# Patient Record
Sex: Female | Born: 1940 | Race: White | Hispanic: No | Marital: Married | Smoking: Never smoker
Health system: Southern US, Community
[De-identification: ages and names within clinical notes are randomized; demographics above are authoritative.]

## PROBLEM LIST (undated history)

## (undated) DIAGNOSIS — E7800 Pure hypercholesterolemia, unspecified: Secondary | ICD-10-CM

## (undated) DIAGNOSIS — E785 Hyperlipidemia, unspecified: Secondary | ICD-10-CM

## (undated) DIAGNOSIS — N809 Endometriosis, unspecified: Secondary | ICD-10-CM

## (undated) DIAGNOSIS — E039 Hypothyroidism, unspecified: Secondary | ICD-10-CM

## (undated) DIAGNOSIS — L97909 Non-pressure chronic ulcer of unspecified part of unspecified lower leg with unspecified severity: Secondary | ICD-10-CM

## (undated) DIAGNOSIS — I83009 Varicose veins of unspecified lower extremity with ulcer of unspecified site: Secondary | ICD-10-CM

## (undated) DIAGNOSIS — M549 Dorsalgia, unspecified: Secondary | ICD-10-CM

## (undated) HISTORY — DX: Pure hypercholesterolemia, unspecified: E78.00

## (undated) HISTORY — PX: HERNIA REPAIR: SHX51

## (undated) HISTORY — DX: Dorsalgia, unspecified: M54.9

## (undated) HISTORY — PX: ROTATOR CUFF REPAIR: SHX139

## (undated) HISTORY — DX: Endometriosis, unspecified: N80.9

## (undated) HISTORY — PX: APPENDECTOMY: SHX54

## (undated) HISTORY — DX: Hypothyroidism, unspecified: E03.9

## (undated) HISTORY — PX: THYROIDECTOMY: SHX17

## (undated) HISTORY — DX: Varicose veins of unspecified lower extremity with ulcer of unspecified site: I83.009

## (undated) HISTORY — PX: HEMORRHOID SURGERY: SHX153

## (undated) HISTORY — DX: Hyperlipidemia, unspecified: E78.5

## (undated) HISTORY — PX: CYSTOSCOPY: SUR368

## (undated) HISTORY — PX: ABDOMINAL HYSTERECTOMY: SHX81

## (undated) HISTORY — DX: Non-pressure chronic ulcer of unspecified part of unspecified lower leg with unspecified severity: L97.909

---

## 1997-11-18 DIAGNOSIS — I83009 Varicose veins of unspecified lower extremity with ulcer of unspecified site: Secondary | ICD-10-CM

## 1997-11-18 HISTORY — DX: Varicose veins of unspecified lower extremity with ulcer of unspecified site: I83.009

## 1997-12-27 ENCOUNTER — Encounter: Payer: Self-pay | Admitting: Family Medicine

## 1997-12-27 LAB — CONVERTED CEMR LAB: WBC, blood: 5.9 10*3/uL

## 1999-07-19 LAB — HM MAMMOGRAPHY: HM Mammogram: NORMAL

## 2001-07-20 ENCOUNTER — Encounter: Payer: Self-pay | Admitting: Family Medicine

## 2001-08-06 ENCOUNTER — Encounter: Payer: Self-pay | Admitting: Family Medicine

## 2003-07-21 ENCOUNTER — Encounter: Payer: Self-pay | Admitting: Family Medicine

## 2003-07-21 LAB — CONVERTED CEMR LAB: Blood Glucose, Fasting: 66 mg/dL

## 2004-03-11 ENCOUNTER — Ambulatory Visit: Payer: Self-pay | Admitting: Family Medicine

## 2004-03-12 ENCOUNTER — Encounter: Payer: Self-pay | Admitting: Family Medicine

## 2004-03-12 LAB — CONVERTED CEMR LAB
RBC count: 3.89 10*6/uL
TSH: 0.75 microintl units/mL
WBC, blood: 5.1 10*3/uL

## 2004-11-13 ENCOUNTER — Ambulatory Visit: Payer: Self-pay | Admitting: Family Medicine

## 2005-10-19 ENCOUNTER — Ambulatory Visit: Payer: Self-pay | Admitting: Family Medicine

## 2006-02-11 ENCOUNTER — Ambulatory Visit: Payer: Self-pay | Admitting: Family Medicine

## 2006-02-25 ENCOUNTER — Ambulatory Visit: Payer: Self-pay | Admitting: Family Medicine

## 2007-09-04 ENCOUNTER — Telehealth: Payer: Self-pay | Admitting: Family Medicine

## 2007-09-08 ENCOUNTER — Encounter: Payer: Self-pay | Admitting: Family Medicine

## 2007-09-08 DIAGNOSIS — E890 Postprocedural hypothyroidism: Secondary | ICD-10-CM

## 2007-09-08 DIAGNOSIS — I872 Venous insufficiency (chronic) (peripheral): Secondary | ICD-10-CM | POA: Insufficient documentation

## 2007-09-11 DIAGNOSIS — G43909 Migraine, unspecified, not intractable, without status migrainosus: Secondary | ICD-10-CM

## 2007-09-11 DIAGNOSIS — E7800 Pure hypercholesterolemia, unspecified: Secondary | ICD-10-CM

## 2007-09-12 ENCOUNTER — Ambulatory Visit: Payer: Self-pay | Admitting: Family Medicine

## 2007-10-23 ENCOUNTER — Ambulatory Visit: Payer: Self-pay | Admitting: Family Medicine

## 2008-01-24 ENCOUNTER — Telehealth: Payer: Self-pay | Admitting: Family Medicine

## 2008-01-26 ENCOUNTER — Telehealth: Payer: Self-pay | Admitting: Family Medicine

## 2009-01-06 ENCOUNTER — Telehealth: Payer: Self-pay | Admitting: Family Medicine

## 2009-08-20 ENCOUNTER — Encounter (INDEPENDENT_AMBULATORY_CARE_PROVIDER_SITE_OTHER): Payer: Self-pay | Admitting: *Deleted

## 2009-11-06 ENCOUNTER — Ambulatory Visit: Payer: Self-pay | Admitting: Internal Medicine

## 2009-11-14 ENCOUNTER — Ambulatory Visit: Payer: Self-pay | Admitting: Internal Medicine

## 2009-12-31 ENCOUNTER — Ambulatory Visit: Payer: Self-pay | Admitting: Family Medicine

## 2010-02-18 NOTE — Letter (Signed)
Summary: [name] letter  [facility]  [address] [place], [place] [address]   Phone: [phone]  Fax: [phone]       08/20/2009 MRN: [id number]  [name] [address] [place], [place]  [address]  Dear Ms. [facility] [place], and [place] announce the retirement of Arta Silence, M.D., from full-time practice at the [facility] office effective July 17, 2009 and his plans of returning part-time.  It is important to Dr. Hetty Ely and to our practice that you understand that [facility] has seven physicians in our office for your health care needs.  We will continue to offer the same exceptional care that you have today.    Dr. Hetty Ely has spoken to many of you about his plans for retirement and returning part-time in the fall.   We will continue to work with you through the transition to schedule appointments for you in the office and meet the high standards that [place] is committed to.   Again, it is with great pleasure that we share the news that Dr. Hetty Ely will return to [facility] in October of 2011 with a reduced schedule.    If you have any questions, or would like to request an appointment with one of our physicians, please call us at [phone] and press the option for Scheduling an appointment.  We take pleasure in providing you with excellent patient care and look forward to seeing you at your next office visit.  Our [facility] Physicians are:  Tillman Abide, M.D. Laurita Quint, M.D. Roxy Manns, M.D. Kerby Nora, M.D. Hannah Beat, M.D. Ruthe Mannan, M.D. We proudly welcomed Raechel Ache, M.D. and Eustaquio Boyden, M.D. to the practice in July/August 2011.  Sincerely,   [facility]

## 2010-02-19 NOTE — Assessment & Plan Note (Signed)
Summary: LEFT ARM PAIN / LFW   Vital Signs:  Patient profile:   70 year old female Weight:      192.25 pounds BMI:     29.12 Temp:     97.1 degrees F oral Pulse rate:   72 / minute Pulse rhythm:   regular BP sitting:   126 / 70  (left arm) Cuff size:   regular  Vitals Entered By: Sydell Axon LPN (December 31, 2009 11:21 AM) CC: Left arm pain X  2-3 weeks   History of Present Illness: Pt here for shoulder probs.  She sees Dr Barnabas Lister in Jul for her physcal...he checks her cholesterol and thyroid. Her lab paperwork is at home. Dr Barnabas Lister prescribes all of her medication. Her left arm has hurt in the bicep area for about two weeks. She denies trauma or injury but has a resolving bruise on her left tricep area from hitting the bureau at night in the dark.  Problems Prior to Update: 1)  Back Strain, Left Lat Dorsi  (ICD-847.9) 2)  Common Migraine  (ICD-346.10) 3)  Unspecified Venous Insufficiency  (ICD-459.81) 4)  Hypercholesterolemia  (ICD-272.0) 5)  Hypothyroidism  (ICD-244.9)  Medications Prior to Update: 1)  Cyclobenzaprine Hcl 10 Mg  Tabs (Cyclobenzaprine Hcl) .[name].. 1 Tablet Three Times A Day As Need By Mouth 2)  Diclofenac Sodium 50 Mg  Tbec (Diclofenac Sodium) .[name].. 1 Tablet Twice A Day By Mouth 3)  Synthroid 125 Mcg Tabs (Levothyroxine Sodium) .[name].. 1 Daily By Mouth 4)  Multivitamins  Tabs (Multiple Vitamin) .[name].. 1 Daily By Mouth 5)  Atenolol 50 Mg Tabs (Atenolol) .[name].. 1 Daily By Mouth 6)  Simvastatin 80 Mg Tabs (Simvastatin) .[name].. 1 Daily At Bedtime By Mouth  Current Medications (verified): 1)  Cyclobenzaprine Hcl 10 Mg  Tabs (Cyclobenzaprine Hcl) .[name].. 1 Tablet Three Times A Day As Need By Mouth 2)  Diclofenac Sodium 50 Mg  Tbec (Diclofenac Sodium) .[name].. 1 Tablet Twice A Day By Mouth 3)  Synthroid 125 Mcg Tabs (Levothyroxine Sodium) .[name].. 1 Daily By Mouth 4)  Multivitamins  Tabs (Multiple Vitamin) .[name].. 1 Daily By Mouth 5)  Atenolol 50 Mg Tabs (Atenolol) .[name].. 1 Daily By  Mouth 6)  Simvastatin 80 Mg Tabs (Simvastatin) .[name].. 1 Daily At Bedtime By Mouth 7)  Aspirin 81 Mg Tbec (Aspirin) .... Take One By Mouth Daily  Allergies: 1)  ! * Codeine  Physical Exam  General:  Well-developed,well-nourished,in no acute distress; alert,appropriate and cooperative throughout examination, nontoxic. Head:  Normocephalic and atraumatic without obvious abnormalities. No apparent alopecia or balding. Eyes:  Conjunctiva clear bilaterally.  Ears:  External ear exam shows no significant lesions or deformities.  Otoscopic examination reveals clear canals, tympanic membranes are intact bilaterally without bulging, retraction, inflammation or discharge. Hearing is grossly normal bilaterally. Nose:  External nasal examination shows no deformity or inflammation. Nasal mucosa are pink and moist without lesions or exudates. Mouth:  Oral mucosa and oropharynx without lesions or exudates.  Teeth in good repair. Neck:  No deformities, masses, or tenderness noted. Lungs:  Normal respiratory effort, chest expands symmetrically. Lungs are clear to auscultation, no crackles or wheezes. Heart:  Normal rate and regular rhythm. S1 and S2 normal without gallop, murmur, click, rub or other extra sounds. Extremities:  No clubbing, cyanosis, edema, or deformity noted with normal full range of motion of all joints except left arm hurts with posterior adduction and "hip pocket"maneuver..   Neurologic:  Sensation and light touch nml of left arm.  Impression & Recommendations:  Problem # 1:  PAIN IN SOFT TISSUES OF L UPPER ARM (ICD-729.5) Assessment New Appeaqrs to be acute muscle starin of distal fibers of left bicep. See instructions.  Complete Medication List: 1)  Cyclobenzaprine Hcl 10 Mg Tabs (Cyclobenzaprine hcl) .[name].. 1 tablet three times a day as need by mouth 2)  Diclofenac Sodium 50 Mg Tbec (Diclofenac sodium) .[name].. 1 tablet twice a day by mouth 3)  Synthroid 125 Mcg Tabs (Levothyroxine  sodium) .[name].. 1 daily by mouth 4)  Multivitamins Tabs (Multiple vitamin) .[name].. 1 daily by mouth 5)  Atenolol 50 Mg Tabs (Atenolol) .[name].. 1 daily by mouth 6)  Simvastatin 80 Mg Tabs (Simvastatin) .[name].. 1 daily at bedtime by mouth 7)  Aspirin 81 Mg Tbec (Aspirin) .... Take one by mouth daily  Patient Instructions: 1)  Take Aleve 2 tabs after brfst and supper for about two weeks. 2)  may also take Tyl as needed.  3)  Heaqt in AM and throughout the day as able. Ice before bedtime. 4)  RTC if sxs worsen.   Orders Added: 1)  Est. Patient Level III [91478]    Current Allergies (reviewed today): ! * CODEINE

## 2010-03-15 ENCOUNTER — Ambulatory Visit: Payer: Self-pay | Admitting: Specialist

## 2010-03-20 ENCOUNTER — Encounter: Payer: Self-pay | Admitting: Family Medicine

## 2010-03-31 NOTE — Letter (Signed)
Summary: [facility]   Imported By: Maryln Gottron 03/26/2010 15:24:30  _____________________________________________________________________  External Attachment:    Type:   Image     Comment:   External Document

## 2010-04-28 ENCOUNTER — Ambulatory Visit: Payer: Self-pay | Admitting: Specialist

## 2010-05-07 ENCOUNTER — Ambulatory Visit: Payer: Self-pay | Admitting: Specialist

## 2010-08-14 ENCOUNTER — Other Ambulatory Visit: Payer: Self-pay | Admitting: *Deleted

## 2010-08-14 MED ORDER — BUTALBITAL-APAP-CAFFEINE 50-325-40 MG PO TABS: ORAL_TABLET | ORAL | Status: DC

## 2010-08-14 NOTE — Telephone Encounter (Signed)
Rx called to pharmacy

## 2010-08-14 NOTE — Telephone Encounter (Signed)
Patient has a new patient appt on 08/31/2010 with Dr. Dayton Martes.  Pharmacy requesting a refill until her appt.  Please advise.

## 2010-08-14 NOTE — Telephone Encounter (Signed)
Ok to refill one month supply. 

## 2010-08-17 ENCOUNTER — Other Ambulatory Visit: Payer: Self-pay | Admitting: Family Medicine

## 2010-08-17 DIAGNOSIS — Z0000 Encounter for general adult medical examination without abnormal findings: Secondary | ICD-10-CM

## 2010-08-17 DIAGNOSIS — E7800 Pure hypercholesterolemia, unspecified: Secondary | ICD-10-CM

## 2010-08-17 DIAGNOSIS — E039 Hypothyroidism, unspecified: Secondary | ICD-10-CM

## 2010-08-17 DIAGNOSIS — Z79899 Other long term (current) drug therapy: Secondary | ICD-10-CM

## 2010-08-25 ENCOUNTER — Encounter: Payer: Self-pay | Admitting: Family Medicine

## 2010-08-25 ENCOUNTER — Other Ambulatory Visit (INDEPENDENT_AMBULATORY_CARE_PROVIDER_SITE_OTHER): Payer: Medicare Other | Admitting: Family Medicine

## 2010-08-25 DIAGNOSIS — E7800 Pure hypercholesterolemia, unspecified: Secondary | ICD-10-CM

## 2010-08-25 DIAGNOSIS — Z0000 Encounter for general adult medical examination without abnormal findings: Secondary | ICD-10-CM

## 2010-08-25 DIAGNOSIS — Z79899 Other long term (current) drug therapy: Secondary | ICD-10-CM

## 2010-08-25 DIAGNOSIS — F1910 Other psychoactive substance abuse, uncomplicated: Secondary | ICD-10-CM

## 2010-08-25 DIAGNOSIS — E039 Hypothyroidism, unspecified: Secondary | ICD-10-CM

## 2010-08-25 LAB — BASIC METABOLIC PANEL
Calcium: 8.7 mg/dL (ref 8.4–10.5)
Creatinine, Ser: 0.8 mg/dL (ref 0.4–1.2)
GFR: 81.22 mL/min (ref >60.00)
Sodium: 140 mEq/L (ref 135–145)

## 2010-08-25 LAB — HEPATIC FUNCTION PANEL
ALT: 35 U/L (ref 0–35)
AST: 37 U/L (ref 0–37)
Albumin: 4 g/dL (ref 3.5–5.2)
Alkaline Phosphatase: 89 U/L (ref 39–117)
Total Protein: 7.3 g/dL (ref 6.0–8.3)

## 2010-08-25 LAB — LIPID PANEL
Cholesterol: 142 mg/dL (ref 0–200)
HDL: 56.6 mg/dL (ref >39.00)
Triglycerides: 151 mg/dL — ABNORMAL HIGH (ref 0.0–149.0)

## 2010-08-31 ENCOUNTER — Encounter: Payer: Self-pay | Admitting: Family Medicine

## 2010-08-31 ENCOUNTER — Ambulatory Visit: Payer: Self-pay | Admitting: Family Medicine

## 2010-08-31 ENCOUNTER — Ambulatory Visit (INDEPENDENT_AMBULATORY_CARE_PROVIDER_SITE_OTHER): Payer: Medicare Other | Admitting: Family Medicine

## 2010-08-31 VITALS — BP 130/90 | HR 72 | Temp 97.7°F | Ht 68.0 in | Wt 185.5 lb

## 2010-08-31 DIAGNOSIS — R300 Dysuria: Secondary | ICD-10-CM

## 2010-08-31 DIAGNOSIS — Z0100 Encounter for examination of eyes and vision without abnormal findings: Secondary | ICD-10-CM

## 2010-08-31 DIAGNOSIS — Z1231 Encounter for screening mammogram for malignant neoplasm of breast: Secondary | ICD-10-CM

## 2010-08-31 DIAGNOSIS — Z0110 Encounter for examination of ears and hearing without abnormal findings: Secondary | ICD-10-CM

## 2010-08-31 DIAGNOSIS — Z0000 Encounter for general adult medical examination without abnormal findings: Secondary | ICD-10-CM

## 2010-08-31 DIAGNOSIS — E785 Hyperlipidemia, unspecified: Secondary | ICD-10-CM

## 2010-08-31 DIAGNOSIS — E039 Hypothyroidism, unspecified: Secondary | ICD-10-CM

## 2010-08-31 DIAGNOSIS — E7800 Pure hypercholesterolemia, unspecified: Secondary | ICD-10-CM

## 2010-08-31 LAB — POCT URINALYSIS DIPSTICK
Bilirubin, UA: NEGATIVE
Blood, UA: NEGATIVE
Glucose, UA: NEGATIVE
Leukocytes, UA: NEGATIVE
Nitrite, UA: NEGATIVE

## 2010-08-31 MED ORDER — SIMVASTATIN 40 MG PO TABS: 40.0000 mg | ORAL_TABLET | Freq: Every day | ORAL | Status: DC

## 2010-08-31 MED ORDER — CYCLOBENZAPRINE HCL 5 MG PO TABS: 5.0000 mg | ORAL_TABLET | Freq: Three times a day (TID) | ORAL | Status: DC | PRN

## 2010-08-31 NOTE — Progress Notes (Signed)
Addended by: Dianne Dun on: 08/31/2010 11:27 AM   Modules accepted: Orders

## 2010-08-31 NOTE — Progress Notes (Signed)
Subjective:    Patient ID: [name], female    DOB: May 29, 1940, 70 y.o.   MRN: [id number]  HPI  Pt of Dr. Lorenza Chick here to establish care and for medicare AWV.  Only complaint is some myaglias fatigue with increased dose of Zocor 80 mg daily. LDL very low at 55.  I have personally reviewed the Medicare Annual Wellness questionnaire and have noted 1. The patient's medical and social history 2. Their use of alcohol, tobacco or illicit drugs 3. Their current medications and supplements 4. The patient's functional ability including ADL's, fall risks, home safety risks and hearing or visual             impairment. 5. Diet and physical activities 6. Evidence for depression or mood disorders  Patient Active Problem List  Diagnoses  . HYPOTHYROIDISM  . HYPERCHOLESTEROLEMIA  . COMMON MIGRAINE  . UNSPECIFIED VENOUS INSUFFICIENCY  . Routine general medical examination at a health care facility   Past Medical History  Diagnosis Date  . Venous stasis ulcer 11/1997    Wound care center, [place]   Past Surgical History  Procedure Date  . Abdominal hysterectomy     1 1/2 ovaries gone, second to endometriosis  . Appendectomy   . Thyroidectomy    History  Substance Use Topics  . Smoking status: Not on file  . Smokeless tobacco: Not on file  . Alcohol Use:    Family History  Problem Relation Age of Onset  . Stroke Mother   . Cancer Sister     stomach   Allergies  Allergen Reactions  . Codeine     REACTION: NAUSEA AND VOMITING   Current Outpatient Prescriptions on File Prior to Visit  Medication Sig Dispense Refill  . aspirin 81 MG tablet Take 81 mg by mouth daily.        [name] atenolol (TENORMIN) 50 MG tablet Take 50 mg by mouth daily.        . butalbital-acetaminophen-caffeine (FIORICET) 50-325-40 MG per tablet Take one tablet by mouth every 4 hours as needed.  Do not exceed 6 tablets per day  20 tablet  0  . levothyroxine (SYNTHROID, LEVOTHROID) 125 MCG tablet  Take 125 mcg by mouth daily.        . Multiple Vitamin (MULTIVITAMIN) tablet Take 1 tablet by mouth daily.        . simvastatin (ZOCOR) 80 MG tablet Take 80 mg by mouth at bedtime.         The PMH, PSH, Social History, Family History, Medications, and allergies have been reviewed in [facility], and have been updated if relevant.    Review of Systems See HPI Patient reports no  vision/ hearing changes,anorexia, weight change, fever ,adenopathy, persistant / recurrent hoarseness, swallowing issues, chest pain, edema,persistant / recurrent cough, hemoptysis, dyspnea(rest, exertional, paroxysmal nocturnal), gastrointestinal  bleeding (melena, rectal bleeding), abdominal pain, excessive heart burn, GU symptoms(dysuria, hematuria, pyuria, voiding/incontinence  Issues) syncope, focal weakness, severe memory loss, concerning skin lesions, depression, anxiety, abnormal bruising/bleeding, major joint swelling, breast masses or abnormal vaginal bleeding.       Objective:   Physical Exam BP 130/90  Pulse 72  Temp(Src) 97.7 F (36.5 C) (Oral)  Ht 5\' 8"  (1.727 m)  Wt 185 lb 8 oz (84.142 kg)  BMI 28.21 kg/m2   General:  Well-developed,well-nourished,in no acute distress; alert,appropriate and cooperative throughout examination Head:  normocephalic and atraumatic.   Eyes:  vision grossly intact, pupils equal, pupils round, and pupils reactive to  light.   Ears:  R ear normal and L ear normal.   Nose:  no external deformity.   Mouth:  good dentition.   Neck:  No deformities, masses, or tenderness noted. Lungs:  Normal respiratory effort, chest expands symmetrically. Lungs are clear to auscultation, no crackles or wheezes. Heart:  Normal rate and regular rhythm. S1 and S2 normal without gallop, murmur, click, rub or other extra sounds. Abdomen:  Bowel sounds positive,abdomen soft and non-tender without masses, organomegaly or hernias noted. Msk:  No deformity or scoliosis noted of thoracic or lumbar spine.     Extremities:  No clubbing, cyanosis, edema, or deformity noted with normal full range of motion of all joints.   Neurologic:  alert & oriented X3 and gait normal.   Skin:  Intact without suspicious lesions or rashes Cervical Nodes:  No lymphadenopathy noted Axillary Nodes:  No palpable lymphadenopathy Psych:  Cognition and judgment appear intact. Alert and cooperative with normal attention span and concentration. No apparent delusions, illusions, hallucinations       Assessment & Plan:   1. Routine general medical examination at a health care facility  The patients weight, height, BMI and visual acuity have been recorded in the chart I have made referrals, counseling and provided education to the patient based review of the above and I have provided the pt with a written personalized care plan for preventive services.  IFOB ordered today.  Orders Placed This Encounter  Procedures  . MM Digital Screening    Standing Status: Future     Number of Occurrences:      Standing Expiration Date: 10/31/2011    Order Specific Question:  Reason for exam:    Answer:  screening mammogram    Order Specific Question:  Preferred imaging location?    Answer:  External     Comments:  [facility]  . POCT urinalysis dipstick      2. HYPERCHOLESTEROLEMIA   LDL quite low and pt complaining of myalgias. Will d/c Zocor 80 mg (cost is also an issue) and place on Simvastatin 40 mg daily. Pt to repeat  Labs in 3-6 months. Discussed elevated TG and glucose.  See pt instructions for details.  3. HYPOTHYROIDISM  Stable on current dose of Synthroid.

## 2010-08-31 NOTE — Patient Instructions (Addendum)
Great to see you. Please stop taking the Zocor, start taking Simvastatin 40 mg daily.  Please stop by to see Shirlee Limerick on your way out.  Triglycerides are a little high.  Decrease added sugars, eliminate trans fats, increase fiber and limit alcohol.  All these changes together can drop triglycerides by almost 50%.

## 2010-09-03 ENCOUNTER — Other Ambulatory Visit: Payer: Medicare Other

## 2010-09-07 ENCOUNTER — Other Ambulatory Visit: Payer: Self-pay | Admitting: Family Medicine

## 2010-09-07 ENCOUNTER — Encounter: Payer: Self-pay | Admitting: Gastroenterology

## 2010-09-07 DIAGNOSIS — Z1211 Encounter for screening for malignant neoplasm of colon: Secondary | ICD-10-CM

## 2010-09-07 DIAGNOSIS — R195 Other fecal abnormalities: Secondary | ICD-10-CM | POA: Insufficient documentation

## 2010-09-28 ENCOUNTER — Encounter: Payer: Self-pay | Admitting: Family Medicine

## 2010-09-29 ENCOUNTER — Encounter: Payer: Self-pay | Admitting: *Deleted

## 2010-09-30 ENCOUNTER — Encounter: Payer: Self-pay | Admitting: Gastroenterology

## 2010-09-30 ENCOUNTER — Ambulatory Visit (INDEPENDENT_AMBULATORY_CARE_PROVIDER_SITE_OTHER): Payer: Medicare Other | Admitting: Gastroenterology

## 2010-09-30 VITALS — BP 130/70 | HR 72 | Ht 68.0 in | Wt 186.8 lb

## 2010-09-30 DIAGNOSIS — K5900 Constipation, unspecified: Secondary | ICD-10-CM

## 2010-09-30 DIAGNOSIS — R195 Other fecal abnormalities: Secondary | ICD-10-CM

## 2010-09-30 MED ORDER — NA SULFATE-K SULFATE-MG SULF 17.5-3.13-1.6 GM/177ML PO SOLN: ORAL | Status: DC

## 2010-09-30 NOTE — Progress Notes (Signed)
History of Present Illness: This is a 70 year old female here today with her husband. She has problems with chronic constipation that is generally well controlled on MiraLax. She was recently found to have Hemoccult-positive stools and she is referred for further evaluation. She previously underwent colonoscopy by Dr. Maryruth Bun at [facility] [place] in August 2003 and that procedure was normal. Denies weight loss, abdominal pain, diarrhea, change in stool caliber, melena, hematochezia, nausea, vomiting, dysphagia, reflux symptoms, chest pain.  Past Medical History  Diagnosis Date  . Venous stasis ulcer 11/1997    Wound care center, [place]  . Hypothyroidism   . Hypercholesteremia   . Back pain   . Migraine    Past Surgical History  Procedure Date  . Abdominal hysterectomy     1 1/2 ovaries gone, second to endometriosis  . Appendectomy   . Thyroidectomy   . Hernia repair   . Rotator cuff repair     reports that she has never smoked. She does not have any smokeless tobacco history on file. She reports that she does not drink alcohol or use illicit drugs. family history includes Heart attack in her brother and father; Stomach cancer in her sister; and Stroke in her mother.  There is no history of Colon cancer. Allergies  Allergen Reactions  . Codeine     REACTION: NAUSEA AND VOMITING   Outpatient Encounter Prescriptions as of 09/30/2010  Medication Sig Dispense Refill  . aspirin 81 MG tablet Take 81 mg by mouth daily.        [name] atenolol (TENORMIN) 50 MG tablet Take 50 mg by mouth daily.        . butalbital-acetaminophen-caffeine (FIORICET) 50-325-40 MG per tablet Take one tablet by mouth every 4 hours as needed.  Do not exceed 6 tablets per day  20 tablet  0  . cyclobenzaprine (FLEXERIL) 5 MG tablet Take 1 tablet (5 mg total) by mouth every 8 (eight) hours as needed for muscle spasms.  30 tablet  1  . docusate sodium (COLACE) 100 MG capsule Take 100 mg by mouth daily.        [name] levothyroxine (SYNTHROID,  LEVOTHROID) 125 MCG tablet Take 125 mcg by mouth daily.        . Multiple Vitamin (MULTIVITAMIN) tablet Take 1 tablet by mouth daily.        . polyethylene glycol powder (MIRALAX) powder Take 17 g by mouth every other day.        . simvastatin (ZOCOR) 40 MG tablet Take 1 tablet (40 mg total) by mouth at bedtime.  90 tablet  3  . SUMAtriptan (IMITREX) 20 MG/ACT nasal spray Place 1 spray into the nose as needed.        . Na Sulfate-K Sulfate-Mg Sulf SOLN Use as directed  1 Bottle  0   Review of Systems: Pertinent positive and negative review of systems were noted in the above HPI section. All other review of systems were otherwise negative.  Physical Exam: General: Well developed , well nourished, no acute distress Head: Normocephalic and atraumatic Eyes:  sclerae anicteric, EOMI Ears: Normal auditory acuity Mouth: No deformity or lesions Neck: Supple, no masses or thyromegaly Lungs: Clear throughout to auscultation Heart: Regular rate and rhythm; no murmurs, rubs or bruits Abdomen: Soft, non tender and non distended. No masses, hepatosplenomegaly or hernias noted. Normal Bowel sounds Rectal: Deferred to colonoscopy Musculoskeletal: Symmetrical with no gross deformities  Skin: No lesions on visible extremities Pulses:  Normal pulses noted Extremities: No clubbing,  cyanosis, edema or deformities noted Neurological: Alert oriented x 4, grossly nonfocal Cervical Nodes:  No significant cervical adenopathy Inguinal Nodes: No significant inguinal adenopathy Psychological:  Alert and cooperative. Normal mood and affect  Assessment and Recommendations:  1. Hemoccult-positive stool. Rule out colorectal neoplasms. The risks, benefits, and alternatives to colonoscopy with possible biopsy and possible polypectomy were discussed with the patient and they consent to proceed. Given her sedation requirements at her prior colonoscopy and per her request will proceed with propofol sedation.  2. Chronic  constipation. Long-term high fiber diet with increased daily water intake. May use MiraLax 1-3 times each day titrated for adequate bowel movements. May continue to use Colace daily if it is needed in addition to MiraLax.

## 2010-09-30 NOTE — Patient Instructions (Signed)
You have been scheduled for a colonoscopy. Please follow written instructions given to you at your visit today.  Please pick up your Suprep kit at the pharmacy within the next 2-3 days. CC: Dr Ruthe Mannan

## 2010-10-20 ENCOUNTER — Ambulatory Visit (AMBULATORY_SURGERY_CENTER): Payer: Medicare Other | Admitting: Gastroenterology

## 2010-10-20 ENCOUNTER — Encounter: Payer: Self-pay | Admitting: Gastroenterology

## 2010-10-20 DIAGNOSIS — R195 Other fecal abnormalities: Secondary | ICD-10-CM

## 2010-10-20 DIAGNOSIS — K5900 Constipation, unspecified: Secondary | ICD-10-CM

## 2010-10-20 MED ORDER — SODIUM CHLORIDE 0.9 % IV SOLN: 500.0000 mL | INTRAVENOUS | Status: DC

## 2010-10-20 NOTE — Patient Instructions (Signed)
Green and blue discharge instructions reviewed with patient and care partner.  Impressions/recommendations:  Normal colon  Repeat colonoscopy exam in 10 years.  Resume medications as you were taking them prior to your procedure.

## 2010-10-21 ENCOUNTER — Telehealth: Payer: Self-pay | Admitting: *Deleted

## 2010-10-21 NOTE — Telephone Encounter (Signed)
Follow up Call- Patient questions:  Do you have a fever, pain , or abdominal swelling? yes Pain Score  2 *  Have you tolerated food without any problems? yes  Have you been able to return to your normal activities? yes  Do you have any questions about your discharge instructions: Diet   no Medications  no Follow up visit  no  Do you have questions or concerns about your Care? no  Actions: * If pain score is 4 or above: No action needed, pain <4. Patient complained of "gas" last night and again this morning with passage of air and relief. Pain below a 4. Instructed to call with any  Concerns, worsening pain or questions.

## 2010-10-26 ENCOUNTER — Other Ambulatory Visit: Payer: Self-pay | Admitting: *Deleted

## 2010-10-26 NOTE — Telephone Encounter (Signed)
Pt is requesting a refill on fioricet, previously prescribed by Dr. Barnabas Lister.  Uses [facility].

## 2010-10-27 MED ORDER — BUTALBITAL-APAP-CAFFEINE 50-325-40 MG PO TABS: ORAL_TABLET | ORAL | Status: DC

## 2010-10-27 NOTE — Telephone Encounter (Signed)
Rx called to [facility], patient notified via telephone.

## 2010-11-04 ENCOUNTER — Other Ambulatory Visit: Payer: Medicare Other | Admitting: Gastroenterology

## 2010-11-26 ENCOUNTER — Encounter: Payer: Self-pay | Admitting: Family Medicine

## 2010-11-26 ENCOUNTER — Ambulatory Visit (INDEPENDENT_AMBULATORY_CARE_PROVIDER_SITE_OTHER): Payer: Medicare Other | Admitting: Family Medicine

## 2010-11-26 VITALS — BP 130/80 | HR 60 | Temp 97.7°F | Ht 68.0 in | Wt 191.0 lb

## 2010-11-26 DIAGNOSIS — J069 Acute upper respiratory infection, unspecified: Secondary | ICD-10-CM

## 2010-11-26 MED ORDER — CYCLOBENZAPRINE HCL 5 MG PO TABS: 5.0000 mg | ORAL_TABLET | Freq: Three times a day (TID) | ORAL | Status: AC | PRN

## 2010-11-26 NOTE — Progress Notes (Signed)
SUBJECTIVE:  [name] is a 70 y.o. female who complains of coryza, congestion, sneezing, sore throat and dry cough for 2 days. She denies a history of anorexia, chest pain, fatigue, fevers, myalgias, nausea and shortness of breath and denies a history of asthma. Patient denies smoke cigarettes.   Patient Active Problem List  Diagnoses  . HYPOTHYROIDISM  . HYPERCHOLESTEROLEMIA  . COMMON MIGRAINE  . UNSPECIFIED VENOUS INSUFFICIENCY  . Occult blood in stools   Past Medical History  Diagnosis Date  . Venous stasis ulcer 11/1997    Wound care center, [place]  . Hypothyroidism   . Hypercholesteremia   . Back pain   . Migraine    Past Surgical History  Procedure Date  . Abdominal hysterectomy     1 1/2 ovaries gone, second to endometriosis  . Appendectomy   . Thyroidectomy   . Hernia repair   . Rotator cuff repair   . Cystoscopy    History  Substance Use Topics  . Smoking status: Never Smoker   . Smokeless tobacco: Not on file  . Alcohol Use: No   Family History  Problem Relation Age of Onset  . Stroke Mother   . Stomach cancer Sister   . Heart attack Father   . Heart attack Brother   . Colon cancer Neg Hx   . Esophageal cancer Neg Hx    Allergies  Allergen Reactions  . Codeine     REACTION: NAUSEA AND VOMITING   Current Outpatient Prescriptions on File Prior to Visit  Medication Sig Dispense Refill  . aspirin 81 MG tablet Take 81 mg by mouth daily.        [name] atenolol (TENORMIN) 50 MG tablet Take 50 mg by mouth daily.        . butalbital-acetaminophen-caffeine (FIORICET) 50-325-40 MG per tablet Take one tablet by mouth every 4 hours as needed.  Do not exceed 6 tablets per day  60 tablet  0  . CRESTOR 40 MG tablet 1 tablet Daily.      [name] estradiol (ESTRACE) 1 MG tablet 1 tablet Daily.      [name] levothyroxine (SYNTHROID, LEVOTHROID) 125 MCG tablet Take 125 mcg by mouth daily.        . Multiple Vitamin (MULTIVITAMIN) tablet Take 1 tablet by mouth daily.        .  polyethylene glycol powder (MIRALAX) powder Take 17 g by mouth every other day.        . SUMAtriptan (IMITREX) 20 MG/ACT nasal spray Place 1 spray into the nose as needed.         The PMH, PSH, Social History, Family History, Medications, and allergies have been reviewed in [facility], and have been updated if relevant.  OBJECTIVE: BP 130/80  Pulse 60  Temp(Src) 97.7 F (36.5 C) (Oral)  Ht 5\' 8"  (1.727 m)  Wt 191 lb (86.637 kg)  BMI 29.04 kg/m2  She appears well, vital signs are as noted. Ears normal.  Throat and pharynx normal.  Neck supple. No adenopathy in the neck. Nose is congested. Sinuses non tender. The chest is clear, without wheezes or rales.  ASSESSMENT:  viral upper respiratory illness  PLAN: Symptomatic therapy suggested: push fluids, rest and return office visit prn if symptoms persist or worsen. Lack of antibiotic effectiveness discussed with her. Call or return to clinic prn if these symptoms worsen or fail to improve as anticipated.

## 2010-11-26 NOTE — Patient Instructions (Signed)
Good to see you, [name]. I hope you feel better soon.  Drink lots of fluids.  Treat sympotmatically with Mucinex, nasal saline irrigation, and Tylenol/Ibuprofen.ou can use warm compresses.  Cough suppressant at night. Call if not improving as expected in 5-7 days.

## 2010-11-27 ENCOUNTER — Other Ambulatory Visit: Payer: Self-pay | Admitting: *Deleted

## 2010-11-27 MED ORDER — DICLOFENAC SODIUM 75 MG PO TBEC: 75.0000 mg | DELAYED_RELEASE_TABLET | Freq: Two times a day (BID) | ORAL | Status: AC

## 2010-11-27 NOTE — Telephone Encounter (Signed)
Sorry, I thought it was on her medlist. I just sent it in.  PLease tell her not to take aspirin while she is taking it.

## 2010-11-27 NOTE — Telephone Encounter (Signed)
What directions, how many, any refills?

## 2010-11-27 NOTE — Telephone Encounter (Signed)
Ok to refill 

## 2010-11-27 NOTE — Telephone Encounter (Signed)
Pt is asking for a refill on diclofenac 75 mg's to be sent to [facility].  She says she told you the wrong medicine yesterday.

## 2010-11-27 NOTE — Telephone Encounter (Signed)
Left message on machine at home advising patient as instructed.  Called cell number and recording said the wireless customer you are calling is unavailable, please try your call again later.

## 2011-03-19 ENCOUNTER — Ambulatory Visit: Payer: Medicare Other | Admitting: Family Medicine

## 2011-03-19 DIAGNOSIS — Z0289 Encounter for other administrative examinations: Secondary | ICD-10-CM

## 2011-03-22 ENCOUNTER — Ambulatory Visit: Payer: Medicare Other | Admitting: Family Medicine

## 2011-03-25 ENCOUNTER — Other Ambulatory Visit: Payer: Self-pay | Admitting: *Deleted

## 2011-03-25 MED ORDER — ATENOLOL 50 MG PO TABS: 50.0000 mg | ORAL_TABLET | Freq: Every day | ORAL | Status: DC

## 2011-03-29 ENCOUNTER — Other Ambulatory Visit: Payer: Self-pay | Admitting: Family Medicine

## 2011-04-12 ENCOUNTER — Ambulatory Visit (INDEPENDENT_AMBULATORY_CARE_PROVIDER_SITE_OTHER): Payer: Medicare Other | Admitting: Family Medicine

## 2011-04-12 ENCOUNTER — Encounter: Payer: Self-pay | Admitting: Family Medicine

## 2011-04-12 VITALS — BP 120/80 | HR 64 | Temp 97.9°F | Wt 193.0 lb

## 2011-04-12 DIAGNOSIS — K529 Noninfective gastroenteritis and colitis, unspecified: Secondary | ICD-10-CM

## 2011-04-12 DIAGNOSIS — K5289 Other specified noninfective gastroenteritis and colitis: Secondary | ICD-10-CM

## 2011-04-12 MED ORDER — ONDANSETRON HCL 4 MG PO TABS: 4.0000 mg | ORAL_TABLET | Freq: Three times a day (TID) | ORAL | Status: AC | PRN

## 2011-04-12 NOTE — Patient Instructions (Signed)

## 2011-04-12 NOTE — Progress Notes (Signed)
(  S) [name] is a 71 y.o. female with complaint of gastrointestinal symptoms of fevers, watery diarrhea, nausea, vomiting for 7 days. No blood in stool.  Patient Active Problem List  Diagnoses  . HYPOTHYROIDISM  . HYPERCHOLESTEROLEMIA  . COMMON MIGRAINE  . UNSPECIFIED VENOUS INSUFFICIENCY  . Occult blood in stools   Past Medical History  Diagnosis Date  . Venous stasis ulcer 11/1997    Wound care center, [place]  . Hypothyroidism   . Hypercholesteremia   . Back pain   . Migraine    Past Surgical History  Procedure Date  . Abdominal hysterectomy     1 1/2 ovaries gone, second to endometriosis  . Appendectomy   . Thyroidectomy   . Hernia repair   . Rotator cuff repair   . Cystoscopy    History  Substance Use Topics  . Smoking status: Never Smoker   . Smokeless tobacco: Not on file  . Alcohol Use: No   Family History  Problem Relation Age of Onset  . Stroke Mother   . Stomach cancer Sister   . Heart attack Father   . Heart attack Brother   . Colon cancer Neg Hx   . Esophageal cancer Neg Hx    Allergies  Allergen Reactions  . Codeine     REACTION: NAUSEA AND VOMITING   Current Outpatient Prescriptions on File Prior to Visit  Medication Sig Dispense Refill  . atenolol (TENORMIN) 50 MG tablet Take 1 tablet (50 mg total) by mouth daily.  90 tablet  3  . butalbital-acetaminophen-caffeine (FIORICET) 50-325-40 MG per tablet Take one tablet by mouth every 4 hours as needed.  Do not exceed 6 tablets per day  60 tablet  0  . cyclobenzaprine (FLEXERIL) 5 MG tablet Take 1 tablet (5 mg total) by mouth every 8 (eight) hours as needed for muscle spasms.  30 tablet  1  . diclofenac (VOLTAREN) 75 MG EC tablet Take 1 tablet (75 mg total) by mouth 2 (two) times daily with a meal.  60 tablet  2  . estradiol (ESTRACE) 1 MG tablet 1 tablet Daily.      [name] levothyroxine (SYNTHROID, LEVOTHROID) 125 MCG tablet TAKE 1 TABLET ( ) BY ORAL ROUTE ONCE DAILY  90 tablet  1  .  Multiple Vitamin (MULTIVITAMIN) tablet Take 1 tablet by mouth daily.        . polyethylene glycol powder (MIRALAX) powder Take 17 g by mouth every other day.        . SUMAtriptan (IMITREX) 20 MG/ACT nasal spray Place 1 spray into the nose as needed.        . CRESTOR 40 MG tablet 1 tablet Daily.       The PMH, PSH, Social History, Family History, Medications, and allergies have been reviewed in [facility], and have been updated if relevant.  (O)  BP 120/80  Pulse 64  Temp(Src) 97.9 F (36.6 C) (Oral)  Wt 193 lb (87.544 kg)  Physical exam reveals the patient appears well. Hydration status: well hydrated. Abdomen: abdomen is soft without significant tenderness, masses, organomegaly or guarding..  (A) Viral Gastroenteritis  (P) I have recommended small amounts clear fluids frequently, soups, juices, water and advance diet as tolerated. Return office visit if symptoms persist or worsen; I have alerted the patient to call if high fever, dehydration, marked weakness, fainting, increased abdominal pain, blood in stool or vomit.

## 2011-05-17 ENCOUNTER — Ambulatory Visit (INDEPENDENT_AMBULATORY_CARE_PROVIDER_SITE_OTHER)
Admission: RE | Admit: 2011-05-17 | Discharge: 2011-05-17 | Disposition: A | Discharge status: Home / Self Care | Payer: Medicare Other | Source: Ambulatory Visit | Attending: Family Medicine | Admitting: Family Medicine

## 2011-05-17 ENCOUNTER — Ambulatory Visit (INDEPENDENT_AMBULATORY_CARE_PROVIDER_SITE_OTHER): Payer: Medicare Other | Admitting: Family Medicine

## 2011-05-17 ENCOUNTER — Encounter: Payer: Self-pay | Admitting: Family Medicine

## 2011-05-17 VITALS — BP 150/84 | HR 64 | Temp 97.7°F | Wt 195.0 lb

## 2011-05-17 DIAGNOSIS — M549 Dorsalgia, unspecified: Secondary | ICD-10-CM

## 2011-05-17 NOTE — Patient Instructions (Signed)
Take tylenol 500mg  1-2 tabs three times a day for pain. Aleve 1-2 tabs twice a day with food Heat or ice 15 minutes at a time 3-4 times a day as needed to help with pain. I will call you with your xray results.

## 2011-05-17 NOTE — Progress Notes (Signed)
SUBJECTIVE:  [name] is a 71 y.o. female who complains of low back pain for 3 month(s), positional with bending or lifting, with radiation down the legs. Precipitating factors: none recalled by the patient. Prior history of back problems: recurrent self limited episodes of low back pain in the past. There is no numbness in the legs.  Patient Active Problem List  Diagnoses  . HYPOTHYROIDISM  . HYPERCHOLESTEROLEMIA  . COMMON MIGRAINE  . UNSPECIFIED VENOUS INSUFFICIENCY  . Occult blood in stools  . Back pain   Past Medical History  Diagnosis Date  . Venous stasis ulcer 11/1997    Wound care center, [place]  . Hypothyroidism   . Hypercholesteremia   . Back pain   . Migraine    Past Surgical History  Procedure Date  . Abdominal hysterectomy     1 1/2 ovaries gone, second to endometriosis  . Appendectomy   . Thyroidectomy   . Hernia repair   . Rotator cuff repair   . Cystoscopy    History  Substance Use Topics  . Smoking status: Never Smoker   . Smokeless tobacco: Not on file  . Alcohol Use: No   Family History  Problem Relation Age of Onset  . Stroke Mother   . Stomach cancer Sister   . Heart attack Father   . Heart attack Brother   . Colon cancer Neg Hx   . Esophageal cancer Neg Hx    Allergies  Allergen Reactions  . Codeine     REACTION: NAUSEA AND VOMITING   Current Outpatient Prescriptions on File Prior to Visit  Medication Sig Dispense Refill  . atenolol (TENORMIN) 50 MG tablet Take 1 tablet (50 mg total) by mouth daily.  90 tablet  3  . butalbital-acetaminophen-caffeine (FIORICET) 50-325-40 MG per tablet Take one tablet by mouth every 4 hours as needed.  Do not exceed 6 tablets per day  60 tablet  0  . CRESTOR 40 MG tablet 1 tablet Daily.      [name] estradiol (ESTRACE) 1 MG tablet 1 tablet Daily.      [name] levothyroxine (SYNTHROID, LEVOTHROID) 125 MCG tablet TAKE 1 TABLET ( ) BY ORAL ROUTE ONCE DAILY  90 tablet  1  . Multiple Vitamin (MULTIVITAMIN)  tablet Take 1 tablet by mouth daily.        . polyethylene glycol powder (MIRALAX) powder Take 17 g by mouth every other day.        . SUMAtriptan (IMITREX) 20 MG/ACT nasal spray Place 1 spray into the nose as needed.        . cyclobenzaprine (FLEXERIL) 5 MG tablet Take 1 tablet (5 mg total) by mouth every 8 (eight) hours as needed for muscle spasms.  30 tablet  1  . diclofenac (VOLTAREN) 75 MG EC tablet Take 1 tablet (75 mg total) by mouth 2 (two) times daily with a meal.  60 tablet  2   The PMH, PSH, Social History, Family History, Medications, and allergies have been reviewed in [facility], and have been updated if relevant.  OBJECTIVE: BP 150/84  Pulse 64  Temp(Src) 97.7 F (36.5 C) (Oral)  Wt 195 lb (88.451 kg)  Patient appears to be in mild to moderate pain, antalgic gait noted. Lumbosacral spine area reveals no local tenderness or mass.  Painful and reduced LS ROM noted. Straight leg raise is negative bilaterally. DTR's, motor strength and sensation normal, including heel and toe gait.  Peripheral pulses are palpable. X-Ray: ordered, but results not  yet available.  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 Discussed longer term treatment plan of prn NSAID's and discussed a home back care exercise program with flexion exercise routine. Proper lifting with avoidance of heavy lifting discussed. Consider Physical Therapy and XRay studies if not improving. Call or return to clinic prn if these symptoms worsen or fail to improve as anticipated.

## 2011-05-18 ENCOUNTER — Other Ambulatory Visit: Payer: Self-pay | Admitting: Family Medicine

## 2011-05-18 ENCOUNTER — Ambulatory Visit: Payer: Medicare Other | Admitting: Family Medicine

## 2011-05-18 MED ORDER — TRAMADOL HCL 50 MG PO TABS: 50.0000 mg | ORAL_TABLET | Freq: Three times a day (TID) | ORAL | Status: AC | PRN

## 2011-06-02 ENCOUNTER — Other Ambulatory Visit: Payer: Self-pay | Admitting: Family Medicine

## 2011-08-22 ENCOUNTER — Emergency Department: Payer: Self-pay | Admitting: Emergency Medicine

## 2011-08-22 LAB — URINALYSIS, COMPLETE
Bilirubin,UR: NEGATIVE
Blood: NEGATIVE
Glucose,UR: NEGATIVE mg/dL (ref 0–75)
Ketone: NEGATIVE
Leukocyte Esterase: NEGATIVE
Ph: 7 (ref 4.5–8.0)
RBC,UR: 1 /HPF (ref 0–5)
Specific Gravity: 1.013 (ref 1.003–1.030)
Squamous Epithelial: 1

## 2011-08-22 LAB — CBC WITH DIFFERENTIAL/PLATELET
Basophil #: 0.2 10*3/uL — ABNORMAL HIGH (ref 0.0–0.1)
Basophil %: 1.3 %
Eosinophil #: 0.3 10*3/uL (ref 0.0–0.7)
Eosinophil %: 2.3 %
HCT: 38.7 % (ref 35.0–47.0)
HGB: 13 g/dL (ref 12.0–16.0)
Lymphocyte #: 1.9 10*3/uL (ref 1.0–3.6)
Lymphocyte %: 16.5 %
MCH: 32.7 pg (ref 26.0–34.0)
MCHC: 33.6 g/dL (ref 32.0–36.0)
MCV: 97 fL (ref 80–100)
Monocyte #: 0.8 x10 3/mm (ref 0.2–0.9)
Monocyte %: 6.6 %
Neutrophil #: 8.5 10*3/uL — ABNORMAL HIGH (ref 1.4–6.5)
Neutrophil %: 73.3 %
Platelet: 242 10*3/uL (ref 150–440)
RBC: 3.98 10*6/uL (ref 3.80–5.20)
RDW: 12.6 % (ref 11.5–14.5)
WBC: 11.7 10*3/uL — ABNORMAL HIGH (ref 3.6–11.0)

## 2011-08-22 LAB — COMPREHENSIVE METABOLIC PANEL
Albumin: 3.7 g/dL (ref 3.4–5.0)
Alkaline Phosphatase: 87 U/L (ref 50–136)
Anion Gap: 9 (ref 7–16)
BUN: 15 mg/dL (ref 7–18)
Bilirubin,Total: 0.5 mg/dL (ref 0.2–1.0)
Calcium, Total: 8.5 mg/dL (ref 8.5–10.1)
Chloride: 104 mmol/L (ref 98–107)
Co2: 25 mmol/L (ref 21–32)
Creatinine: 0.75 mg/dL (ref 0.60–1.30)
EGFR (African American): >60
EGFR (Non-African Amer.): >60
Glucose: 119 mg/dL — ABNORMAL HIGH (ref 65–99)
Osmolality: 278 (ref 275–301)
Potassium: 3.9 mmol/L (ref 3.5–5.1)
SGOT(AST): 47 U/L — ABNORMAL HIGH (ref 15–37)
SGPT (ALT): 52 U/L (ref 12–78)
Sodium: 138 mmol/L (ref 136–145)
Total Protein: 7.6 g/dL (ref 6.4–8.2)

## 2011-08-22 LAB — LIPASE, BLOOD: Lipase: 209 U/L (ref 73–393)

## 2011-08-22 LAB — TROPONIN I: Troponin-I: <0.02 ng/mL

## 2011-08-25 ENCOUNTER — Encounter: Payer: Self-pay | Admitting: Family Medicine

## 2011-08-25 ENCOUNTER — Ambulatory Visit (INDEPENDENT_AMBULATORY_CARE_PROVIDER_SITE_OTHER): Payer: Medicare Other | Admitting: Family Medicine

## 2011-08-25 VITALS — BP 140/84 | HR 56 | Temp 97.9°F | Wt 184.0 lb

## 2011-08-25 DIAGNOSIS — S0990XA Unspecified injury of head, initial encounter: Secondary | ICD-10-CM | POA: Insufficient documentation

## 2011-08-25 NOTE — Progress Notes (Signed)
Subjective:    Patient ID: [name], female    DOB: [birth year], 71 y.o.   MRN: [id number]  HPI  71 yo with h/p hypothyroidism, HLD, venous insufficiency here for ER follow up.  ER note from [facility] reviewed but I do not have H&P, discharge summary or ER note from [facility] yet.  [name] has an rx for Tramadol that I prescribed in April.  She took it once but it made her nauseated so she did not take it again. Ortho MD told her to take it for shoulder pain so she took it again on Sunday.  Approximately one hour after taking it, began to vomit.  Family heard her call out for help in the bathroom and then her a thud on the floor.  When they went into bathroom, she was laying on the floor seizing. She was immediately taken to [facility] via EMS.  Pt was transported from [facility] to [facility] secondary to SDH seen on head CT after she fell, hit her head and had LOC for approx 5 minutes.  C spine showed no acute fracture.  Follow up head CT neg.  Syncope work up deferred as pt felt fall was due to Tramadol use.  She was admitted for neuor checks and d/c'd home.  Since discharge, she feels sore- her head hurts and she gets dizzy when she bends down.  Otherwise she feels fine.  Patient Active Problem List  Diagnosis  . HYPOTHYROIDISM  . HYPERCHOLESTEROLEMIA  . COMMON MIGRAINE  . UNSPECIFIED VENOUS INSUFFICIENCY  . Occult blood in stools  . Back pain  . Head injury   Past Medical History  Diagnosis Date  . Venous stasis ulcer 11/1997    Wound care center, [place]  . Hypothyroidism   . Hypercholesteremia   . Back pain   . Migraine    Past Surgical History  Procedure Date  . Abdominal hysterectomy     1 1/2 ovaries gone, second to endometriosis  . Appendectomy   . Thyroidectomy   . Hernia repair   . Rotator cuff repair   . Cystoscopy    History  Substance Use Topics  . Smoking status: Never Smoker   . Smokeless tobacco: Not on file  . Alcohol Use: No   Family History  Problem  Relation Age of Onset  . Stroke Mother   . Stomach cancer Sister   . Heart attack Father   . Heart attack Brother   . Colon cancer Neg Hx   . Esophageal cancer Neg Hx    Allergies  Allergen Reactions  . Codeine     REACTION: NAUSEA AND VOMITING  . Tramadol     Vomiting, LOC   Current Outpatient Prescriptions on File Prior to Visit  Medication Sig Dispense Refill  . atenolol (TENORMIN) 50 MG tablet Take 1 tablet (50 mg total) by mouth daily.  90 tablet  3  . butalbital-acetaminophen-caffeine (FIORICET) 50-325-40 MG per tablet Take one tablet by mouth every 4 hours as needed.  Do not exceed 6 tablets per day  60 tablet  0  . CRESTOR 40 MG tablet 1 tablet Daily.      . cyclobenzaprine (FLEXERIL) 5 MG tablet Take 1 tablet (5 mg total) by mouth every 8 (eight) hours as needed for muscle spasms.  30 tablet  1  . diclofenac (VOLTAREN) 75 MG EC tablet Take 1 tablet (75 mg total) by mouth 2 (two) times daily with a meal.  60 tablet  2  .  estradiol (ESTRACE) 1 MG tablet TAKE 1 TABLET BY MOUTH EVERY DAY  90 tablet  1  . levothyroxine (SYNTHROID, LEVOTHROID) 125 MCG tablet TAKE 1 TABLET ( ) BY ORAL ROUTE ONCE DAILY  90 tablet  1  . Multiple Vitamin (MULTIVITAMIN) tablet Take 1 tablet by mouth daily.        . polyethylene glycol powder (MIRALAX) powder Take 17 g by mouth every other day.        . SUMAtriptan (IMITREX) 20 MG/ACT nasal spray Place 1 spray into the nose as needed.         The PMH, PSH, Social History, Family History, Medications, and allergies have been reviewed in [facility], and have been updated if relevant.     Review of Systems See HPI  No nausea or vomiting No photophobia    Objective:   Physical Exam BP 140/84  Pulse 56  Temp 97.9 F (36.6 C)  Wt 184 lb (83.462 kg)  General: Well-developed,well-nourished,in no acute distress; alert,appropriate and cooperative throughout examination  Head: normocephalic and atraumatic.  TTP over occipital area, no visible  abnormalities Eyes: vision grossly intact, pupils equal, pupils round, and pupils reactive to light.  Ears: R ear normal and L ear normal.  Nose: no external deformity.  Mouth: good dentition.  Neck: No deformities, masses, or tenderness noted.  Lungs: Normal respiratory effort, chest expands symmetrically. Lungs are clear to auscultation, no crackles or wheezes.  Heart: Normal rate and regular rhythm. S1 and S2 normal without gallop, murmur, click, rub or other extra sounds.  Abdomen: Bowel sounds positive,abdomen soft and non-tender without masses, organomegaly or hernias noted.  Msk: No deformity or scoliosis noted of thoracic or lumbar spine.  Extremities: No clubbing, cyanosis, edema, or deformity noted with normal full range of motion of all joints.  Neurologic: alert & oriented X3 and gait normal.  Skin: Intact without suspicious lesions or rashes  Psych: Cognition and judgment appear intact. Alert and cooperative with normal attention span and concentration. No apparent delusions, illusions, hallucinations       Assessment & Plan:   1. Head injury    New- now with post concussive symptoms. Advised "brain rest,"- resting at home, dark room- no TV or bright lights.  No heavy activity. Tramadol added to allergy list.  Pt aware she should not take it. Follow up as needed.  Discussed red flag symptoms. The patient indicates understanding of these issues and agrees with the plan.

## 2011-09-26 ENCOUNTER — Other Ambulatory Visit: Payer: Self-pay | Admitting: Family Medicine

## 2011-11-24 ENCOUNTER — Other Ambulatory Visit: Payer: Self-pay | Admitting: Family Medicine

## 2011-11-24 NOTE — Telephone Encounter (Signed)
Pt has only been seen for acute visits in past year, has physical scheduled for January.

## 2011-12-26 ENCOUNTER — Other Ambulatory Visit: Payer: Self-pay | Admitting: Family Medicine

## 2012-01-06 ENCOUNTER — Other Ambulatory Visit: Payer: Self-pay | Admitting: Family Medicine

## 2012-01-06 DIAGNOSIS — E039 Hypothyroidism, unspecified: Secondary | ICD-10-CM

## 2012-01-06 DIAGNOSIS — E7800 Pure hypercholesterolemia, unspecified: Secondary | ICD-10-CM

## 2012-01-17 ENCOUNTER — Other Ambulatory Visit (INDEPENDENT_AMBULATORY_CARE_PROVIDER_SITE_OTHER): Payer: Medicare Other

## 2012-01-17 DIAGNOSIS — E039 Hypothyroidism, unspecified: Secondary | ICD-10-CM

## 2012-01-17 DIAGNOSIS — E7800 Pure hypercholesterolemia, unspecified: Secondary | ICD-10-CM

## 2012-01-17 LAB — COMPREHENSIVE METABOLIC PANEL
AST: 37 U/L (ref 0–37)
Albumin: 3.7 g/dL (ref 3.5–5.2)
BUN: 11 mg/dL (ref 6–23)
Calcium: 8.7 mg/dL (ref 8.4–10.5)
Chloride: 106 mEq/L (ref 96–112)
Creatinine, Ser: 0.8 mg/dL (ref 0.4–1.2)
GFR: 70.98 mL/min (ref >60.00)
Glucose, Bld: 94 mg/dL (ref 70–99)
Potassium: 4.2 mEq/L (ref 3.5–5.1)

## 2012-01-17 LAB — LIPID PANEL
Cholesterol: 169 mg/dL (ref 0–200)
HDL: 50.9 mg/dL (ref >39.00)
Triglycerides: 202 mg/dL — ABNORMAL HIGH (ref 0.0–149.0)

## 2012-01-24 ENCOUNTER — Ambulatory Visit (INDEPENDENT_AMBULATORY_CARE_PROVIDER_SITE_OTHER): Payer: Medicare Other | Admitting: Family Medicine

## 2012-01-24 ENCOUNTER — Encounter: Payer: Self-pay | Admitting: Family Medicine

## 2012-01-24 VITALS — BP 142/80 | HR 64 | Temp 97.9°F | Ht 68.25 in | Wt 194.0 lb

## 2012-01-24 DIAGNOSIS — Z1231 Encounter for screening mammogram for malignant neoplasm of breast: Secondary | ICD-10-CM

## 2012-01-24 DIAGNOSIS — E7800 Pure hypercholesterolemia, unspecified: Secondary | ICD-10-CM

## 2012-01-24 DIAGNOSIS — E039 Hypothyroidism, unspecified: Secondary | ICD-10-CM

## 2012-01-24 DIAGNOSIS — Z0000 Encounter for general adult medical examination without abnormal findings: Secondary | ICD-10-CM

## 2012-01-24 DIAGNOSIS — H6120 Impacted cerumen, unspecified ear: Secondary | ICD-10-CM | POA: Insufficient documentation

## 2012-01-24 NOTE — Progress Notes (Signed)
Subjective:    Patient ID: [name], female    DOB: 1940-06-26, 72 y.o.   MRN: [id number]  HPI  72 yo very pleasant female with h/o HLD, hypothyroidism, venous insufficiency here for annual medicare wellness visit..  I have personally reviewed the Medicare Annual Wellness questionnaire and have noted 1. The patient's medical and social history 2. Their use of alcohol, tobacco or illicit drugs 3. Their current medications and supplements 4. The patient's functional ability including ADL's, fall risks, home safety risks and hearing or visual             impairment. 5. Diet and physical activities 6. Evidence for depression or mood disorders  HLD- had previously been on zocor 80 mg for years but was complaining of myalgias.  We switched her to simvastatin 40 mg daily last year.  Myalgias have resolved and LDL cholesterol is 85!  Lab Results  Component Value Date   CHOL 169 01/17/2012   HDL 50.90 01/17/2012   LDLCALC 55 08/25/2010   LDLDIRECT 85.2 01/17/2012   TRIG 202.0* 01/17/2012   CHOLHDL 3 01/17/2012    Hypothyroidism- on synthroid 125 mcg daily.  Denies any symptoms of hypo or hyperthyroidism.  Lab Results  Component Value Date   TSH 1.91 01/17/2012     UTD on prevention- normal colonoscopy last October.     Patient Active Problem List  Diagnosis  . HYPOTHYROIDISM  . HYPERCHOLESTEROLEMIA  . COMMON MIGRAINE  . UNSPECIFIED VENOUS INSUFFICIENCY  . Occult blood in stools  . Back pain  . Head injury  . Routine general medical examination at a health care facility   Past Medical History  Diagnosis Date  . Venous stasis ulcer 11/1997    Wound care center, [place]  . Hypothyroidism   . Hypercholesteremia   . Back pain   . Migraine    Past Surgical History  Procedure Date  . Abdominal hysterectomy     1 1/2 ovaries gone, second to endometriosis  . Appendectomy   . Thyroidectomy   . Hernia repair   . Rotator cuff repair   . Cystoscopy    History    Substance Use Topics  . Smoking status: Never Smoker   . Smokeless tobacco: Not on file  . Alcohol Use: No   Family History  Problem Relation Age of Onset  . Stroke Mother   . Stomach cancer Sister   . Heart attack Father   . Heart attack Brother   . Colon cancer Neg Hx   . Esophageal cancer Neg Hx    Allergies  Allergen Reactions  . Codeine     REACTION: NAUSEA AND VOMITING  . Tramadol     Vomiting, LOC   Current Outpatient Prescriptions on File Prior to Visit  Medication Sig Dispense Refill  . atenolol (TENORMIN) 50 MG tablet Take 1 tablet (50 mg total) by mouth daily.  90 tablet  3  . butalbital-acetaminophen-caffeine (FIORICET) 50-325-40 MG per tablet Take one tablet by mouth every 4 hours as needed.  Do not exceed 6 tablets per day  60 tablet  0  . CRESTOR 40 MG tablet 1 tablet Daily.      [name] estradiol (ESTRACE) 1 MG tablet TAKE 1 TABLET BY MOUTH EVERY DAY  90 tablet  1  . ibuprofen (ADVIL,MOTRIN) 800 MG tablet TAKE 1 TABLET BY MOUTH EVERY 4 TO 6 HOURS AS NEEDED  180 tablet  0  . levothyroxine (SYNTHROID, LEVOTHROID) 125 MCG tablet TAKE 1 TABLET (  ) ONCE DAILY  90 tablet  0  . Multiple Vitamin (MULTIVITAMIN) tablet Take 1 tablet by mouth daily.        . polyethylene glycol powder (MIRALAX) powder Take 17 g by mouth every other day.        . simvastatin (ZOCOR) 40 MG tablet TAKE 1 TABLET (40 MG TOTAL) BY MOUTH AT BEDTIME.  90 tablet  0  . simvastatin (ZOCOR) 40 MG tablet TAKE 1 TABLET (40 MG TOTAL) BY MOUTH AT BEDTIME.  90 tablet  0  . SUMAtriptan (IMITREX) 20 MG/ACT nasal spray Place 1 spray into the nose as needed.         The PMH, PSH, Social History, Family History, Medications, and allergies have been reviewed in [facility], and have been updated if relevant.    Review of Systems See HPI Patient reports no  vision/ hearing changes,anorexia, weight change, fever ,adenopathy, persistant / recurrent hoarseness, swallowing issues, chest pain, edema,persistant / recurrent  cough, hemoptysis, dyspnea(rest, exertional, paroxysmal nocturnal), gastrointestinal  bleeding (melena, rectal bleeding), abdominal pain, excessive heart burn, GU symptoms(dysuria, hematuria, pyuria, voiding/incontinence  Issues) syncope, focal weakness, severe memory loss, concerning skin lesions, depression, anxiety, abnormal bruising/bleeding, major joint swelling, breast masses or abnormal vaginal bleeding.       Objective:   Physical Exam BP 142/80  Pulse 64  Temp 97.9 F (36.6 C)  Ht 5' 8.25" (1.734 m)  Wt 194 lb (87.998 kg)  BMI 29.28 kg/m2   General:  Well-developed,well-nourished,in no acute distress; alert,appropriate and cooperative throughout examination Head:  normocephalic and atraumatic.   Eyes:  vision grossly intact, pupils equal, pupils round, and pupils reactive to light.   Ears:  R ear normal and L ear normal.   Cerumen impaction bilaterally Nose:  no external deformity.   Mouth:  good dentition.   Neck:  No deformities, masses, or tenderness noted. Lungs:  Normal respiratory effort, chest expands symmetrically. Lungs are clear to auscultation, no crackles or wheezes. Heart:  Normal rate and regular rhythm. S1 and S2 normal without gallop, murmur, click, rub or other extra sounds. Abdomen:  Bowel sounds positive,abdomen soft and non-tender without masses, organomegaly or hernias noted. Msk:  No deformity or scoliosis noted of thoracic or lumbar spine.   Extremities:  No clubbing, cyanosis, edema, or deformity noted with normal full range of motion of all joints.   Neurologic:  alert & oriented X3 and gait normal.   Skin:  Intact without suspicious lesions or rashes Cervical Nodes:  No lymphadenopathy noted Axillary Nodes:  No palpable lymphadenopathy Psych:  Cognition and judgment appear intact. Alert and cooperative with normal attention span and concentration. No apparent delusions, illusions, hallucinations       Assessment & Plan:   1.  HYPERCHOLESTEROLEMIA  Well controlled on current dose of Zocor.   2. HYPOTHYROIDISM  Stable on current dose of synthroid.   3. Routine general medical examination at a health care facility  The patients weight, height, BMI and visual acuity have been recorded in the chart I have made referrals, counseling and provided education to the patient based review of the above and I have provided the pt with a written personalized care plan for preventive services.    4. Other screening mammogram  MM Digital Screening  5. Cerumen impaction    Wax is removed by syringing and manual debridement. Instructions for home care to prevent wax buildup are given.

## 2012-01-24 NOTE — Patient Instructions (Addendum)
Great to see you. Happy New Year!  Please stop by to see [name] on your way out to set up your mammogram.

## 2012-01-25 ENCOUNTER — Telehealth: Payer: Self-pay | Admitting: *Deleted

## 2012-01-25 DIAGNOSIS — N951 Menopausal and female climacteric states: Secondary | ICD-10-CM

## 2012-01-25 NOTE — Telephone Encounter (Signed)
Prior Berkley Harvey is needed for estrogen, form from [facility] is on your desk.

## 2012-01-27 ENCOUNTER — Encounter: Payer: Self-pay | Admitting: Family Medicine

## 2012-01-27 DIAGNOSIS — N951 Menopausal and female climacteric states: Secondary | ICD-10-CM | POA: Insufficient documentation

## 2012-01-27 NOTE — Telephone Encounter (Signed)
Form faxed

## 2012-01-27 NOTE — Telephone Encounter (Signed)
In my box

## 2012-01-28 ENCOUNTER — Encounter: Payer: Self-pay | Admitting: Family Medicine

## 2012-01-28 LAB — HM PAP SMEAR: HM Pap smear: NORMAL

## 2012-01-31 ENCOUNTER — Encounter: Payer: Self-pay | Admitting: *Deleted

## 2012-02-13 ENCOUNTER — Other Ambulatory Visit: Payer: Self-pay | Admitting: Family Medicine

## 2012-03-19 ENCOUNTER — Other Ambulatory Visit: Payer: Self-pay | Admitting: Family Medicine

## 2012-05-08 ENCOUNTER — Ambulatory Visit (INDEPENDENT_AMBULATORY_CARE_PROVIDER_SITE_OTHER): Payer: Medicare Other | Admitting: Family Medicine

## 2012-05-08 ENCOUNTER — Encounter: Payer: Self-pay | Admitting: Family Medicine

## 2012-05-08 VITALS — BP 132/84 | HR 64 | Temp 97.8°F | Wt 197.0 lb

## 2012-05-08 DIAGNOSIS — J069 Acute upper respiratory infection, unspecified: Secondary | ICD-10-CM

## 2012-05-08 DIAGNOSIS — I10 Essential (primary) hypertension: Secondary | ICD-10-CM | POA: Insufficient documentation

## 2012-05-08 DIAGNOSIS — J029 Acute pharyngitis, unspecified: Secondary | ICD-10-CM

## 2012-05-08 MED ORDER — AZITHROMYCIN 250 MG PO TABS: ORAL_TABLET | ORAL | Status: DC

## 2012-05-08 MED ORDER — BENZONATATE 200 MG PO CAPS: 200.0000 mg | ORAL_CAPSULE | Freq: Two times a day (BID) | ORAL | Status: DC | PRN

## 2012-05-08 NOTE — Patient Instructions (Signed)
Good to see you. Please take zpack as directed. Drink plenty of fluids. Tessalon as needed for cough.

## 2012-05-08 NOTE — Progress Notes (Signed)
SUBJECTIVE:  [name] is a 72 y.o. female who complains of coryza, congestion, sneezing, sore throat and dry cough for 14 days. She denies a history of anorexia, chest pain, chills, fatigue and fevers and denies a history of asthma. Patient denies smoke cigarettes.   Patient Active Problem List  Diagnosis  . HYPOTHYROIDISM  . UNSPECIFIED VENOUS INSUFFICIENCY  . Head injury  . Post menopausal syndrome  . Acute pharyngitis  . HTN (hypertension)   Past Medical History  Diagnosis Date  . Venous stasis ulcer 11/1997    Wound care center, [place]  . Hypothyroidism   . Hypercholesteremia   . Back pain   . Migraine    Past Surgical History  Procedure Laterality Date  . Abdominal hysterectomy      1 1/2 ovaries gone, second to endometriosis  . Appendectomy    . Thyroidectomy    . Hernia repair    . Rotator cuff repair    . Cystoscopy     History  Substance Use Topics  . Smoking status: Never Smoker   . Smokeless tobacco: Not on file  . Alcohol Use: No   Family History  Problem Relation Age of Onset  . Stroke Mother   . Stomach cancer Sister   . Heart attack Father   . Heart attack Brother   . Colon cancer Neg Hx   . Esophageal cancer Neg Hx    Allergies  Allergen Reactions  . Codeine     REACTION: NAUSEA AND VOMITING  . Tramadol     Vomiting, LOC   Current Outpatient Prescriptions on File Prior to Visit  Medication Sig Dispense Refill  . atenolol (TENORMIN) 50 MG tablet TAKE 1 TABLET BY MOUTH EVERY DAY  90 tablet  3  . butalbital-acetaminophen-caffeine (FIORICET) 50-325-40 MG per tablet Take one tablet by mouth every 4 hours as needed.  Do not exceed 6 tablets per day  60 tablet  0  . estradiol (ESTRACE) 1 MG tablet TAKE 1 TABLET BY MOUTH EVERY DAY  90 tablet  1  . ibuprofen (ADVIL,MOTRIN) 800 MG tablet TAKE 1 TABLET BY MOUTH EVERY 4 TO 6 HOURS AS NEEDED  180 tablet  0  . levothyroxine (SYNTHROID, LEVOTHROID) 125 MCG tablet TAKE 1 TABLET ( ) ONCE  DAILY  90 tablet  1  . Multiple Vitamin (MULTIVITAMIN) tablet Take 1 tablet by mouth daily.        . simvastatin (ZOCOR) 40 MG tablet TAKE 1 TABLET (40 MG TOTAL) BY MOUTH AT BEDTIME.  90 tablet  1  . SUMAtriptan (IMITREX) 20 MG/ACT nasal spray Place 1 spray into the nose as needed.         No current facility-administered medications on file prior to visit.  ]The PMH, PSH, Social History, Family History, Medications, and allergies have been reviewed in [facility], and have been updated if relevant.  OBJECTIVE: BP 132/84  Pulse 64  Temp(Src) 97.8 F (36.6 C)  Wt 197 lb (89.359 kg)  BMI 29.72 kg/m2  She appears well, vital signs are as noted. Ears normal.  Throat and pharynx normal.  Neck supple. No adenopathy in the neck. Nose is congested. Sinuses non tender. The chest is clear, without wheezes or rales.  ASSESSMENT:  sinusitis  PLAN: Given duration and progression of symptoms, will treat for bacterial sinusitis. Symptomatic therapy suggested: push fluids, rest and return office visit prn if symptoms persist or worsen.  Call or return to clinic prn if these symptoms worsen or fail  to improve as anticipated.

## 2012-05-16 ENCOUNTER — Other Ambulatory Visit: Payer: Self-pay | Admitting: Family Medicine

## 2012-09-06 ENCOUNTER — Ambulatory Visit: Payer: Medicare Other | Admitting: Gastroenterology

## 2012-09-10 ENCOUNTER — Other Ambulatory Visit: Payer: Self-pay | Admitting: Family Medicine

## 2012-09-11 ENCOUNTER — Encounter: Payer: Self-pay | Admitting: Family Medicine

## 2012-09-11 ENCOUNTER — Ambulatory Visit (INDEPENDENT_AMBULATORY_CARE_PROVIDER_SITE_OTHER): Payer: Medicare Other | Admitting: Family Medicine

## 2012-09-11 VITALS — BP 152/62 | HR 75 | Temp 97.8°F | Wt 193.0 lb

## 2012-09-11 DIAGNOSIS — L723 Sebaceous cyst: Secondary | ICD-10-CM

## 2012-09-11 NOTE — Patient Instructions (Addendum)
Keep the area covered for now.  Pull the packing on Wednesday.  You can wash with soap and water at that point.  Keep covered if needed.  If you have spreading redness or a fever then notify the clinic.  Take care.

## 2012-09-11 NOTE — Assessment & Plan Note (Signed)
I&D done.  Tolerated well  Routine postprocedure instructions d/w pt- remove packing in 48h, keep area clean and bandaged, follow up if concerns/spreading erythema/pain.  No complications.

## 2012-09-11 NOTE — Progress Notes (Signed)
Enlarging knot on back of R shoulder for a few days.  More red and irritated recently.  Feels well o/w.  No FCNAVD.    ROS: See HPI.  Otherwise, noncontributory.  I&D  Meds, vitals, and allergies reviewed.   Indication: sebaceous cyst, infected  Pt complaints of: erythema, pain, swelling  Location: R posterior shoulder  Size: 2cm  Informed consent obtained.  Pt aware of risks not limited to but including infection, bleeding, damage to near by organs.  Prep: etoh/betadine  Anesthesia: 2%lidocaine with epi, good effect  Incision made with #11 blade  Would explored and loculations removed  Wound packed with iodoform gauze  Tolerated well  Routine postprocedure instructions d/w pt- remove packing in 24-48h, keep area clean and bandaged, follow up if concerns/spreading erythema/pain.

## 2012-09-21 ENCOUNTER — Ambulatory Visit (INDEPENDENT_AMBULATORY_CARE_PROVIDER_SITE_OTHER): Payer: Medicare Other

## 2012-09-21 DIAGNOSIS — Z23 Encounter for immunization: Secondary | ICD-10-CM

## 2013-01-18 ENCOUNTER — Emergency Department (HOSPITAL_COMMUNITY): Payer: Medicare Other

## 2013-01-18 ENCOUNTER — Encounter (HOSPITAL_COMMUNITY): Payer: Self-pay | Admitting: Emergency Medicine

## 2013-01-18 ENCOUNTER — Emergency Department (HOSPITAL_COMMUNITY)
Admission: EM | Admit: 2013-01-18 | Discharge: 2013-01-18 | Disposition: A | Discharge status: Home / Self Care | Payer: Medicare Other | Attending: Emergency Medicine | Admitting: Emergency Medicine

## 2013-01-18 DIAGNOSIS — R413 Other amnesia: Secondary | ICD-10-CM | POA: Insufficient documentation

## 2013-01-18 DIAGNOSIS — R42 Dizziness and giddiness: Secondary | ICD-10-CM | POA: Insufficient documentation

## 2013-01-18 DIAGNOSIS — E039 Hypothyroidism, unspecified: Secondary | ICD-10-CM | POA: Insufficient documentation

## 2013-01-18 DIAGNOSIS — J309 Allergic rhinitis, unspecified: Secondary | ICD-10-CM | POA: Insufficient documentation

## 2013-01-18 DIAGNOSIS — S060X1A Concussion with loss of consciousness of 30 minutes or less, initial encounter: Secondary | ICD-10-CM | POA: Insufficient documentation

## 2013-01-18 DIAGNOSIS — Z885 Allergy status to narcotic agent status: Secondary | ICD-10-CM | POA: Insufficient documentation

## 2013-01-18 DIAGNOSIS — IMO0001 Reserved for inherently not codable concepts without codable children: Secondary | ICD-10-CM | POA: Insufficient documentation

## 2013-01-18 DIAGNOSIS — R296 Repeated falls: Secondary | ICD-10-CM | POA: Insufficient documentation

## 2013-01-18 DIAGNOSIS — Z7982 Long term (current) use of aspirin: Secondary | ICD-10-CM | POA: Insufficient documentation

## 2013-01-18 DIAGNOSIS — S0181XA Laceration without foreign body of other part of head, initial encounter: Secondary | ICD-10-CM

## 2013-01-18 DIAGNOSIS — R509 Fever, unspecified: Secondary | ICD-10-CM | POA: Insufficient documentation

## 2013-01-18 DIAGNOSIS — S0180XA Unspecified open wound of other part of head, initial encounter: Secondary | ICD-10-CM | POA: Insufficient documentation

## 2013-01-18 DIAGNOSIS — E7800 Pure hypercholesterolemia, unspecified: Secondary | ICD-10-CM | POA: Insufficient documentation

## 2013-01-18 DIAGNOSIS — R05 Cough: Secondary | ICD-10-CM | POA: Insufficient documentation

## 2013-01-18 DIAGNOSIS — Z79899 Other long term (current) drug therapy: Secondary | ICD-10-CM | POA: Insufficient documentation

## 2013-01-18 DIAGNOSIS — Z8669 Personal history of other diseases of the nervous system and sense organs: Secondary | ICD-10-CM | POA: Insufficient documentation

## 2013-01-18 DIAGNOSIS — Y92009 Unspecified place in unspecified non-institutional (private) residence as the place of occurrence of the external cause: Secondary | ICD-10-CM | POA: Insufficient documentation

## 2013-01-18 DIAGNOSIS — Z23 Encounter for immunization: Secondary | ICD-10-CM | POA: Insufficient documentation

## 2013-01-18 DIAGNOSIS — R059 Cough, unspecified: Secondary | ICD-10-CM | POA: Insufficient documentation

## 2013-01-18 DIAGNOSIS — Z888 Allergy status to other drugs, medicaments and biological substances status: Secondary | ICD-10-CM | POA: Insufficient documentation

## 2013-01-18 DIAGNOSIS — R55 Syncope and collapse: Secondary | ICD-10-CM

## 2013-01-18 DIAGNOSIS — J111 Influenza due to unidentified influenza virus with other respiratory manifestations: Secondary | ICD-10-CM

## 2013-01-18 DIAGNOSIS — Y93E8 Activity, other personal hygiene: Secondary | ICD-10-CM | POA: Insufficient documentation

## 2013-01-18 DIAGNOSIS — M6281 Muscle weakness (generalized): Secondary | ICD-10-CM | POA: Insufficient documentation

## 2013-01-18 LAB — CBC WITH DIFFERENTIAL/PLATELET
Basophils Absolute: 0 10*3/uL (ref 0.0–0.1)
Basophils Relative: 0 % (ref 0–1)
EOS ABS: 0.1 10*3/uL (ref 0.0–0.7)
EOS PCT: 1 % (ref 0–5)
HCT: 40.6 % (ref 36.0–46.0)
Hemoglobin: 14 g/dL (ref 12.0–15.0)
LYMPHS PCT: 14 % (ref 12–46)
Lymphs Abs: 0.8 10*3/uL (ref 0.7–4.0)
MCH: 32.7 pg (ref 26.0–34.0)
MCHC: 34.5 g/dL (ref 30.0–36.0)
MCV: 94.9 fL (ref 78.0–100.0)
MONOS PCT: 8 % (ref 3–12)
Monocytes Absolute: 0.5 10*3/uL (ref 0.1–1.0)
Neutro Abs: 4.4 10*3/uL (ref 1.7–7.7)
Neutrophils Relative %: 76 % (ref 43–77)
PLATELETS: 222 10*3/uL (ref 150–400)
RBC: 4.28 MIL/uL (ref 3.87–5.11)
RDW: 13.2 % (ref 11.5–15.5)
WBC: 5.8 10*3/uL (ref 4.0–10.5)

## 2013-01-18 LAB — COMPREHENSIVE METABOLIC PANEL
ALT: 38 U/L — AB (ref 0–35)
AST: 39 U/L — ABNORMAL HIGH (ref 0–37)
Albumin: 3.5 g/dL (ref 3.5–5.2)
Alkaline Phosphatase: 91 U/L (ref 39–117)
BUN: 10 mg/dL (ref 6–23)
CO2: 25 meq/L (ref 19–32)
CREATININE: 0.69 mg/dL (ref 0.50–1.10)
Calcium: 8.2 mg/dL — ABNORMAL LOW (ref 8.4–10.5)
Chloride: 101 mEq/L (ref 96–112)
GFR, EST NON AFRICAN AMERICAN: 85 mL/min — AB (ref >90)
GLUCOSE: 128 mg/dL — AB (ref 70–99)
Potassium: 4.1 mEq/L (ref 3.7–5.3)
SODIUM: 138 meq/L (ref 137–147)
TOTAL PROTEIN: 7 g/dL (ref 6.0–8.3)
Total Bilirubin: 0.6 mg/dL (ref 0.3–1.2)

## 2013-01-18 LAB — POCT I-STAT, CHEM 8
BUN: 9 mg/dL (ref 6–23)
CALCIUM ION: 1.17 mmol/L (ref 1.13–1.30)
Chloride: 103 mEq/L (ref 96–112)
Creatinine, Ser: 0.7 mg/dL (ref 0.50–1.10)
Glucose, Bld: 130 mg/dL — ABNORMAL HIGH (ref 70–99)
HEMATOCRIT: 41 % (ref 36.0–46.0)
Hemoglobin: 13.9 g/dL (ref 12.0–15.0)
Potassium: 3.7 mEq/L (ref 3.7–5.3)
Sodium: 140 mEq/L (ref 137–147)
TCO2: 25 mmol/L (ref 0–100)

## 2013-01-18 MED ORDER — OXYCODONE-ACETAMINOPHEN 5-325 MG PO TABS: 2.0000 | ORAL_TABLET | Freq: Four times a day (QID) | ORAL | Status: DC | PRN

## 2013-01-18 MED ORDER — ONDANSETRON 8 MG PO TBDP
ORAL_TABLET | ORAL | Status: DC
Start: 2013-01-18 — End: 2013-02-21

## 2013-01-18 MED ORDER — ONDANSETRON HCL 4 MG/2ML IJ SOLN
4.0000 mg | Freq: Once | INTRAMUSCULAR | Status: AC
  Administered 2013-01-18: 4 mg via INTRAVENOUS
  Filled 2013-01-18: qty 2

## 2013-01-18 MED ORDER — FENTANYL CITRATE 0.05 MG/ML IJ SOLN
50.0000 ug | Freq: Once | INTRAMUSCULAR | Status: AC
  Administered 2013-01-18: 50 ug via INTRAVENOUS
  Filled 2013-01-18: qty 2

## 2013-01-18 MED ORDER — SODIUM CHLORIDE 0.9 % IV BOLUS (SEPSIS)
1000.0000 mL | Freq: Once | INTRAVENOUS | Status: AC
  Administered 2013-01-18: 1000 mL via INTRAVENOUS

## 2013-01-18 MED ORDER — TETANUS-DIPHTH-ACELL PERTUSSIS 5-2.5-18.5 LF-MCG/0.5 IM SUSP
0.5000 mL | Freq: Once | INTRAMUSCULAR | Status: AC
  Administered 2013-01-18: 0.5 mL via INTRAMUSCULAR
  Filled 2013-01-18: qty 0.5

## 2013-01-18 MED ORDER — METOCLOPRAMIDE HCL 10 MG PO TABS: 10.0000 mg | ORAL_TABLET | Freq: Four times a day (QID) | ORAL | Status: DC | PRN

## 2013-01-18 NOTE — ED Notes (Signed)
Holding pressure for wound. Hemostasis achieved. Bednar MD aware. Suture cart at bedside

## 2013-01-18 NOTE — ED Notes (Addendum)
Wound to R forehead bleeding profusely sporadically.  Pressure dressing placed and bleeding controlled.

## 2013-01-18 NOTE — ED Notes (Signed)
Resident at bedside suturing pt.  

## 2013-01-18 NOTE — ED Notes (Signed)
Pt. Was on the toilet and felt lightheaded and called out for pt. And had a syncopal episode. Pt. Has a laceration above her rt. Forehead.  Approximately 1 inch .  NO neuro deficits. Posterior neck pain c--collar maintained

## 2013-01-18 NOTE — ED Provider Notes (Signed)
CSN: [id number]     Arrival date & time 01/18/13  7681 History   First MD Initiated Contact with Patient 01/18/13 [id number]     Chief Complaint  Patient presents with  . Loss of Consciousness   (Consider location/radiation/quality/duration/timing/severity/associated sxs/prior Treatment) HPI 2 days cough fever chills body aches generally weak lightheaded prodrome knew she was going to faint while sat down on the toilet and then partial amnesia for event found brief syncopal on floor next to it the bathtub husband heard her fall Pt with forehead laceration and chin laceration teeth intact  baseline chronic neck pain no headache no altered mental status once awake no change in speech vision swallowing or understanding no focal or lateralizing weakness or numbness or incoordination, no CP/SOB/palpitations.  Nonfocal neuro did not test gait initially; 2 cm right forehead laceration 2 cm chin laceration minimal cervical spine tenderness status baseline lungs clear extremities nontender Past Medical History  Diagnosis Date  . Venous stasis ulcer 11/1997    [facility], [place]  . Hypothyroidism   . Hypercholesteremia   . Back pain   . Migraine    Past Surgical History  Procedure Laterality Date  . Abdominal hysterectomy      1 1/2 ovaries gone, second to endometriosis  . Appendectomy    . Thyroidectomy    . Hernia repair    . Rotator cuff repair    . Cystoscopy     Family History  Problem Relation Age of Onset  . Stroke Mother   . Stomach cancer Sister   . Heart attack Father   . Heart attack Brother   . Colon cancer Neg Hx   . Esophageal cancer Neg Hx    History  Substance Use Topics  . Smoking status: Never Smoker   . Smokeless tobacco: Not on file  . Alcohol Use: No   OB History   Grav Para Term Preterm Abortions TAB SAB Ect Mult Living                 Review of Systems 10 Systems reviewed and are negative for acute change except as noted in the HPI. Allergies   Codeine and Tramadol  Home Medications   Current Outpatient Rx  Name  Route  Sig  Dispense  Refill  . aspirin EC 81 MG tablet   Oral   Take 81 mg by mouth daily.         [name] atenolol (TENORMIN) 50 MG tablet   Oral   Take 50 mg by mouth daily.         . butalbital-acetaminophen-caffeine (FIORICET) 50-325-40 MG per tablet      Take one tablet by mouth every 4 hours as needed.  Do not exceed 6 tablets per day   60 tablet   0   . Cholecalciferol (VITAMIN D-3) 1000 UNITS CAPS   Oral   Take 1 capsule by mouth daily.         [name] docusate sodium (COLACE) 100 MG capsule   Oral   Take 100 mg by mouth 2 (two) times daily.         [name] estradiol (ESTRACE) 1 MG tablet   Oral   Take 1 mg by mouth daily.         [name] levothyroxine (SYNTHROID, LEVOTHROID) 125 MCG tablet   Oral   Take 125 mcg by mouth daily before breakfast.         . Multiple Vitamin (MULTIVITAMIN) tablet   Oral  Take 1 tablet by mouth daily.           [name] omega-3 acid ethyl esters (LOVAZA) 1 G capsule   Oral   Take 1 g by mouth daily.         . simvastatin (ZOCOR) 40 MG tablet   Oral   Take 40 mg by mouth at bedtime.         . SUMAtriptan (IMITREX) 20 MG/ACT nasal spray   Nasal   Place 1 spray into the nose as needed.           . metoCLOPramide (REGLAN) 10 MG tablet   Oral   Take 1 tablet (10 mg total) by mouth every 6 (six) hours as needed for nausea (nausea/headache).   6 tablet   0   . ondansetron (ZOFRAN ODT) 8 MG disintegrating tablet      8mg  ODT q4 hours prn nausea   4 tablet   0   . oxyCODONE-acetaminophen (PERCOCET) 5-325 MG per tablet   Oral   Take 2 tablets by mouth every 6 (six) hours as needed for severe pain.   20 tablet   0    BP 131/59  Pulse 86  Temp(Src) 98.1 F (36.7 C) (Oral)  Resp 19  SpO2 97% Physical Exam  Nursing note and vitals reviewed. Constitutional:  Awake, alert, nontoxic appearance with baseline speech for patient.  HENT:  Mouth/Throat: No  oropharyngeal exudate.  2cm right forehead lac; 2cm chin lac; no FB noted no deep structure involvement except right forehead arterial bleed controlled with pressure; no bony facial tenderness  Eyes: EOM are normal. Pupils are equal, round, and reactive to light. Right eye exhibits no discharge. Left eye exhibits no discharge.  Neck: Neck supple.  Cardiovascular: Normal rate and regular rhythm.   No murmur heard. Pulmonary/Chest: Effort normal and breath sounds normal. No stridor. No respiratory distress. She has no wheezes. She has no rales. She exhibits no tenderness.  Abdominal: Soft. Bowel sounds are normal. She exhibits no mass. There is no tenderness. There is no rebound.  Musculoskeletal: She exhibits no tenderness.  Baseline ROM, moves extremities with no obvious new focal weakness.  Lymphadenopathy:    She has no cervical adenopathy.  Neurological: She is alert.  Awake, alert, cooperative and aware of situation; motor strength 5/5 bilaterally; sensation normal to light touch bilaterally; peripheral visual fields full to confrontation; no facial asymmetry; tongue midline; major cranial nerves appear intact; no pronator drift, normal finger to nose bilaterally  Skin: No rash noted.  Psychiatric: She has a normal mood and affect.    ED Course  Procedures (including critical care time) I saw and evaluated the patient, reviewed the resident's note and I agree with the findings and plan. See resident note for lacs x 2 repaired; I was present and assisted.  Pt stable in ED with no significant deterioration in condition.Patient / Family / Caregiver informed of clinical course, understand medical decision-making process, and agree with plan.  EKG Interpretation    Date/Time:  Thursday January 18 2013 09:57:44 EST Ventricular Rate:  76 PR Interval:  141 QRS Duration: 100 QT Interval:  392 QTC Calculation: 441 R Axis:   11 Text Interpretation:  Sinus rhythm Atrial premature complex  Abnormal R-wave progression, early transition No previous ECGs available Confirmed by [facility]  MD, Marjo Grosvenor ([id number]) on 01/18/2013 10:06:27 AM            Labs Review Labs Reviewed  COMPREHENSIVE METABOLIC PANEL - Abnormal; Notable  for the following:    Glucose, Bld 128 (*)    Calcium 8.2 (*)    AST 39 (*)    ALT 38 (*)    GFR calc non Af Amer 85 (*)    All other components within normal limits  POCT I-STAT, CHEM 8 - Abnormal; Notable for the following:    Glucose, Bld 130 (*)    All other components within normal limits  CBC WITH DIFFERENTIAL   Imaging Review Dg Chest 1 View  01/18/2013   CLINICAL DATA:  Cough, fever, runny nose for 2 days  EXAM: CHEST - 1 VIEW  COMPARISON:  None.  FINDINGS: The heart size and mediastinal contours are within normal limits. Both lungs are clear. The visualized skeletal structures are unremarkable.  IMPRESSION: No active disease.   Electronically Signed   By: Kathreen Devoid   On: 01/18/2013 11:26   Ct Head Wo Contrast  01/18/2013   CLINICAL DATA:  Fall trauma to right forehead  EXAM: CT HEAD WITHOUT CONTRAST  CT CERVICAL SPINE WITHOUT CONTRAST  TECHNIQUE: Multidetector CT imaging of the head and cervical spine was performed following the standard protocol without intravenous contrast. Multiplanar CT image reconstructions of the cervical spine were also generated.  COMPARISON:  None.  FINDINGS: CT HEAD FINDINGS  Scalp hematoma superior lateral to the right orbit measuring 18 mm depth. There is no evidence of associated skull fracture or orbital fracture. Right orbit and left orbit are normal as well as intraconal contents.  No intracranial hemorrhage. No parenchymal contusion. No midline shift or mass effect. Basilar cisterns are patent. No skull base fracture. No fluid in the paranasal sinuses or mastoid air cells.  CT CERVICAL SPINE FINDINGS  No prevertebral soft tissue swelling. Normal alignment of cervical vertebral bodies. No loss of vertebral body height. Normal  facet articulation. Normal craniocervical junction.  No evidence epidural or paraspinal hematoma.  There is endplate spurring and joint space narrowing from C5 through C7. Multiple levels of facet hypertrophy are noted.  There is pleural parenchymal thickening at the lung apices without focal nodularity.  IMPRESSION: 1. Scalp hematoma superior to the right orbit. 2. No evidence of fracture or intracranial trauma. 3. No cervical spine fracture. Degenerative changes of the cervical spine most severe from C5-C7. 4. Pleural-parenchymal thickening in the lung apices without focal nodularity.   Electronically Signed   By: Suzy Bouchard M.D.   On: 01/18/2013 12:30   Ct Cervical Spine Wo Contrast  01/18/2013   CLINICAL DATA:  Fall trauma to right forehead  EXAM: CT HEAD WITHOUT CONTRAST  CT CERVICAL SPINE WITHOUT CONTRAST  TECHNIQUE: Multidetector CT imaging of the head and cervical spine was performed following the standard protocol without intravenous contrast. Multiplanar CT image reconstructions of the cervical spine were also generated.  COMPARISON:  None.  FINDINGS: CT HEAD FINDINGS  Scalp hematoma superior lateral to the right orbit measuring 18 mm depth. There is no evidence of associated skull fracture or orbital fracture. Right orbit and left orbit are normal as well as intraconal contents.  No intracranial hemorrhage. No parenchymal contusion. No midline shift or mass effect. Basilar cisterns are patent. No skull base fracture. No fluid in the paranasal sinuses or mastoid air cells.  CT CERVICAL SPINE FINDINGS  No prevertebral soft tissue swelling. Normal alignment of cervical vertebral bodies. No loss of vertebral body height. Normal facet articulation. Normal craniocervical junction.  No evidence epidural or paraspinal hematoma.  There is endplate spurring and joint space  narrowing from C5 through C7. Multiple levels of facet hypertrophy are noted.  There is pleural parenchymal thickening at the lung  apices without focal nodularity.  IMPRESSION: 1. Scalp hematoma superior to the right orbit. 2. No evidence of fracture or intracranial trauma. 3. No cervical spine fracture. Degenerative changes of the cervical spine most severe from C5-C7. 4. Pleural-parenchymal thickening in the lung apices without focal nodularity.   Electronically Signed   By: Suzy Bouchard M.D.   On: 01/18/2013 12:30    EKG Interpretation    Date/Time:  Thursday January 18 2013 09:57:44 EST Ventricular Rate:  76 PR Interval:  141 QRS Duration: 100 QT Interval:  392 QTC Calculation: 441 R Axis:   11 Text Interpretation:  Sinus rhythm Atrial premature complex Abnormal R-wave progression, early transition No previous ECGs available Confirmed by [facility]  MD, Vanecia Limpert ([id number]) on 01/18/2013 10:06:27 AM            MDM   1. Influenza   2. Forehead laceration, initial encounter   3. Chin laceration, initial encounter   4. Concussion, with loss of consciousness of 30 minutes or less, initial encounter   5. Syncope    I doubt any other EMC precluding discharge at this time including, but not necessarily limited to the following:sepsis, Vtach, cardiac syncope.    Babette Relic, MD 01/18/13 [phone]

## 2013-01-18 NOTE — ED Notes (Signed)
Pt c/o nausea and increased pain.  Dr Stevie Kern notified.

## 2013-01-18 NOTE — Discharge Instructions (Signed)
Antibiotic Nonuse  Your caregiver felt that the infection or problem was not one that would be helped with an antibiotic. Infections may be caused by viruses or bacteria. Only a caregiver can tell which one of these is the likely cause of an illness. A cold is the most common cause of infection in both adults and children. A cold is a virus. Antibiotic treatment will have no effect on a viral infection. Viruses can lead to many lost days of work caring for sick children and many missed days of school. Children may catch as many as 10 "colds" or "flus" per year during which they can be tearful, cranky, and uncomfortable. The goal of treating a virus is aimed at keeping the ill person comfortable. Antibiotics are medications used to help the body fight bacterial infections. There are relatively few types of bacteria that cause infections but there are hundreds of viruses. While both viruses and bacteria cause infection they are very different types of germs. A viral infection will typically go away by itself within 7 to 10 days. Bacterial infections may spread or get worse without antibiotic treatment. Examples of bacterial infections are:  Sore throats (like strep throat or tonsillitis).  Infection in the lung (pneumonia).  Ear and skin infections. Examples of viral infections are:  Colds or flus.  Most coughs and bronchitis.  Sore throats not caused by Strep.  Runny noses. It is often best not to take an antibiotic when a viral infection is the cause of the problem. Antibiotics can kill off the helpful bacteria that we have inside our body and allow harmful bacteria to start growing. Antibiotics can cause side effects such as allergies, nausea, and diarrhea without helping to improve the symptoms of the viral infection. Additionally, repeated uses of antibiotics can cause bacteria inside of our body to become resistant. That resistance can be passed onto harmful bacterial. The next time you have  an infection it may be harder to treat if antibiotics are used when they are not needed. Not treating with antibiotics allows our own immune system to develop and take care of infections more efficiently. Also, antibiotics will work better for us when they are prescribed for bacterial infections. Treatments for a child that is ill may include:  Give extra fluids throughout the day to stay hydrated.  Get plenty of rest.  Only give your child over-the-counter or prescription medicines for pain, discomfort, or fever as directed by your caregiver.  The use of a cool mist humidifier may help stuffy noses.  Cold medications if suggested by your caregiver. Your caregiver may decide to start you on an antibiotic if:  The problem you were seen for today continues for a longer length of time than expected.  You develop a secondary bacterial infection. SEEK MEDICAL CARE IF:  Fever lasts longer than 5 days.  Symptoms continue to get worse after 5 to 7 days or become severe.  Difficulty in breathing develops.  Signs of dehydration develop (poor drinking, rare urinating, dark colored urine).  Changes in behavior or worsening tiredness (listlessness or lethargy). Document Released: 03/15/2001 Document Revised: 03/29/2011 Document Reviewed: 09/11/2008 [facility] Patient Information 2014 [place]ExitCare, [place].  FOREHEAD SUTURES [facility].  A laceration is a cut or lesion that goes through all layers of the skin and into the tissue just beneath the skin. This may have been repaired by your caregiver.  SEEK MEDICAL ATTENTION IF: There is redness, swelling, increasing pain in the  wound  There is a red line that goes up your arm or leg.  Pus is coming from wound.  You develop an unexplained temperature above 100.4.  You notice a foul smell coming from the wound or dressing.  There is a breaking open of the wound (edges not staying together) after sutures have been removed. If  you did not receive a tetanus shot today because you thought you were up to date, but did not recall when your last one was given, make sure to check with your primary caregiver to determine if you need one.  SEEK IMMEDIATE MEDICAL ATTENTION IF: There is redness, swelling, increasing pain in the wound, or a red line that goes up your arm or leg.  Pus is coming from wound.  An unexplained temperature above 100.4 develops.  You notice a foul smell coming from the wound from beneath the Dermabond.  There is a breaking open of the wound (edged not staying together) and the Dermabond breaks open.  You have had a head injury which does not appear to require admission at this time. A concussion is a state of changed mental ability from trauma. SEEK IMMEDIATE MEDICAL ATTENTION IF: There is confusion or drowsiness (although children frequently become drowsy after injury).  You cannot awaken the injured person.  There is nausea (feeling sick to your stomach) or continued, forceful vomiting.  You notice dizziness or unsteadiness which is getting worse, or inability to walk.  You have convulsions or unconsciousness.  You experience severe, persistent headaches not relieved by Tylenol?. (Do not take aspirin as this impairs clotting abilities). Take other pain medications only as directed.  You cannot use arms or legs normally.  There are changes in pupil sizes. (This is the black center in the colored part of the eye)  There is clear or bloody discharge from the nose or ears.  Change in speech, vision, swallowing, or understanding.  Localized weakness, numbness, tingling, or change in bowel or bladder control.  Your caregiver has seen you today because you are having problems with feelings of weakness, dizziness, and/or fatigue. Weakness has many different causes, some of which are common and others are very rare. Your caregiver has considered some of the most common causes of weakness and feels it is safe  for you to go home and be observed. Not every illness or injury can be identified during an emergency department visit, thus follow-up with your primary healthcare provider is important. Medical conditions can also worsen, so it is also important to return immediately as directed below, or if you have other serious concerns develop. RETURN IMMEDIATELY IF you develop new shortness of breath, chest pain, fever, have difficulty moving parts of your body (new weakness, numbness, or incoordination), sudden change in speech, vision, swallowing, or understanding, faint or develop new dizziness, severe headache, become poorly responsive or have an altered mental status compared to baseline for you, new rash, abdominal pain, or bloody stools,  Return sooner also if you develop new problems for which you have not talked to your caregiver but you feel may be emergency medical conditions, or are unable to be cared for safely at home.  You appear to have an upper respiratory infection (URI). An upper respiratory tract infection, or cold, is a viral infection of the air passages leading to the lungs. It is contagious and can be spread to others, especially during the first 3 or 4 days. It cannot be cured by antibiotics or other medicines. RETURN IMMEDIATELY  IF you develop shortness of breath, confusion or altered mental status, a new rash, become dizzy, faint, or poorly responsive, or are unable to be cared for at home.

## 2013-01-18 NOTE — ED Notes (Signed)
Pt sat up in bed and stated she felt like she was going to pass out.  Pt laid back down and rested for several seconds before feeling passed.  No change on cardiac monitor and no increase or decrease in pulse.  Per family, this is the norm for pt.  Dr Stevie Kern notified.

## 2013-01-18 NOTE — ED Notes (Signed)
Called CT.  They stated 3 people in front of pt.

## 2013-01-22 ENCOUNTER — Other Ambulatory Visit: Payer: Self-pay

## 2013-01-22 MED ORDER — METOCLOPRAMIDE HCL 10 MG PO TABS: 10.0000 mg | ORAL_TABLET | Freq: Four times a day (QID) | ORAL | Status: DC | PRN

## 2013-01-22 MED ORDER — OXYCODONE-ACETAMINOPHEN 5-325 MG PO TABS: 1.0000 | ORAL_TABLET | Freq: Four times a day (QID) | ORAL | Status: DC | PRN

## 2013-01-22 NOTE — Telephone Encounter (Signed)
Spoke to pt and informed her Rx is available for pick up at the front desk; pt informed gov't issued photo id needed for pickup

## 2013-01-22 NOTE — Telephone Encounter (Signed)
Mr Pester request rx oxycodone, neck pain and  Rt shoulder pain from fall in bathroom. Pt also request Reglan for h/a to [facility]. Pt has f/u ED appt on 01/24/13 with Webb Silversmith NP. Mr Tangredi request cb.

## 2013-01-22 NOTE — Telephone Encounter (Signed)
Has appt with Rollene Fare in 2 days.  Ok to refill only 10 tablets of oxycodone for now.

## 2013-01-24 ENCOUNTER — Ambulatory Visit (INDEPENDENT_AMBULATORY_CARE_PROVIDER_SITE_OTHER): Payer: Medicare Other | Admitting: Internal Medicine

## 2013-01-24 ENCOUNTER — Encounter: Payer: Self-pay | Admitting: Internal Medicine

## 2013-01-24 VITALS — BP 118/76 | HR 56 | Temp 97.3°F | Wt 184.8 lb

## 2013-01-24 DIAGNOSIS — Z5189 Encounter for other specified aftercare: Secondary | ICD-10-CM

## 2013-01-24 DIAGNOSIS — S0191XD Laceration without foreign body of unspecified part of head, subsequent encounter: Secondary | ICD-10-CM

## 2013-01-24 MED ORDER — OXYCODONE-ACETAMINOPHEN 5-325 MG PO TABS: 1.0000 | ORAL_TABLET | Freq: Four times a day (QID) | ORAL | Status: DC | PRN

## 2013-01-24 NOTE — Progress Notes (Signed)
Pre-visit discussion using our clinic review tool. No additional management support is needed unless otherwise documented below in the visit note.  

## 2013-01-24 NOTE — Patient Instructions (Signed)
Sutured Wound Care °Sutures are stitches that can be used to close wounds. Wound care helps prevent pain and infection.  °HOME CARE INSTRUCTIONS  °· Rest and elevate the injured area until all the pain and swelling are gone. °· Only take over-the-counter or prescription medicines for pain, discomfort, or fever as directed by your caregiver. °· After 48 hours, gently wash the area with mild soap and water once a day, or as directed. Rinse off the soap. Pat the area dry with a clean towel. Do not rub the wound. This may cause bleeding. °· Follow your caregiver's instructions for how often to change the bandage (dressing). Stop using a dressing after 2 days or after the wound stops draining. °· If the dressing sticks, moisten it with soapy water and gently remove it. °· Apply ointment on the wound as directed. °· Avoid stretching a sutured wound. °· Drink enough fluids to keep your urine clear or pale yellow. °· Follow up with your caregiver for suture removal as directed. °· Use sunscreen on your wound for the next 3 to 6 months so the scar will not darken. °SEEK IMMEDIATE MEDICAL CARE IF:  °· Your wound becomes red, swollen, hot, or tender. °· You have increasing pain in the wound. °· You have a red streak that extends from the wound. °· There is pus coming from the wound. °· You have a fever. °· You have shaking chills. °· There is a bad smell coming from the wound. °· You have persistent bleeding from the wound. °MAKE SURE YOU:  °· Understand these instructions. °· Will watch your condition. °· Will get help right away if you are not doing well or get worse. °Document Released: 02/12/2004 Document Revised: 03/29/2011 Document Reviewed: 05/10/2010 °Exit[employer]® Patient Information ©2014 [employer]. ° °

## 2013-01-24 NOTE — Progress Notes (Signed)
Subjective:    Patient ID: [name], female    DOB: 1940-08-15, 73 y.o.   MRN: [id number]  HPI  Pt presents to the clinic today for suture removal. She fell in her bathroom 1 week ago hitting the right side of her face and chin. She has 2 sutures above the eyebrow and 2 dissolvable sutures in the chin covered with dermabond. She is here to have them removed today.  Review of Systems      Past Medical History  Diagnosis Date  . Venous stasis ulcer 11/1997    [facility], [place]  . Hypothyroidism   . Hypercholesteremia   . Back pain   . Migraine     Current Outpatient Prescriptions  Medication Sig Dispense Refill  . aspirin EC 81 MG tablet Take 81 mg by mouth daily.      [name] atenolol (TENORMIN) 50 MG tablet Take 50 mg by mouth daily.      . butalbital-acetaminophen-caffeine (FIORICET) 50-325-40 MG per tablet Take one tablet by mouth every 4 hours as needed.  Do not exceed 6 tablets per day  60 tablet  0  . Cholecalciferol (VITAMIN D-3) 1000 UNITS CAPS Take 1 capsule by mouth daily.      [name] docusate sodium (COLACE) 100 MG capsule Take 100 mg by mouth 2 (two) times daily.      [name] estradiol (ESTRACE) 1 MG tablet Take 1 mg by mouth daily.      [name] levothyroxine (SYNTHROID, LEVOTHROID) 125 MCG tablet Take 125 mcg by mouth daily before breakfast.      . metoCLOPramide (REGLAN) 10 MG tablet Take 1 tablet (10 mg total) by mouth every 6 (six) hours as needed for nausea (nausea/headache).  6 tablet  0  . Multiple Vitamin (MULTIVITAMIN) tablet Take 1 tablet by mouth daily.        [name] omega-3 acid ethyl esters (LOVAZA) 1 G capsule Take 1 g by mouth daily.      . ondansetron (ZOFRAN ODT) 8 MG disintegrating tablet 8mg  ODT q4 hours prn nausea  4 tablet  0  . oxyCODONE-acetaminophen (PERCOCET) 5-325 MG per tablet Take 1 tablet by mouth every 6 (six) hours as needed for severe pain.  20 tablet  0  . simvastatin (ZOCOR) 40 MG tablet Take 40 mg by mouth at bedtime.      . SUMAtriptan  (IMITREX) 20 MG/ACT nasal spray Place 1 spray into the nose as needed.         No current facility-administered medications for this visit.    Allergies  Allergen Reactions  . Codeine     REACTION: NAUSEA AND VOMITING  . Tramadol     Vomiting, LOC    Family History  Problem Relation Age of Onset  . Stroke Mother   . Stomach cancer Sister   . Heart attack Father   . Heart attack Brother   . Colon cancer Neg Hx   . Esophageal cancer Neg Hx     History   Social History  . Marital Status: Married    Spouse Name: N/A    Number of Children: 2  . Years of Education: N/A   Occupational History  . Retired    Social History Main Topics  . Smoking status: Never Smoker   . Smokeless tobacco: Not on file  . Alcohol Use: No  . Drug Use: No  . Sexual Activity: Not on file   Other Topics Concern  . Not on file  Social History Narrative   Lives with husband     Constitutional: Pt reports headaches. Denies fever, malaise, fatigue, or abrupt weight changes.  HEENT: Denies eye pain, eye redness, ear pain, ringing in the ears, wax buildup, runny nose, nasal congestion, bloody nose, or sore throat. Skin: Denies redness, rashes, lesions or ulcercations.  Neurological: Denies dizziness, difficulty with memory, difficulty with speech or problems with balance and coordination.   No other specific complaints in a complete review of systems (except as listed in HPI above).  Objective:   Physical Exam  BP 118/76  Pulse 56  Temp(Src) 97.3 F (36.3 C) (Oral)  Wt 184 lb 12.8 oz (83.825 kg)  SpO2 98% Wt Readings from Last 3 Encounters:  01/24/13 184 lb 12.8 oz (83.825 kg)  09/11/12 193 lb (87.544 kg)  05/08/12 197 lb (89.359 kg)    General: Appears her stated age, well developed, well nourished in NAD. Skin: Warm, dry and intact. No rashes, lesions or ulcerations noted. Massive amount of bruising noted on the right side of the face. Upon cleaning the eyebrow lac, 2 sutures  intact with 0.5 cm hole noted, oozing old blood. Cardiovascular: Normal rate and rhythm. S1,S2 noted.  No murmur, rubs or gallops noted. No JVD or BLE edema. No carotid bruits noted. Pulmonary/Chest: Normal effort and positive vesicular breath sounds. No respiratory distress. No wheezes, rales or ronchi noted.  Neurological: Alert and oriented. Cranial nerves II-XII intact. Coordination normal. +DTRs bilaterally.   BMET    Component Value Date/Time   NA 140 01/18/2013 1226   K 3.7 01/18/2013 1226   CL 103 01/18/2013 1226   CO2 25 01/18/2013 1000   GLUCOSE 130* 01/18/2013 1226   BUN 9 01/18/2013 1226   CREATININE 0.70 01/18/2013 1226   CALCIUM 8.2* 01/18/2013 1000   GFRNONAA 85* 01/18/2013 1000   GFRAA >90 01/18/2013 1000    Lipid Panel     Component Value Date/Time   CHOL 169 01/17/2012 0803   TRIG 202.0* 01/17/2012 0803   HDL 50.90 01/17/2012 0803   CHOLHDL 3 01/17/2012 0803   VLDL 40.4* 01/17/2012 0803   LDLCALC 55 08/25/2010 0949    CBC    Component Value Date/Time   WBC 5.8 01/18/2013 1000   RBC 4.28 01/18/2013 1000   HGB 13.9 01/18/2013 1226   HCT 41.0 01/18/2013 1226   PLT 222 01/18/2013 1000   MCV 94.9 01/18/2013 1000   MCH 32.7 01/18/2013 1000   MCHC 34.5 01/18/2013 1000   RDW 13.2 01/18/2013 1000   LYMPHSABS 0.8 01/18/2013 1000   MONOABS 0.5 01/18/2013 1000   EOSABS 0.1 01/18/2013 1000   BASOSABS 0.0 01/18/2013 1000    Hgb A1C No results found for this basename: HGBA1C         Assessment & Plan:   Encounter for suture removal:  After cleansing the wound thoroughly with warm water and soap Laceration appears not to be fully intact I discussed this with Dr. Lorelei Pont, who came in and evaluated the sutures with me. He agress to wait to take the sutures out. Continue to cleanse and home with soap and water, cover with neosporin and RTC on Monday for reevaluation to see if the stitches can be taken out.  RTC in 5 days or sooner if needed

## 2013-01-29 ENCOUNTER — Encounter: Payer: Self-pay | Admitting: Family Medicine

## 2013-01-29 ENCOUNTER — Ambulatory Visit (INDEPENDENT_AMBULATORY_CARE_PROVIDER_SITE_OTHER): Payer: Medicare Other | Admitting: Family Medicine

## 2013-01-29 VITALS — BP 136/78 | HR 68 | Temp 98.0°F | Wt 184.5 lb

## 2013-01-29 DIAGNOSIS — Z4802 Encounter for removal of sutures: Secondary | ICD-10-CM

## 2013-01-29 MED ORDER — METOCLOPRAMIDE HCL 10 MG PO TABS: 10.0000 mg | ORAL_TABLET | Freq: Four times a day (QID) | ORAL | Status: DC | PRN

## 2013-01-29 MED ORDER — OXYCODONE-ACETAMINOPHEN 5-325 MG PO TABS: 1.0000 | ORAL_TABLET | Freq: Four times a day (QID) | ORAL | Status: DC | PRN

## 2013-01-29 NOTE — Patient Instructions (Signed)
Good to see you. Keep me updated with your symptoms.

## 2013-01-29 NOTE — Progress Notes (Signed)
Pre-visit discussion using our clinic review tool. No additional management support is needed unless otherwise documented below in the visit note.  

## 2013-01-29 NOTE — Progress Notes (Signed)
Subjective:    Patient ID: [name], female    DOB: [birth year], 73 y.o.   MRN: [id number]  HPI  Pt presents to the clinic today for suture removal. She fell in her bathroom 11 days ago hitting the right side of her face and chin. She has 2 sutures above the eyebrow and 2 dissolvable sutures in the chin.. She is here to have them removed today.  Denies pain Bleeding has stopped.  Review of Systems    See HPI No fevers  Past Medical History  Diagnosis Date  . Venous stasis ulcer 11/1997    Wound care center, [place]  . Hypothyroidism   . Hypercholesteremia   . Back pain   . Migraine     Current Outpatient Prescriptions  Medication Sig Dispense Refill  . aspirin EC 81 MG tablet Take 81 mg by mouth daily.      [name] atenolol (TENORMIN) 50 MG tablet Take 50 mg by mouth daily.      . butalbital-acetaminophen-caffeine (FIORICET) 50-325-40 MG per tablet Take one tablet by mouth every 4 hours as needed.  Do not exceed 6 tablets per day  60 tablet  0  . Cholecalciferol (VITAMIN D-3) 1000 UNITS CAPS Take 1 capsule by mouth daily.      [name] docusate sodium (COLACE) 100 MG capsule Take 100 mg by mouth 2 (two) times daily.      [name] estradiol (ESTRACE) 1 MG tablet Take 1 mg by mouth daily.      [name] levothyroxine (SYNTHROID, LEVOTHROID) 125 MCG tablet Take 125 mcg by mouth daily before breakfast.      . metoCLOPramide (REGLAN) 10 MG tablet Take 1 tablet (10 mg total) by mouth every 6 (six) hours as needed for nausea (nausea/headache).  30 tablet  3  . Multiple Vitamin (MULTIVITAMIN) tablet Take 1 tablet by mouth daily.        [name] omega-3 acid ethyl esters (LOVAZA) 1 G capsule Take 1 g by mouth daily.      . ondansetron (ZOFRAN ODT) 8 MG disintegrating tablet 8mg  ODT q4 hours prn nausea  4 tablet  0  . oxyCODONE-acetaminophen (PERCOCET) 5-325 MG per tablet Take 1 tablet by mouth every 6 (six) hours as needed for severe pain.  20 tablet  0  . simvastatin (ZOCOR) 40 MG tablet Take 40 mg by mouth  at bedtime.      . SUMAtriptan (IMITREX) 20 MG/ACT nasal spray Place 1 spray into the nose as needed.         No current facility-administered medications for this visit.    Allergies  Allergen Reactions  . Codeine     REACTION: NAUSEA AND VOMITING  . Tramadol     Vomiting, LOC    Family History  Problem Relation Age of Onset  . Stroke Mother   . Stomach cancer Sister   . Heart attack Father   . Heart attack Brother   . Colon cancer Neg Hx   . Esophageal cancer Neg Hx     History   Social History  . Marital Status: Married    Spouse Name: N/A    Number of Children: 2  . Years of Education: N/A   Occupational History  . Retired    Social History Main Topics  . Smoking status: Never Smoker   . Smokeless tobacco: Not on file  . Alcohol Use: No  . Drug Use: No  . Sexual Activity: Not on file   Other Topics  Concern  . Not on file   Social History Narrative   Lives with husband      Objective:   Physical Exam  BP 136/78  Pulse 68  Temp(Src) 98 F (36.7 C) (Oral)  Wt 184 lb 8 oz (83.689 kg)  SpO2 97% Wt Readings from Last 3 Encounters:  01/29/13 184 lb 8 oz (83.689 kg)  01/24/13 184 lb 12.8 oz (83.825 kg)  09/11/12 193 lb (87.544 kg)    General: Appears her stated age, well developed, well nourished in NAD. Skin: Warm, dry and intact. No rashes, lesions or ulcerations noted. Massive amount of bruising noted on the right side of the face. Upon cleaning the eyebrow lac, 2 sutures intact with 0.5 cm hole noted, no oozing Cardiovascular: Normal rate and rhythm. S1,S2 noted.  No murmur, rubs or gallops noted. No JVD or BLE edema. No carotid bruits noted. Pulmonary/Chest: Normal effort and positive vesicular breath sounds. No respiratory distress. No wheezes, rales or ronchi noted.  Neurological: Alert and oriented. Cranial nerves II-XII intact. Coordination normal. +DTRs bilaterally.   BMET    Component Value Date/Time   NA 140 01/18/2013 1226   K 3.7  01/18/2013 1226   CL 103 01/18/2013 1226   CO2 25 01/18/2013 1000   GLUCOSE 130* 01/18/2013 1226   BUN 9 01/18/2013 1226   CREATININE 0.70 01/18/2013 1226   CALCIUM 8.2* 01/18/2013 1000   GFRNONAA 85* 01/18/2013 1000   GFRAA >90 01/18/2013 1000    Lipid Panel     Component Value Date/Time   CHOL 169 01/17/2012 0803   TRIG 202.0* 01/17/2012 0803   HDL 50.90 01/17/2012 0803   CHOLHDL 3 01/17/2012 0803   VLDL 40.4* 01/17/2012 0803   LDLCALC 55 08/25/2010 0949    CBC    Component Value Date/Time   WBC 5.8 01/18/2013 1000   RBC 4.28 01/18/2013 1000   HGB 13.9 01/18/2013 1226   HCT 41.0 01/18/2013 1226   PLT 222 01/18/2013 1000   MCV 94.9 01/18/2013 1000   MCH 32.7 01/18/2013 1000   MCHC 34.5 01/18/2013 1000   RDW 13.2 01/18/2013 1000   LYMPHSABS 0.8 01/18/2013 1000   MONOABS 0.5 01/18/2013 1000   EOSABS 0.1 01/18/2013 1000   BASOSABS 0.0 01/18/2013 1000    Hgb A1C No results found for this basename: HGBA1C         Assessment & Plan:   Encounter for suture removal:  Sutures removed with scissors Area cleaned and steri strips placed over middle area. Call or return to clinic prn if these symptoms worsen or fail to improve as anticipated. The patient indicates understanding of these issues and agrees with the plan.

## 2013-02-21 ENCOUNTER — Ambulatory Visit (INDEPENDENT_AMBULATORY_CARE_PROVIDER_SITE_OTHER): Payer: Medicare Other | Admitting: Internal Medicine

## 2013-02-21 ENCOUNTER — Encounter: Payer: Self-pay | Admitting: Internal Medicine

## 2013-02-21 ENCOUNTER — Telehealth: Payer: Self-pay

## 2013-02-21 VITALS — BP 126/74 | HR 65 | Temp 97.5°F | Wt 187.0 lb

## 2013-02-21 DIAGNOSIS — S0190XA Unspecified open wound of unspecified part of head, initial encounter: Secondary | ICD-10-CM

## 2013-02-21 DIAGNOSIS — S0191XA Laceration without foreign body of unspecified part of head, initial encounter: Secondary | ICD-10-CM

## 2013-02-21 NOTE — Progress Notes (Signed)
HPI: Pt presents to the office today with concerns regarding a healing laceration to her right eye. Pt fell approx a month ago and hit her head, went to the ER and received sutures, however one area was not sutured right over her right eyebrow.  Pt endorses yellow drainage, denies pain, redness, or swelling of area. Pt has not tried anything OTC.   Past Medical History  Diagnosis Date  . Venous stasis ulcer 11/1997    [facility] care center, [place]  . Hypothyroidism   . Hypercholesteremia   . Back pain   . Migraine     Current Outpatient Prescriptions  Medication Sig Dispense Refill  . aspirin EC 81 MG tablet Take 81 mg by mouth daily.      [name] atenolol (TENORMIN) 50 MG tablet Take 50 mg by mouth daily.      . butalbital-acetaminophen-caffeine (FIORICET) 50-325-40 MG per tablet Take one tablet by mouth every 4 hours as needed.  Do not exceed 6 tablets per day  60 tablet  0  . Cholecalciferol (VITAMIN D-3) 1000 UNITS CAPS Take 1 capsule by mouth daily.      [name] docusate sodium (COLACE) 100 MG capsule Take 100 mg by mouth 2 (two) times daily.      [name] estradiol (ESTRACE) 1 MG tablet Take 1 mg by mouth daily.      [name] levothyroxine (SYNTHROID, LEVOTHROID) 125 MCG tablet Take 125 mcg by mouth daily before breakfast.      . Multiple Vitamin (MULTIVITAMIN) tablet Take 1 tablet by mouth daily.        [name] omega-3 acid ethyl esters (LOVAZA) 1 G capsule Take 1 g by mouth daily.      . simvastatin (ZOCOR) 40 MG tablet Take 40 mg by mouth at bedtime.      . SUMAtriptan (IMITREX) 20 MG/ACT nasal spray Place 1 spray into the nose as needed.         No current facility-administered medications for this visit.    Allergies  Allergen Reactions  . Codeine     REACTION: NAUSEA AND VOMITING  . Tramadol     Vomiting, LOC    Family History  Problem Relation Age of Onset  . Stroke Mother   . Stomach cancer Sister   . Heart attack Father   . Heart attack Brother   . Colon cancer Neg Hx   . Esophageal cancer  Neg Hx     History   Social History  . Marital Status: Married    Spouse Name: N/A    Number of Children: 2  . Years of Education: N/A   Occupational History  . Retired    Social History Main Topics  . Smoking status: Never Smoker   . Smokeless tobacco: Not on file  . Alcohol Use: No  . Drug Use: No  . Sexual Activity: Not on file   Other Topics Concern  . Not on file   Social History Narrative   Lives with husband    ROS:  Constitutional: Denies fever, malaise, fatigue, headache or abrupt weight changes.  HEENT: Denies eye pain, eye redness, or swelling.  Skin:Endorses yellow discharge from open area above right eye.  Denies redness, rashes, lesions or ulcercations.  Neurological: Denies dizziness, difficulty with memory, difficulty with speech or problems with balance and coordination.   No other specific complaints in a complete review of systems (except as listed in HPI above).  PE:  BP 126/74  Pulse 65  Temp(Src) 97.5  F (36.4 C) (Oral)  Wt 187 lb (84.823 kg)  SpO2 99% Wt Readings from Last 3 Encounters:  02/21/13 187 lb (84.823 kg)  01/29/13 184 lb 8 oz (83.689 kg)  01/24/13 184 lb 12.8 oz (83.825 kg)    General: Appears their stated age, well developed, well nourished in NAD. HEENT: Head: normal shape and size; Eyes: sclera white, no icterus, conjunctiva pink, PERRLA and EOMs intact; Skin: Pea sized healing wound above right eye; area is pink, with no drainage, area was scabbed, removed with light tough. No redness or swelling to surrounding area.  Neurological: Alert and oriented. Cranial nerves II-XII intact. Coordination normal. +DTRs bilaterally. Psychiatric: Mood and affect normal. Behavior is normal. Judgment and thought content normal.   Assessment and Plan: Wound healing Area looks better than previous visit Recommended OTC antibiotic ointment to area three times a day Keep area clean with soap and water Follow up in 1-2 weeks if no  improvement  Wessley Emert S, Student-NP

## 2013-02-21 NOTE — Progress Notes (Signed)
Subjective:    Patient ID: [name], female    DOB: 28-Sep-1940, 73 y.o.   MRN: [id number]  HPI  Pt presents to the clinic today with c/o drainage from and laceration above her right eye. She reports she noticed the yellow drainage 1 week ago after having the sutures removed. She denies fever, redness or warmth around the laceration site.  Review of Systems      Past Medical History  Diagnosis Date  . Venous stasis ulcer 11/1997    [facility]ound care center, [place]  . Hypothyroidism   . Hypercholesteremia   . Back pain   . Migraine     Current Outpatient Prescriptions  Medication Sig Dispense Refill  . aspirin EC 81 MG tablet Take 81 mg by mouth daily.      [name] atenolol (TENORMIN) 50 MG tablet Take 50 mg by mouth daily.      . butalbital-acetaminophen-caffeine (FIORICET) 50-325-40 MG per tablet Take one tablet by mouth every 4 hours as needed.  Do not exceed 6 tablets per day  60 tablet  0  . Cholecalciferol (VITAMIN D-3) 1000 UNITS CAPS Take 1 capsule by mouth daily.      [name] docusate sodium (COLACE) 100 MG capsule Take 100 mg by mouth 2 (two) times daily.      [name] estradiol (ESTRACE) 1 MG tablet Take 1 mg by mouth daily.      [name] levothyroxine (SYNTHROID, LEVOTHROID) 125 MCG tablet Take 125 mcg by mouth daily before breakfast.      . Multiple Vitamin (MULTIVITAMIN) tablet Take 1 tablet by mouth daily.        [name] omega-3 acid ethyl esters (LOVAZA) 1 G capsule Take 1 g by mouth daily.      . simvastatin (ZOCOR) 40 MG tablet Take 40 mg by mouth at bedtime.      . SUMAtriptan (IMITREX) 20 MG/ACT nasal spray Place 1 spray into the nose as needed.         No current facility-administered medications for this visit.    Allergies  Allergen Reactions  . Codeine     REACTION: NAUSEA AND VOMITING  . Tramadol     Vomiting, LOC    Family History  Problem Relation Age of Onset  . Stroke Mother   . Stomach cancer Sister   . Heart attack Father   . Heart attack Brother   . Colon  cancer Neg Hx   . Esophageal cancer Neg Hx     History   Social History  . Marital Status: Married    Spouse Name: N/A    Number of Children: 2  . Years of Education: N/A   Occupational History  . Retired    Social History Main Topics  . Smoking status: Never Smoker   . Smokeless tobacco: Not on file  . Alcohol Use: No  . Drug Use: No  . Sexual Activity: Not on file   Other Topics Concern  . Not on file   Social History Narrative   Lives with husband     Constitutional: Denies fever, malaise, fatigue, headache or abrupt weight changes.  Respiratory: Denies difficulty breathing, shortness of breath, cough or sputum production.   Cardiovascular: Denies chest pain, chest tightness, palpitations or swelling in the hands or feet.  Skin: Pt reports laceration above right eyebrow. Denies redness, rashes, lesions or ulcercations.    No other specific complaints in a complete review of systems (except as listed in HPI above).  Objective:   Physical Exam   BP 126/74  Pulse 65  Temp(Src) 97.5 F (36.4 C) (Oral)  Wt 187 lb (84.823 kg)  SpO2 99% Wt Readings from Last 3 Encounters:  02/21/13 187 lb (84.823 kg)  01/29/13 184 lb 8 oz (83.689 kg)  01/24/13 184 lb 12.8 oz (83.825 kg)    General: Appears her stated age, well developed, well nourished in NAD. Skin: Warm, dry and intact. Small annular scabbed laceration noted above right eyebrow. No evidence of infection at this time. Pulmonary/Chest: Normal effort and positive vesicular breath sounds. No respiratory distress. No wheezes, rales or ronchi noted.    BMET    Component Value Date/Time   NA 140 01/18/2013 1226   K 3.7 01/18/2013 1226   CL 103 01/18/2013 1226   CO2 25 01/18/2013 1000   GLUCOSE 130* 01/18/2013 1226   BUN 9 01/18/2013 1226   CREATININE 0.70 01/18/2013 1226   CALCIUM 8.2* 01/18/2013 1000   GFRNONAA 85* 01/18/2013 1000   GFRAA >90 01/18/2013 1000    Lipid Panel     Component Value Date/Time   CHOL 169  01/17/2012 0803   TRIG 202.0* 01/17/2012 0803   HDL 50.90 01/17/2012 0803   CHOLHDL 3 01/17/2012 0803   VLDL 40.4* 01/17/2012 0803   LDLCALC 55 08/25/2010 0949    CBC    Component Value Date/Time   WBC 5.8 01/18/2013 1000   RBC 4.28 01/18/2013 1000   HGB 13.9 01/18/2013 1226   HCT 41.0 01/18/2013 1226   PLT 222 01/18/2013 1000   MCV 94.9 01/18/2013 1000   MCH 32.7 01/18/2013 1000   MCHC 34.5 01/18/2013 1000   RDW 13.2 01/18/2013 1000   LYMPHSABS 0.8 01/18/2013 1000   MONOABS 0.5 01/18/2013 1000   EOSABS 0.1 01/18/2013 1000   BASOSABS 0.0 01/18/2013 1000    Hgb A1C No results found for this basename: HGBA1C        Assessment & Plan:   Laceration above right eyebrow:  Drainage is not infected It appears to be healing well Ok to use triple antibiotic ointment and cover with a bandaid if needed  RTC as needed or if symptoms persist or worsen

## 2013-02-21 NOTE — Progress Notes (Signed)
Pre-visit discussion using our clinic review tool. No additional management support is needed unless otherwise documented below in the visit note.  

## 2013-02-21 NOTE — Patient Instructions (Signed)

## 2013-02-21 NOTE — Telephone Encounter (Signed)
noted 

## 2013-02-21 NOTE — Telephone Encounter (Signed)
Pt said had sutures removed and steri strips were placed over suture line on 01/29/13, steri strips have come off but pt said there is a large scab over eye where sutures were and had yellow drainage 02/20/13. No fever, redness or area does not feel warm; pt scheduled to see Webb Silversmith NP today at 1:45 pm.

## 2013-03-04 ENCOUNTER — Other Ambulatory Visit: Payer: Self-pay | Admitting: Family Medicine

## 2013-03-05 NOTE — Telephone Encounter (Signed)
Pt requesting medication refill. Last ov 01/2013 with no future appts scheduled. Medication on Hx list only. pls advise

## 2013-03-20 ENCOUNTER — Ambulatory Visit (INDEPENDENT_AMBULATORY_CARE_PROVIDER_SITE_OTHER)
Admission: RE | Admit: 2013-03-20 | Discharge: 2013-03-20 | Disposition: A | Discharge status: Home / Self Care | Payer: Medicare Other | Source: Ambulatory Visit | Attending: Family Medicine | Admitting: Family Medicine

## 2013-03-20 ENCOUNTER — Encounter: Payer: Self-pay | Admitting: Family Medicine

## 2013-03-20 ENCOUNTER — Ambulatory Visit (INDEPENDENT_AMBULATORY_CARE_PROVIDER_SITE_OTHER): Payer: Medicare Other | Admitting: Family Medicine

## 2013-03-20 VITALS — BP 138/78 | HR 71 | Temp 97.9°F | Wt 189.5 lb

## 2013-03-20 DIAGNOSIS — M25511 Pain in right shoulder: Secondary | ICD-10-CM

## 2013-03-20 DIAGNOSIS — J069 Acute upper respiratory infection, unspecified: Secondary | ICD-10-CM | POA: Insufficient documentation

## 2013-03-20 DIAGNOSIS — M25519 Pain in unspecified shoulder: Secondary | ICD-10-CM

## 2013-03-20 NOTE — Assessment & Plan Note (Signed)
Likely viral. Advised supportive care. Call or return to clinic prn if these symptoms worsen or fail to improve as anticipated.

## 2013-03-20 NOTE — Progress Notes (Signed)
SUBJECTIVE:  [name] is a 73 y.o. female who complains of coryza, congestion, sneezing, sore throat and dry cough for 5 days. She denies a history of anorexia, chest pain, chills, fatigue and fevers and denies a history of asthma. Patient denies smoke cigarettes.  Right shoulder pain- has been hurting since her fall in 01/2013.  Sometimes wakes her up at night. Difficulty raising arm above head.   Patient Active Problem List   Diagnosis Date Noted  . Acute upper respiratory infections of unspecified site 03/20/2013  . Sebaceous cyst 09/11/2012  . HTN (hypertension) 05/08/2012  . Post menopausal syndrome 01/27/2012  . HYPOTHYROIDISM 09/08/2007  . UNSPECIFIED VENOUS INSUFFICIENCY 09/08/2007   Past Medical History  Diagnosis Date  . Venous stasis ulcer 11/1997    Wound care center, [place]  . Hypothyroidism   . Hypercholesteremia   . Back pain   . Migraine    Past Surgical History  Procedure Laterality Date  . Abdominal hysterectomy      1 1/2 ovaries gone, second to endometriosis  . Appendectomy    . Thyroidectomy    . Hernia repair    . Rotator cuff repair    . Cystoscopy     History  Substance Use Topics  . Smoking status: Never Smoker   . Smokeless tobacco: Not on file  . Alcohol Use: No   Family History  Problem Relation Age of Onset  . Stroke Mother   . Stomach cancer Sister   . Heart attack Father   . Heart attack Brother   . Colon cancer Neg Hx   . Esophageal cancer Neg Hx    Allergies  Allergen Reactions  . Codeine     REACTION: NAUSEA AND VOMITING  . Tramadol     Vomiting, LOC   Current Outpatient Prescriptions on File Prior to Visit  Medication Sig Dispense Refill  . aspirin EC 81 MG tablet Take 81 mg by mouth daily.      [name] atenolol (TENORMIN) 50 MG tablet TAKE 1 TABLET BY MOUTH EVERY DAY  90 tablet  3  . butalbital-acetaminophen-caffeine (FIORICET) 50-325-40 MG per tablet Take one tablet by mouth every 4 hours as needed.  Do not exceed 6  tablets per day  60 tablet  0  . Cholecalciferol (VITAMIN D-3) 1000 UNITS CAPS Take 1 capsule by mouth daily.      [name] docusate sodium (COLACE) 100 MG capsule Take 100 mg by mouth 2 (two) times daily.      [name] estradiol (ESTRACE) 1 MG tablet Take 1 mg by mouth daily.      [name] levothyroxine (SYNTHROID, LEVOTHROID) 125 MCG tablet Take 125 mcg by mouth daily before breakfast.      . Multiple Vitamin (MULTIVITAMIN) tablet Take 1 tablet by mouth daily.        [name] omega-3 acid ethyl esters (LOVAZA) 1 G capsule Take 1 g by mouth daily.      . simvastatin (ZOCOR) 40 MG tablet Take 40 mg by mouth at bedtime.      . SUMAtriptan (IMITREX) 20 MG/ACT nasal spray Place 1 spray into the nose as needed.         No current facility-administered medications on file prior to visit.  ]The PMH, PSH, Social History, Family History, Medications, and allergies have been reviewed in [facility], and have been updated if relevant.  OBJECTIVE: BP 138/78  Pulse 71  Temp(Src) 97.9 F (36.6 C) (Oral)  Wt 189 lb 8 oz (85.957 kg)  SpO2 98%  She appears well, vital signs are as noted. Ears normal.  Throat and pharynx normal.  Neck supple. No adenopathy in the neck. Nose is congested. Sinuses non tender. The chest is clear, without wheezes or rales. +right arch sign, +/- empty can

## 2013-03-20 NOTE — Patient Instructions (Signed)
I will call you with your xray results. This is likely a virus.  Drink plenty of fluids.  Use Delsym for cough.

## 2013-03-20 NOTE — Assessment & Plan Note (Signed)
?  partial rotator cuff tear vs impingement. Shoulder xray today. May need further imaging, ortho ref.

## 2013-03-20 NOTE — Progress Notes (Signed)
Pre visit review using our clinic review tool, if applicable. No additional management support is needed unless otherwise documented below in the visit note. 

## 2013-03-25 ENCOUNTER — Other Ambulatory Visit: Payer: Self-pay | Admitting: Family Medicine

## 2013-03-26 ENCOUNTER — Encounter: Payer: Self-pay | Admitting: Family Medicine

## 2013-03-26 NOTE — Telephone Encounter (Signed)
Ok to refill one time only.  Needs labs/office visit for further refills.

## 2013-03-26 NOTE — Telephone Encounter (Signed)
Pt requesting medication refill. Last ov 03/2013 with no future appts scheduled. Meds on Hx list only. pls advise

## 2013-03-27 ENCOUNTER — Encounter: Payer: Self-pay | Admitting: *Deleted

## 2013-04-02 ENCOUNTER — Telehealth: Payer: Self-pay

## 2013-04-02 NOTE — Telephone Encounter (Signed)
Pt left v/m requesting cb. Left v/m for pt to cb. 

## 2013-04-02 NOTE — Telephone Encounter (Signed)
Pt wanted to know why she was only given # 30 on recent refills;advised pt needs to come in for appt. Pt scheduled appt 04/10/13 at 8 AM.

## 2013-04-10 ENCOUNTER — Ambulatory Visit: Payer: Medicare Other | Admitting: Family Medicine

## 2013-04-16 ENCOUNTER — Ambulatory Visit (INDEPENDENT_AMBULATORY_CARE_PROVIDER_SITE_OTHER): Payer: Medicare Other | Admitting: Family Medicine

## 2013-04-16 ENCOUNTER — Telehealth: Payer: Self-pay | Admitting: Family Medicine

## 2013-04-16 ENCOUNTER — Encounter: Payer: Self-pay | Admitting: Family Medicine

## 2013-04-16 VITALS — BP 126/64 | HR 67 | Temp 97.3°F | Wt 189.5 lb

## 2013-04-16 DIAGNOSIS — E039 Hypothyroidism, unspecified: Secondary | ICD-10-CM

## 2013-04-16 DIAGNOSIS — N951 Menopausal and female climacteric states: Secondary | ICD-10-CM

## 2013-04-16 DIAGNOSIS — E785 Hyperlipidemia, unspecified: Secondary | ICD-10-CM

## 2013-04-16 DIAGNOSIS — I10 Essential (primary) hypertension: Secondary | ICD-10-CM

## 2013-04-16 DIAGNOSIS — Z780 Asymptomatic menopausal state: Secondary | ICD-10-CM

## 2013-04-16 LAB — COMPREHENSIVE METABOLIC PANEL
ALT: 30 U/L (ref 0–35)
AST: 31 U/L (ref 0–37)
Albumin: 4 g/dL (ref 3.5–5.2)
Alkaline Phosphatase: 72 U/L (ref 39–117)
BILIRUBIN TOTAL: 0.7 mg/dL (ref 0.3–1.2)
BUN: 13 mg/dL (ref 6–23)
CALCIUM: 8.9 mg/dL (ref 8.4–10.5)
CHLORIDE: 103 meq/L (ref 96–112)
CO2: 26 meq/L (ref 19–32)
Creatinine, Ser: 0.7 mg/dL (ref 0.4–1.2)
GFR: 90.26 mL/min (ref >60.00)
GLUCOSE: 99 mg/dL (ref 70–99)
Potassium: 4.5 mEq/L (ref 3.5–5.1)
SODIUM: 136 meq/L (ref 135–145)
TOTAL PROTEIN: 7 g/dL (ref 6.0–8.3)

## 2013-04-16 LAB — LIPID PANEL
CHOLESTEROL: 171 mg/dL (ref 0–200)
HDL: 58.3 mg/dL (ref >39.00)
LDL Cholesterol: 76 mg/dL (ref 0–99)
Total CHOL/HDL Ratio: 3
Triglycerides: 185 mg/dL — ABNORMAL HIGH (ref 0.0–149.0)
VLDL: 37 mg/dL (ref 0.0–40.0)

## 2013-04-16 LAB — TSH: TSH: 0.47 u[IU]/mL (ref 0.35–5.50)

## 2013-04-16 MED ORDER — LEVOTHYROXINE SODIUM 125 MCG PO TABS: ORAL_TABLET | ORAL | Status: DC

## 2013-04-16 MED ORDER — ESTRADIOL 1 MG PO TABS: 1.0000 mg | ORAL_TABLET | Freq: Every day | ORAL | Status: DC

## 2013-04-16 MED ORDER — SIMVASTATIN 40 MG PO TABS: ORAL_TABLET | ORAL | Status: DC

## 2013-04-16 NOTE — Assessment & Plan Note (Signed)
Due for labs. Check labs today. Continue current dose of synthroid- may need to adjust this dose once we have labs results. Orders Placed This Encounter  Procedures  . Comprehensive metabolic panel  . Lipid panel  . TSH

## 2013-04-16 NOTE — Assessment & Plan Note (Signed)
Stable on current rx. No changes. 

## 2013-04-16 NOTE — Patient Instructions (Signed)
Good to see you. We will call you with your lab results.   

## 2013-04-16 NOTE — Progress Notes (Signed)
Pre visit review using our clinic review tool, if applicable. No additional management support is needed unless otherwise documented below in the visit note. 

## 2013-04-16 NOTE — Assessment & Plan Note (Signed)
Check lipid panel today. Zocor eRx sent.

## 2013-04-16 NOTE — Assessment & Plan Note (Signed)
Weaning off of Estrace and states that hot flashes are "tolerable."

## 2013-04-16 NOTE — Telephone Encounter (Signed)
Relevant patient education assigned to patient using Emmi. ° °

## 2013-04-16 NOTE — Progress Notes (Signed)
Subjective:    Patient ID: [name], female    DOB: Nov 15, 1940, 73 y.o.   MRN: [id number]  HPI  73 yo very pleasant female with h/o HLD, HTN, hypothyroidism, venous insufficiency here for med refills.   HLD-  on zocor 40 mg daily.  Due for labs.  Lab Results  Component Value Date   CHOL 169 01/17/2012   HDL 50.90 01/17/2012   LDLCALC 55 08/25/2010   LDLDIRECT 85.2 01/17/2012   TRIG 202.0* 01/17/2012   CHOLHDL 3 01/17/2012    Hypothyroidism- on synthroid 125 mcg daily.  Denies any symptoms of hypo or hyperthyroidism.  Lab Results  Component Value Date   TSH 1.91 01/17/2012    HTN- stable on current dose of Atenolol.   She is weaning herself off of Estrace.  Taking one tablet every few days. Patient Active Problem List   Diagnosis Date Noted  . HLD (hyperlipidemia) 04/16/2013  . HTN (hypertension) 05/08/2012  . Post menopausal syndrome 01/27/2012  . HYPOTHYROIDISM 09/08/2007  . UNSPECIFIED VENOUS INSUFFICIENCY 09/08/2007   Past Medical History  Diagnosis Date  . Venous stasis ulcer 11/1997    [facility] care center, [place]  . Hypothyroidism   . Hypercholesteremia   . Back pain   . Migraine    Past Surgical History  Procedure Laterality Date  . Abdominal hysterectomy      1 1/2 ovaries gone, second to endometriosis  . Appendectomy    . Thyroidectomy    . Hernia repair    . Rotator cuff repair    . Cystoscopy     History  Substance Use Topics  . Smoking status: Never Smoker   . Smokeless tobacco: Not on file  . Alcohol Use: No   Family History  Problem Relation Age of Onset  . Stroke Mother   . Stomach cancer Sister   . Heart attack Father   . Heart attack Brother   . Colon cancer Neg Hx   . Esophageal cancer Neg Hx    Allergies  Allergen Reactions  . Codeine     REACTION: NAUSEA AND VOMITING  . Tramadol     Vomiting, LOC   Current Outpatient Prescriptions on File Prior to Visit  Medication Sig Dispense Refill  . aspirin EC 81 MG  tablet Take 81 mg by mouth daily.      [name] atenolol (TENORMIN) 50 MG tablet TAKE 1 TABLET BY MOUTH EVERY DAY  90 tablet  3  . Cholecalciferol (VITAMIN D-3) 1000 UNITS CAPS Take 1 capsule by mouth daily.      . Multiple Vitamin (MULTIVITAMIN) tablet Take 1 tablet by mouth daily.        [name] omega-3 acid ethyl esters (LOVAZA) 1 G capsule Take 1 g by mouth daily.      . SUMAtriptan (IMITREX) 20 MG/ACT nasal spray Place 1 spray into the nose as needed.         No current facility-administered medications on file prior to visit.   The PMH, PSH, Social History, Family History, Medications, and allergies have been reviewed in [facility], and have been updated if relevant.    Review of Systems See HPI  Denies myalgias No HA, blurred vision or SOB    Objective:   Physical Exam BP 126/64  Pulse 67  Temp(Src) 97.3 F (36.3 C) (Oral)  Wt 189 lb 8 oz (85.957 kg)  SpO2 98%   General:  Well-developed,well-nourished,in no acute distress; alert,appropriate and cooperative throughout examination Head:  normocephalic and  atraumatic.   Nose:  no external deformity.   Mouth:  good dentition.   Neck:  No deformities, masses, or tenderness noted. Lungs:  Normal respiratory effort, chest expands symmetrically. Lungs are clear to auscultation, no crackles or wheezes. Heart:  Normal rate and regular rhythm. S1 and S2 normal without gallop, murmur, click, rub or other extra sounds. Abdomen:  Bowel sounds positive,abdomen soft and non-tender without masses, organomegaly or hernias noted. Msk:  No deformity or scoliosis noted of thoracic or lumbar spine.   Extremities:  No clubbing, cyanosis, edema, or deformity noted with normal full range of motion of all joints.   Neurologic:  alert & oriented X3 and gait normal.   Skin:  Intact without suspicious lesions or rashes Psych:  Cognition and judgment appear intact. Alert and cooperative with normal attention span and concentration. No apparent delusions, illusions,  hallucinations       Assessment & Plan:

## 2013-04-27 ENCOUNTER — Ambulatory Visit (INDEPENDENT_AMBULATORY_CARE_PROVIDER_SITE_OTHER): Payer: Medicare Other | Admitting: Internal Medicine

## 2013-04-27 VITALS — BP 122/68 | HR 68 | Wt 188.0 lb

## 2013-04-27 DIAGNOSIS — L0291 Cutaneous abscess, unspecified: Secondary | ICD-10-CM

## 2013-04-27 DIAGNOSIS — T148 Other injury of unspecified body region: Secondary | ICD-10-CM

## 2013-04-27 DIAGNOSIS — W57XXXA Bitten or stung by nonvenomous insect and other nonvenomous arthropods, initial encounter: Secondary | ICD-10-CM

## 2013-04-27 DIAGNOSIS — L0390 Cellulitis, unspecified: Secondary | ICD-10-CM

## 2013-04-27 MED ORDER — DOXYCYCLINE HYCLATE 50 MG PO CAPS: 50.0000 mg | ORAL_CAPSULE | Freq: Two times a day (BID) | ORAL | Status: DC

## 2013-04-27 NOTE — Patient Instructions (Addendum)
Tick Bite Information Ticks are insects that attach themselves to the skin and draw blood for food. There are various types of ticks. Common types include wood ticks and deer ticks. Most ticks live in shrubs and grassy areas. Ticks can climb onto your body when you make contact with leaves or grass where the tick is waiting. The most common places on the body for ticks to attach themselves are the scalp, neck, armpits, waist, and groin. Most tick bites are harmless, but sometimes ticks carry germs that cause diseases. These germs can be spread to a person during the tick's feeding process. The chance of a disease spreading through a tick bite depends on:   The type of tick.  Time of year.   How long the tick is attached.   Geographic location.  HOW CAN YOU PREVENT TICK BITES? Take these steps to help prevent tick bites when you are outdoors:  Wear protective clothing. Long sleeves and long pants are best.   Wear white clothes so you can see ticks more easily.  Tuck your pant legs into your socks.   If walking on a trail, stay in the middle of the trail to avoid brushing against bushes.  Avoid walking through areas with long grass.  Put insect repellent on all exposed skin and along boot tops, pant legs, and sleeve cuffs.   Check clothing, hair, and skin repeatedly and before going inside.   Brush off any ticks that are not attached.  Take a shower or bath as soon as possible after being outdoors.  WHAT IS THE PROPER WAY TO REMOVE A TICK? Ticks should be removed as soon as possible to help prevent diseases caused by tick bites. 1. If latex gloves are available, put them on before trying to remove a tick.  2. Using fine-point tweezers, grasp the tick as close to the skin as possible. You may also use curved forceps or a tick removal tool. Grasp the tick as close to its head as possible. Avoid grasping the tick on its body. 3. Pull gently with steady upward pressure until  the tick lets go. Do not twist the tick or jerk it suddenly. This may break off the tick's head or mouth parts. 4. Do not squeeze or crush the tick's body. This could force disease-carrying fluids from the tick into your body.  5. After the tick is removed, wash the bite area and your hands with soap and water or other disinfectant such as alcohol. 6. Apply a small amount of antiseptic cream or ointment to the bite site.  7. Wash and disinfect any instruments that were used.  Do not try to remove a tick by applying a hot match, petroleum jelly, or fingernail polish to the tick. These methods do not work and may increase the chances of disease being spread from the tick bite.  WHEN SHOULD YOU SEEK MEDICAL CARE? Contact your health care provider if you are unable to remove a tick from your skin or if a part of the tick breaks off and is stuck in the skin.  After a tick bite, you need to be aware of signs and symptoms that could be related to diseases spread by ticks. Contact your health care provider if you develop any of the following in the days or weeks after the tick bite:  Unexplained fever.  Rash. A circular rash that appears days or weeks after the tick bite may indicate the possibility of Lyme disease. The rash may resemble   a target with a bull's-eye and may occur at a different part of your body than the tick bite.  Redness and swelling in the area of the tick bite.   Tender, swollen lymph glands.   Diarrhea.   Weight loss.   Cough.   Fatigue.   Muscle, joint, or bone pain.   Abdominal pain.   Headache.   Lethargy or a change in your level of consciousness.  Difficulty walking or moving your legs.   Numbness in the legs.   Paralysis.  Shortness of breath.   Confusion.   Repeated vomiting.  Document Released: 01/02/2000 Document Revised: 10/25/2012 Document Reviewed: 06/14/2012 Exit[employer] Patient Information 2014 [employer].  

## 2013-04-28 ENCOUNTER — Encounter: Payer: Self-pay | Admitting: Internal Medicine

## 2013-04-28 NOTE — Progress Notes (Signed)
Subjective:    Patient ID: [name], female    DOB: May 30, 1940, 73 y.o.   MRN: [id number]  HPI  Pt presents to the clinic today with c/o a tick bite. This occurred yesterday. She feels like she was able to get the entire tick out but has noticed some redness and tenderness around the bite. It has not drained. She has denies fever, chills or body aches.  Review of Systems      Past Medical History  Diagnosis Date  . Venous stasis ulcer 11/1997    Wound care center, [place]  . Hypothyroidism   . Hypercholesteremia   . Back pain   . Migraine     Current Outpatient Prescriptions  Medication Sig Dispense Refill  . aspirin EC 81 MG tablet Take 81 mg by mouth daily.      [name] atenolol (TENORMIN) 50 MG tablet TAKE 1 TABLET BY MOUTH EVERY DAY  90 tablet  3  . Cholecalciferol (VITAMIN D-3) 1000 UNITS CAPS Take 1 capsule by mouth daily.      [name] estradiol (ESTRACE) 1 MG tablet Take 1 tablet (1 mg total) by mouth daily.  90 tablet  0  . levothyroxine (SYNTHROID, LEVOTHROID) 125 MCG tablet TAKE 1 TABLET BY MOUTH EVERY DAY  90 tablet  1  . Multiple Vitamin (MULTIVITAMIN) tablet Take 1 tablet by mouth daily.        . simvastatin (ZOCOR) 40 MG tablet TAKE 1 TABLET BY MOUTH AT BEDTIME  90 tablet  1  . SUMAtriptan (IMITREX) 20 MG/ACT nasal spray Place 1 spray into the nose as needed.        . doxycycline (VIBRAMYCIN) 50 MG capsule Take 1 capsule (50 mg total) by mouth 2 (two) times daily.  20 capsule  0  . omega-3 acid ethyl esters (LOVAZA) 1 G capsule Take 1 g by mouth daily.       No current facility-administered medications for this visit.    Allergies  Allergen Reactions  . Codeine     REACTION: NAUSEA AND VOMITING  . Tramadol     Vomiting, LOC    Family History  Problem Relation Age of Onset  . Stroke Mother   . Stomach cancer Sister   . Heart attack Father   . Heart attack Brother   . Colon cancer Neg Hx   . Esophageal cancer Neg Hx     History   Social History    . Marital Status: Married    Spouse Name: N/A    Number of Children: 2  . Years of Education: N/A   Occupational History  . Retired    Social History Main Topics  . Smoking status: Never Smoker   . Smokeless tobacco: Not on file  . Alcohol Use: No  . Drug Use: No  . Sexual Activity: Not on file   Other Topics Concern  . Not on file   Social History Narrative   Lives with husband     Constitutional: Denies fever, malaise, fatigue, headache or abrupt weight changes.  Skin: Pt reports redness. Denies  rashes, lesions or ulcercations.    No other specific complaints in a complete review of systems (except as listed in HPI above).  Objective:   Physical Exam    BP 122/68  Pulse 68  Wt 188 lb (85.276 kg) Wt Readings from Last 3 Encounters:  04/27/13 188 lb (85.276 kg)  04/16/13 189 lb 8 oz (85.957 kg)  03/20/13 189 lb 8  oz (85.957 kg)    General: Appears her stated age, well developed, well nourished in NAD. Skin: Warm, dry and intact. Small lesion noted on left anterior lower leg, surrounded by nickel size area of cellulitis. Cardiovascular: Normal rate and rhythm. S1,S2 noted.  No murmur, rubs or gallops noted. No JVD or BLE edema. No carotid bruits noted. Pulmonary/Chest: Normal effort and positive vesicular breath sounds. No respiratory distress. No wheezes, rales or ronchi noted.   BMET    Component Value Date/Time   NA 136 04/16/2013 0839   K 4.5 04/16/2013 0839   CL 103 04/16/2013 0839   CO2 26 04/16/2013 0839   GLUCOSE 99 04/16/2013 0839   BUN 13 04/16/2013 0839   CREATININE 0.7 04/16/2013 0839   CALCIUM 8.9 04/16/2013 0839   GFRNONAA 85* 01/18/2013 1000   GFRAA >90 01/18/2013 1000    Lipid Panel     Component Value Date/Time   CHOL 171 04/16/2013 0839   TRIG 185.0* 04/16/2013 0839   HDL 58.30 04/16/2013 0839   CHOLHDL 3 04/16/2013 0839   VLDL 37.0 04/16/2013 0839   LDLCALC 76 04/16/2013 0839    CBC    Component Value Date/Time   WBC 5.8 01/18/2013 1000    RBC 4.28 01/18/2013 1000   HGB 13.9 01/18/2013 1226   HCT 41.0 01/18/2013 1226   PLT 222 01/18/2013 1000   MCV 94.9 01/18/2013 1000   MCH 32.7 01/18/2013 1000   MCHC 34.5 01/18/2013 1000   RDW 13.2 01/18/2013 1000   LYMPHSABS 0.8 01/18/2013 1000   MONOABS 0.5 01/18/2013 1000   EOSABS 0.1 01/18/2013 1000   BASOSABS 0.0 01/18/2013 1000    Hgb A1C No results found for this basename: HGBA1C       Assessment & Plan:   Tick bite of left lower leg with cellulitis:  Will treat with doxycycline 100 mg BID x 10 days Keep leg elevated to reduce swelling  RTC as needed or if symptoms persist or worsen

## 2013-05-01 ENCOUNTER — Telehealth: Payer: Self-pay | Admitting: Family Medicine

## 2013-05-01 NOTE — Telephone Encounter (Signed)
Patient called and asked for [facility] to call her back.  She said it's personal.

## 2013-05-04 NOTE — Telephone Encounter (Signed)
Saw pt on 05/03/13 at her husbands ov visit. She was provided a copy of her labs; originals released through [employer]

## 2013-05-08 ENCOUNTER — Ambulatory Visit: Payer: Medicare Other | Admitting: Family Medicine

## 2013-06-13 ENCOUNTER — Encounter: Payer: Self-pay | Admitting: Internal Medicine

## 2013-06-13 ENCOUNTER — Ambulatory Visit (INDEPENDENT_AMBULATORY_CARE_PROVIDER_SITE_OTHER): Payer: Medicare Other | Admitting: Internal Medicine

## 2013-06-13 VITALS — BP 126/64 | HR 65 | Temp 97.8°F | Wt 188.0 lb

## 2013-06-13 DIAGNOSIS — W57XXXA Bitten or stung by nonvenomous insect and other nonvenomous arthropods, initial encounter: Secondary | ICD-10-CM

## 2013-06-13 DIAGNOSIS — T148 Other injury of unspecified body region: Secondary | ICD-10-CM

## 2013-06-13 DIAGNOSIS — M545 Low back pain, unspecified: Secondary | ICD-10-CM

## 2013-06-13 MED ORDER — MUPIROCIN 2 % EX OINT: 1.0000 "application " | TOPICAL_OINTMENT | Freq: Two times a day (BID) | CUTANEOUS | Status: DC

## 2013-06-13 NOTE — Patient Instructions (Addendum)
Insect Bite  Mosquitoes, flies, fleas, bedbugs, and many other insects can bite. Insect bites are different from insect stings. A sting is when venom is injected into the skin. Some insect bites can transmit infectious diseases.  SYMPTOMS   Insect bites usually turn red, swell, and itch for 2 to 4 days. They often go away on their own.  TREATMENT   Your caregiver may prescribe antibiotic medicines if a bacterial infection develops in the bite.  HOME CARE INSTRUCTIONS   Do not scratch the bite area.   Keep the bite area clean and dry. Wash the bite area thoroughly with soap and water.   Put ice or cool compresses on the bite area.   Put ice in a plastic bag.   Place a towel between your skin and the bag.   Leave the ice on for 20 minutes, 4 times a day for the first 2 to 3 days, or as directed.   You may apply a baking soda paste, cortisone cream, or calamine lotion to the bite area as directed by your caregiver. This can help reduce itching and swelling.   Only take over-the-counter or prescription medicines as directed by your caregiver.   If you are given antibiotics, take them as directed. Finish them even if you start to feel better.  You may need a tetanus shot if:   You cannot remember when you had your last tetanus shot.   You have never had a tetanus shot.   The injury broke your skin.  If you get a tetanus shot, your arm may swell, get red, and feel warm to the touch. This is common and not a problem. If you need a tetanus shot and you choose not to have one, there is a rare chance of getting tetanus. Sickness from tetanus can be serious.  SEEK IMMEDIATE MEDICAL CARE IF:    You have increased pain, redness, or swelling in the bite area.   You see a red line on the skin coming from the bite.   You have a fever.   You have joint pain.   You have a headache or neck pain.   You have unusual weakness.   You have a rash.   You have chest pain or shortness of breath.    You have abdominal pain, nausea, or vomiting.   You feel unusually tired or sleepy.  MAKE SURE YOU:    Understand these instructions.   Will watch your condition.   Will get help right away if you are not doing well or get worse.  Document Released: 02/12/2004 Document Revised: 03/29/2011 Document Reviewed: 08/05/2010  [employer] Patient Information 2014 [employer].

## 2013-06-13 NOTE — Progress Notes (Signed)
Subjective:    Patient ID: [name], female    DOB: 12-04-1940, 73 y.o.   MRN: [id number]  HPI  Pt presents to the clinic today with c/o an insect bite on the back of her right ankle. She reports this occurred 1 week ago. The area is red and itchy. She denies pain or drainage from the area. She denies fever, chills or body aches.  She would also like to be checked for a UTI. She has had low back pain the last 2-3 days. She denies all urinary symptoms. She wants to check and make sure it is not an infections while she is here.  Review of Systems      Past Medical History  Diagnosis Date  . Venous stasis ulcer 11/1997    Wound care center, [place]  . Hypothyroidism   . Hypercholesteremia   . Back pain   . Migraine     Current Outpatient Prescriptions  Medication Sig Dispense Refill  . aspirin EC 81 MG tablet Take 81 mg by mouth daily.      [name] atenolol (TENORMIN) 50 MG tablet TAKE 1 TABLET BY MOUTH EVERY DAY  90 tablet  3  . Cholecalciferol (VITAMIN D-3) 1000 UNITS CAPS Take 1 capsule by mouth daily.      [name] estradiol (ESTRACE) 1 MG tablet Take 1 tablet (1 mg total) by mouth daily.  90 tablet  0  . levothyroxine (SYNTHROID, LEVOTHROID) 125 MCG tablet TAKE 1 TABLET BY MOUTH EVERY DAY  90 tablet  1  . Multiple Vitamin (MULTIVITAMIN) tablet Take 1 tablet by mouth daily.        [name] omega-3 acid ethyl esters (LOVAZA) 1 G capsule Take 1 g by mouth daily.      . simvastatin (ZOCOR) 40 MG tablet TAKE 1 TABLET BY MOUTH AT BEDTIME  90 tablet  1  . SUMAtriptan (IMITREX) 20 MG/ACT nasal spray Place 1 spray into the nose as needed.         No current facility-administered medications for this visit.    Allergies  Allergen Reactions  . Codeine     REACTION: NAUSEA AND VOMITING  . Tramadol     Vomiting, LOC    Family History  Problem Relation Age of Onset  . Stroke Mother   . Stomach cancer Sister   . Heart attack Father   . Heart attack Brother   . Colon cancer Neg Hx     . Esophageal cancer Neg Hx     History   Social History  . Marital Status: Married    Spouse Name: N/A    Number of Children: 2  . Years of Education: N/A   Occupational History  . Retired    Social History Main Topics  . Smoking status: Never Smoker   . Smokeless tobacco: Not on file  . Alcohol Use: No  . Drug Use: No  . Sexual Activity: Not on file   Other Topics Concern  . Not on file   Social History Narrative   Lives with husband     Constitutional: Denies fever, malaise, fatigue, headache or abrupt weight changes.  Skin: Pt reports insect bite. Denies rashes, lesions or ulcercations.  GU: She denies urgency, frequency, dysuria or blood in urine. MSK: Pt reports low back pain. Denies muscle pain, issues with gait.  No other specific complaints in a complete review of systems (except as listed in HPI above).  Objective:   Physical Exam  BP 126/64  Pulse 65  Temp(Src) 97.8 F (36.6 C) (Oral)  Wt 188 lb (85.276 kg)  SpO2 98% Wt Readings from Last 3 Encounters:  06/13/13 188 lb (85.276 kg)  04/27/13 188 lb (85.276 kg)  04/16/13 189 lb 8 oz (85.957 kg)    General: Appears her stated age, well developed, well nourished in NAD. Skin: Warm, dry and intact. Small scabbed over lesion noted on right ankle, no infection noted. Cardiovascular: Normal rate and rhythm. S1,S2 noted.  No murmur, rubs or gallops noted. No JVD or BLE edema. No carotid bruits noted. Pulmonary/Chest: Normal effort and positive vesicular breath sounds. No respiratory distress. No wheezes, rales or ronchi noted.  Abdomen: soft, nontender. Active BS. No CVA tenderness.  BMET    Component Value Date/Time   NA 136 04/16/2013 0839   K 4.5 04/16/2013 0839   CL 103 04/16/2013 0839   CO2 26 04/16/2013 0839   GLUCOSE 99 04/16/2013 0839   BUN 13 04/16/2013 0839   CREATININE 0.7 04/16/2013 0839   CALCIUM 8.9 04/16/2013 0839   GFRNONAA 85* 01/18/2013 1000   GFRAA >90 01/18/2013 1000    Lipid Panel      Component Value Date/Time   CHOL 171 04/16/2013 0839   TRIG 185.0* 04/16/2013 0839   HDL 58.30 04/16/2013 0839   CHOLHDL 3 04/16/2013 0839   VLDL 37.0 04/16/2013 0839   LDLCALC 76 04/16/2013 0839    CBC    Component Value Date/Time   WBC 5.8 01/18/2013 1000   RBC 4.28 01/18/2013 1000   HGB 13.9 01/18/2013 1226   HCT 41.0 01/18/2013 1226   PLT 222 01/18/2013 1000   MCV 94.9 01/18/2013 1000   MCH 32.7 01/18/2013 1000   MCHC 34.5 01/18/2013 1000   RDW 13.2 01/18/2013 1000   LYMPHSABS 0.8 01/18/2013 1000   MONOABS 0.5 01/18/2013 1000   EOSABS 0.1 01/18/2013 1000   BASOSABS 0.0 01/18/2013 1000    Hgb A1C No results found for this basename: HGBA1C        Assessment & Plan:   Bug bite:  eRx for mupiricon oitment to affected area bid Watch for increased redness, swelling, or drainage  Low back pain:  Urinalysis: normal No need for abs at this time  RTC as needed

## 2013-06-13 NOTE — Progress Notes (Signed)
Pre visit review using our clinic review tool, if applicable. No additional management support is needed unless otherwise documented below in the visit note. 

## 2013-06-15 ENCOUNTER — Other Ambulatory Visit: Payer: Self-pay

## 2013-06-15 DIAGNOSIS — W57XXXA Bitten or stung by nonvenomous insect and other nonvenomous arthropods, initial encounter: Secondary | ICD-10-CM

## 2013-06-15 MED ORDER — MUPIROCIN 2 % EX OINT: TOPICAL_OINTMENT | Freq: Two times a day (BID) | CUTANEOUS | Status: DC

## 2013-06-17 ENCOUNTER — Encounter: Payer: Self-pay | Admitting: Family Medicine

## 2013-07-07 ENCOUNTER — Other Ambulatory Visit: Payer: Self-pay | Admitting: Family Medicine

## 2013-07-09 NOTE — Telephone Encounter (Signed)
Rx was last written for this med on 11/2011 for #[id number]. She was seen for a med f/u in 03/2013, and has had a few acute visits since then. Her cpx is scheduled for 09/03/13.

## 2013-08-20 ENCOUNTER — Other Ambulatory Visit: Payer: Self-pay | Admitting: Family Medicine

## 2013-08-20 DIAGNOSIS — E785 Hyperlipidemia, unspecified: Secondary | ICD-10-CM

## 2013-08-20 DIAGNOSIS — I10 Essential (primary) hypertension: Secondary | ICD-10-CM

## 2013-08-20 DIAGNOSIS — E039 Hypothyroidism, unspecified: Secondary | ICD-10-CM

## 2013-08-27 ENCOUNTER — Other Ambulatory Visit (INDEPENDENT_AMBULATORY_CARE_PROVIDER_SITE_OTHER): Payer: Medicare Other

## 2013-08-27 DIAGNOSIS — I10 Essential (primary) hypertension: Secondary | ICD-10-CM

## 2013-08-27 DIAGNOSIS — E039 Hypothyroidism, unspecified: Secondary | ICD-10-CM

## 2013-08-27 DIAGNOSIS — E785 Hyperlipidemia, unspecified: Secondary | ICD-10-CM

## 2013-08-27 LAB — CBC WITH DIFFERENTIAL/PLATELET
Basophils Absolute: 0 10*3/uL (ref 0.0–0.1)
Basophils Relative: 0.7 % (ref 0.0–3.0)
EOS ABS: 0.3 10*3/uL (ref 0.0–0.7)
Eosinophils Relative: 5.6 % — ABNORMAL HIGH (ref 0.0–5.0)
HCT: 36.9 % (ref 36.0–46.0)
HEMOGLOBIN: 12.4 g/dL (ref 12.0–15.0)
Lymphocytes Relative: 28.6 % (ref 12.0–46.0)
Lymphs Abs: 1.6 10*3/uL (ref 0.7–4.0)
MCHC: 33.6 g/dL (ref 30.0–36.0)
MCV: 95.5 fl (ref 78.0–100.0)
Monocytes Absolute: 0.6 10*3/uL (ref 0.1–1.0)
Monocytes Relative: 10.4 % (ref 3.0–12.0)
NEUTROS ABS: 3.1 10*3/uL (ref 1.4–7.7)
Neutrophils Relative %: 54.7 % (ref 43.0–77.0)
Platelets: 229 10*3/uL (ref 150.0–400.0)
RBC: 3.86 Mil/uL — ABNORMAL LOW (ref 3.87–5.11)
RDW: 13.3 % (ref 11.5–15.5)
WBC: 5.6 10*3/uL (ref 4.0–10.5)

## 2013-08-27 LAB — LIPID PANEL
CHOLESTEROL: 162 mg/dL (ref 0–200)
HDL: 43.1 mg/dL (ref >39.00)
LDL Cholesterol: 86 mg/dL (ref 0–99)
NonHDL: 118.9
TRIGLYCERIDES: 167 mg/dL — AB (ref 0.0–149.0)
Total CHOL/HDL Ratio: 4
VLDL: 33.4 mg/dL (ref 0.0–40.0)

## 2013-08-27 LAB — COMPREHENSIVE METABOLIC PANEL
ALT: 38 U/L — ABNORMAL HIGH (ref 0–35)
AST: 35 U/L (ref 0–37)
Albumin: 3.7 g/dL (ref 3.5–5.2)
Alkaline Phosphatase: 81 U/L (ref 39–117)
BUN: 12 mg/dL (ref 6–23)
CALCIUM: 8.9 mg/dL (ref 8.4–10.5)
CHLORIDE: 104 meq/L (ref 96–112)
CO2: 24 mEq/L (ref 19–32)
CREATININE: 0.7 mg/dL (ref 0.4–1.2)
GFR: 87.21 mL/min (ref >60.00)
GLUCOSE: 97 mg/dL (ref 70–99)
Potassium: 4.4 mEq/L (ref 3.5–5.1)
Sodium: 137 mEq/L (ref 135–145)
Total Bilirubin: 0.7 mg/dL (ref 0.2–1.2)
Total Protein: 6.9 g/dL (ref 6.0–8.3)

## 2013-08-27 LAB — T4, FREE: Free T4: 1 ng/dL (ref 0.60–1.60)

## 2013-08-27 LAB — TSH: TSH: 0.26 u[IU]/mL — ABNORMAL LOW (ref 0.35–4.50)

## 2013-09-03 ENCOUNTER — Ambulatory Visit (INDEPENDENT_AMBULATORY_CARE_PROVIDER_SITE_OTHER): Payer: Medicare Other | Admitting: Family Medicine

## 2013-09-03 ENCOUNTER — Encounter: Payer: Self-pay | Admitting: Family Medicine

## 2013-09-03 VITALS — BP 122/72 | HR 88 | Temp 97.6°F | Ht 68.0 in | Wt 191.2 lb

## 2013-09-03 DIAGNOSIS — I10 Essential (primary) hypertension: Secondary | ICD-10-CM

## 2013-09-03 DIAGNOSIS — E785 Hyperlipidemia, unspecified: Secondary | ICD-10-CM

## 2013-09-03 DIAGNOSIS — Z780 Asymptomatic menopausal state: Secondary | ICD-10-CM

## 2013-09-03 DIAGNOSIS — I872 Venous insufficiency (chronic) (peripheral): Secondary | ICD-10-CM

## 2013-09-03 DIAGNOSIS — Z23 Encounter for immunization: Secondary | ICD-10-CM

## 2013-09-03 DIAGNOSIS — Z0000 Encounter for general adult medical examination without abnormal findings: Secondary | ICD-10-CM | POA: Insufficient documentation

## 2013-09-03 DIAGNOSIS — E039 Hypothyroidism, unspecified: Secondary | ICD-10-CM

## 2013-09-03 DIAGNOSIS — N951 Menopausal and female climacteric states: Secondary | ICD-10-CM

## 2013-09-03 MED ORDER — IBUPROFEN 800 MG PO TABS: ORAL_TABLET | ORAL | Status: DC

## 2013-09-03 NOTE — Addendum Note (Signed)
Addended by: Modena Nunnery on: 09/03/2013 09:12 AM   Modules accepted: Orders

## 2013-09-03 NOTE — Assessment & Plan Note (Signed)
Reviewed preventive care protocols, scheduled due services, and updated immunizations Discussed nutrition, exercise, diet, and healthy lifestyle.  

## 2013-09-03 NOTE — Assessment & Plan Note (Signed)
Stable on current rx. No changes. 

## 2013-09-03 NOTE — Progress Notes (Signed)
Pre visit review using our clinic review tool, if applicable. No additional management support is needed unless otherwise documented below in the visit note. 

## 2013-09-03 NOTE — Patient Instructions (Signed)
Good to see you. Please come back in 1 month to repeat your thyroid labs.

## 2013-09-03 NOTE — Progress Notes (Addendum)
Subjective:    Patient ID: [name], female    DOB: 24-Mar-1940, 73 y.o.   MRN: [id number]  HPI  73 yo very pleasant female with h/o HLD, HTN, hypothyroidism, venous insufficiency here medicare wellness visit.  I have personally reviewed the Medicare Annual Wellness questionnaire and have noted 1. The patient's medical and social history 2. Their use of alcohol, tobacco or illicit drugs 3. Their current medications and supplements 4. The patient's functional ability including ADL's, fall risks, home safety risks and hearing or visual             impairment. 5. Diet and physical activities 6. Evidence for depression or mood disorders  End of life wishes discussed and updated in Social History.  The roster of all physicians providing medical care to patient - is listed in the Snapshot section of the chart.  Pneumovax 08/15/06 Tdap 01/18/13 Colonoscopy [facility])- 10/20/10- 10 year recall Mammogram 3/1/0/15 Zostavax at [facility] last year per pt Eye exam 07/30/2013.  Saw Dr. Ronnald Ramp (derm) last week- BCC removed from her back.  Still working at [employer] job.  HLD-  on zocor 40 mg daily.   Lab Results  Component Value Date   CHOL 162 08/27/2013   HDL 43.10 08/27/2013   LDLCALC 86 08/27/2013   LDLDIRECT 85.2 01/17/2012   TRIG 167.0* 08/27/2013   CHOLHDL 4 08/27/2013    Hypothyroidism- on synthroid 125 mcg daily.  TSH low but FT4 normal.  Denies any symptoms of hyperthyroidism. No diarrhea, no palpitations, no CP, no tremors, no SOB.  Lab Results  Component Value Date   TSH 0.26* 08/27/2013    HTN- stable on current dose of Atenolol.   She is weaning herself off of Estrace.  Taking one tablet every few days. Patient Active Problem List   Diagnosis Date Noted  . Medicare annual wellness visit, subsequent 09/03/2013  . Routine general medical examination at a health care facility 09/03/2013  . HLD (hyperlipidemia) 04/16/2013  . HTN (hypertension) 05/08/2012  . 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 HYPOTHYROIDISM 09/08/2007  . UNSPECIFIED VENOUS INSUFFICIENCY 09/08/2007   Past Medical History  Diagnosis Date  . Venous stasis ulcer 11/1997    Wound care center, [place]  . Hypothyroidism   . Hypercholesteremia   . Back pain   . Migraine    Past Surgical History  Procedure Laterality Date  . Abdominal hysterectomy      1 1/2 ovaries gone, second to endometriosis  . Appendectomy    . Thyroidectomy    . Hernia repair    . Rotator cuff repair    . Cystoscopy     History  Substance Use Topics  . Smoking status: Never Smoker   . Smokeless tobacco: Not on file  . Alcohol Use: No   Family History  Problem Relation Age of Onset  . Stroke Mother   . Stomach cancer Sister   . Heart attack Father   . Heart attack Brother   . Colon cancer Neg Hx   . Esophageal cancer Neg Hx    Allergies  Allergen Reactions  . Codeine     REACTION: NAUSEA AND VOMITING  . Tramadol     Vomiting, LOC   Current Outpatient Prescriptions on File Prior to Visit  Medication Sig Dispense Refill  . aspirin EC 81 MG tablet Take 81 mg by mouth daily.      [name] atenolol (TENORMIN) 50 MG tablet TAKE 1 TABLET BY MOUTH EVERY DAY  90 tablet  3  . Cholecalciferol (VITAMIN D-3) 1000 UNITS CAPS Take 1 capsule by mouth daily.      [name] estradiol (ESTRACE) 1 MG tablet Take 1 tablet (1 mg total) by mouth daily.  90 tablet  0  . levothyroxine (SYNTHROID, LEVOTHROID) 125 MCG tablet TAKE 1 TABLET BY MOUTH EVERY DAY  90 tablet  1  . Multiple Vitamin (MULTIVITAMIN) tablet Take 1 tablet by mouth daily.        . mupirocin ointment (BACTROBAN) 2 % Apply topically 2 (two) times daily.  22 g  0  . omega-3 acid ethyl esters (LOVAZA) 1 G capsule Take 1 g by mouth daily.      . simvastatin (ZOCOR) 40 MG tablet TAKE 1 TABLET BY MOUTH AT BEDTIME  90 tablet  1  . SUMAtriptan (IMITREX) 20 MG/ACT nasal spray Place 1 spray into the nose as needed.         No current facility-administered  medications on file prior to visit.   The PMH, PSH, Social History, Family History, Medications, and allergies have been reviewed in [facility], and have been updated if relevant.    Review of Systems See HPI  Denies myalgias No HA, blurred vision or SOB +fatigue but feels it from "working too hard." No LE edema +intermittent back pain- unchanged Denies feeling depressed or anxious No blood in stool or changes in bowel habits No dysuria No abnormal skin lesions No breast pain or nipple discharge No pelvic pain No leg or arm weakness No tremors    Objective:   Physical Exam BP 122/72  Pulse 88  Temp(Src) 97.6 F (36.4 C) (Oral)  Ht 5\' 8"  (1.727 m)  Wt 191 lb 4 oz (86.75 kg)  BMI 29.09 kg/m2  SpO2 96%    General:  Well-developed,well-nourished,in no acute distress; alert,appropriate and cooperative throughout examination Head:  normocephalic and atraumatic.   Eyes:  vision grossly intact, pupils equal, pupils round, and pupils reactive to light.   Ears:  R ear normal and L ear normal.   Nose:  no external deformity.   Mouth:  good dentition.   Neck:  No deformities, masses, or tenderness noted. Breasts:  No mass, nodules, thickening, tenderness, bulging, retraction, inflamation, nipple discharge or skin changes noted.   Lungs:  Normal respiratory effort, chest expands symmetrically. Lungs are clear to auscultation, no crackles or wheezes. Heart:  Normal rate and regular rhythm. S1 and S2 normal without gallop, murmur, click, rub or other extra sounds. Abdomen:  Bowel sounds positive,abdomen soft and non-tender without masses, organomegaly or hernias noted. Msk:  No deformity or scoliosis noted of thoracic or lumbar spine.   Extremities:  No clubbing, cyanosis, edema, or deformity noted with normal full range of motion of all joints.   Neurologic:  alert & oriented X3 and gait normal.   Skin:  Intact without rashes Well healing circular scar on upper mid back without signs of  infection Cervical Nodes:  No lymphadenopathy noted Axillary Nodes:  No palpable lymphadenopathy Psych:  Cognition and judgment appear intact. Alert and cooperative with normal attention span and concentration. No apparent delusions, illusions, hallucinations     Assessment & Plan:

## 2013-09-03 NOTE — Assessment & Plan Note (Signed)
Well controlled on current dose of zocor and lovaza.

## 2013-09-03 NOTE — Assessment & Plan Note (Signed)
TSH low but asymptomatic with normal FT4. Will recheck thyroid function in 1 month.

## 2013-09-03 NOTE — Assessment & Plan Note (Signed)
The patients weight, height, BMI and visual acuity have been recorded in the chart I have made referrals, counseling and provided education to the patient based review of the above and I have provided the pt with a written personalized care plan for preventive services.  Prevnar 13 today.

## 2013-09-04 ENCOUNTER — Encounter: Payer: Self-pay | Admitting: Family Medicine

## 2013-09-23 ENCOUNTER — Encounter: Payer: Self-pay | Admitting: Family Medicine

## 2013-09-25 ENCOUNTER — Other Ambulatory Visit: Payer: Self-pay | Admitting: Family Medicine

## 2013-09-25 MED ORDER — BUTALBITAL-ASPIRIN-CAFFEINE 50-325-40 MG PO CAPS: 1.0000 | ORAL_CAPSULE | Freq: Two times a day (BID) | ORAL | Status: DC | PRN

## 2013-09-25 NOTE — Telephone Encounter (Signed)
RX phoned in per MyChart message instructions from Dr. Deborra Medina.

## 2013-10-04 ENCOUNTER — Other Ambulatory Visit: Payer: Medicare Other

## 2013-10-08 ENCOUNTER — Other Ambulatory Visit (INDEPENDENT_AMBULATORY_CARE_PROVIDER_SITE_OTHER): Payer: Medicare Other

## 2013-10-08 DIAGNOSIS — E039 Hypothyroidism, unspecified: Secondary | ICD-10-CM

## 2013-10-09 LAB — T3, FREE: T3 FREE: 3 pg/mL (ref 2.3–4.2)

## 2013-10-09 LAB — TSH: TSH: 0.27 u[IU]/mL — AB (ref 0.35–4.50)

## 2013-10-09 LAB — T4, FREE: Free T4: 0.92 ng/dL (ref 0.60–1.60)

## 2013-10-11 ENCOUNTER — Other Ambulatory Visit: Payer: Self-pay | Admitting: Family Medicine

## 2013-10-11 DIAGNOSIS — R7989 Other specified abnormal findings of blood chemistry: Secondary | ICD-10-CM

## 2013-10-23 ENCOUNTER — Ambulatory Visit (INDEPENDENT_AMBULATORY_CARE_PROVIDER_SITE_OTHER): Payer: Medicare Other | Admitting: Endocrinology

## 2013-10-23 ENCOUNTER — Encounter: Payer: Self-pay | Admitting: Endocrinology

## 2013-10-23 VITALS — BP 118/72 | HR 65 | Temp 97.5°F | Resp 16 | Ht 68.0 in | Wt 193.8 lb

## 2013-10-23 DIAGNOSIS — E890 Postprocedural hypothyroidism: Secondary | ICD-10-CM

## 2013-10-23 MED ORDER — LEVOTHYROXINE SODIUM 125 MCG PO TABS: ORAL_TABLET | ORAL | Status: DC

## 2013-10-23 NOTE — Progress Notes (Signed)
Reason for visit: Hypothyroidism  HPI  [name] is a 73 y.o.-year-old female, referred by her PCP, Arnette Norris, MD,  for evaluation for hypothyroidism.   Patient recalls being diagnosed with hypothyroidism  About 1995 following total thyroidectomy done for goiter and benign nodule. Most recently, she has been on Levothyroxine 125 mcg daily. Reports that her dose hasn't changed in the past year. Patient has been taking this medication after her breakfast (within 10 minutes of it), with milk, and together with her pills ( centrum silver).  Missed about 2-3 doses in the past couple of months.  Denies any recent use of iodine supplements or herbal medications. Denies major change in her medical history. Most recent hospitalization was in January 2015 after a fall, where she needed sutures on her head. She has been on long term Estrace therapy, and has slowly weaned herself off over the past few months. Has been off this since past 5 weeks.  Denies personal history of thyroid cancer or XRT. FH of goiter in mother. Denies FH of thyroid cancer. Denies worsening headaches, or changes to peripheral vision. Has history of migraines, which are better recently.   I reviewed pt's thyroid tests: TSH has been slowly downtrending this year. Free thyroid hormone levels normal range. Lab Results  Component Value Date   TSH 0.27* 10/08/2013   TSH 0.26* 08/27/2013   TSH 0.47 04/16/2013   TSH 1.91 01/17/2012   TSH 2.23 08/25/2010   TSH 0.75 03/12/2004   TSH 0.174 07/20/2001   FREET4 0.92 10/08/2013   FREET4 1.00 08/27/2013   FREET4 0.76 01/17/2012    Lab Results  Component Value Date   T3FREE 3.0 10/08/2013     Review of systems: [ x  ] complains of    [  ] denies [  ] weight gain  [ x ] constipation  [  ] fatigue   [  ] dry skin  [  ] cold intolerance [  ] hair loss [  ] noticing any enlargement in size of thyroid [  ] lumps in neck [  ] dysphagia [  ] change in voice [  ]  SOB with lying down.  I have  reviewed the patient's past medical history, family and social history, surgical history, medications and allergies.  Past Medical History  Diagnosis Date  . Venous stasis ulcer 11/1997    Wound care center, [place]  . Hypothyroidism   . Hypercholesteremia   . Back pain   . Migraine    Past Surgical History  Procedure Laterality Date  . Abdominal hysterectomy      1 1/2 ovaries gone, second to endometriosis  . Appendectomy    . Thyroidectomy    . Hernia repair    . Rotator cuff repair    . Cystoscopy     History   Social History  . Marital Status: Married    Spouse Name: N/A    Number of Children: 2  . Years of Education: N/A   Occupational History  . Retired    Social History Main Topics  . Smoking status: Never Smoker   . Smokeless tobacco: Not on file  . Alcohol Use: No  . Drug Use: No  . Sexual Activity: Not on file   Other Topics Concern  . Not on file   Social History Narrative   Lives with husband   Does not have living will.   Would desire CPR but would not want to be  on life support for prolonged period of time if futile.         Current Outpatient Prescriptions on File Prior to Visit  Medication Sig Dispense Refill  . aspirin EC 81 MG tablet Take 81 mg by mouth daily.      [name] atenolol (TENORMIN) 50 MG tablet TAKE 1 TABLET BY MOUTH EVERY DAY  90 tablet  3  . butalbital-aspirin-caffeine (FIORINAL) 50-325-40 MG per capsule Take 1 capsule by mouth 2 (two) times daily as needed for headache.  60 capsule  0  . Cholecalciferol (VITAMIN D-3) 1000 UNITS CAPS Take 1 capsule by mouth daily.      [name] estradiol (ESTRACE) 1 MG tablet Take 1 tablet (1 mg total) by mouth daily.  90 tablet  0  . ibuprofen (ADVIL,MOTRIN) 800 MG tablet TAKE 1 TABLET BY MOUTH EVERY 4 TO 6 HOURS AS NEEDED  180 tablet  1  . simvastatin (ZOCOR) 40 MG tablet TAKE 1 TABLET BY MOUTH AT BEDTIME  90 tablet  1  . SUMAtriptan (IMITREX) 20 MG/ACT nasal spray Place 1 spray into the nose as needed.         . Multiple Vitamin (MULTIVITAMIN) tablet Take 1 tablet by mouth daily.        . mupirocin ointment (BACTROBAN) 2 % Apply topically 2 (two) times daily.  22 g  0  . omega-3 acid ethyl esters (LOVAZA) 1 G capsule Take 1 g by mouth daily.       No current facility-administered medications on file prior to visit.   Allergies  Allergen Reactions  . Codeine     REACTION: NAUSEA AND VOMITING  . Tramadol     Vomiting, LOC   Family History  Problem Relation Age of Onset  . Stroke Mother   . Goiter Mother   . Stomach cancer Sister   . Heart attack Father   . Heart disease Father   . Heart attack Brother   . Kidney disease Brother   . Colon cancer Neg Hx   . Esophageal cancer Neg Hx       Review of Systems: [x]  complains of  [  ] denies General:   [  ] Recent weight change [  ] Fatigue  [  ] Loss of appetite Eyes: [  ]  Vision Difficulty [  ]  Eye pain ENT: [  ]  Hearing difficulty [  ]  Difficulty Swallowing CVS: [  ] Chest pain [  ]  Palpitations/Irregular Heart beat [  ]  Shortness of breath lying flat [  ] Swelling of legs Resp: [  ] Frequent Cough [  ] Shortness of Breath  [  ]  Wheezing GI: [  ] Heartburn  [  ] Nausea or Vomiting  [  ] Diarrhea [ x ] Constipation  [  ] Abdominal Pain GU: [  ]  Polyuria  [  ]  nocturia Bones/joints:  [  ]  Muscle aches  [  ] Joint Pain  [  ] Bone pain Skin/Hair/Nails: [  ]  Rash  [  ] New stretch marks [  ]  Itching [  ] Hair loss [  ]  Excessive hair growth Reproduction: [  ] Low sexual desire , [  ]  Women: Menstrual cycle problems [  ]  Women: Breast Discharge [  ] Men: Difficulty with erections [  ]  Men: Enlarged Breasts CNS: [  ] Frequent Headaches [  ] Blurry vision [  ]  Tremors [  ] Seizures [  ] Loss of consciousness [  ] Localized weakness Endocrine: [  ]  Excess thirst [  ]  Feeling excessively hot [  ]  Feeling excessively cold Heme: [  ]  Easy bruising [  ]  Enlarged glands or lumps in neck Allergy: [  ]  Food allergies [  ]  Environmental allergies  PE: BP 118/72  Pulse 65  Temp(Src) 97.5 F (36.4 C) (Oral)  Resp 16  Ht 5\' 8"  (1.727 m)  Wt 193 lb 12 oz (87.884 kg)  BMI 29.47 kg/m2  SpO2 97% Wt Readings from Last 3 Encounters:  10/23/13 193 lb 12 oz (87.884 kg)  09/03/13 191 lb 4 oz (86.75 kg)  06/13/13 188 lb (85.276 kg)    HEENT: [place]/AT, EOMI, no icterus, no proptosis, no chemosis, no mild lid lag, no retraction, eyes close completely, gross normal VF testing on confrontation Neck: thyroid gland - not palpable, no tracheal deviation; negative Pemberton's sign; no lymphadenopathy, well healed surgical scar Lungs: good air entry, clear bilaterally Heart: S1&S2 normal, regular rate & rhythm; no murmurs, rubs or gallops Abd: soft, NT, ND, no HSM, +BS Ext: no tremor in hands bilaterally, no edema, 2+ DP/PT pulses, good muscle mass Neuro: normal gait, 2+ reflexes bilaterally, normal 5/5 strength, no proximal myopathy  Derm: no pretibial myxoedema/skin dryness   ASSESSMENT: 1. Post surgical Hypothyroidism, uncontrolled   PLAN:  Problem List Items Addressed This Visit     Endocrine   Postsurgical hypothyroidism - Primary     Recent thyroid levels suggest over-replacement with levothyroxine. She will most likely need a lower dose of LT4 now that she is off the estrace and as her age advances, her goal TSH levels could be relaxed to the mid normal range.   Patient had recent labs for her thyroid levels. Will defer a check at this time.  Appears clinically euthyroid today.  Asked her to cut back to Levothyroxine 125 mcg at 6.5 tablets weekly ( 1 tablet daily except every Sunday when she takes half tablet only). rx refilled.  Discussed correct administration of thyroid hormone- fasting, with water, separated by at least 30 minutes from breakfast, and separated by more than 4 hours from calcium, iron, multivitamins, acid reflux medications (PPIs). We discussed about medication compliance.  She will start  taking her medication as directed.   RTC 2 months. Will reassess thyroid levels at that time.    Relevant Medications      levothyroxine (SYNTHROID, LEVOTHROID) tablet       -RTC in 2 months  [facility] Girdner [facility]  10/23/2013   9:27 AM

## 2013-10-23 NOTE — Assessment & Plan Note (Signed)
Recent thyroid levels suggest over-replacement with levothyroxine. She will most likely need a lower dose of LT4 now that she is off the estrace and as her age advances, her goal TSH levels could be relaxed to the mid normal range.   Patient had recent labs for her thyroid levels. Will defer a check at this time.  Appears clinically euthyroid today.  Asked her to cut back to Levothyroxine 125 mcg at 6.5 tablets weekly ( 1 tablet daily except every Sunday when she takes half tablet only). rx refilled.  Discussed correct administration of thyroid hormone- fasting, with water, separated by at least 30 minutes from breakfast, and separated by more than 4 hours from calcium, iron, multivitamins, acid reflux medications (PPIs). We discussed about medication compliance.  She will start taking her medication as directed.   RTC 2 months. Will reassess thyroid levels at that time.

## 2013-10-23 NOTE — Progress Notes (Signed)
Pre-visit discussion using our clinic review tool. No additional management support is needed unless otherwise documented below in the visit note.  

## 2013-10-23 NOTE — Patient Instructions (Signed)
  Your thyroid hormone dose is a little high, and likely changed as you have come off the estrace.   Take levothyroxine separate from breakfast, first thing in the morning, on an empty stomach, by 30-45 minutes, and wait for about 4 hours to take your centrum silver tablet.   Donot miss any doses.   Take levothyroxine 125 mcg daily except every Sunday take half tablet only.   Please come back for a follow-up appointment in 2 months.   Will check your dose of thyroid medication at time of follow up.

## 2013-10-29 ENCOUNTER — Ambulatory Visit (INDEPENDENT_AMBULATORY_CARE_PROVIDER_SITE_OTHER): Payer: Medicare Other

## 2013-10-29 DIAGNOSIS — Z23 Encounter for immunization: Secondary | ICD-10-CM

## 2013-11-05 ENCOUNTER — Other Ambulatory Visit: Payer: Self-pay | Admitting: *Deleted

## 2013-11-05 MED ORDER — SIMVASTATIN 40 MG PO TABS: ORAL_TABLET | ORAL | Status: DC

## 2013-12-25 ENCOUNTER — Encounter: Payer: Self-pay | Admitting: Endocrinology

## 2013-12-25 ENCOUNTER — Ambulatory Visit (INDEPENDENT_AMBULATORY_CARE_PROVIDER_SITE_OTHER): Payer: Medicare Other | Admitting: Endocrinology

## 2013-12-25 VITALS — BP 148/76 | HR 71 | Temp 97.6°F | Resp 16 | Ht 68.0 in | Wt 194.0 lb

## 2013-12-25 DIAGNOSIS — E890 Postprocedural hypothyroidism: Secondary | ICD-10-CM

## 2013-12-25 LAB — T4, FREE: FREE T4: 1.07 ng/dL (ref 0.60–1.60)

## 2013-12-25 LAB — TSH: TSH: 0.52 u[IU]/mL (ref 0.35–4.50)

## 2013-12-25 NOTE — Patient Instructions (Signed)
Labs today. Based on these results, will decide whether further dose adjustment is necessary.  Please come back for a follow-up appointment in 4 months

## 2013-12-25 NOTE — Progress Notes (Signed)
Pre-visit discussion using our clinic review tool. No additional management support is needed unless otherwise documented below in the visit note.  

## 2013-12-25 NOTE — Progress Notes (Signed)
Reason for visit: Hypothyroidism  HPI  [name] is a 73 y.o.-year-old female, for management for post-surgical hypothyroidism.   she has been on Levothyroxine 125 mcg * 6.5 tablets weekly. Now taking the medication correctly, separate from the food. Hasn't missed any doses recently. She has been off Estrace since about Summer 2015. She has been doing well since last visit. Doesn't notice any major change in her health after the last dose decrease in Levothyroxine.    I reviewed pt's thyroid tests: TSH has been slowly downtrending this year. Free thyroid hormone levels normal range. Lab Results  Component Value Date   TSH 0.27* 10/08/2013   TSH 0.26* 08/27/2013   TSH 0.47 04/16/2013   TSH 1.91 01/17/2012   TSH 2.23 08/25/2010   TSH 0.75 03/12/2004   TSH 0.174 07/20/2001   FREET4 0.92 10/08/2013   FREET4 1.00 08/27/2013   FREET4 0.76 01/17/2012    Lab Results  Component Value Date   T3FREE 3.0 10/08/2013     Review of systems: [ x  ] complains of    [  ] denies [  ] weight gain  [ x ] constipation-chronic  [  ] fatigue   [  ] dry skin  [  ] cold intolerance [  ] hair loss [  ] noticing any enlargement in size of thyroid [  ] lumps in neck [  ] dysphagia [  ] change in voice [  ]  SOB with lying down.  I have reviewed the patient's past medical history, medications and allergies.   Current Outpatient Prescriptions on File Prior to Visit  Medication Sig Dispense Refill  . aspirin EC 81 MG tablet Take 81 mg by mouth daily.    [name] atenolol (TENORMIN) 50 MG tablet TAKE 1 TABLET BY MOUTH EVERY DAY 90 tablet 3  . Cholecalciferol (VITAMIN D-3) 1000 UNITS CAPS Take 1 capsule by mouth daily.    [name] levothyroxine (SYNTHROID, LEVOTHROID) 125 MCG tablet TAKE 1 TABLET BY MOUTH EVERY DAY except every Sunday take half tablet 84 tablet 1  . simvastatin (ZOCOR) 40 MG tablet TAKE 1 TABLET BY MOUTH AT BEDTIME 90 tablet 1  . butalbital-aspirin-caffeine (FIORINAL) 50-325-40 MG per capsule  Take 1 capsule by mouth 2 (two) times daily as needed for headache. (Patient not taking: Reported on 12/25/2013) 60 capsule 0  . estradiol (ESTRACE) 1 MG tablet Take 1 tablet (1 mg total) by mouth daily. (Patient not taking: Reported on 12/25/2013) 90 tablet 0  . ibuprofen (ADVIL,MOTRIN) 800 MG tablet TAKE 1 TABLET BY MOUTH EVERY 4 TO 6 HOURS AS NEEDED (Patient not taking: Reported on 12/25/2013) 180 tablet 1  . Multiple Vitamin (MULTIVITAMIN) tablet Take 1 tablet by mouth daily.      . mupirocin ointment (BACTROBAN) 2 % Apply topically 2 (two) times daily. (Patient not taking: Reported on 12/25/2013) 22 g 0  . omega-3 acid ethyl esters (LOVAZA) 1 G capsule Take 1 g by mouth daily.    . SUMAtriptan (IMITREX) 20 MG/ACT nasal spray Place 1 spray into the nose as needed.       No current facility-administered medications on file prior to visit.   Allergies  Allergen Reactions  . Codeine     REACTION: NAUSEA AND VOMITING  . Tramadol     Vomiting, LOC     PE: BP 148/76 mmHg  Pulse 71  Temp(Src) 97.6 F (36.4 C) (Oral)  Resp 16  Ht 5\' 8"  (1.727 m)  Wt 194  lb (87.998 kg)  BMI 29.50 kg/m2  SpO2 97% Wt Readings from Last 3 Encounters:  12/25/13 194 lb (87.998 kg)  10/23/13 193 lb 12 oz (87.884 kg)  09/03/13 191 lb 4 oz (86.75 kg)    HEENT: [place]/AT, EOMI, no icterus, no proptosis, no chemosis, no mild lid lag, no retraction, eyes close completely Neck: thyroid gland - not palpable, no tracheal deviation; negative Pemberton's sign; no lymphadenopathy, well healed surgical scar Lungs: good air entry, clear bilaterally Heart: S1&S2 normal, regular rate & rhythm; no murmurs, rubs or gallops Ext: no tremor in hands bilaterally, no edema, 2+ DP/PT pulses, good muscle mass Neuro: normal gait, 2+ reflexes bilaterally, normal 5/5 strength, no proximal myopathy  Derm: no pretibial myxoedema/skin dryness   ASSESSMENT: 1. Post surgical Hypothyroidism, uncontrolled   PLAN:  Problem List Items  Addressed This Visit      Endocrine   Postsurgical hypothyroidism - Primary    Recent thyroid levels suggest over-replacement with levothyroxine. She will most likely need a lower dose of LT4 now that she is off the estrace and as her age advances, her goal TSH levels could be relaxed to the mid normal range.   Check thyroid labs today.  Appears clinically euthyroid today.  Asked her to continue Levothyroxine 125 mcg at 6.5 tablets weekly ( 1 tablet daily except every Sunday when she takes half tablet only). Will decide on whether she needs further adjustments based on lab results from today.  Now has been taking her medication as directed.   RTC 4 months.       Relevant Orders      TSH      T4, free       -RTC in 4 months  [name] [facility]  12/25/2013   8:29 AM

## 2013-12-25 NOTE — Assessment & Plan Note (Signed)
Recent thyroid levels suggest over-replacement with levothyroxine. She will most likely need a lower dose of LT4 now that she is off the estrace and as her age advances, her goal TSH levels could be relaxed to the mid normal range.   Check thyroid labs today.  Appears clinically euthyroid today.  Asked her to continue Levothyroxine 125 mcg at 6.5 tablets weekly ( 1 tablet daily except every Sunday when she takes half tablet only). Will decide on whether she needs further adjustments based on lab results from today.  Now has been taking her medication as directed.   RTC 4 months.

## 2014-02-27 ENCOUNTER — Encounter: Payer: Self-pay | Admitting: Family Medicine

## 2014-02-27 ENCOUNTER — Ambulatory Visit (INDEPENDENT_AMBULATORY_CARE_PROVIDER_SITE_OTHER): Payer: Medicare Other | Admitting: Family Medicine

## 2014-02-27 VITALS — BP 158/82 | HR 68 | Temp 97.4°F | Ht 68.0 in | Wt 193.1 lb

## 2014-02-27 DIAGNOSIS — M545 Low back pain, unspecified: Secondary | ICD-10-CM

## 2014-02-27 LAB — POCT URINALYSIS DIPSTICK
BILIRUBIN UA: NEGATIVE
Blood, UA: NEGATIVE
Glucose, UA: NEGATIVE
KETONES UA: NEGATIVE
LEUKOCYTES UA: NEGATIVE
Nitrite, UA: NEGATIVE
Protein, UA: NEGATIVE
Spec Grav, UA: 1.015
Urobilinogen, UA: 0.2
pH, UA: 6.5

## 2014-02-27 MED ORDER — MELOXICAM 15 MG PO TABS: 15.0000 mg | ORAL_TABLET | Freq: Every day | ORAL | Status: DC

## 2014-02-27 NOTE — Patient Instructions (Signed)
I think you have a back spasm  Use heat for 10 minutes at a time  Try meloxicam daily for 2 weeks with food - stop it if it bothers your stomach  Walking is good for your back  Do not stay still for too long -it will make you stiff   Update if not starting to improve in 2 weeks or if worsening

## 2014-02-27 NOTE — Progress Notes (Signed)
Subjective:    Patient ID: [name], female    DOB: 09/01/1940, 74 y.o.   MRN: [id number]  HPI Here with back pain  L lower back  "wants her kidneys checked"  No burning to urinate A little uncomfortable to wipe however  Frequency -comes and goes (in general does not drink enough water)  Some urgency - nl for her  Also has some urge incontinence   No fever or malaise   Some inc pain bending forward or twisting  No more or less comfortable position No radiation of pain to legs   Results for orders placed or performed in visit on 02/27/14  POCT urinalysis dipstick  Result Value Ref Range   Color, UA yellow    Clarity, UA clear    Glucose, UA neg    Bilirubin, UA neg    Ketones, UA neg    Spec Grav, UA 1.015    Blood, UA neg    pH, UA 6.5    Protein, UA neg    Urobilinogen, UA 0.2    Nitrite, UA neg    Leukocytes, UA Negative     No trauma Does have a new mattress - ? If she likes it   Patient Active Problem List   Diagnosis Date Noted  . Medicare annual wellness visit, subsequent 09/03/2013  . Routine general medical examination at a health care facility 09/03/2013  . HLD (hyperlipidemia) 04/16/2013  . HTN (hypertension) 05/08/2012  . Post menopausal syndrome 01/27/2012  . 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 UNSPECIFIED VENOUS INSUFFICIENCY 09/08/2007   Past Medical History  Diagnosis Date  . Venous stasis ulcer 11/1997    [facility] care center, [place]  . Hypothyroidism   . Hypercholesteremia   . Back pain   . Migraine    Past Surgical History  Procedure Laterality Date  . Abdominal hysterectomy      1 1/2 ovaries gone, second to endometriosis  . Appendectomy    . Thyroidectomy    . Hernia repair    . Rotator cuff repair    . Cystoscopy     History  Substance Use Topics  . Smoking status: Never Smoker   . Smokeless tobacco: Not on file  . Alcohol Use: No   Family History  Problem Relation Age of Onset  . Stroke Mother   .  Goiter Mother   . Stomach cancer Sister   . Heart attack Father   . Heart disease Father   . Heart attack Brother   . Kidney disease Brother   . Colon cancer Neg Hx   . Esophageal cancer Neg Hx    Allergies  Allergen Reactions  . Codeine     REACTION: NAUSEA AND VOMITING  . Tramadol     Vomiting, LOC   Current Outpatient Prescriptions on File Prior to Visit  Medication Sig Dispense Refill  . aspirin EC 81 MG tablet Take 81 mg by mouth daily.    [name] atenolol (TENORMIN) 50 MG tablet TAKE 1 TABLET BY MOUTH EVERY DAY 90 tablet 3  . butalbital-aspirin-caffeine (FIORINAL) 50-325-40 MG per capsule Take 1 capsule by mouth 2 (two) times daily as needed for headache. 60 capsule 0  . Cholecalciferol (VITAMIN D-3) 1000 UNITS CAPS Take 1 capsule by mouth daily.    [name] estradiol (ESTRACE) 1 MG tablet Take 1 tablet (1 mg total) by mouth daily. 90 tablet 0  . ibuprofen (ADVIL,MOTRIN) 800 MG tablet TAKE 1 TABLET BY MOUTH EVERY 4 TO 6  HOURS AS NEEDED 180 tablet 1  . levothyroxine (SYNTHROID, LEVOTHROID) 125 MCG tablet TAKE 1 TABLET BY MOUTH EVERY DAY except every Sunday take half tablet 84 tablet 1  . Multiple Vitamin (MULTIVITAMIN) tablet Take 1 tablet by mouth daily.      . mupirocin ointment (BACTROBAN) 2 % Apply topically 2 (two) times daily. 22 g 0  . omega-3 acid ethyl esters (LOVAZA) 1 G capsule Take 1 g by mouth daily.    . simvastatin (ZOCOR) 40 MG tablet TAKE 1 TABLET BY MOUTH AT BEDTIME 90 tablet 1  . SUMAtriptan (IMITREX) 20 MG/ACT nasal spray Place 1 spray into the nose as needed.       No current facility-administered medications on file prior to visit.    Review of Systems Review of Systems  Constitutional: Negative for fever, appetite change, fatigue and unexpected weight change.  Eyes: Negative for pain and visual disturbance.  Respiratory: Negative for cough and shortness of breath.   Cardiovascular: Negative for cp or palpitations    Gastrointestinal: Negative for nausea, diarrhea  and constipation.  Genitourinary: Negative for urgency and frequency.  Skin: Negative for pallor or rash  MSK pos for L lower back pain without radiation , neg for joint swelling   Neurological: Negative for weakness, light-headedness, numbness and headaches.  Hematological: Negative for adenopathy. Does not bruise/bleed easily.  Psychiatric/Behavioral: Negative for dysphoric mood. The patient is not nervous/anxious.         Objective:   Physical Exam  Constitutional: She appears well-developed and well-nourished. No distress.  overwt and well app  HENT:  Head: Normocephalic and atraumatic.  Eyes: Conjunctivae and EOM are normal. Pupils are equal, round, and reactive to light.  Cardiovascular: Normal rate and regular rhythm.   Abdominal: Soft. Bowel sounds are normal. She exhibits no distension and no mass. There is no tenderness. There is no rebound and no guarding.  No suprapubic tenderness or fullness  No cva tenderness   Musculoskeletal: She exhibits tenderness. She exhibits no edema.  Tender over L lumbar musculature Spasm noted  Pain to flex L and extend  Neg SLR No neuro changes  Neurological: She is alert. She has normal reflexes. She displays no atrophy. No cranial nerve deficit or sensory deficit. She exhibits normal muscle tone. Coordination and gait normal.  Skin: Skin is warm and dry. No rash noted. No erythema.  Psychiatric: She has a normal mood and affect.          Assessment & Plan:   Problem List Items Addressed This Visit      Other   Left low back pain    Suspect strain/spasm  Neg ua  Disc use of heat/massage and stretching  meloxicam with food for short term Update if not improved       Relevant Medications   meloxicam (MOBIC) tablet    Other Visit Diagnoses    Low back pain, unspecified back pain laterality, with sciatica presence unspecified    -  Primary    Relevant Medications    meloxicam (MOBIC) tablet    Other Relevant Orders     POCT urinalysis dipstick (Completed)

## 2014-02-27 NOTE — Progress Notes (Signed)
Pre visit review using our clinic review tool, if applicable. No additional management support is needed unless otherwise documented below in the visit note. 

## 2014-02-28 NOTE — Assessment & Plan Note (Signed)
Suspect strain/spasm  Neg ua  Disc use of heat/massage and stretching  meloxicam with food for short term Update if not improved

## 2014-03-03 ENCOUNTER — Other Ambulatory Visit: Payer: Self-pay | Admitting: Family Medicine

## 2014-03-26 ENCOUNTER — Other Ambulatory Visit: Payer: Self-pay | Admitting: Family Medicine

## 2014-03-28 DIAGNOSIS — C44319 Basal cell carcinoma of skin of other parts of face: Secondary | ICD-10-CM | POA: Diagnosis not present

## 2014-04-04 DIAGNOSIS — C44319 Basal cell carcinoma of skin of other parts of face: Secondary | ICD-10-CM | POA: Diagnosis not present

## 2014-04-28 ENCOUNTER — Other Ambulatory Visit: Payer: Self-pay | Admitting: Family Medicine

## 2014-04-29 ENCOUNTER — Other Ambulatory Visit: Payer: Self-pay | Admitting: *Deleted

## 2014-04-29 MED ORDER — LEVOTHYROXINE SODIUM 125 MCG PO TABS: ORAL_TABLET | ORAL | Status: DC

## 2014-04-30 ENCOUNTER — Encounter: Payer: Self-pay | Admitting: Endocrinology

## 2014-04-30 ENCOUNTER — Ambulatory Visit (INDEPENDENT_AMBULATORY_CARE_PROVIDER_SITE_OTHER): Payer: Medicare Other | Admitting: Endocrinology

## 2014-04-30 ENCOUNTER — Other Ambulatory Visit: Payer: Self-pay | Admitting: Endocrinology

## 2014-04-30 VITALS — BP 138/82 | HR 70 | Resp 14 | Ht 68.0 in | Wt 194.5 lb

## 2014-04-30 DIAGNOSIS — E890 Postprocedural hypothyroidism: Secondary | ICD-10-CM | POA: Diagnosis not present

## 2014-04-30 DIAGNOSIS — Z1231 Encounter for screening mammogram for malignant neoplasm of breast: Secondary | ICD-10-CM | POA: Diagnosis not present

## 2014-04-30 LAB — TSH: TSH: 0.42 u[IU]/mL (ref 0.35–4.50)

## 2014-04-30 LAB — T4, FREE: Free T4: 0.96 ng/dL (ref 0.60–1.60)

## 2014-04-30 MED ORDER — LEVOTHYROXINE SODIUM 125 MCG PO TABS: ORAL_TABLET | ORAL | Status: DC

## 2014-04-30 NOTE — Patient Instructions (Signed)
Continue current thyroid hormone dosing.  Check labs and adjust dose based on levels.   Please come back for a follow-up appointment in 6 months

## 2014-04-30 NOTE — Progress Notes (Signed)
Reason for visit: Hypothyroidism  HPI  [name] is a 74 y.o.-year-old female, for management for post-surgical hypothyroidism.   she has been on Levothyroxine 125 mcg * 6.5 tablets weekly. Now taking the medication correctly, separate from the food. Hasn't missed any doses recently. She has been off Estrace since about Summer 2015. She has been doing well since last visit. Doesn't notice any major change in her health after the last dose decrease in Levothyroxine.    I reviewed pt's thyroid tests: TSH has been slowly downtrending2015. Free thyroid hormone levels normal range. Lab Results  Component Value Date   TSH 0.52 12/25/2013   TSH 0.27* 10/08/2013   TSH 0.26* 08/27/2013   TSH 0.47 04/16/2013   TSH 1.91 01/17/2012   TSH 2.23 08/25/2010   TSH 0.75 03/12/2004   TSH 0.174 07/20/2001   FREET4 1.07 12/25/2013   FREET4 0.92 10/08/2013   FREET4 1.00 08/27/2013   FREET4 0.76 01/17/2012    Lab Results  Component Value Date   T3FREE 3.0 10/08/2013     Review of systems: [ x  ] complains of    [  ] denies [  ] weight gain  [ x ] constipation-chronic  [  ] fatigue   [  ] dry skin  [  ] cold intolerance [  ] hair loss [  ] noticing any enlargement in size of thyroid [  ] lumps in neck [  ] dysphagia [  ] change in voice [  ]  SOB with lying down. X hot flashes after coming off estrace  I have reviewed the patient's past medical history, medications and allergies.   Current Outpatient Prescriptions on File Prior to Visit  Medication Sig Dispense Refill  . aspirin EC 81 MG tablet Take 81 mg by mouth daily.    [name] atenolol (TENORMIN) 50 MG tablet TAKE 1 TABLET BY MOUTH EVERY DAY 90 tablet 1  . butalbital-aspirin-caffeine (FIORINAL) 50-325-40 MG per capsule Take 1 capsule by mouth 2 (two) times daily as needed for headache. 60 capsule 0  . Cholecalciferol (VITAMIN D-3) 1000 UNITS CAPS Take 1 capsule by mouth daily.    [name] estradiol (ESTRACE) 1 MG tablet Take 1 tablet (1 mg  total) by mouth daily. 90 tablet 0  . ibuprofen (ADVIL,MOTRIN) 800 MG tablet TAKE 1 TABLET BY MOUTH EVERY 4 TO 6 HOURS AS NEEDED 180 tablet 1  . levothyroxine (SYNTHROID, LEVOTHROID) 125 MCG tablet TAKE 1 TABLET BY MOUTH EVERY DAY except every Sunday take half tablet 84 tablet 1  . meloxicam (MOBIC) 15 MG tablet Take 1 tablet (15 mg total) by mouth daily. With a meal as needed for back pain 30 tablet 0  . Multiple Vitamin (MULTIVITAMIN) tablet Take 1 tablet by mouth daily.      . mupirocin ointment (BACTROBAN) 2 % Apply topically 2 (two) times daily. 22 g 0  . omega-3 acid ethyl esters (LOVAZA) 1 G capsule Take 1 g by mouth daily.    . simvastatin (ZOCOR) 40 MG tablet TAKE 1 TABLET BY MOUTH AT BEDTIME 90 tablet 1  . SUMAtriptan (IMITREX) 20 MG/ACT nasal spray Place 1 spray into the nose as needed.       No current facility-administered medications on file prior to visit.   Allergies  Allergen Reactions  . Codeine     REACTION: NAUSEA AND VOMITING  . Tramadol     Vomiting, LOC     PE: BP 138/82 mmHg  Pulse 70  Resp 14  Ht 5\' 8"  (1.727 m)  Wt 194 lb 8 oz (88.225 kg)  BMI 29.58 kg/m2  SpO2 98% Wt Readings from Last 3 Encounters:  04/30/14 194 lb 8 oz (88.225 kg)  02/27/14 193 lb 1.9 oz (87.599 kg)  12/25/13 194 lb (87.998 kg)    HEENT: [place]/AT, EOMI, no icterus, no proptosis, no chemosis, no mild lid lag, no retraction, eyes close completely Neck: thyroid gland - not palpable, no tracheal deviation; negative Pemberton's sign; no lymphadenopathy, well healed surgical scar Lungs: good air entry, clear bilaterally Heart: S1&S2 normal, regular rate & rhythm; no murmurs, rubs or gallops Ext: no tremor in hands bilaterally, no edema, 2+ DP/PT pulses, good muscle mass Neuro: normal gait, 2+ reflexes bilaterally, normal 5/5 strength, no proximal myopathy  Derm: no pretibial myxoedema/skin dryness   ASSESSMENT: 1. Post surgical Hypothyroidism, controlled   PLAN:  Problem List Items  Addressed This Visit      Endocrine   Postsurgical hypothyroidism - Primary    Last few thyroid levels suggest over-replacement with levothyroxine. She will most likely need a lower dose of LT4 now that she is off the estrace and as her age advances, her goal TSH levels could be relaxed to the mid normal range.   Check thyroid labs today.  Appears clinically euthyroid today.  Asked her to continue Levothyroxine 125 mcg at 6.5 tablets weekly ( 1 tablet daily except every Sunday when she takes half tablet only). Will decide on whether she needs further adjustments based on lab results from today.  Now has been taking her medication as directed.          Relevant Orders   TSH   T4, free       -RTC in 6 months  [name] [facility]  04/30/2014   8:34 AM

## 2014-04-30 NOTE — Assessment & Plan Note (Signed)
Last few thyroid levels suggest over-replacement with levothyroxine. She will most likely need a lower dose of LT4 now that she is off the estrace and as her age advances, her goal TSH levels could be relaxed to the mid normal range.   Check thyroid labs today.  Appears clinically euthyroid today.  Asked her to continue Levothyroxine 125 mcg at 6.5 tablets weekly ( 1 tablet daily except every Sunday when she takes half tablet only). Will decide on whether she needs further adjustments based on lab results from today.  Now has been taking her medication as directed.

## 2014-04-30 NOTE — Progress Notes (Signed)
Pre visit review using our clinic review tool, if applicable. No additional management support is needed unless otherwise documented below in the visit note. 

## 2014-05-15 ENCOUNTER — Ambulatory Visit (INDEPENDENT_AMBULATORY_CARE_PROVIDER_SITE_OTHER): Payer: Medicare Other | Admitting: Primary Care

## 2014-05-15 ENCOUNTER — Encounter: Payer: Self-pay | Admitting: Primary Care

## 2014-05-15 VITALS — BP 132/72 | HR 67 | Temp 97.5°F | Wt 192.2 lb

## 2014-05-15 DIAGNOSIS — H6123 Impacted cerumen, bilateral: Secondary | ICD-10-CM

## 2014-05-15 DIAGNOSIS — J029 Acute pharyngitis, unspecified: Secondary | ICD-10-CM

## 2014-05-15 LAB — POCT RAPID STREP A (OFFICE): Rapid Strep A Screen: NEGATIVE

## 2014-05-15 NOTE — Progress Notes (Signed)
Pre visit review using our clinic review tool, if applicable. No additional management support is needed unless otherwise documented below in the visit note. 

## 2014-05-15 NOTE — Progress Notes (Signed)
Subjective:    Patient ID: [name], female    DOB: 1940-06-03, 74 y.o.   MRN: [id number]  HPI  [name] is a 74 year old female who presents today with a chief complaint of sore throat that has been present since yesterday morning. She also reports nasal congestion and occasional non-productive cough. She denies ear pain, fevers, chills, difficulty swallowing. She has taken OTC tylenol for her pain.   Review of Systems  Constitutional: Negative for fever and chills.  HENT: Positive for congestion, rhinorrhea and sore throat. Negative for ear pain, postnasal drip and sinus pressure.   Eyes: Negative for itching.  Respiratory: Positive for cough. Negative for shortness of breath.   Cardiovascular: Negative for chest pain.  Gastrointestinal: Negative for nausea and vomiting.  Musculoskeletal: Negative for myalgias.  Neurological: Negative for dizziness.       Mild headache       Past Medical History  Diagnosis Date  . Venous stasis ulcer 11/1997    Wound care center, [place]  . Hypothyroidism   . Hypercholesteremia   . Back pain   . Migraine     History   Social History  . Marital Status: Married    Spouse Name: N/A  . Number of Children: 2  . Years of Education: N/A   Occupational History  . Retired    Social History Main Topics  . Smoking status: Never Smoker   . Smokeless tobacco: Not on file  . Alcohol Use: No  . Drug Use: No  . Sexual Activity: Not on file   Other Topics Concern  . Not on file   Social History Narrative   Lives with husband   Does not have living will.   Would desire CPR but would not want to be on life support for prolonged period of time if futile.          Past Surgical History  Procedure Laterality Date  . Abdominal hysterectomy      1 1/2 ovaries gone, second to endometriosis  . Appendectomy    . Thyroidectomy    . Hernia repair    . Rotator cuff repair    . Cystoscopy      Family History  Problem  Relation Age of Onset  . Stroke Mother   . Goiter Mother   . Stomach cancer Sister   . Heart attack Father   . Heart disease Father   . Heart attack Brother   . Kidney disease Brother   . Colon cancer Neg Hx   . Esophageal cancer Neg Hx     Allergies  Allergen Reactions  . Codeine     REACTION: NAUSEA AND VOMITING  . Tramadol     Vomiting, LOC    Current Outpatient Prescriptions on File Prior to Visit  Medication Sig Dispense Refill  . aspirin EC 81 MG tablet Take 81 mg by mouth daily.    [name] atenolol (TENORMIN) 50 MG tablet TAKE 1 TABLET BY MOUTH EVERY DAY 90 tablet 1  . butalbital-aspirin-caffeine (FIORINAL) 50-325-40 MG per capsule Take 1 capsule by mouth 2 (two) times daily as needed for headache. 60 capsule 0  . Cholecalciferol (VITAMIN D-3) 1000 UNITS CAPS Take 1 capsule by mouth daily.    [name] estradiol (ESTRACE) 1 MG tablet Take 1 tablet (1 mg total) by mouth daily. 90 tablet 0  . ibuprofen (ADVIL,MOTRIN) 800 MG tablet TAKE 1 TABLET BY MOUTH EVERY 4 TO 6 HOURS AS NEEDED 180  tablet 1  . levothyroxine (SYNTHROID, LEVOTHROID) 125 MCG tablet TAKE 1 TABLET BY MOUTH EVERY DAY except every Sunday -skip a pill 80 tablet 1  . meloxicam (MOBIC) 15 MG tablet Take 1 tablet (15 mg total) by mouth daily. With a meal as needed for back pain 30 tablet 0  . Multiple Vitamin (MULTIVITAMIN) tablet Take 1 tablet by mouth daily.      . mupirocin ointment (BACTROBAN) 2 % Apply topically 2 (two) times daily. 22 g 0  . omega-3 acid ethyl esters (LOVAZA) 1 G capsule Take 1 g by mouth daily.    . simvastatin (ZOCOR) 40 MG tablet TAKE 1 TABLET BY MOUTH AT BEDTIME 90 tablet 1  . SUMAtriptan (IMITREX) 20 MG/ACT nasal spray Place 1 spray into the nose as needed.       No current facility-administered medications on file prior to visit.    BP 132/72 mmHg  Pulse 67  Temp(Src) 97.5 F (36.4 C) (Oral)  Wt 192 lb 4 oz (87.204 kg)  SpO2 98%    Objective:   Physical Exam  Constitutional: She is  oriented to person, place, and time. She appears well-developed.  Does not appear acutely ill.  HENT:  Right Ear: Tympanic membrane, external ear and ear canal normal.  Left Ear: Tympanic membrane, external ear and ear canal normal.  Nose: Nose normal.  Mouth/Throat: Oropharynx is clear and moist.  Moderate cerumen impaction present to bilateral ears. Cerumen removed using a plastic curette. TM's unremarkable post cerumen removal.   Eyes: Conjunctivae are normal. Pupils are equal, round, and reactive to light.  Neck: Neck supple.  Cardiovascular: Normal rate and regular rhythm.   Pulmonary/Chest: Effort normal and breath sounds normal.  Lymphadenopathy:    She has no cervical adenopathy.  Neurological: She is alert and oriented to person, place, and time.  Skin: Skin is warm and dry.  Psychiatric: She has a normal mood and affect.          Assessment & Plan:  Pharyngitis:  Sore throat for one day. Rapid strep negative. Exam unremarkable. Ibuprofen 400 mg and chloraseptic spray for pain. Delysum OTC for cough, saline nasal spray for nasal congestion. Follow up if no improvement of symptoms become worse.

## 2014-05-15 NOTE — Patient Instructions (Signed)
Your strep test was negative.  You may take 2 motrin tablets every 6 hours as needed for pain in your throat. You may also try chloraseptic throat spray for your pain. Saline nasal spray may be purchased over the counter to assist with nasal congestion. You may try Delsym cough suppressant over the counter for cough every 12 hours. It was a pleasure meeting you today!  Pharyngitis Pharyngitis is redness, pain, and swelling (inflammation) of your pharynx.  CAUSES  Pharyngitis is usually caused by infection. Most of the time, these infections are from viruses (viral) and are part of a cold. However, sometimes pharyngitis is caused by bacteria (bacterial). Pharyngitis can also be caused by allergies. Viral pharyngitis may be spread from person to person by coughing, sneezing, and personal items or utensils (cups, forks, spoons, toothbrushes). Bacterial pharyngitis may be spread from person to person by more intimate contact, such as kissing.  SIGNS AND SYMPTOMS  Symptoms of pharyngitis include:   Sore throat.   Tiredness (fatigue).   Low-grade fever.   Headache.  Joint pain and muscle aches.  Skin rashes.  Swollen lymph nodes.  Plaque-like film on throat or tonsils (often seen with bacterial pharyngitis). DIAGNOSIS  Your health care provider will ask you questions about your illness and your symptoms. Your medical history, along with a physical exam, is often all that is needed to diagnose pharyngitis. Sometimes, a rapid strep test is done. Other lab tests may also be done, depending on the suspected cause.  TREATMENT  Viral pharyngitis will usually get better in 3-4 days without the use of medicine. Bacterial pharyngitis is treated with medicines that kill germs (antibiotics).  HOME CARE INSTRUCTIONS   Drink enough water and fluids to keep your urine clear or pale yellow.   Only take over-the-counter or prescription medicines as directed by your health care provider:   If  you are prescribed antibiotics, make sure you finish them even if you start to feel better.   Do not take aspirin.   Get lots of rest.   Gargle with 8 oz of salt water ( tsp of salt per 1 qt of water) as often as every 1-2 hours to soothe your throat.   Throat lozenges (if you are not at risk for choking) or sprays may be used to soothe your throat. SEEK MEDICAL CARE IF:   You have large, tender lumps in your neck.  You have a rash.  You cough up green, yellow-brown, or bloody spit. SEEK IMMEDIATE MEDICAL CARE IF:   Your neck becomes stiff.  You drool or are unable to swallow liquids.  You vomit or are unable to keep medicines or liquids down.  You have severe pain that does not go away with the use of recommended medicines.  You have trouble breathing (not caused by a stuffy nose). MAKE SURE YOU:   Understand these instructions.  Will watch your condition.  Will get help right away if you are not doing well or get worse. Document Released: 01/04/2005 Document Revised: 10/25/2012 Document Reviewed: 09/11/2012 [facility] Patient Information 2015 [place], [place]. This information is not intended to replace advice given to you by your health care provider. Make sure you discuss any questions you have with your health care provider.

## 2014-05-17 ENCOUNTER — Telehealth: Payer: Self-pay | Admitting: Primary Care

## 2014-05-17 DIAGNOSIS — J029 Acute pharyngitis, unspecified: Secondary | ICD-10-CM

## 2014-05-17 MED ORDER — BENZONATATE 200 MG PO CAPS: 200.0000 mg | ORAL_CAPSULE | Freq: Three times a day (TID) | ORAL | Status: DC | PRN

## 2014-05-17 MED ORDER — AZITHROMYCIN 250 MG PO TABS: ORAL_TABLET | ORAL | Status: DC

## 2014-05-17 NOTE — Telephone Encounter (Signed)
Pt was seen Wed 06/14/14. She now has a cough that is waking her up at night. She would abx (per pt  z-pac) and cough syrup for cough.  [facility]  Pt request call back-ok to leave vm

## 2014-05-17 NOTE — Telephone Encounter (Signed)
Spoke with [name] who reports her symptoms have worsened and that her throat is inflamed. During her appointment she was negative for strep, and due to only one day of symptoms antibiotics were not provided. I offered her medication for cough at the time for which she declined. She is now requesting medication for cough. She reports she's had these symptoms in the past and is persistent on receiving a z-pak. Z-pak and Tessalon pearls sent to her pharmacy. Follow up if no improvement by Tuesday next week.

## 2014-06-26 ENCOUNTER — Encounter: Payer: Self-pay | Admitting: Primary Care

## 2014-06-26 ENCOUNTER — Ambulatory Visit (INDEPENDENT_AMBULATORY_CARE_PROVIDER_SITE_OTHER): Payer: Medicare Other | Admitting: Primary Care

## 2014-06-26 VITALS — BP 130/72 | HR 72 | Temp 97.7°F | Ht 68.0 in | Wt 195.4 lb

## 2014-06-26 DIAGNOSIS — W57XXXA Bitten or stung by nonvenomous insect and other nonvenomous arthropods, initial encounter: Secondary | ICD-10-CM

## 2014-06-26 DIAGNOSIS — T148 Other injury of unspecified body region: Secondary | ICD-10-CM

## 2014-06-26 MED ORDER — MUPIROCIN 2 % EX OINT: TOPICAL_OINTMENT | Freq: Two times a day (BID) | CUTANEOUS | Status: DC

## 2014-06-26 NOTE — Progress Notes (Signed)
Subjective:    Patient ID: [name], female    DOB: 21-Dec-1940, 74 y.o.   MRN: [id number]  HPI  [name] is a 74 year old female who presents today with a chief complaint of sore to right leg. The sore has been present for 2 weeks without improvement. She believes she was bitten by an insect as she mows the lawn frequently in her shorts. She does not recall finding a tick to her body in the last several weeks. She denies itching, pain, weakness, headaches. She's tried applying neosporin twice without improvement in her skin.   Review of Systems  Constitutional: Negative for fever and chills.  Respiratory: Negative for shortness of breath.   Cardiovascular: Negative for chest pain.  Skin: Positive for wound.  Neurological: Negative for dizziness, weakness and headaches.       Past Medical History  Diagnosis Date  . Venous stasis ulcer 11/1997    [facility] care [facility], [place]  . Hypothyroidism   . Hypercholesteremia   . Back pain   . Migraine     History   Social History  . Marital Status: Married    Spouse Name: N/A  . Number of Children: 2  . Years of Education: N/A   Occupational History  . Retired    Social History Main Topics  . Smoking status: Never Smoker   . Smokeless tobacco: Not on file  . Alcohol Use: No  . Drug Use: No  . Sexual Activity: Not on file   Other Topics Concern  . Not on file   Social History Narrative   Lives with husband   Does not have living will.   Would desire CPR but would not want to be on life support for prolonged period of time if futile.          Past Surgical History  Procedure Laterality Date  . Abdominal hysterectomy      1 1/2 ovaries gone, second to endometriosis  . Appendectomy    . Thyroidectomy    . Hernia repair    . Rotator cuff repair    . Cystoscopy      Family History  Problem Relation Age of Onset  . Stroke Mother   . Goiter Mother   . Stomach cancer Sister   . Heart attack Father   .  Heart disease Father   . Heart attack Brother   . Kidney disease Brother   . Colon cancer Neg Hx   . Esophageal cancer Neg Hx     Allergies  Allergen Reactions  . Codeine     REACTION: NAUSEA AND VOMITING  . Tramadol     Vomiting, LOC    Current Outpatient Prescriptions on File Prior to Visit  Medication Sig Dispense Refill  . aspirin EC 81 MG tablet Take 81 mg by mouth daily.    [name] atenolol (TENORMIN) 50 MG tablet TAKE 1 TABLET BY MOUTH EVERY DAY 90 tablet 1  . butalbital-aspirin-caffeine (FIORINAL) 50-325-40 MG per capsule Take 1 capsule by mouth 2 (two) times daily as needed for headache. 60 capsule 0  . Cholecalciferol (VITAMIN D-3) 1000 UNITS CAPS Take 1 capsule by mouth daily.    [name] estradiol (ESTRACE) 1 MG tablet Take 1 tablet (1 mg total) by mouth daily. 90 tablet 0  . ibuprofen (ADVIL,MOTRIN) 800 MG tablet TAKE 1 TABLET BY MOUTH EVERY 4 TO 6 HOURS AS NEEDED 180 tablet 1  . levothyroxine (SYNTHROID, LEVOTHROID) 125 MCG tablet TAKE  1 TABLET BY MOUTH EVERY DAY except every Sunday -skip a pill 80 tablet 1  . Multiple Vitamin (MULTIVITAMIN) tablet Take 1 tablet by mouth daily.      [name] omega-3 acid ethyl esters (LOVAZA) 1 G capsule Take 1 g by mouth daily.    . simvastatin (ZOCOR) 40 MG tablet TAKE 1 TABLET BY MOUTH AT BEDTIME 90 tablet 1  . SUMAtriptan (IMITREX) 20 MG/ACT nasal spray Place 1 spray into the nose as needed.      . meloxicam (MOBIC) 15 MG tablet Take 1 tablet (15 mg total) by mouth daily. With a meal as needed for back pain (Patient not taking: Reported on 06/26/2014) 30 tablet 0   No current facility-administered medications on file prior to visit.    BP 130/72 mmHg  Pulse 72  Temp(Src) 97.7 F (36.5 C) (Oral)  Ht 5\' 8"  (1.727 m)  Wt 195 lb 6.4 oz (88.633 kg)  BMI 29.72 kg/m2  SpO2 98%    Objective:   Physical Exam  Constitutional: She appears well-nourished. She does not appear ill.  Cardiovascular: Normal rate and regular rhythm.   Pulmonary/Chest:  Effort normal and breath sounds normal.  Skin: Skin is dry.  1 cm scabbed over wound to right anterior lower leg. No drainage. Mild erythema surrounding scabbed area.          Assessment & Plan:  Wound:  1 cm scabbed sore present to right anterior lower leg. Mild erythema surrounding scabbed area. Denies fatigue, weakness, headaches, fevers. Suspect insect bite. No systemic s/s of infection. Treat with mupirocin ointment twice daily. Call if no improvement in one week.

## 2014-06-26 NOTE — Progress Notes (Signed)
Pre visit review using our clinic review tool, if applicable. No additional management support is needed unless otherwise documented below in the visit note. 

## 2014-06-26 NOTE — Patient Instructions (Signed)
Apply cream twice daily to wound. Keep wound covered with a band aid if wearing pants. Call if no improvement in wound (or if wound gets larger) in one week. It was nice seeing you!

## 2014-07-01 ENCOUNTER — Other Ambulatory Visit (INDEPENDENT_AMBULATORY_CARE_PROVIDER_SITE_OTHER): Payer: Medicare Other

## 2014-07-01 ENCOUNTER — Telehealth: Payer: Self-pay | Admitting: *Deleted

## 2014-07-01 ENCOUNTER — Other Ambulatory Visit: Payer: Self-pay | Admitting: Endocrinology

## 2014-07-01 DIAGNOSIS — E890 Postprocedural hypothyroidism: Secondary | ICD-10-CM

## 2014-07-01 LAB — T4, FREE: Free T4: 0.83 ng/dL (ref 0.60–1.60)

## 2014-07-01 LAB — TSH: TSH: 0.47 u[IU]/mL (ref 0.35–4.50)

## 2014-07-01 NOTE — Telephone Encounter (Signed)
Labs and dx?  

## 2014-07-01 NOTE — Telephone Encounter (Signed)
Orders in emr. thanks

## 2014-07-02 ENCOUNTER — Other Ambulatory Visit: Payer: Self-pay | Admitting: Endocrinology

## 2014-07-02 DIAGNOSIS — E890 Postprocedural hypothyroidism: Secondary | ICD-10-CM

## 2014-07-02 MED ORDER — LEVOTHYROXINE SODIUM 100 MCG PO TABS: 100.0000 ug | ORAL_TABLET | Freq: Every day | ORAL | Status: DC

## 2014-07-03 ENCOUNTER — Encounter: Payer: Self-pay | Admitting: *Deleted

## 2014-07-04 ENCOUNTER — Encounter: Payer: Self-pay | Admitting: Endocrinology

## 2014-07-04 ENCOUNTER — Ambulatory Visit (INDEPENDENT_AMBULATORY_CARE_PROVIDER_SITE_OTHER): Payer: Medicare Other | Admitting: Endocrinology

## 2014-07-04 VITALS — BP 118/66 | HR 65 | Resp 12 | Ht 68.0 in | Wt 195.0 lb

## 2014-07-04 DIAGNOSIS — E890 Postprocedural hypothyroidism: Secondary | ICD-10-CM | POA: Diagnosis not present

## 2014-07-04 NOTE — Progress Notes (Signed)
Pre visit review using our clinic review tool, if applicable. No additional management support is needed unless otherwise documented below in the visit note. 

## 2014-07-04 NOTE — Assessment & Plan Note (Signed)
Last few thyroid levels suggest over-replacement with levothyroxine. She will most likely need a lower dose of LT4 now that she is off the estrace and as her age advances, her goal TSH levels could be relaxed to the mid normal range.   Recent TSH still at lower end of normal.  Appears clinically euthyroid today.  Asked her to decrease Levothyroxine to 100 mcg daily ( now taking ~107 mcg daily) . Now has been taking her medication as directed. She will follow up for labs in 2 months with her PCP- has lab appt.

## 2014-07-04 NOTE — Patient Instructions (Signed)
Lab Results  Component Value Date   TSH 0.47 07/01/2014   Your tests done recently show that thyroid levels are still slightly on higher side.  Decrease dose to levothyroxine 100 mcg daily.  I have sent in your new script to your pharmacy this week, if you dont get it, then please let us know.   Please come back for a follow-up appointment in 2 months.  Dr Deborra Medina

## 2014-07-04 NOTE — Progress Notes (Signed)
Patient ID: [name], female   DOB: [birth year], 74 y.o.   MRN: [id number]  Reason for visit: Hypothyroidism  HPI  [name] is a 74 y.o.-year-old female, for management for post-surgical hypothyroidism. Last visit April 2016.   she has been on Levothyroxine 125 mcg * 6 tablets weekly since past 2 months. Had labs done earlier this week.  Now taking the medication correctly, separate from the food. Hasn't missed any doses recently. She has been off Estrace since about Summer 2015. She has been doing well since last visit. Doesn't notice any major change in her health after the last dose decrease in Levothyroxine.    I reviewed pt's thyroid tests: TSH has been slowly downtrending 2015, but now stable. Free thyroid hormone levels normal range. Lab Results  Component Value Date   TSH 0.47 07/01/2014   TSH 0.42 04/30/2014   TSH 0.52 12/25/2013   TSH 0.27* 10/08/2013   TSH 0.26* 08/27/2013   TSH 0.47 04/16/2013   TSH 1.91 01/17/2012   TSH 2.23 08/25/2010   TSH 0.75 03/12/2004   TSH 0.174 07/20/2001   FREET4 0.83 07/01/2014   FREET4 0.96 04/30/2014   FREET4 1.07 12/25/2013   FREET4 0.92 10/08/2013   FREET4 1.00 08/27/2013   FREET4 0.76 01/17/2012    Lab Results  Component Value Date   T3FREE 3.0 10/08/2013     Review of systems: [ x  ] complains of    [  ] denies [  ] weight gain  [ x ] constipation-chronic  [  ] fatigue   [  ] dry skin  [  ] cold intolerance [  ] hair loss [  ] noticing any enlargement in size of thyroid [  ] lumps in neck [  ] dysphagia [  ] change in voice [  ]  SOB with lying down. X hot flashes after coming off estrace  I have reviewed the patient's past medical history, medications and allergies.   Current Outpatient Prescriptions on File Prior to Visit  Medication Sig Dispense Refill  . aspirin EC 81 MG tablet Take 81 mg by mouth daily.    [name] atenolol (TENORMIN) 50 MG tablet TAKE 1 TABLET BY MOUTH EVERY DAY 90 tablet 1  .  butalbital-aspirin-caffeine (FIORINAL) 50-325-40 MG per capsule Take 1 capsule by mouth 2 (two) times daily as needed for headache. 60 capsule 0  . Cholecalciferol (VITAMIN D-3) 1000 UNITS CAPS Take 1 capsule by mouth daily.    [name] estradiol (ESTRACE) 1 MG tablet Take 1 tablet (1 mg total) by mouth daily. 90 tablet 0  . ibuprofen (ADVIL,MOTRIN) 800 MG tablet TAKE 1 TABLET BY MOUTH EVERY 4 TO 6 HOURS AS NEEDED 180 tablet 1  . levothyroxine (SYNTHROID, LEVOTHROID) 100 MCG tablet Take 1 tablet (100 mcg total) by mouth daily before breakfast. 90 tablet 1  . meloxicam (MOBIC) 15 MG tablet Take 1 tablet (15 mg total) by mouth daily. With a meal as needed for back pain 30 tablet 0  . Multiple Vitamin (MULTIVITAMIN) tablet Take 1 tablet by mouth daily.      . mupirocin ointment (BACTROBAN) 2 % Apply topically 2 (two) times daily. 22 g 0  . omega-3 acid ethyl esters (LOVAZA) 1 G capsule Take 1 g by mouth daily.    . simvastatin (ZOCOR) 40 MG tablet TAKE 1 TABLET BY MOUTH AT BEDTIME 90 tablet 1  . SUMAtriptan (IMITREX) 20 MG/ACT nasal spray Place 1 spray into the nose as  needed.       No current facility-administered medications on file prior to visit.   Allergies  Allergen Reactions  . Codeine     REACTION: NAUSEA AND VOMITING  . Tramadol     Vomiting, LOC     PE: BP 118/66 mmHg  Pulse 65  Resp 12  Ht 5\' 8"  (1.727 m)  Wt 195 lb (88.451 kg)  BMI 29.66 kg/m2  SpO2 97% Wt Readings from Last 3 Encounters:  07/04/14 195 lb (88.451 kg)  06/26/14 195 lb 6.4 oz (88.633 kg)  05/15/14 192 lb 4 oz (87.204 kg)    HEENT: [place]/AT, EOMI, no icterus, no proptosis, no chemosis, no mild lid lag, no retraction, eyes close completely Neck: thyroid gland - not palpable, no tracheal deviation; negative Pemberton's sign; no lymphadenopathy, well healed surgical scar Lungs: good air entry, clear bilaterally Heart: S1&S2 normal, regular rate & rhythm; no murmurs, rubs or gallops Ext: no tremor in hands  bilaterally, no edema, 2+ DP/PT pulses, good muscle mass Neuro: normal gait, 2+ reflexes bilaterally, normal 5/5 strength, no proximal myopathy  Derm: no pretibial myxoedema/skin dryness   ASSESSMENT: 1. Post surgical Hypothyroidism, controlled   PLAN:  Problem List Items Addressed This Visit      Endocrine   Postsurgical hypothyroidism - Primary    Last few thyroid levels suggest over-replacement with levothyroxine. She will most likely need a lower dose of LT4 now that she is off the estrace and as her age advances, her goal TSH levels could be relaxed to the mid normal range.   Recent TSH still at lower end of normal.  Appears clinically euthyroid today.  Asked her to decrease Levothyroxine to 100 mcg daily ( now taking ~107 mcg daily) . Now has been taking her medication as directed. She will follow up for labs in 2 months with her PCP- has lab appt.                -RTC in 2 months Explained that [place] of [place], and she has elected to follow back with Dr Deborra Medina. Has lab appt for 2 months and follow up visit with her the week after.   Bynum Bellows [facility]  07/04/2014   12:56 PM

## 2014-08-22 DIAGNOSIS — N949 Unspecified condition associated with female genital organs and menstrual cycle: Secondary | ICD-10-CM | POA: Diagnosis not present

## 2014-08-26 ENCOUNTER — Other Ambulatory Visit (HOSPITAL_COMMUNITY): Payer: Self-pay | Admitting: Nurse Practitioner

## 2014-08-26 DIAGNOSIS — R1032 Left lower quadrant pain: Secondary | ICD-10-CM

## 2014-08-28 ENCOUNTER — Ambulatory Visit (HOSPITAL_COMMUNITY): Payer: Medicare Other

## 2014-08-29 ENCOUNTER — Other Ambulatory Visit: Payer: Self-pay | Admitting: Internal Medicine

## 2014-08-29 DIAGNOSIS — E785 Hyperlipidemia, unspecified: Secondary | ICD-10-CM

## 2014-08-29 DIAGNOSIS — C44712 Basal cell carcinoma of skin of right lower limb, including hip: Secondary | ICD-10-CM | POA: Diagnosis not present

## 2014-08-29 DIAGNOSIS — C44719 Basal cell carcinoma of skin of left lower limb, including hip: Secondary | ICD-10-CM | POA: Diagnosis not present

## 2014-08-29 DIAGNOSIS — I10 Essential (primary) hypertension: Secondary | ICD-10-CM

## 2014-08-29 DIAGNOSIS — L821 Other seborrheic keratosis: Secondary | ICD-10-CM | POA: Diagnosis not present

## 2014-08-29 DIAGNOSIS — E039 Hypothyroidism, unspecified: Secondary | ICD-10-CM

## 2014-08-29 DIAGNOSIS — L720 Epidermal cyst: Secondary | ICD-10-CM | POA: Diagnosis not present

## 2014-08-29 DIAGNOSIS — Z85828 Personal history of other malignant neoplasm of skin: Secondary | ICD-10-CM | POA: Diagnosis not present

## 2014-09-02 ENCOUNTER — Encounter: Payer: Self-pay | Admitting: Gastroenterology

## 2014-09-02 DIAGNOSIS — R1032 Left lower quadrant pain: Secondary | ICD-10-CM | POA: Diagnosis not present

## 2014-09-05 ENCOUNTER — Other Ambulatory Visit (INDEPENDENT_AMBULATORY_CARE_PROVIDER_SITE_OTHER): Payer: Medicare Other

## 2014-09-05 ENCOUNTER — Encounter: Payer: Self-pay | Admitting: Gastroenterology

## 2014-09-05 ENCOUNTER — Ambulatory Visit (INDEPENDENT_AMBULATORY_CARE_PROVIDER_SITE_OTHER): Payer: Medicare Other | Admitting: Gastroenterology

## 2014-09-05 VITALS — BP 130/56 | HR 68 | Ht 68.0 in | Wt 193.0 lb

## 2014-09-05 DIAGNOSIS — E039 Hypothyroidism, unspecified: Secondary | ICD-10-CM | POA: Diagnosis not present

## 2014-09-05 DIAGNOSIS — E785 Hyperlipidemia, unspecified: Secondary | ICD-10-CM | POA: Diagnosis not present

## 2014-09-05 DIAGNOSIS — R1032 Left lower quadrant pain: Secondary | ICD-10-CM

## 2014-09-05 DIAGNOSIS — I10 Essential (primary) hypertension: Secondary | ICD-10-CM

## 2014-09-05 DIAGNOSIS — R109 Unspecified abdominal pain: Secondary | ICD-10-CM

## 2014-09-05 LAB — CBC
HEMATOCRIT: 37.9 % (ref 36.0–46.0)
Hemoglobin: 12.9 g/dL (ref 12.0–15.0)
MCHC: 34 g/dL (ref 30.0–36.0)
MCV: 94.7 fl (ref 78.0–100.0)
Platelets: 246 10*3/uL (ref 150.0–400.0)
RBC: 4 Mil/uL (ref 3.87–5.11)
RDW: 13.2 % (ref 11.5–15.5)
WBC: 6 10*3/uL (ref 4.0–10.5)

## 2014-09-05 LAB — COMPREHENSIVE METABOLIC PANEL
ALBUMIN: 3.9 g/dL (ref 3.5–5.2)
ALT: 36 U/L — ABNORMAL HIGH (ref 0–35)
AST: 35 U/L (ref 0–37)
Alkaline Phosphatase: 92 U/L (ref 39–117)
BUN: 13 mg/dL (ref 6–23)
CO2: 28 meq/L (ref 19–32)
Calcium: 9.2 mg/dL (ref 8.4–10.5)
Chloride: 106 mEq/L (ref 96–112)
Creatinine, Ser: 0.71 mg/dL (ref 0.40–1.20)
GFR: 85.55 mL/min (ref >60.00)
Glucose, Bld: 108 mg/dL — ABNORMAL HIGH (ref 70–99)
POTASSIUM: 4.2 meq/L (ref 3.5–5.1)
SODIUM: 140 meq/L (ref 135–145)
Total Bilirubin: 0.4 mg/dL (ref 0.2–1.2)
Total Protein: 6.8 g/dL (ref 6.0–8.3)

## 2014-09-05 LAB — LIPID PANEL
CHOLESTEROL: 167 mg/dL (ref 0–200)
HDL: 42.8 mg/dL (ref >39.00)
LDL Cholesterol: 97 mg/dL (ref 0–99)
NonHDL: 123.93
Total CHOL/HDL Ratio: 4
Triglycerides: 135 mg/dL (ref 0.0–149.0)
VLDL: 27 mg/dL (ref 0.0–40.0)

## 2014-09-05 LAB — HEMOGLOBIN A1C: HEMOGLOBIN A1C: 5.9 % (ref 4.6–6.5)

## 2014-09-05 LAB — TSH: TSH: 1.29 u[IU]/mL (ref 0.35–4.50)

## 2014-09-05 LAB — T4, FREE: FREE T4: 0.83 ng/dL (ref 0.60–1.60)

## 2014-09-05 NOTE — Patient Instructions (Signed)
You have been scheduled for a CT scan of the abdomen and pelvis at [facility] ([address]---this is in the same building as Press [facility]).   You are scheduled on 09/11/14 at 10 am. You should arrive 15 minutes prior to your appointment time for registration. Please follow the written instructions below on the day of your exam:  WARNING: IF YOU ARE ALLERGIC TO IODINE/X-RAY DYE, PLEASE NOTIFY RADIOLOGY IMMEDIATELY AT [phone]! YOU WILL BE GIVEN A 13 HOUR PREMEDICATION PREP.  1) Do not eat or drink anything after 6 am (4 hours prior to your test) 2) You have been given 2 bottles of oral contrast to drink. The solution may taste better if refrigerated, but do NOT add ice or any other liquid to this solution. Shake well before drinking.    Drink 1 bottle of contrast @ 8 am (2 hours prior to your exam)  Drink 1 bottle of contrast @ 9 am (1 hour prior to your exam)  You may take any medications as prescribed with a small amount of water except for the following: Metformin, Glucophage, Glucovance, Avandamet, Riomet, Fortamet, Actoplus Met, Janumet, Glumetza or Metaglip. The above medications must be held the day of the exam AND 48 hours after the exam.  The purpose of you drinking the oral contrast is to aid in the visualization of your intestinal tract. The contrast solution may cause some diarrhea. Before your exam is started, you will be given a small amount of fluid to drink. Depending on your individual set of symptoms, you may also receive an intravenous injection of x-ray contrast/dye. Plan on being at [facility] for 30 minutes or long, depending on the type of exam you are having performed.  This test typically takes 30-45 minutes to complete.  If you have any questions regarding your exam or if you need to reschedule, you may call the CT department at [phone] between the hours of 8:00 am and 5:00 pm,  Monday-Friday.  ________________________________________________________________________

## 2014-09-06 ENCOUNTER — Encounter: Payer: Self-pay | Admitting: Gastroenterology

## 2014-09-06 DIAGNOSIS — R1032 Left lower quadrant pain: Secondary | ICD-10-CM | POA: Insufficient documentation

## 2014-09-06 DIAGNOSIS — R109 Unspecified abdominal pain: Secondary | ICD-10-CM | POA: Insufficient documentation

## 2014-09-06 DIAGNOSIS — R10A2 Flank pain, left side: Secondary | ICD-10-CM | POA: Insufficient documentation

## 2014-09-06 NOTE — Progress Notes (Signed)
09/06/2014 [name] [id number] [birth year]   HISTORY OF PRESENT ILLNESS:  This is a pleasant 74 year old female who is here today with her daughter at the request of her GYN for evaluation of left sided abdominal pain/flank pain.  She tells me that this has been present intermittently for the past year at least.  Pain is actually mostly in her flank and back.  No affected by eating or BM.  Has one BM daily, which is usually very soft or loose.  Denies blood in her stools.  No nausea, vomiting, fevers, etc  Pelvic ultrasound was unremarkable.  CBC, CMP, TSH were unremarkable/normal.    Colonoscopy 10/2010 by Dr. Fuller Plan was normal. Colonoscopy 08/2001 at [facility] was normal.   Past Medical History  Diagnosis Date  . Venous stasis ulcer 11/1997    Wound care center, [place]  . Hypothyroidism   . Hypercholesteremia   . Back pain   . Migraine   . Endometriosis    Past Surgical History  Procedure Laterality Date  . Abdominal hysterectomy      1 1/2 ovaries gone, second to endometriosis  . Appendectomy    . Thyroidectomy    . Hernia repair    . Rotator cuff repair    . Cystoscopy    . Hemorrhoid surgery      reports that she has never smoked. She does not have any smokeless tobacco history on file. She reports that she does not drink alcohol or use illicit drugs. family history includes Goiter in her mother; Heart attack in her brother and father; Heart disease in her father; Kidney disease in her brother; Stomach cancer in her sister; Stroke in her mother. There is no history of Colon cancer or Esophageal cancer. Allergies  Allergen Reactions  . Codeine     REACTION: NAUSEA AND VOMITING  . Tramadol     Vomiting, LOC      Outpatient Encounter Prescriptions as of 09/05/2014  Medication Sig  . aspirin EC 81 MG tablet Take 81 mg by mouth daily.  [name] atenolol (TENORMIN) 50 MG tablet TAKE 1 TABLET BY MOUTH EVERY DAY  . Cholecalciferol (VITAMIN D-3) 1000 UNITS CAPS Take 1  capsule by mouth daily.  [name] ibuprofen (ADVIL,MOTRIN) 800 MG tablet TAKE 1 TABLET BY MOUTH EVERY 4 TO 6 HOURS AS NEEDED  . levothyroxine (SYNTHROID, LEVOTHROID) 100 MCG tablet Take 1 tablet (100 mcg total) by mouth daily before breakfast.  . Multiple Vitamin (MULTIVITAMIN) tablet Take 1 tablet by mouth daily.    . simvastatin (ZOCOR) 40 MG tablet TAKE 1 TABLET BY MOUTH AT BEDTIME  . SUMAtriptan (IMITREX) 20 MG/ACT nasal spray Place 1 spray into the nose as needed.    . [DISCONTINUED] butalbital-aspirin-caffeine (FIORINAL) 50-325-40 MG per capsule Take 1 capsule by mouth 2 (two) times daily as needed for headache.  . [DISCONTINUED] estradiol (ESTRACE) 1 MG tablet Take 1 tablet (1 mg total) by mouth daily.  . [DISCONTINUED] meloxicam (MOBIC) 15 MG tablet Take 1 tablet (15 mg total) by mouth daily. With a meal as needed for back pain  . [DISCONTINUED] mupirocin ointment (BACTROBAN) 2 % Apply topically 2 (two) times daily.  . [DISCONTINUED] omega-3 acid ethyl esters (LOVAZA) 1 G capsule Take 1 g by mouth daily.   No facility-administered encounter medications on file as of 09/05/2014.     REVIEW OF SYSTEMS  : All other systems reviewed and negative except where noted in the History of Present Illness.  PHYSICAL EXAM: BP 130/56 mmHg  Pulse 68  Ht 5\' 8"  (1.727 m)  Wt 193 lb (87.544 kg)  BMI 29.35 kg/m2 General: Well developed white female in no acute distress Head: Normocephalic and atraumatic Eyes:  Sclerae anicteric, conjunctiva pink. Ears: Normal auditory acuity Lungs: Clear throughout to auscultation Heart: Regular rate and rhythm Abdomen: Soft, non-distended.  Normal bowel sounds.  Non-tender. Musculoskeletal: Symmetrical with no gross deformities  Skin: No lesions on visible extremities Extremities: No edema  Neurological: Alert oriented x 4, grossly non-focal Psychological:  Alert and cooperative. Normal mood and affect  ASSESSMENT AND PLAN: -L-sided abdominal pain/left flank  pain:  This has been intermittent for at least the past year.  Colonoscopy is up-to-date.  Will check a CT scan of the abdomen and pelvis to rule out intra-abdominal process, such as diverticular disease, etc.  If negative then would suspect that this is a musculoskeletal or nerve issue.  CC:  Lucille Passy, MD  CC: Laretta Alstrom, [facility]

## 2014-09-06 NOTE — Progress Notes (Signed)
Reviewed and agree with management plan.  Blessyn [place] T. Shawon Denzer, MD FACG 

## 2014-09-09 ENCOUNTER — Ambulatory Visit (INDEPENDENT_AMBULATORY_CARE_PROVIDER_SITE_OTHER): Payer: Medicare Other | Admitting: Family Medicine

## 2014-09-09 ENCOUNTER — Encounter: Payer: Self-pay | Admitting: Family Medicine

## 2014-09-09 VITALS — BP 128/70 | HR 86 | Temp 97.7°F | Ht 67.75 in | Wt 194.5 lb

## 2014-09-09 DIAGNOSIS — L299 Pruritus, unspecified: Secondary | ICD-10-CM | POA: Diagnosis not present

## 2014-09-09 DIAGNOSIS — Z0000 Encounter for general adult medical examination without abnormal findings: Secondary | ICD-10-CM | POA: Diagnosis not present

## 2014-09-09 DIAGNOSIS — E890 Postprocedural hypothyroidism: Secondary | ICD-10-CM | POA: Diagnosis not present

## 2014-09-09 DIAGNOSIS — E785 Hyperlipidemia, unspecified: Secondary | ICD-10-CM | POA: Diagnosis not present

## 2014-09-09 DIAGNOSIS — I10 Essential (primary) hypertension: Secondary | ICD-10-CM

## 2014-09-09 DIAGNOSIS — Z23 Encounter for immunization: Secondary | ICD-10-CM

## 2014-09-09 DIAGNOSIS — M545 Low back pain, unspecified: Secondary | ICD-10-CM | POA: Insufficient documentation

## 2014-09-09 MED ORDER — FLUOCINONIDE-E 0.05 % EX CREA: 1.0000 "application " | TOPICAL_CREAM | Freq: Two times a day (BID) | CUTANEOUS | Status: DC

## 2014-09-09 MED ORDER — ATENOLOL 50 MG PO TABS: 50.0000 mg | ORAL_TABLET | Freq: Every day | ORAL | Status: DC

## 2014-09-09 MED ORDER — SIMVASTATIN 40 MG PO TABS: ORAL_TABLET | ORAL | Status: DC

## 2014-09-09 NOTE — Assessment & Plan Note (Signed)
Followed by endocrinology.  Euthyroid clinically at this point.

## 2014-09-09 NOTE — Assessment & Plan Note (Signed)
The patients weight, height, BMI and visual acuity have been recorded in the chart I have made referrals, counseling and provided education to the patient based review of the above and I have provided the pt with a written personalized care plan for preventive services.  Influenza vaccine given today. 

## 2014-09-09 NOTE — Assessment & Plan Note (Signed)
Well controlled on current dose of zocor. No changes made today.

## 2014-09-09 NOTE — Progress Notes (Addendum)
Subjective:    Patient ID: [name], female    DOB: [birth year], 74 y.o.   MRN: [id number]  HPI  74 yo very pleasant female with h/o HLD, HTN, hypothyroidism, venous insufficiency here medicare wellness visit, follow up of chronic medical conditions and to discuss itching on her back.  I have personally reviewed the Medicare Annual Wellness questionnaire and have noted 1. The patient's medical and social history 2. Their use of alcohol, tobacco or illicit drugs 3. Their current medications and supplements 4. The patient's functional ability including ADL's, fall risks, home safety risks and hearing or visual             impairment. 5. Diet and physical activities 6. Evidence for depression or mood disorders  End of life wishes discussed and updated in Social History.  The roster of all physicians providing medical care to patient - is listed in the Snapshot section of the chart.  Pneumovax 08/15/06 Prevnar 13 09/03/13 Tdap 01/18/13 Colonoscopy [facility])- 10/20/10- 10 year recall Mammogram 03/26/13 Zostavax at [facility] 2 years ago per pt Eye exam 08/2013- Dr. [name] Burly  Back has been itching a few days.  Has been having intermittent low back pain.  Scheduled to have a CT of abdomen and pelvis on Wednesday of this week. Saw Janett Billow Zehr last week- note reviewed.  HLD-  on zocor 40 mg daily.   Lab Results  Component Value Date   CHOL 167 09/05/2014   HDL 42.80 09/05/2014   LDLCALC 97 09/05/2014   LDLDIRECT 85.2 01/17/2012   TRIG 135.0 09/05/2014   CHOLHDL 4 09/05/2014    Hypothyroidism- on synthroid 100 mcg daily. Followed by Dr. Howell Rucks.  Last saw her on 07/04/14- note reviewed.    TSH low but FT4 normal.  Denies any symptoms of hyperthyroidism. No diarrhea, no palpitations, no CP, no tremors, no SOB.  Lab Results  Component Value Date   TSH 1.29 09/05/2014    HTN- stable on current dose of Atenolol.   Lab Results  Component Value Date   CREATININE 0.71  09/05/2014   Lab Results  Component Value Date   ALT 36* 09/05/2014   AST 35 09/05/2014   ALKPHOS 92 09/05/2014   BILITOT 0.4 09/05/2014   Lab Results  Component Value Date   NA 140 09/05/2014   K 4.2 09/05/2014   CL 106 09/05/2014   CO2 28 09/05/2014    Patient Active Problem List   Diagnosis Date Noted  . LLQ abdominal pain 09/06/2014  . Left flank pain 09/06/2014  . Left low back pain 02/27/2014  . Medicare annual wellness visit, subsequent 09/03/2013  . Routine general medical examination at a health care facility 09/03/2013  . HLD (hyperlipidemia) 04/16/2013  . HTN (hypertension) 05/08/2012  . Post menopausal syndrome 01/27/2012  . 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 UNSPECIFIED VENOUS INSUFFICIENCY 09/08/2007   Past Medical History  Diagnosis Date  . Venous stasis ulcer 11/1997    [facility]ound care center, [place]  . Hypothyroidism   . Hypercholesteremia   . Back pain   . Migraine   . Endometriosis    Past Surgical History  Procedure Laterality Date  . Abdominal hysterectomy      1 1/2 ovaries gone, second to endometriosis  . Appendectomy    . Thyroidectomy    . Hernia repair    . Rotator cuff repair    . Cystoscopy    . Hemorrhoid surgery     Social History  Substance Use Topics  .  Smoking status: Never Smoker   . Smokeless tobacco: None  . Alcohol Use: No   Family History  Problem Relation Age of Onset  . Stroke Mother   . Goiter Mother   . Stomach cancer Sister   . Heart attack Father   . Heart disease Father   . Heart attack Brother   . Kidney disease Brother   . Colon cancer Neg Hx   . Esophageal cancer Neg Hx    Allergies  Allergen Reactions  . Codeine     REACTION: NAUSEA AND VOMITING  . Tramadol     Vomiting, LOC   Current Outpatient Prescriptions on File Prior to Visit  Medication Sig Dispense Refill  . aspirin EC 81 MG tablet Take 81 mg by mouth daily.    . Cholecalciferol (VITAMIN D-3) 1000 UNITS CAPS Take 1 capsule by  mouth daily.    [name] ibuprofen (ADVIL,MOTRIN) 800 MG tablet TAKE 1 TABLET BY MOUTH EVERY 4 TO 6 HOURS AS NEEDED 180 tablet 1  . levothyroxine (SYNTHROID, LEVOTHROID) 100 MCG tablet Take 1 tablet (100 mcg total) by mouth daily before breakfast. 90 tablet 1  . Multiple Vitamin (MULTIVITAMIN) tablet Take 1 tablet by mouth daily.      . SUMAtriptan (IMITREX) 20 MG/ACT nasal spray Place 1 spray into the nose as needed.       No current facility-administered medications on file prior to visit.   The PMH, PSH, Social History, Family History, Medications, and allergies have been reviewed in [facility], and have been updated if relevant.      Review of Systems  Constitutional: Negative.   HENT: Negative.   Eyes: Negative.   Respiratory: Negative.   Cardiovascular: Negative.  Negative for chest pain.  Gastrointestinal: Negative.   Endocrine: Negative.   Genitourinary: Negative.   Musculoskeletal: Negative.   Skin: Positive for rash.  Allergic/Immunologic: Negative.   Neurological: Negative.   Hematological: Negative.   Psychiatric/Behavioral: Negative.   All other systems reviewed and are negative.      Objective:   Physical Exam BP 128/70 mmHg  Pulse 86  Temp(Src) 97.7 F (36.5 C) (Oral)  Ht 5' 7.75" (1.721 m)  Wt 194 lb 8 oz (88.225 kg)  BMI 29.79 kg/m2  SpO2 98%    General:  Well-developed,well-nourished,in no acute distress; alert,appropriate and cooperative throughout examination Head:  normocephalic and atraumatic.   Eyes:  vision grossly intact, pupils equal, pupils round, and pupils reactive to light.   Ears:  R ear normal and L ear normal.   Nose:  no external deformity.   Mouth:  good dentition.   Neck:  No deformities, masses, or tenderness noted. Breasts:  No mass, nodules, thickening, tenderness, bulging, retraction, inflamation, nipple discharge or skin changes noted.   Lungs:  Normal respiratory effort, chest expands symmetrically. Lungs are clear to auscultation, no  crackles or wheezes. Heart:  Normal rate and regular rhythm. S1 and S2 normal without gallop, murmur, click, rub or other extra sounds. Abdomen:  Bowel sounds positive,abdomen soft and non-tender without masses, organomegaly or hernias noted. Msk:  No deformity or scoliosis noted of thoracic or lumbar spine.   Extremities:  No clubbing, cyanosis, edema, or deformity noted with normal full range of motion of all joints.   Neurologic:  alert & oriented X3 and gait normal.   Skin:  Intact without rashes No rash on back- + excoriation marks and ? One bug bite Cervical Nodes:  No lymphadenopathy noted Axillary Nodes:  No palpable lymphadenopathy  Psych:  Cognition and judgment appear intact. Alert and cooperative with normal attention span and concentration. No apparent delusions, illusions, hallucinations     Assessment & Plan:

## 2014-09-09 NOTE — Assessment & Plan Note (Signed)
Awaiting results of CT later this week.

## 2014-09-09 NOTE — Progress Notes (Signed)
Pre visit review using our clinic review tool, if applicable. No additional management support is needed unless otherwise documented below in the visit note. 

## 2014-09-09 NOTE — Assessment & Plan Note (Signed)
Well controlled on current dose of Atenolol.  No changes made today.

## 2014-09-09 NOTE — Addendum Note (Signed)
Addended by: Lucille Passy on: 09/09/2014 09:06 AM   Modules accepted: Miquel Dunn

## 2014-09-09 NOTE — Addendum Note (Signed)
Addended by: Modena Nunnery on: 09/09/2014 09:23 AM   Modules accepted: Orders

## 2014-09-09 NOTE — Assessment & Plan Note (Signed)
New- ? Insect bites Given rx for lidex cream to use prn and advised OTC antihistamine prn. Call or return to clinic prn if these symptoms worsen or fail to improve as anticipated. The patient indicates understanding of these issues and agrees with the plan.

## 2014-09-09 NOTE — Patient Instructions (Signed)
Great to see you. Try taking a zyrtec daily for the itching.  Use the lidex as needed for itching.  Keep me updated after your scan.

## 2014-09-11 ENCOUNTER — Ambulatory Visit (INDEPENDENT_AMBULATORY_CARE_PROVIDER_SITE_OTHER)
Admission: RE | Admit: 2014-09-11 | Discharge: 2014-09-11 | Disposition: A | Discharge status: Home / Self Care | Payer: Medicare Other | Source: Ambulatory Visit | Attending: Gastroenterology | Admitting: Gastroenterology

## 2014-09-11 DIAGNOSIS — R1032 Left lower quadrant pain: Secondary | ICD-10-CM

## 2014-09-11 DIAGNOSIS — N281 Cyst of kidney, acquired: Secondary | ICD-10-CM | POA: Diagnosis not present

## 2014-09-11 DIAGNOSIS — R109 Unspecified abdominal pain: Secondary | ICD-10-CM | POA: Diagnosis not present

## 2014-09-15 ENCOUNTER — Encounter: Payer: Self-pay | Admitting: Family Medicine

## 2014-09-17 ENCOUNTER — Encounter: Payer: Self-pay | Admitting: Family Medicine

## 2014-09-17 ENCOUNTER — Ambulatory Visit (INDEPENDENT_AMBULATORY_CARE_PROVIDER_SITE_OTHER): Payer: Medicare Other | Admitting: Family Medicine

## 2014-09-17 VITALS — BP 132/72 | HR 64 | Temp 97.5°F | Wt 196.0 lb

## 2014-09-17 DIAGNOSIS — M4120 Other idiopathic scoliosis, site unspecified: Secondary | ICD-10-CM

## 2014-09-17 DIAGNOSIS — M479 Spondylosis, unspecified: Secondary | ICD-10-CM | POA: Insufficient documentation

## 2014-09-17 DIAGNOSIS — M47895 Other spondylosis, thoracolumbar region: Secondary | ICD-10-CM

## 2014-09-17 NOTE — Patient Instructions (Signed)
Great to see you. Try Tylenol extra strength or arthritis- twice daily- follow directions on the bottle.  You can also take Alleve- up to 2 tablets twice daily as needed for breakthrough with food.  Please keep me updated.

## 2014-09-17 NOTE — Assessment & Plan Note (Signed)
>  15 minutes spent in face to face time with patient, >50% spent in counselling or coordination of care. Advised to d/c daily Alleve- schedule Tylenol ES twice daily and use alleve as needed only for breakthrough pain and with food. See AVS. Call or return to clinic prn if these symptoms worsen or fail to improve as anticipated. The patient indicates understanding of these issues and agrees with the plan.

## 2014-09-17 NOTE — Progress Notes (Signed)
Subjective:   Patient ID: [name], female    DOB: [birth year], 74 y.o.   MRN: [id number]  [name] is a pleasant 74 y.o. year old female who presents to clinic today with Follow-up  on 09/17/2014  HPI:  Here to discuss CT results ordered by GI.  GI source of her back pain ruled out.  Did show multilevel spondylosis and scoliosis which could certainly explain some of her symptoms.  Alleve has helped with her pain- taking this twice daily.  Lab Results  Component Value Date   CREATININE 0.71 09/05/2014     Ct Abdomen Pelvis W Contrast  09/11/2014   CLINICAL DATA:  Left lower quadrant abdominal and back pain for 6 months. No known injury history appendectomy, hysterectomy and hernia repair. Initial encounter.  EXAM: CT ABDOMEN AND PELVIS WITH CONTRAST  TECHNIQUE: Multidetector CT imaging of the abdomen and pelvis was performed using the standard protocol following bolus administration of intravenous contrast.  CONTRAST:  100 ml Omnipaque 300.  COMPARISON:  None.  FINDINGS: Lower chest: Clear lung bases. No significant pleural or pericardial effusion.  Hepatobiliary: Suspected mild steatosis. No focal hepatic abnormalities demonstrated. No evidence of gallstones, gallbladder wall thickening or biliary dilatation.  Pancreas: Unremarkable. No pancreatic ductal dilatation or surrounding inflammatory changes.  Spleen: Normal in size without focal abnormality. Small accessory splenule.  Adrenals/Urinary Tract: Both adrenal glands appear normal. Small left renal cysts, measuring up to 1.6 cm in the upper pole. No evidence of enhancing renal mass or hydronephrosis. No evidence of urinary tract calculus or hydronephrosis. The bladder appears normal.  Stomach/Bowel: No evidence of bowel wall thickening, distention or surrounding inflammatory change. Moderate stool in the distal colon.  Vascular/Lymphatic: There are no enlarged abdominal or pelvic lymph nodes. Mild atherosclerosis of the  aorta, its branches and the iliac arteries.  Reproductive: Status post hysterectomy. Probable residual ovarian tissue on the left (image 60). No evidence of adnexal mass.  Other: Postsurgical changes in the left lower quadrant abdominal wall consistent with hernia repair. No recurrent hernia.  Musculoskeletal: No acute or significant osseous findings. There is multilevel spondylosis associated with a convex right thoracolumbar scoliosis.  IMPRESSION: 1. No acute findings or explanation for the patient's symptoms. No inflammatory changes identified within the abdomen. 2. Postsurgical changes from previous left inguinal hernia repair. No evidence of recurrent hernia. 3. Multilevel spondylosis and a thoracolumbar scoliosis may certainly contribute to back pain. 4. Mild atherosclerosis.   Electronically Signed   By: Richardean Sale M.D.   On: 09/11/2014 11:22   Current Outpatient Prescriptions on File Prior to Visit  Medication Sig Dispense Refill  . aspirin EC 81 MG tablet Take 81 mg by mouth daily.    [name] atenolol (TENORMIN) 50 MG tablet Take 1 tablet (50 mg total) by mouth daily. 90 tablet 3  . Cholecalciferol (VITAMIN D-3) 1000 UNITS CAPS Take 1 capsule by mouth daily.    . fluocinonide-emollient (LIDEX-E) 0.05 % cream Apply 1 application topically 2 (two) times daily. 30 g 0  . ibuprofen (ADVIL,MOTRIN) 800 MG tablet TAKE 1 TABLET BY MOUTH EVERY 4 TO 6 HOURS AS NEEDED 180 tablet 1  . levothyroxine (SYNTHROID, LEVOTHROID) 100 MCG tablet Take 1 tablet (100 mcg total) by mouth daily before breakfast. 90 tablet 1  . Multiple Vitamin (MULTIVITAMIN) tablet Take 1 tablet by mouth daily.      . simvastatin (ZOCOR) 40 MG tablet TAKE 1 TABLET BY MOUTH AT BEDTIME 90 tablet 1  .  SUMAtriptan (IMITREX) 20 MG/ACT nasal spray Place 1 spray into the nose as needed.       No current facility-administered medications on file prior to visit.    Allergies  Allergen Reactions  . Codeine     REACTION: NAUSEA AND VOMITING   . Tramadol     Vomiting, LOC    Past Medical History  Diagnosis Date  . Venous stasis ulcer 11/1997    Wound care center, [place]  . Hypothyroidism   . Hypercholesteremia   . Back pain   . Migraine   . Endometriosis     Past Surgical History  Procedure Laterality Date  . Abdominal hysterectomy      1 1/2 ovaries gone, second to endometriosis  . Appendectomy    . Thyroidectomy    . Hernia repair    . Rotator cuff repair    . Cystoscopy    . Hemorrhoid surgery      Family History  Problem Relation Age of Onset  . Stroke Mother   . Goiter Mother   . Stomach cancer Sister   . Heart attack Father   . Heart disease Father   . Heart attack Brother   . Kidney disease Brother   . Colon cancer Neg Hx   . Esophageal cancer Neg Hx     Social History   Social History  . Marital Status: Married    Spouse Name: N/A  . Number of Children: 2  . Years of Education: N/A   Occupational History  . Retired    Social History Main Topics  . Smoking status: Never Smoker   . Smokeless tobacco: Not on file  . Alcohol Use: No  . Drug Use: No  . Sexual Activity: Not on file   Other Topics Concern  . Not on file   Social History Narrative   Lives with husband   Does not have living will.   Would desire CPR but would not want to be on life support for prolonged period of time if futile.         The PMH, PSH, Social History, Family History, Medications, and allergies have been reviewed in [facility], and have been updated if relevant.   Review of Systems  Constitutional: Negative.   Gastrointestinal: Negative.   Genitourinary: Negative.   Musculoskeletal: Positive for back pain. Negative for myalgias, joint swelling, gait problem, neck pain and neck stiffness.  All other systems reviewed and are negative.      Objective:    BP 132/72 mmHg  Pulse 64  Temp(Src) 97.5 F (36.4 C) (Oral)  Wt 196 lb (88.905 kg)  SpO2 97%   Physical Exam  Constitutional: She is  oriented to person, place, and time. She appears well-developed and well-nourished. No distress.  HENT:  Head: Normocephalic.  Eyes: Conjunctivae are normal.  Cardiovascular: Normal rate.   Pulmonary/Chest: Effort normal.  Musculoskeletal: Normal range of motion. She exhibits no edema.  Neurological: She is alert and oriented to person, place, and time. No cranial nerve deficit.  Skin: Skin is warm and dry.  Psychiatric: She has a normal mood and affect. Her behavior is normal. Thought content normal.  Nursing note and vitals reviewed.         Assessment & Plan:   Other osteoarthritis of spine, thoracolumbar region  Idiopathic scoliosis No Follow-up on file.

## 2014-09-17 NOTE — Progress Notes (Signed)
Pre visit review using our clinic review tool, if applicable. No additional management support is needed unless otherwise documented below in the visit note. 

## 2014-09-27 ENCOUNTER — Other Ambulatory Visit: Payer: Self-pay | Admitting: Family Medicine

## 2014-09-27 NOTE — Telephone Encounter (Signed)
Last filled 09/2013--last OV 09/17/2014 for acute visit--please advise

## 2014-09-30 NOTE — Telephone Encounter (Signed)
Called into [facility] [address] 

## 2014-10-18 DIAGNOSIS — H3530 Unspecified macular degeneration: Secondary | ICD-10-CM | POA: Diagnosis not present

## 2014-10-18 DIAGNOSIS — H524 Presbyopia: Secondary | ICD-10-CM | POA: Diagnosis not present

## 2014-10-18 DIAGNOSIS — H5203 Hypermetropia, bilateral: Secondary | ICD-10-CM | POA: Diagnosis not present

## 2014-10-18 DIAGNOSIS — H52223 Regular astigmatism, bilateral: Secondary | ICD-10-CM | POA: Diagnosis not present

## 2014-10-30 ENCOUNTER — Ambulatory Visit: Payer: Medicare Other | Admitting: Endocrinology

## 2014-11-14 ENCOUNTER — Ambulatory Visit (INDEPENDENT_AMBULATORY_CARE_PROVIDER_SITE_OTHER): Payer: Medicare Other | Admitting: Internal Medicine

## 2014-11-14 ENCOUNTER — Encounter: Payer: Self-pay | Admitting: Internal Medicine

## 2014-11-14 VITALS — BP 136/74 | HR 76 | Temp 97.6°F | Wt 196.0 lb

## 2014-11-14 DIAGNOSIS — J069 Acute upper respiratory infection, unspecified: Secondary | ICD-10-CM

## 2014-11-14 MED ORDER — AZITHROMYCIN 250 MG PO TABS: ORAL_TABLET | ORAL | Status: DC

## 2014-11-14 NOTE — Progress Notes (Signed)
HPI  Pt presents to the clinic today with c/o cough and chest congestion. This started 3 days ago. The cough is productive of green mucous. She denies runny nose, nasal congestion, sore throat or shortness of breath. She denies fever, chills or body aches. She has tried Aleve, Tylenol, and Delsym without any relief. She has no history of allergies or breathing problems. She has not had sick contacts that she is aware of.  Review of Systems      Past Medical History  Diagnosis Date  . Venous stasis ulcer ([place]) 11/1997    Wound care center, [place]  . Hypothyroidism   . Hypercholesteremia   . Back pain   . Migraine   . Endometriosis     Family History  Problem Relation Age of Onset  . Stroke Mother   . Goiter Mother   . Stomach cancer Sister   . Heart attack Father   . Heart disease Father   . Heart attack Brother   . Kidney disease Brother   . Colon cancer Neg Hx   . Esophageal cancer Neg Hx     Social History   Social History  . Marital Status: Married    Spouse Name: N/A  . Number of Children: 2  . Years of Education: N/A   Occupational History  . Retired    Social History Main Topics  . Smoking status: Never Smoker   . Smokeless tobacco: Not on file  . Alcohol Use: No  . Drug Use: No  . Sexual Activity: Not on file   Other Topics Concern  . Not on file   Social History Narrative   Lives with husband   Does not have living will.   Would desire CPR but would not want to be on life support for prolonged period of time if futile.          Allergies  Allergen Reactions  . Codeine     REACTION: NAUSEA AND VOMITING  . Tramadol     Vomiting, LOC     Constitutional:  Denies headache, fatigue, fever or abrupt weight changes.  HEENT:  Denies eye redness, eye pain, pressure behind the eyes, facial pain, nasal congestion, ear pain, ringing in the ears, wax buildup, runny nose or sore throat. Respiratory: Positive cough. Denies difficulty breathing or  shortness of breath.  Cardiovascular: Denies chest pain, chest tightness, palpitations or swelling in the hands or feet.   No other specific complaints in a complete review of systems (except as listed in HPI above).  Objective:   BP 136/74 mmHg  Pulse 76  Temp(Src) 97.6 F (36.4 C) (Oral)  Wt 196 lb (88.905 kg)  SpO2 98% Wt Readings from Last 3 Encounters:  11/14/14 196 lb (88.905 kg)  09/17/14 196 lb (88.905 kg)  09/09/14 194 lb 8 oz (88.225 kg)     General: Appears her stated age, ill appearing in NAD. HEENT: Head: normal shape and size; Eyes: sclera white, no icterus, conjunctiva pink; Ears: Tm's gray and intact, normal light reflex;  Throat/Mouth: Teeth present, mucosa erythematous and moist, no exudate noted, no lesions or ulcerations noted.  Neck: No cervical lymphadenopathy.  Cardiovascular: Normal rate and rhythm. S1,S2 noted.  No murmur, rubs or gallops noted.  Pulmonary/Chest: Normal effort and positive vesicular breath sounds. No respiratory distress. No wheezes, rales or ronchi noted.      Assessment & Plan:   Upper Respiratory Infection:  Could be viral but she looks ill Get some rest  and drink plenty of water eRx for Azithromax x 5 days Continue Delsym for cough  RTC as needed or if symptoms persist.

## 2014-11-14 NOTE — Progress Notes (Signed)
Pre visit review using our clinic review tool, if applicable. No additional management support is needed unless otherwise documented below in the visit note. 

## 2014-11-14 NOTE — Patient Instructions (Signed)
Upper Respiratory Infection, Adult Most upper respiratory infections (URIs) are a viral infection of the air passages leading to the lungs. A URI affects the nose, throat, and upper air passages. The most common type of URI is nasopharyngitis and is typically referred to as "the common cold." URIs run their course and usually go away on their own. Most of the time, a URI does not require medical attention, but sometimes a bacterial infection in the upper airways can follow a viral infection. This is called a secondary infection. Sinus and middle ear infections are common types of secondary upper respiratory infections. Bacterial pneumonia can also complicate a URI. A URI can worsen asthma and chronic obstructive pulmonary disease (COPD). Sometimes, these complications can require emergency medical care and may be life threatening.  CAUSES Almost all URIs are caused by viruses. A virus is a type of germ and can spread from one person to another.  RISKS FACTORS You may be at risk for a URI if:   You smoke.   You have chronic heart or lung disease.  You have a weakened defense (immune) system.   You are very young or very old.   You have nasal allergies or asthma.  You work in crowded or poorly ventilated areas.  You work in health care facilities or schools. SIGNS AND SYMPTOMS  Symptoms typically develop 2-3 days after you come in contact with a cold virus. Most viral URIs last 7-10 days. However, viral URIs from the influenza virus (flu virus) can last 14-18 days and are typically more severe. Symptoms may include:   Runny or stuffy (congested) nose.   Sneezing.   Cough.   Sore throat.   Headache.   Fatigue.   Fever.   Loss of appetite.   Pain in your forehead, behind your eyes, and over your cheekbones (sinus pain).  Muscle aches.  DIAGNOSIS  Your health care provider may diagnose a URI by:  Physical exam.  Tests to check that your symptoms are not due to  another condition such as:  Strep throat.  Sinusitis.  Pneumonia.  Asthma. TREATMENT  A URI goes away on its own with time. It cannot be cured with medicines, but medicines may be prescribed or recommended to relieve symptoms. Medicines may help:  Reduce your fever.  Reduce your cough.  Relieve nasal congestion. HOME CARE INSTRUCTIONS   Take medicines only as directed by your health care provider.   Gargle warm saltwater or take cough drops to comfort your throat as directed by your health care provider.  Use a warm mist humidifier or inhale steam from a shower to increase air moisture. This may make it easier to breathe.  Drink enough fluid to keep your urine clear or pale yellow.   Eat soups and other clear broths and maintain good nutrition.   Rest as needed.   Return to work when your temperature has returned to normal or as your health care provider advises. You may need to stay home longer to avoid infecting others. You can also use a face mask and careful hand washing to prevent spread of the virus.  Increase the usage of your inhaler if you have asthma.   Do not use any tobacco products, including cigarettes, chewing tobacco, or electronic cigarettes. If you need help quitting, ask your health care provider. PREVENTION  The best way to protect yourself from getting a cold is to practice good hygiene.   Avoid oral or hand contact with people with cold   symptoms.   Wash your hands often if contact occurs.  There is no clear evidence that vitamin C, vitamin E, echinacea, or exercise reduces the chance of developing a cold. However, it is always recommended to get plenty of rest, exercise, and practice good nutrition.  SEEK MEDICAL CARE IF:   You are getting worse rather than better.   Your symptoms are not controlled by medicine.   You have chills.  You have worsening shortness of breath.  You have brown or red mucus.  You have yellow or brown nasal  discharge.  You have pain in your face, especially when you bend forward.  You have a fever.  You have swollen neck glands.  You have pain while swallowing.  You have white areas in the back of your throat. SEEK IMMEDIATE MEDICAL CARE IF:   You have severe or persistent:  Headache.  Ear pain.  Sinus pain.  Chest pain.  You have chronic lung disease and any of the following:  Wheezing.  Prolonged cough.  Coughing up blood.  A change in your usual mucus.  You have a stiff neck.  You have changes in your:  Vision.  Hearing.  Thinking.  Mood. MAKE SURE YOU:   Understand these instructions.  Will watch your condition.  Will get help right away if you are not doing well or get worse.   This information is not intended to replace advice given to you by your health care provider. Make sure you discuss any questions you have with your health care provider.   Document Released: 06/30/2000 Document Revised: 05/21/2014 Document Reviewed: 04/11/2013 Elsevier Interactive Patient Education 2016 [employer]  

## 2014-12-18 ENCOUNTER — Other Ambulatory Visit: Payer: Self-pay | Admitting: Endocrinology

## 2014-12-23 ENCOUNTER — Other Ambulatory Visit: Payer: Self-pay

## 2014-12-23 NOTE — Telephone Encounter (Signed)
Pt request refill levothyroxine 100 mcg; pt is out of med; pt annual exam 09/09/14 Dr Deborra Medina noted pt followed by Dr Howell Rucks; TSH is low but FT4 normal. Pt no longer sees Dr Howell Rucks.Please advise. Pt request cb when refilled. [facility].

## 2014-12-24 MED ORDER — LEVOTHYROXINE SODIUM 100 MCG PO TABS: 100.0000 ug | ORAL_TABLET | Freq: Every day | ORAL | Status: DC

## 2015-02-06 ENCOUNTER — Other Ambulatory Visit: Payer: Self-pay | Admitting: *Deleted

## 2015-02-06 MED ORDER — BUTALBITAL-APAP-CAFFEINE 50-325-40 MG PO TABS
ORAL_TABLET | ORAL | Status: DC
Start: 2015-02-06 — End: 2017-09-13

## 2015-02-06 NOTE — Telephone Encounter (Signed)
Rx called in to requested pharmacy 

## 2015-02-06 NOTE — Telephone Encounter (Signed)
Last f/u 08/2014-CPE 

## 2015-03-05 DIAGNOSIS — Z85828 Personal history of other malignant neoplasm of skin: Secondary | ICD-10-CM | POA: Diagnosis not present

## 2015-03-05 DIAGNOSIS — L821 Other seborrheic keratosis: Secondary | ICD-10-CM | POA: Diagnosis not present

## 2015-04-09 ENCOUNTER — Telehealth: Payer: Self-pay | Admitting: Family Medicine

## 2015-04-09 NOTE — Telephone Encounter (Signed)
Pt has appt with DR Deborra Medina on 04/10/15 at 8:30.

## 2015-04-09 NOTE — Telephone Encounter (Signed)
Patient Name: [name]  DOB: 1940/05/07    Initial Comment caller states she has a sore throat   Nurse Assessment  Nurse: Mallie Mussel, RN, Alveta Heimlich Date/Time Eilene Ghazi Time): 04/09/2015 8:24:27 AM  Confirm and document reason for call. If symptomatic, describe symptoms. You must click the next button to save text entered. ---Caller states she has a sore throat which began yesterday. She rates her pain as 4-5 on 0-10 scale. She denies difficulty breathing. She denies fever. She also has "a little bit of a cough." She states that a Zpk usually clears this up if she can get started on it.  Has the patient traveled out of the country within the last 30 days? ---No  Does the patient have any new or worsening symptoms? ---Yes  Will a triage be completed? ---Yes  Related visit to physician within the last 2 weeks? ---No  Does the PT have any chronic conditions? (i.e. diabetes, asthma, etc.) ---Yes  List chronic conditions. ---HTN, Hypercholesterolemia, Hypothyroidism  Is this a behavioral health or substance abuse call? ---No     Guidelines    Guideline Title Affirmed Question Affirmed Notes  Sore Throat [1] Sore throat with cough/cold symptoms AND [2] present < 5 days (all triage questions negative)    Final Disposition User   [place], RN, Alveta Heimlich    Comments  Caller is wanting to be seen so she can get a Zpk ordered. I offered to try to set her up an appointmdnt for today or tomorrow. She states she already has appointments for today and tomorrow. She will need to check the times on them and call back. She states she will call back in a few minutes.   Disagree/Comply: Disagree  Disagree/Comply Reason: Disagree with instructions

## 2015-04-11 ENCOUNTER — Encounter: Payer: Self-pay | Admitting: Internal Medicine

## 2015-04-11 ENCOUNTER — Ambulatory Visit (INDEPENDENT_AMBULATORY_CARE_PROVIDER_SITE_OTHER): Payer: Medicare Other | Admitting: Internal Medicine

## 2015-04-11 ENCOUNTER — Ambulatory Visit: Payer: Medicare Other | Admitting: Internal Medicine

## 2015-04-11 VITALS — BP 122/66 | HR 76 | Temp 97.8°F | Wt 195.5 lb

## 2015-04-11 DIAGNOSIS — J069 Acute upper respiratory infection, unspecified: Secondary | ICD-10-CM

## 2015-04-11 DIAGNOSIS — B9789 Other viral agents as the cause of diseases classified elsewhere: Principal | ICD-10-CM

## 2015-04-11 MED ORDER — BENZONATATE 200 MG PO CAPS: 200.0000 mg | ORAL_CAPSULE | Freq: Two times a day (BID) | ORAL | Status: DC | PRN

## 2015-04-11 NOTE — Patient Instructions (Signed)

## 2015-04-11 NOTE — Progress Notes (Signed)
HPI  Pt presents to the clinic today with c/o runny nose, sore throat and cough. This started 2 days ago. She is blowing clear mucous out of her nose. The cough is non productive. She denies shortness of breath. She has taken Tylenol with minimal relief. She has no history of seasonal allergies or breathing problems. She has had sick contacts. She has had her flu shot.  Review of Systems      Past Medical History  Diagnosis Date  . Venous stasis ulcer ([place]) 11/1997    Wound care center, [place]  . Hypothyroidism   . Hypercholesteremia   . Back pain   . Migraine   . Endometriosis     Family History  Problem Relation Age of Onset  . Stroke Mother   . Goiter Mother   . Stomach cancer Sister   . Heart attack Father   . Heart disease Father   . Heart attack Brother   . Kidney disease Brother   . Colon cancer Neg Hx   . Esophageal cancer Neg Hx     Social History   Social History  . Marital Status: Married    Spouse Name: N/A  . Number of Children: 2  . Years of Education: N/A   Occupational History  . Retired    Social History Main Topics  . Smoking status: Never Smoker   . Smokeless tobacco: Not on file  . Alcohol Use: No  . Drug Use: No  . Sexual Activity: Not on file   Other Topics Concern  . Not on file   Social History Narrative   Lives with husband   Does not have living will.   Would desire CPR but would not want to be on life support for prolonged period of time if futile.          Allergies  Allergen Reactions  . Codeine     REACTION: NAUSEA AND VOMITING  . Tramadol     Vomiting, LOC     Constitutional: Positive fatigue and fever. Denies headache, abrupt weight changes.  HEENT:  Positive runny nose, sore throat. Denies eye redness, eye pain, pressure behind the eyes, facial pain, nasal congestion, ear pain, ringing in the ears, wax buildup, or bloody nose. Respiratory: Positive cough. Denies difficulty breathing or shortness of breath.   Cardiovascular: Denies chest pain, chest tightness, palpitations or swelling in the hands or feet.   No other specific complaints in a complete review of systems (except as listed in HPI above).  Objective:   BP 122/66 mmHg  Pulse 76  Temp(Src) 97.8 F (36.6 C) (Oral)  Wt 195 lb 8 oz (88.678 kg)  SpO2 97%  Wt Readings from Last 3 Encounters:  04/11/15 195 lb 8 oz (88.678 kg)  11/14/14 196 lb (88.905 kg)  09/17/14 196 lb (88.905 kg)     General: Appears her stated age, in NAD. HEENT: Head: normal shape and size, no sinus tenderness noted; Eyes: sclera white, no icterus, conjunctiva pink; Ears: bilateral cerumen impaction; Nose: mucosa boggy and moist, septum midline; Throat/Mouth: + PND. Teeth present, mucosa erythematous and moist, no exudate noted, no lesions or ulcerations noted.  Neck: No cervical lymphadenopathy.  Cardiovascular: Normal rate and rhythm. S1,S2 noted.  No murmur, rubs or gallops noted.  Pulmonary/Chest: Normal effort and positive vesicular breath sounds. No respiratory distress. No wheezes, rales or ronchi noted.      Assessment & Plan:   Viral Upper Respiratory Infection with Cough:  Get some  rest and drink plenty of water Do salt water gargles for the sore throat Ibuprofen for inflammation of throat Mucinex 600 mg BID eRx for Tessalon 200 mg TID prn If symptoms persist, call back Monday, will send in [place]  RTC as needed or if symptoms persist.

## 2015-04-11 NOTE — Progress Notes (Signed)
Pre visit review using our clinic review tool, if applicable. No additional management support is needed unless otherwise documented below in the visit note. 

## 2015-04-13 ENCOUNTER — Other Ambulatory Visit: Payer: Self-pay | Admitting: Family Medicine

## 2015-04-14 ENCOUNTER — Other Ambulatory Visit: Payer: Self-pay | Admitting: Internal Medicine

## 2015-04-14 ENCOUNTER — Telehealth: Payer: Self-pay | Admitting: Family Medicine

## 2015-04-14 MED ORDER — AZITHROMYCIN 250 MG PO TABS: ORAL_TABLET | ORAL | Status: DC

## 2015-04-14 NOTE — Telephone Encounter (Signed)
Last f/u 08/2014-[facility] [employer]. Pt has not been prescribed Rx since 2015. pls advise

## 2015-04-14 NOTE — Telephone Encounter (Signed)
Pt called stating she saw [name] on Friday.  Rollene Fare told her if she was not any better to call back and someone would call her in a zpack [facility]  Please advise pt when this has been called in

## 2015-04-14 NOTE — Telephone Encounter (Signed)
Forwarded to [employer]

## 2015-04-14 NOTE — Telephone Encounter (Signed)
Zpack sent to pharmacy  Can you please tell the [employer] staff that [name] is not available for urgent issues and this should have been routed to someone else.

## 2015-05-02 DIAGNOSIS — Z1231 Encounter for screening mammogram for malignant neoplasm of breast: Secondary | ICD-10-CM | POA: Diagnosis not present

## 2015-05-05 ENCOUNTER — Encounter: Payer: Self-pay | Admitting: Family Medicine

## 2015-05-11 ENCOUNTER — Other Ambulatory Visit: Payer: Self-pay | Admitting: Family Medicine

## 2015-06-15 ENCOUNTER — Other Ambulatory Visit: Payer: Self-pay | Admitting: Family Medicine

## 2015-07-29 ENCOUNTER — Encounter: Payer: Self-pay | Admitting: Emergency Medicine

## 2015-07-29 ENCOUNTER — Emergency Department
Admission: EM | Admit: 2015-07-29 | Discharge: 2015-07-29 | Disposition: A | Discharge status: Home / Self Care | Payer: Medicare Other | Attending: Emergency Medicine | Admitting: Emergency Medicine

## 2015-07-29 DIAGNOSIS — Z79899 Other long term (current) drug therapy: Secondary | ICD-10-CM | POA: Insufficient documentation

## 2015-07-29 DIAGNOSIS — Z5181 Encounter for therapeutic drug level monitoring: Secondary | ICD-10-CM | POA: Insufficient documentation

## 2015-07-29 DIAGNOSIS — E039 Hypothyroidism, unspecified: Secondary | ICD-10-CM | POA: Diagnosis not present

## 2015-07-29 DIAGNOSIS — I83892 Varicose veins of left lower extremities with other complications: Secondary | ICD-10-CM | POA: Diagnosis not present

## 2015-07-29 DIAGNOSIS — E785 Hyperlipidemia, unspecified: Secondary | ICD-10-CM | POA: Insufficient documentation

## 2015-07-29 DIAGNOSIS — Z7982 Long term (current) use of aspirin: Secondary | ICD-10-CM | POA: Insufficient documentation

## 2015-07-29 DIAGNOSIS — I8291 Chronic embolism and thrombosis of unspecified vein: Secondary | ICD-10-CM | POA: Diagnosis not present

## 2015-07-29 LAB — CBC
HCT: 38 % (ref 35.0–47.0)
HEMOGLOBIN: 13.4 g/dL (ref 12.0–16.0)
MCH: 32.9 pg (ref 26.0–34.0)
MCHC: 35.2 g/dL (ref 32.0–36.0)
MCV: 93.6 fL (ref 80.0–100.0)
PLATELETS: 244 10*3/uL (ref 150–440)
RBC: 4.06 MIL/uL (ref 3.80–5.20)
RDW: 12.8 % (ref 11.5–14.5)
WBC: 7.9 10*3/uL (ref 3.6–11.0)

## 2015-07-29 LAB — BASIC METABOLIC PANEL
Anion gap: 7 (ref 5–15)
BUN: 13 mg/dL (ref 6–20)
CALCIUM: 9.2 mg/dL (ref 8.9–10.3)
CHLORIDE: 104 mmol/L (ref 101–111)
CO2: 28 mmol/L (ref 22–32)
CREATININE: 0.69 mg/dL (ref 0.44–1.00)
GFR calc Af Amer: >60 mL/min (ref >60)
GFR calc non Af Amer: >60 mL/min (ref >60)
GLUCOSE: 110 mg/dL — AB (ref 65–99)
Potassium: 4.4 mmol/L (ref 3.5–5.1)
Sodium: 139 mmol/L (ref 135–145)

## 2015-07-29 LAB — PROTIME-INR
INR: 0.98
Prothrombin Time: 13.2 seconds (ref 11.4–15.0)

## 2015-07-29 MED ORDER — SILVER NITRATE-POT NITRATE 75-25 % EX MISC
CUTANEOUS | Status: AC
  Filled 2015-07-29: qty 1

## 2015-07-29 MED ORDER — SILVER NITRATE-POT NITRATE 75-25 % EX MISC
CUTANEOUS | Status: AC
  Administered 2015-07-29: 2 via TOPICAL
  Filled 2015-07-29: qty 1

## 2015-07-29 MED ORDER — SILVER NITRATE-POT NITRATE 75-25 % EX MISC
2.0000 | Freq: Once | CUTANEOUS | Status: AC
  Administered 2015-07-29: 2 via TOPICAL

## 2015-07-29 NOTE — ED Notes (Signed)
Pt to ED via EMS from home for bleeding verucose veins to left foot. Pt was in shower when vein hemorrhaged. Per EMS pt had aprox 350-500 ML  Of blood loss. Bleeding controlled at this time. Pt A&O

## 2015-07-29 NOTE — ED Provider Notes (Signed)
[facility] Emergency Department Provider Note  Time seen: 9:10 PM  I have reviewed the triage vital signs and the nursing notes.   HISTORY  Chief Complaint Bleeding/Bruising    HPI [name] is a 75 y.o. female with a past medical history of venous stasis, hypothyroidism, hyperlipidemia, migraines, presents the emergency department with varicose vein bleed. According to the patient she was in the shower when she looked down and saw blood. She states is significant amount of bleeding from the left foot. She held pressure on the foot and called EMS. EMS estimated 300 cc of blood loss at the scene. Patient denies any pain. Denies any other bleeding.     Past Medical History  Diagnosis Date  . Venous stasis ulcer ([place]) 11/1997    Wound care center, [place]  . Hypothyroidism   . Hypercholesteremia   . Back pain   . Migraine   . Endometriosis     Patient Active Problem List   Diagnosis Date Noted  . Spondylosis 09/17/2014  . 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 Itching 09/09/2014  . Low back pain 09/09/2014  . HLD (hyperlipidemia) 04/16/2013  . HTN (hypertension) 05/08/2012  . Post menopausal syndrome 01/27/2012  . 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 UNSPECIFIED VENOUS INSUFFICIENCY 09/08/2007    Past Surgical History  Procedure Laterality Date  . Abdominal hysterectomy      1 1/2 ovaries gone, second to endometriosis  . Appendectomy    . Thyroidectomy    . Hernia repair    . Rotator cuff repair    . Cystoscopy    . Hemorrhoid surgery      Current Outpatient Rx  Name  Route  Sig  Dispense  Refill  . aspirin EC 81 MG tablet   Oral   Take 81 mg by mouth daily.         [name] atenolol (TENORMIN) 50 MG tablet   Oral   Take 1 tablet (50 mg total) by mouth daily.   90 tablet   3   . azithromycin (ZITHROMAX) 250 MG tablet      Take 2 tabs today, then 1 tab daily x 4 days   6 tablet   0   . benzonatate (TESSALON) 200 MG  capsule   Oral   Take 1 capsule (200 mg total) by mouth 2 (two) times daily as needed for cough.   30 capsule   0   . butalbital-acetaminophen-caffeine (FIORICET, ESGIC) 50-325-40 MG tablet      TAKE ONE TABLET BY MOUTH TWICE A DAY AS NEEDED FOR HEADACHE   60 tablet   0   . Cholecalciferol (VITAMIN D-3) 1000 UNITS CAPS   Oral   Take 1 capsule by mouth daily.         . fluocinonide-emollient (LIDEX-E) 0.05 % cream   Topical   Apply 1 application topically 2 (two) times daily.   30 g   0   . ibuprofen (ADVIL,MOTRIN) 800 MG tablet      TAKE 1 TABLET BY MOUTH EVERY 4 TO 6 HOURS AS NEEDED   180 tablet   1   . levothyroxine (SYNTHROID, LEVOTHROID) 100 MCG tablet      TAKE 1 TABLET (100 MCG TOTAL) BY MOUTH DAILY BEFORE BREAKFAST.   90 tablet   0   . Multiple Vitamin (MULTIVITAMIN) tablet   Oral   Take 1 tablet by mouth daily.           . simvastatin (ZOCOR)  40 MG tablet      TAKE 1 TABLET BY MOUTH AT BEDTIME   90 tablet   0   . SUMAtriptan (IMITREX) 20 MG/ACT nasal spray   Nasal   Place 1 spray into the nose as needed.             Allergies Codeine and Tramadol  Family History  Problem Relation Age of Onset  . Stroke Mother   . Goiter Mother   . Stomach cancer Sister   . Heart attack Father   . Heart disease Father   . Heart attack Brother   . Kidney disease Brother   . Colon cancer Neg Hx   . Esophageal cancer Neg Hx     Social History Social History  Substance Use Topics  . Smoking status: Never Smoker   . Smokeless tobacco: Not on file  . Alcohol Use: No    Review of Systems Constitutional: Negative for fever. Cardiovascular: Negative for chest pain. Respiratory: Negative for shortness of breath. Gastrointestinal: Negative for abdominal pain Genitourinary: Negative for dysuria. Neurological: Negative for headache 10-point ROS otherwise negative.  ____________________________________________   PHYSICAL EXAM:  Constitutional:  Alert and oriented. Well appearing and in no distress. Eyes: Normal exam ENT   Head: Normocephalic and atraumatic.   Mouth/Throat: Mucous membranes are moist. Cardiovascular: Normal rate, regular rhythm. No murmur Respiratory: Normal respiratory effort without tachypnea nor retractions. Breath sounds are clear Gastrointestinal: Soft and nontender. No distention.  Musculoskeletal: Nontender with normal range of motion in all extremities.  Neurologic:  Normal speech and language. No gross focal neurologic deficits  Skin:  Skin is warm, dry and intact.  Psychiatric: Mood and affect are normal.   ____________________________________________   INITIAL IMPRESSION / ASSESSMENT AND PLAN / ED COURSE  Pertinent labs & imaging results that were available during my care of the patient were reviewed by me and considered in my medical decision making (see chart for details).  Patient presents the emergency department with a bleeding varicose vein to the left foot. No other bleeding. Due to the amount of blood loss at the scene we will check labs. The patient denies any symptoms. Denies any pain. There is a small pen tip sized area to the dorsal aspect of the left foot with a bleed likely occurred. Dried blood around the area. We have cleaned, use silver nitrate and Dermabond and covered with a dressing.  Patient remains hemostatic. We will cover with a dressing. Patient's labs are within normal limits. We'll discharge home with PCP follow-up.    ____________________________________________   FINAL CLINICAL IMPRESSION(S) / ED DIAGNOSES  Varicose vein bleed   Harvest Dark, MD 07/29/15 2250

## 2015-07-29 NOTE — Discharge Instructions (Signed)
Bleeding Varicose Veins Varicose veins are veins that have become enlarged and twisted. Valves in the veins help return blood from the leg to the heart. If these valves are damaged, blood flows backward and backs up into the veins in the leg near the skin. This causes the veins to become larger because of increased pressure within them. Sometimes these veins bleed. CAUSES  Factors that can lead to bleeding varicose veins include:  Thinning of the skin that covers the veins. This skin is stretched as the veins enlarge.  Weak and thinning walls of the varicose veins. These thin walls are part of the reason why blood is not flowing normally to the heart.  Having high pressure in the veins. This high pressure occurs because the blood is not flowing freely back up to the heart.  Injury. Even a small injury to a varicose vein can cause bleeding.  Open wounds. A sore may develop near a varicose vein and not heal. This makes bleeding more likely.  Taking medicine that thins the blood. These medicines may include aspirin, anti-inflammatory medicine, and other blood thinners. SIGNS AND SYMPTOMS  If bleeding is on the outside surface of the skin, blood can be seen. Sometimes, the bleeding stays under the skin. If this happens, the blue or purple area will spread beyond the vein. This discoloration may be visible. DIAGNOSIS  To decide if you have a bleeding varicose vein, your health care provider may:  Ask about your symptoms. This will include when you first saw bleeding.  Ask about how long you have had varicose veins and if they cause you problems.  Ask about your overall health.  Ask about possible causes, such as recent cuts or if the area near the varicose veins was bumped or injured.  Examine the skin or leg that concerns you. Your health care provider will probably feel the veins.  Order imaging tests. These create detailed pictures of the veins. TREATMENT  The first goal of treating  bleeding varicose veins is to stop the bleeding. Then, the aim is to keep any bleeding from happening again. Treatment will depend on the cause of the bleeding and how bad it is. Ask your health care provider about what would be best for you. Options include:  Raising (elevating) your leg. Lie down with your leg propped up on a pillow or cushion. Your foot should be above the level of your heart.  Applying pressure to the spot that is bleeding. The bleeding should stop in a short time.  Wearing elastic stockings that "compress" your legs (compression stockings). An elastic bandage may do the same thing.  Applying an antibiotic cream on sores that are not healing.  Closing off or surgically removing the bleeding varicose veins with one of the following:  Sclerotherapy. A solution is injected into the vein to close it off.  Laser treatment. A laser is used to heat the vein to close it off.  Radiofrequency vein ablation. An electrical current produced by radio waves is used to close off the vein.  Phlebectomy. The vein is surgically removed through small incisions made over the varicose vein.  Vein ligation and stripping. The vein is surgically removed through incisions made over the varicose vein after the vein has been tied (ligated). HOME CARE INSTRUCTIONS   Apply any creams that your health care provider prescribed. Follow the directions carefully.  Wear compression stockings or any wraps as directed by your health care provider. Make sure you know:  If   you should wear them every day.  How long you should wear them.  If veins were removed or closed, a bandage (dressing) will probably cover the area. Make sure you know:  How often the dressing should be changed.  Whether the area can get wet.  When you can leave the skin uncovered.  Check your skin every day. Look for new sores and signs of bleeding.  To prevent future bleeding:  Use extra care in situations where you could  cut your legs, such as when shaving or gardening.  Try to keep your legs elevated as much as possible. Lie down when you can. SEEK MEDICAL CARE IF:   Your veins continue to bleed.  You develop new sores near your varicose veins.  You have a sore that does not heal or gets bigger.  You have increased pain in your leg.  The area around a varicose vein becomes warm, red, or tender to the touch.  You notice a yellowish fluid that smells bad coming from a spot where there was bleeding.  You have a fever. SEEK IMMEDIATE MEDICAL CARE IF:   You have chest pain or difficulty breathing.  You have severe leg pain.   This information is not intended to replace advice given to you by your health care provider. Make sure you discuss any questions you have with your health care provider.   Document Released: 05/23/2008 Document Revised: 01/25/2014 Document Reviewed: 05/08/2013 Elsevier Interactive Patient Education 2016 Elsevier Inc.  

## 2015-08-06 ENCOUNTER — Ambulatory Visit (INDEPENDENT_AMBULATORY_CARE_PROVIDER_SITE_OTHER): Payer: Medicare Other | Admitting: Family Medicine

## 2015-08-06 VITALS — BP 100/60 | HR 72 | Temp 97.5°F | Wt 195.0 lb

## 2015-08-06 DIAGNOSIS — I83899 Varicose veins of unspecified lower extremities with other complications: Secondary | ICD-10-CM | POA: Diagnosis not present

## 2015-08-06 NOTE — Progress Notes (Signed)
Pre visit review using our clinic review tool, if applicable. No additional management support is needed unless otherwise documented below in the visit note. 

## 2015-08-06 NOTE — Assessment & Plan Note (Signed)
New- resolved s/p cautery. No further work up indicated at this time. Call or return to clinic prn if these symptoms worsen or fail to improve as anticipated. The patient indicates understanding of these issues and agrees with the plan.

## 2015-08-06 NOTE — Progress Notes (Signed)
Subjective:   Patient ID: [name], female    DOB: 1940-07-20, 75 y.o.   MRN: [id number]  [name] is a pleasant 75 y.o. year old female who presents to clinic today with Hospitalization Follow-up  on 08/06/2015  HPI:  Was seen at [facility] on 07/29/15 for a varicose vein bleed.  Notes reviewed.  Pt reports being in the shower and looking down to see significant amount of blood coming from a varicose vein on her left foot.  She called EMS who then transferred her to [facility] ED.  Wound was cleaned, silver nitrate and dermabond applied.  Bleeding has not restarted.  Lab Results  Component Value Date   WBC 7.9 07/29/2015   HGB 13.4 07/29/2015   HCT 38.0 07/29/2015   MCV 93.6 07/29/2015   PLT 244 07/29/2015   Current Outpatient Prescriptions on File Prior to Visit  Medication Sig Dispense Refill  . aspirin EC 81 MG tablet Take 81 mg by mouth daily.    [name] atenolol (TENORMIN) 50 MG tablet Take 1 tablet (50 mg total) by mouth daily. 90 tablet 3  . butalbital-acetaminophen-caffeine (FIORICET, ESGIC) 50-325-40 MG tablet TAKE ONE TABLET BY MOUTH TWICE A DAY AS NEEDED FOR HEADACHE 60 tablet 0  . Cholecalciferol (VITAMIN D-3) 1000 UNITS CAPS Take 1 capsule by mouth daily.    . fluocinonide-emollient (LIDEX-E) 0.05 % cream Apply 1 application topically 2 (two) times daily. 30 g 0  . ibuprofen (ADVIL,MOTRIN) 800 MG tablet TAKE 1 TABLET BY MOUTH EVERY 4 TO 6 HOURS AS NEEDED 180 tablet 1  . levothyroxine (SYNTHROID, LEVOTHROID) 100 MCG tablet TAKE 1 TABLET (100 MCG TOTAL) BY MOUTH DAILY BEFORE BREAKFAST. 90 tablet 0  . Multiple Vitamin (MULTIVITAMIN) tablet Take 1 tablet by mouth daily.      . simvastatin (ZOCOR) 40 MG tablet TAKE 1 TABLET BY MOUTH AT BEDTIME 90 tablet 0  . SUMAtriptan (IMITREX) 20 MG/ACT nasal spray Place 1 spray into the nose as needed.       No current facility-administered medications on file prior to visit.    Allergies  Allergen Reactions  . Codeine    REACTION: NAUSEA AND VOMITING  . Tramadol     Vomiting, LOC    Past Medical History  Diagnosis Date  . Venous stasis ulcer ([place]) 11/1997    Wound care center, [place]  . Hypothyroidism   . Hypercholesteremia   . Back pain   . Migraine   . Endometriosis     Past Surgical History  Procedure Laterality Date  . Abdominal hysterectomy      1 1/2 ovaries gone, second to endometriosis  . Appendectomy    . Thyroidectomy    . Hernia repair    . Rotator cuff repair    . Cystoscopy    . Hemorrhoid surgery      Family History  Problem Relation Age of Onset  . Stroke Mother   . Goiter Mother   . Stomach cancer Sister   . Heart attack Father   . Heart disease Father   . Heart attack Brother   . Kidney disease Brother   . Colon cancer Neg Hx   . Esophageal cancer Neg Hx     Social History   Social History  . Marital Status: Married    Spouse Name: N/A  . Number of Children: 2  . Years of Education: N/A   Occupational History  . Retired    Social History Main Topics  .  Smoking status: Never Smoker   . Smokeless tobacco: Not on file  . Alcohol Use: No  . Drug Use: No  . Sexual Activity: Not on file   Other Topics Concern  . Not on file   Social History Narrative   Lives with husband   Does not have living will.   Would desire CPR but would not want to be on life support for prolonged period of time if futile.         The PMH, PSH, Social History, Family History, Medications, and allergies have been reviewed in [facility], and have been updated if relevant.   Review of Systems  Constitutional: Negative.   Gastrointestinal: Negative.   Endocrine: Negative.   Musculoskeletal: Negative.   Hematological: Negative.   All other systems reviewed and are negative.      Objective:    BP 100/60 mmHg  Pulse 72  Temp(Src) 97.5 F (36.4 C) (Oral)  Wt 195 lb (88.451 kg)  SpO2 96%   Physical Exam  Constitutional: She appears well-developed and well-nourished.  No distress.  HENT:  Head: Normocephalic.  Eyes: Conjunctivae are normal.  Pulmonary/Chest: Effort normal.  Musculoskeletal:       Feet:  Skin: She is not diaphoretic.  Psychiatric: She has a normal mood and affect. Her behavior is normal. Judgment and thought content normal.  Nursing note and vitals reviewed.         Assessment & Plan:   Bleeding from varicose vein, unspecified laterality No Follow-up on file.

## 2015-08-10 ENCOUNTER — Other Ambulatory Visit: Payer: Self-pay | Admitting: Family Medicine

## 2015-08-17 IMAGING — CR DG SHOULDER 2+V*R*
3 series · 3 of 3 positions shown · non-contrast
Comparison: None.

CLINICAL DATA: Fall in [REDACTED].  Right shoulder pain.

EXAM:
RIGHT SHOULDER - 2+ VIEW

[view not recorded (1 of 3)]
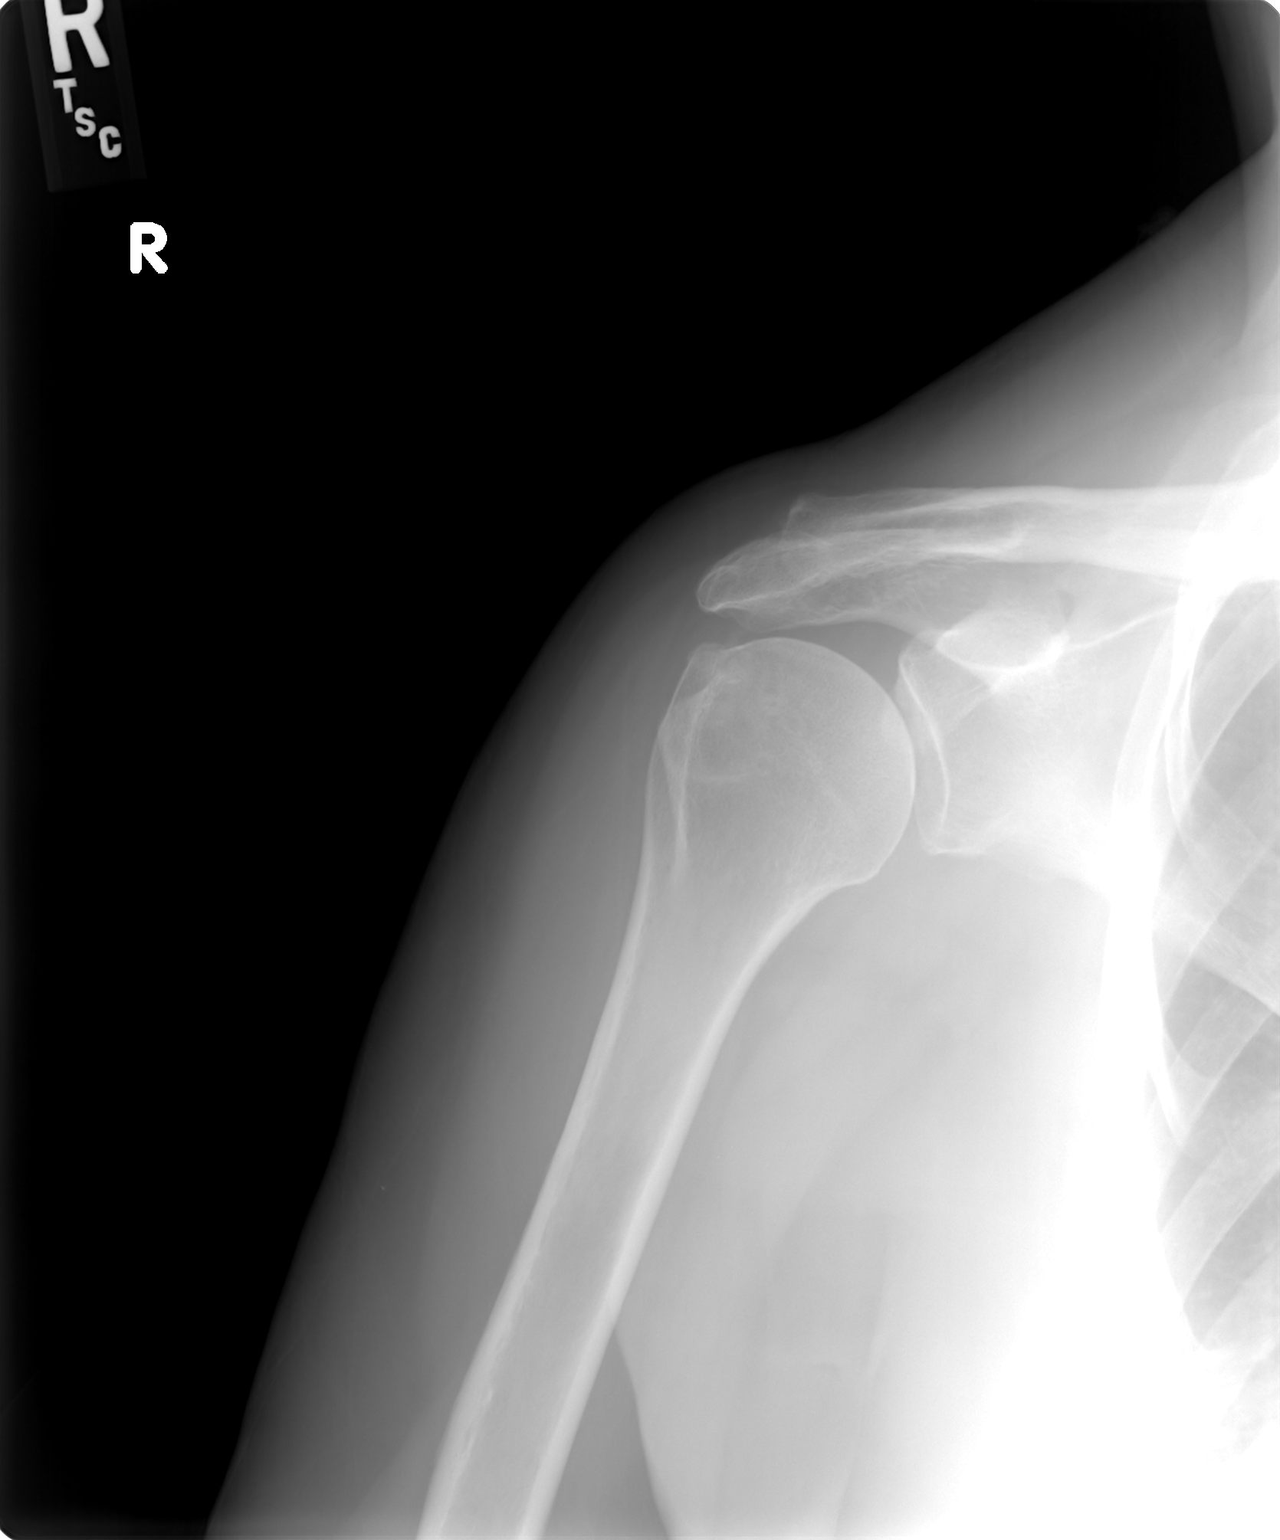

[view not recorded (2 of 3)]
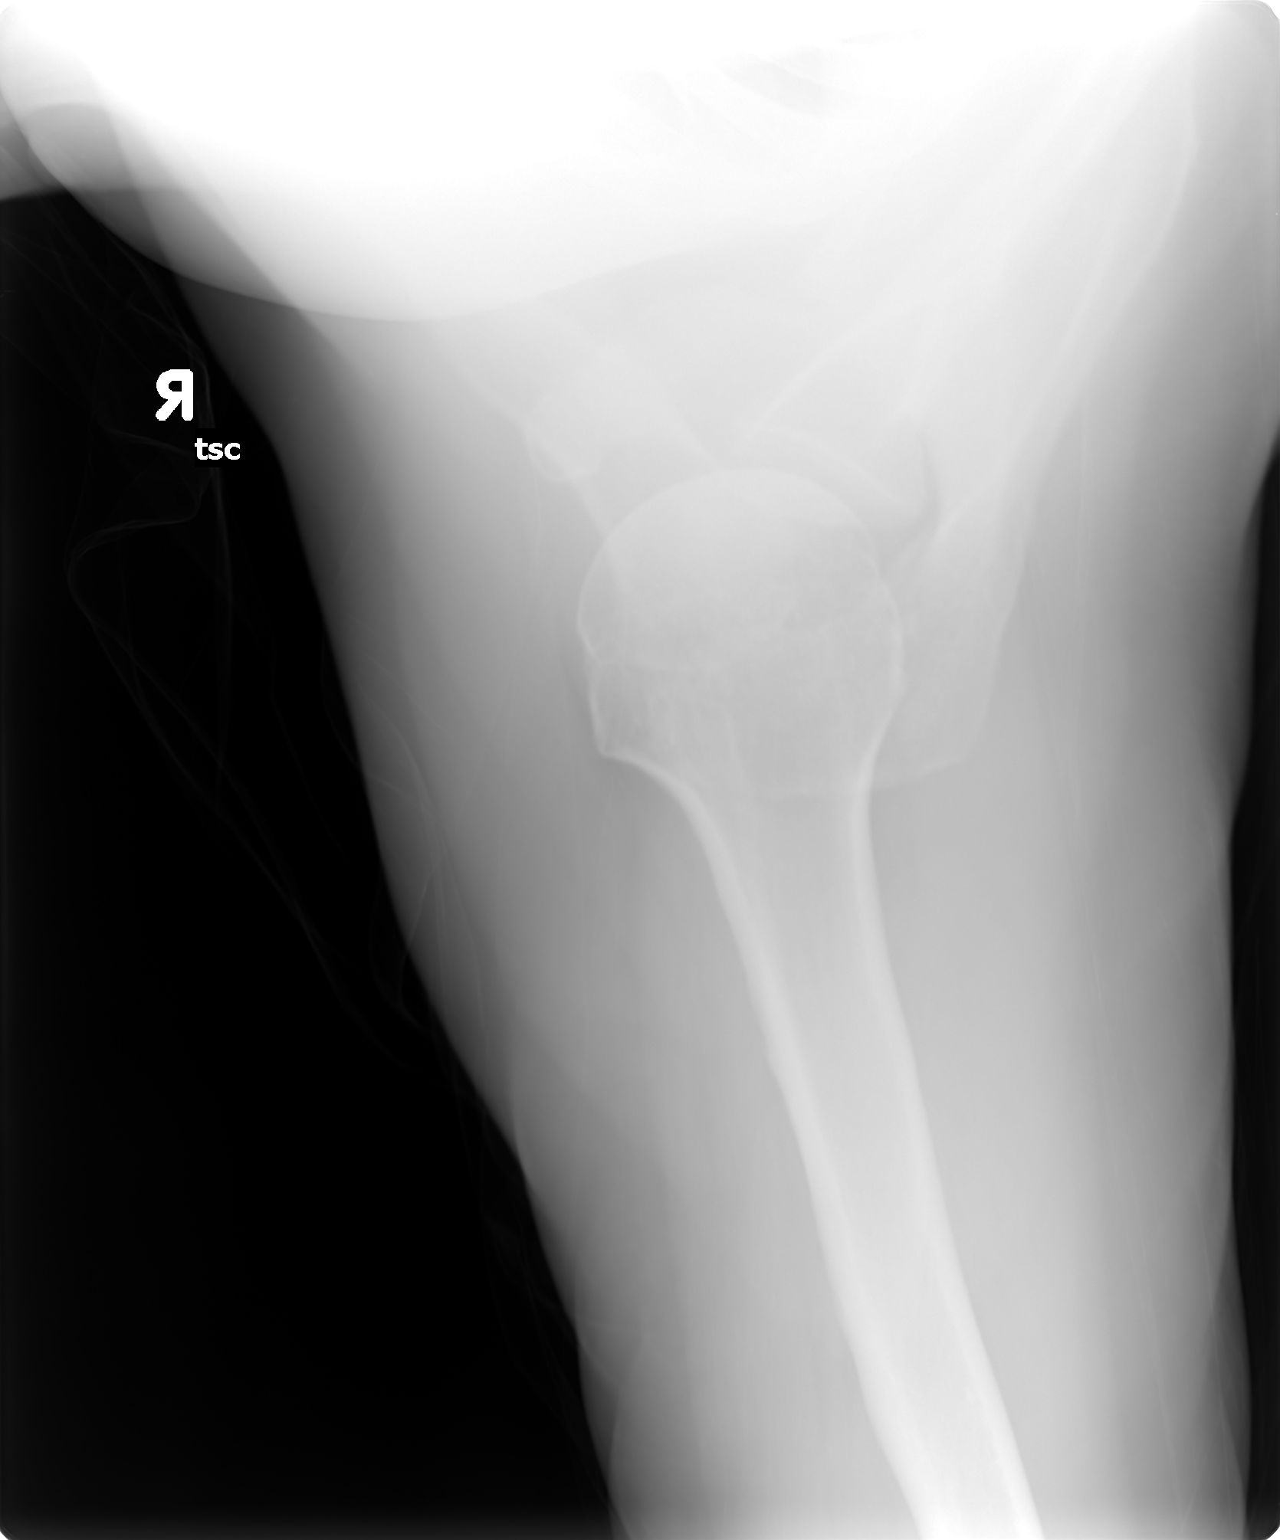

[view not recorded (3 of 3)]
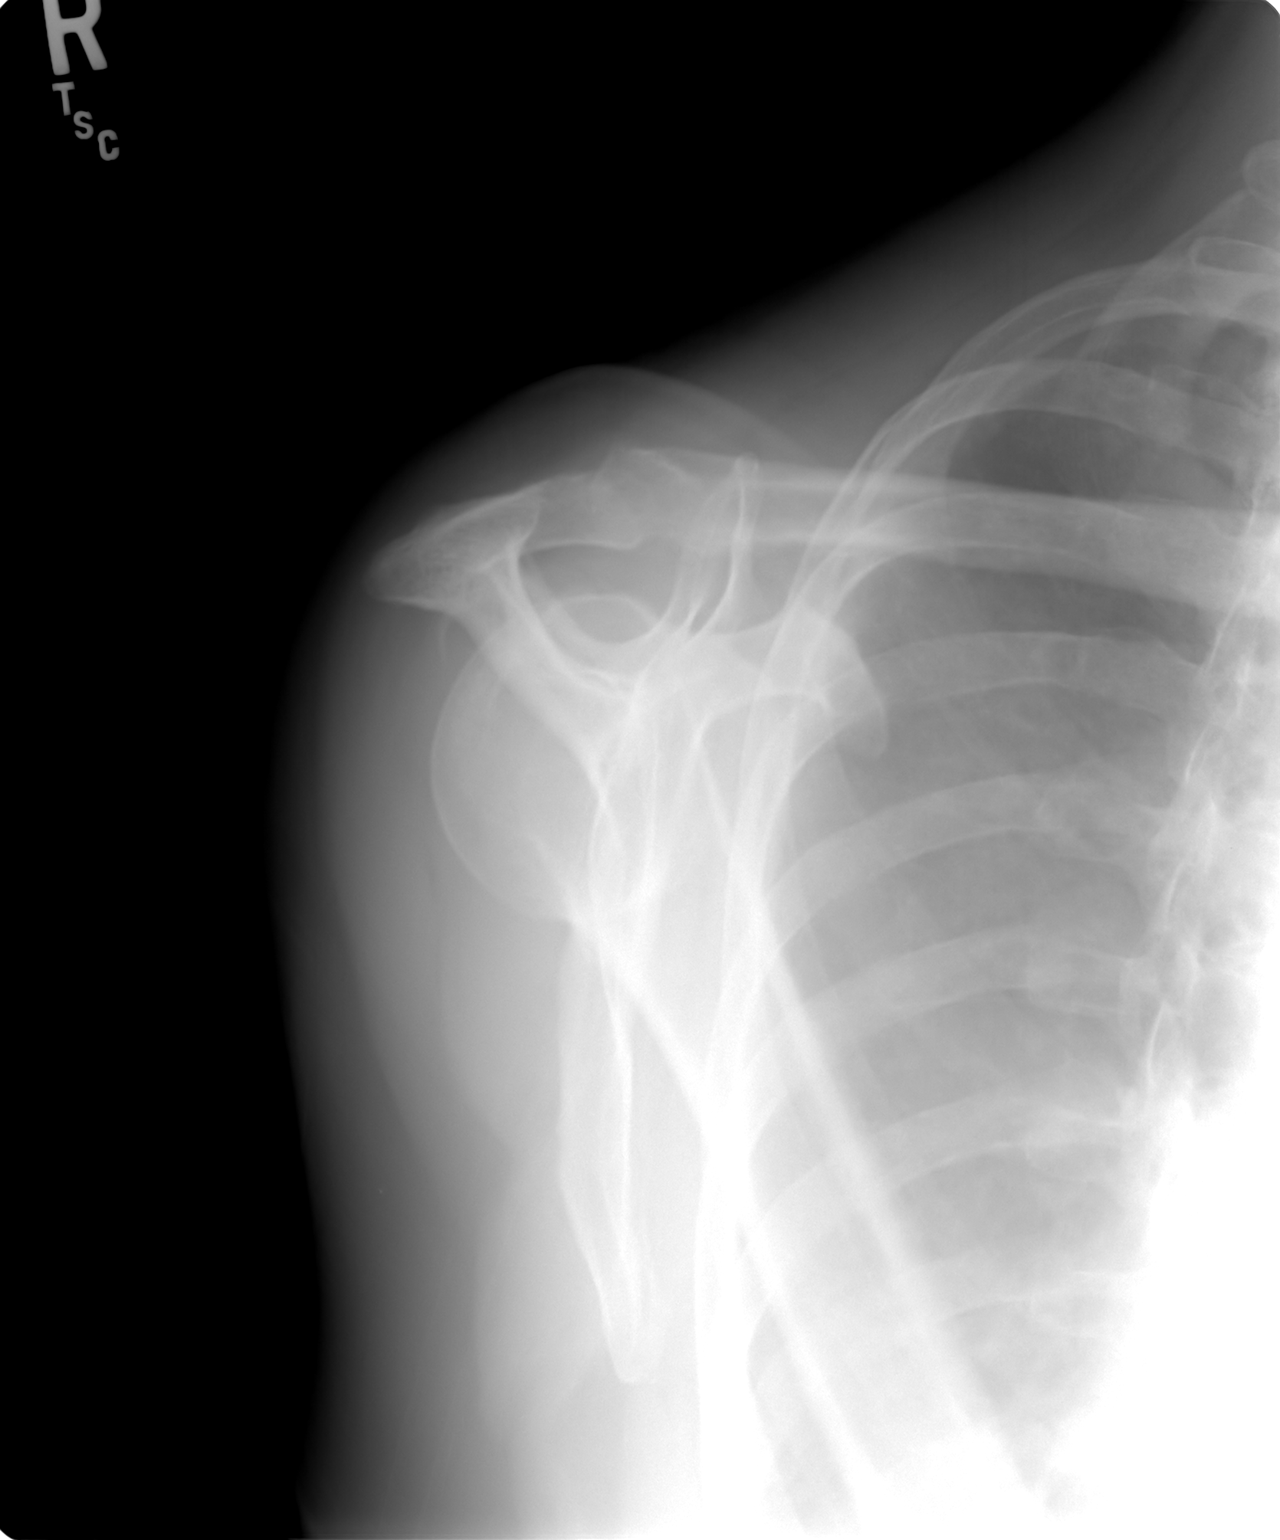

[3 of 3 positions shown; findings below may reference images not displayed]

FINDINGS: No fracture or dislocation. Glenohumeral joint is well preserved.
Minimal degenerative changes noted at the AC joint. Bones are
demineralized. The underlying ribs are intact.
IMPRESSION: No fracture or acute finding.  Minor AC joint osteoarthritis.

## 2015-08-28 ENCOUNTER — Other Ambulatory Visit: Payer: Self-pay | Admitting: Internal Medicine

## 2015-08-28 DIAGNOSIS — E785 Hyperlipidemia, unspecified: Secondary | ICD-10-CM

## 2015-08-28 DIAGNOSIS — E039 Hypothyroidism, unspecified: Secondary | ICD-10-CM

## 2015-08-28 DIAGNOSIS — E2839 Other primary ovarian failure: Secondary | ICD-10-CM

## 2015-09-03 ENCOUNTER — Other Ambulatory Visit (INDEPENDENT_AMBULATORY_CARE_PROVIDER_SITE_OTHER): Payer: Medicare Other

## 2015-09-03 DIAGNOSIS — E785 Hyperlipidemia, unspecified: Secondary | ICD-10-CM | POA: Diagnosis not present

## 2015-09-03 DIAGNOSIS — E2839 Other primary ovarian failure: Secondary | ICD-10-CM

## 2015-09-03 DIAGNOSIS — E039 Hypothyroidism, unspecified: Secondary | ICD-10-CM | POA: Diagnosis not present

## 2015-09-03 LAB — CBC WITH DIFFERENTIAL/PLATELET
BASOS ABS: 0 10*3/uL (ref 0.0–0.1)
BASOS PCT: 0.7 % (ref 0.0–3.0)
EOS ABS: 0.3 10*3/uL (ref 0.0–0.7)
Eosinophils Relative: 5.3 % — ABNORMAL HIGH (ref 0.0–5.0)
HCT: 36.9 % (ref 36.0–46.0)
Hemoglobin: 12.6 g/dL (ref 12.0–15.0)
LYMPHS PCT: 27.5 % (ref 12.0–46.0)
Lymphs Abs: 1.6 10*3/uL (ref 0.7–4.0)
MCHC: 34 g/dL (ref 30.0–36.0)
MCV: 93.9 fl (ref 78.0–100.0)
MONO ABS: 0.6 10*3/uL (ref 0.1–1.0)
Monocytes Relative: 9.6 % (ref 3.0–12.0)
NEUTROS ABS: 3.4 10*3/uL (ref 1.4–7.7)
NEUTROS PCT: 56.9 % (ref 43.0–77.0)
PLATELETS: 245 10*3/uL (ref 150.0–400.0)
RBC: 3.93 Mil/uL (ref 3.87–5.11)
RDW: 13.3 % (ref 11.5–15.5)
WBC: 5.9 10*3/uL (ref 4.0–10.5)

## 2015-09-03 LAB — COMPREHENSIVE METABOLIC PANEL
ALBUMIN: 4 g/dL (ref 3.5–5.2)
ALK PHOS: 82 U/L (ref 39–117)
ALT: 37 U/L — ABNORMAL HIGH (ref 0–35)
AST: 34 U/L (ref 0–37)
BUN: 13 mg/dL (ref 6–23)
CHLORIDE: 104 meq/L (ref 96–112)
CO2: 29 mEq/L (ref 19–32)
CREATININE: 0.67 mg/dL (ref 0.40–1.20)
Calcium: 9.3 mg/dL (ref 8.4–10.5)
GFR: 91.22 mL/min (ref >60.00)
GLUCOSE: 103 mg/dL — AB (ref 70–99)
POTASSIUM: 4.1 meq/L (ref 3.5–5.1)
SODIUM: 140 meq/L (ref 135–145)
TOTAL PROTEIN: 6.8 g/dL (ref 6.0–8.3)
Total Bilirubin: 0.5 mg/dL (ref 0.2–1.2)

## 2015-09-03 LAB — LIPID PANEL
CHOL/HDL RATIO: 4
Cholesterol: 171 mg/dL (ref 0–200)
HDL: 45.8 mg/dL (ref >39.00)
LDL CALC: 93 mg/dL (ref 0–99)
NONHDL: 125.47
TRIGLYCERIDES: 162 mg/dL — AB (ref 0.0–149.0)
VLDL: 32.4 mg/dL (ref 0.0–40.0)

## 2015-09-03 LAB — T4, FREE: Free T4: 0.95 ng/dL (ref 0.60–1.60)

## 2015-09-03 LAB — TSH: TSH: 0.51 u[IU]/mL (ref 0.35–4.50)

## 2015-09-03 LAB — VITAMIN D 25 HYDROXY (VIT D DEFICIENCY, FRACTURES): VITD: 28.35 ng/mL — ABNORMAL LOW (ref 30.00–100.00)

## 2015-09-10 ENCOUNTER — Encounter: Payer: Self-pay | Admitting: Family Medicine

## 2015-09-10 ENCOUNTER — Ambulatory Visit (INDEPENDENT_AMBULATORY_CARE_PROVIDER_SITE_OTHER): Payer: Medicare Other | Admitting: Family Medicine

## 2015-09-10 VITALS — BP 142/72 | HR 58 | Temp 97.5°F | Ht 68.0 in | Wt 192.8 lb

## 2015-09-10 DIAGNOSIS — E890 Postprocedural hypothyroidism: Secondary | ICD-10-CM | POA: Diagnosis not present

## 2015-09-10 DIAGNOSIS — E785 Hyperlipidemia, unspecified: Secondary | ICD-10-CM

## 2015-09-10 DIAGNOSIS — Z0000 Encounter for general adult medical examination without abnormal findings: Secondary | ICD-10-CM

## 2015-09-10 DIAGNOSIS — I10 Essential (primary) hypertension: Secondary | ICD-10-CM | POA: Diagnosis not present

## 2015-09-10 DIAGNOSIS — E559 Vitamin D deficiency, unspecified: Secondary | ICD-10-CM

## 2015-09-10 NOTE — Progress Notes (Signed)
Pre visit review using our clinic review tool, if applicable. No additional management support is needed unless otherwise documented below in the visit note. 

## 2015-09-10 NOTE — Assessment & Plan Note (Signed)
The patients weight, height, BMI and visual acuity have been recorded in the chart.  Cognitive function assessed.   I have made referrals, counseling and provided education to the patient based review of the above and I have provided the pt with a written personalized care plan for preventive services.  

## 2015-09-10 NOTE — Patient Instructions (Signed)
Good to see you. Please start taking Vit D 2000 IU daily.  Happy birthday!

## 2015-09-10 NOTE — Assessment & Plan Note (Signed)
New- advised to start 2000 IU of Vit D OTC daily.

## 2015-09-10 NOTE — Assessment & Plan Note (Signed)
Stable on current dose of synthroid. No changes made.

## 2015-09-10 NOTE — Assessment & Plan Note (Signed)
Well controlled.  No changes made. 

## 2015-09-10 NOTE — Assessment & Plan Note (Signed)
Reasonable control. No changes made. 

## 2015-09-10 NOTE — Progress Notes (Signed)
Subjective:    Patient ID: [name], female    DOB: 1940/12/28, 75 y.o.   MRN: [id number]  HPI  74 yo very pleasant female with h/o HLD, HTN, hypothyroidism, venous insufficiency here medicare wellness visit, follow up of chronic medical conditions and to discuss itching on her back.  I have personally reviewed the Medicare Annual Wellness questionnaire and have noted 1. The patient's medical and social history 2. Their use of alcohol, tobacco or illicit drugs 3. Their current medications and supplements 4. The patient's functional ability including ADL's, fall risks, home safety risks and hearing or visual             impairment. 5. Diet and physical activities 6. Evidence for depression or mood disorders  End of life wishes discussed and updated in Social History.  The roster of all physicians providing medical care to patient - is listed in the Snapshot section of the chart.  Pneumovax 08/15/06 Prevnar 13 09/03/13 Tdap 01/18/13 Colonoscopy [facility])- 10/20/10- 10 year recall Mammogram 05/02/15 Zostavax at [facility] 2  Eye exam UTD Dr. [name] Burly     HLD-  on zocor 40 mg daily.   Lab Results  Component Value Date   CHOL 171 09/03/2015   HDL 45.80 09/03/2015   LDLCALC 93 09/03/2015   LDLDIRECT 85.2 01/17/2012   TRIG 162.0 (H) 09/03/2015   CHOLHDL 4 09/03/2015    Hypothyroidism- on synthroid 100 mcg daily. Followed by Dr. Howell Rucks.  Last saw her on 07/04/14- note reviewed.  No diarrhea, no palpitations, no CP, no tremors, no SOB.  Lab Results  Component Value Date   TSH 0.51 09/03/2015    HTN- stable on current dose of Atenolol.   Lab Results  Component Value Date   CREATININE 0.67 09/03/2015   Lab Results  Component Value Date   ALT 37 (H) 09/03/2015   AST 34 09/03/2015   ALKPHOS 82 09/03/2015   BILITOT 0.5 09/03/2015   Lab Results  Component Value Date   NA 140 09/03/2015   K 4.1 09/03/2015   CL 104 09/03/2015   CO2 29 09/03/2015    Patient  Active Problem List  Diagnosis  . Postsurgical hypothyroidism  . UNSPECIFIED VENOUS INSUFFICIENCY  . Post menopausal syndrome  . HTN (hypertension)  . HLD (hyperlipidemia)  . Itching  . Low back pain  . Spondylosis  . Idiopathic scoliosis  . Bleeding from varicose vein   Past Medical History:  Diagnosis Date  . Back pain   . Endometriosis   . Hypercholesteremia   . Hypothyroidism   . Migraine   . Venous stasis ulcer ([place]) 11/1997   Wound care center, [place]   Past Surgical History:  Procedure Laterality Date  . ABDOMINAL HYSTERECTOMY     1 1/2 ovaries gone, second to endometriosis  . APPENDECTOMY    . CYSTOSCOPY    . HEMORRHOID SURGERY    . HERNIA REPAIR    . ROTATOR CUFF REPAIR    . THYROIDECTOMY     Social History  Substance Use Topics  . Smoking status: Never Smoker  . Smokeless tobacco: Not on file  . Alcohol use No   Family History  Problem Relation Age of Onset  . Stroke Mother   . Goiter Mother   . Stomach cancer Sister   . Heart attack Father   . Heart disease Father   . Heart attack Brother   . Kidney disease Brother   . Colon cancer Neg Hx   .  Esophageal cancer Neg Hx    Allergies  Allergen Reactions  . Codeine     REACTION: NAUSEA AND VOMITING  . Tramadol     Vomiting, LOC   Current Outpatient Prescriptions on File Prior to Visit  Medication Sig Dispense Refill  . aspirin EC 81 MG tablet Take 81 mg by mouth daily.    [name] atenolol (TENORMIN) 50 MG tablet TAKE 1 TABLET (50 MG TOTAL) BY MOUTH DAILY. 90 tablet 3  . butalbital-acetaminophen-caffeine (FIORICET, ESGIC) 50-325-40 MG tablet TAKE ONE TABLET BY MOUTH TWICE A DAY AS NEEDED FOR HEADACHE 60 tablet 0  . Cholecalciferol (VITAMIN D-3) 1000 UNITS CAPS Take 1 capsule by mouth daily.    . fluocinonide-emollient (LIDEX-E) 0.05 % cream Apply 1 application topically 2 (two) times daily. 30 g 0  . ibuprofen (ADVIL,MOTRIN) 800 MG tablet TAKE 1 TABLET BY MOUTH EVERY 4 TO 6 HOURS AS NEEDED 180  tablet 1  . levothyroxine (SYNTHROID, LEVOTHROID) 100 MCG tablet TAKE 1 TABLET (100 MCG TOTAL) BY MOUTH DAILY BEFORE BREAKFAST. 90 tablet 0  . Multiple Vitamin (MULTIVITAMIN) tablet Take 1 tablet by mouth daily.      . simvastatin (ZOCOR) 40 MG tablet TAKE 1 TABLET BY MOUTH AT BEDTIME 90 tablet 0  . SUMAtriptan (IMITREX) 20 MG/ACT nasal spray Place 1 spray into the nose as needed.       No current facility-administered medications on file prior to visit.    The PMH, PSH, Social History, Family History, Medications, and allergies have been reviewed in [facility], and have been updated if relevant.      Review of Systems  Constitutional: Negative.   HENT: Negative.   Eyes: Negative.   Respiratory: Negative.   Cardiovascular: Negative.  Negative for chest pain.  Gastrointestinal: Negative.   Endocrine: Negative.   Genitourinary: Negative.   Musculoskeletal: Negative.   Skin: Positive for rash.  Allergic/Immunologic: Negative.   Neurological: Negative.   Hematological: Negative.   Psychiatric/Behavioral: Negative.   All other systems reviewed and are negative.      Objective:   Physical Exam BP (!) 142/72   Pulse (!) 58   Temp 97.5 F (36.4 C) (Oral)   Ht 5\' 8"  (1.727 m)   Wt 192 lb 12 oz (87.4 kg)   SpO2 98%   BMI 29.31 kg/m     General:  Well-developed,well-nourished,in no acute distress; alert,appropriate and cooperative throughout examination Head:  normocephalic and atraumatic.   Eyes:  vision grossly intact, pupils equal, pupils round, and pupils reactive to light.   Ears:  R ear normal and L ear normal.   Nose:  no external deformity.   Mouth:  good dentition.   Neck:  No deformities, masses, or tenderness noted. Breasts:  No mass, nodules, thickening, tenderness, bulging, retraction, inflamation, nipple discharge or skin changes noted.   Lungs:  Normal respiratory effort, chest expands symmetrically. Lungs are clear to auscultation, no crackles or wheezes. Heart:   Normal rate and regular rhythm. S1 and S2 normal without gallop, murmur, click, rub or other extra sounds. Abdomen:  Bowel sounds positive,abdomen soft and non-tender without masses, organomegaly or hernias noted. Msk:  No deformity or scoliosis noted of thoracic or lumbar spine.   Extremities:  No clubbing, cyanosis, edema, or deformity noted with normal full range of motion of all joints.   Neurologic:  alert & oriented X3 and gait normal.   Skin:  Intact without rashes Cervical Nodes:  No lymphadenopathy noted Axillary Nodes:  No palpable lymphadenopathy  Psych:  Cognition and judgment appear intact. Alert and cooperative with normal attention span and concentration. No apparent delusions, illusions, hallucinations     Assessment & Plan:

## 2015-09-13 ENCOUNTER — Other Ambulatory Visit: Payer: Self-pay | Admitting: Family Medicine

## 2015-10-10 ENCOUNTER — Ambulatory Visit (INDEPENDENT_AMBULATORY_CARE_PROVIDER_SITE_OTHER): Payer: Medicare Other

## 2015-10-10 DIAGNOSIS — Z23 Encounter for immunization: Secondary | ICD-10-CM

## 2015-11-09 ENCOUNTER — Other Ambulatory Visit: Payer: Self-pay | Admitting: Family Medicine

## 2015-11-10 ENCOUNTER — Ambulatory Visit (INDEPENDENT_AMBULATORY_CARE_PROVIDER_SITE_OTHER): Payer: Medicare Other | Admitting: Family Medicine

## 2015-11-10 ENCOUNTER — Encounter: Payer: Self-pay | Admitting: Family Medicine

## 2015-11-10 VITALS — BP 128/82 | HR 69 | Temp 97.8°F | Wt 194.8 lb

## 2015-11-10 DIAGNOSIS — J069 Acute upper respiratory infection, unspecified: Secondary | ICD-10-CM | POA: Diagnosis not present

## 2015-11-10 MED ORDER — AZITHROMYCIN 250 MG PO TABS: ORAL_TABLET | ORAL | 0 refills | Status: DC

## 2015-11-10 NOTE — Progress Notes (Signed)
Pre visit review using our clinic review tool, if applicable. No additional management support is needed unless otherwise documented below in the visit note. 

## 2015-11-10 NOTE — Progress Notes (Signed)
SUBJECTIVE:  [name] is a 75 y.o. female who complains of coryza, congestion and productive cough for 3 days. She denies a history of anorexia and chest pain and denies a history of asthma. Patient denies smoke cigarettes.   Current Outpatient Prescriptions on File Prior to Visit  Medication Sig Dispense Refill  . aspirin EC 81 MG tablet Take 81 mg by mouth daily.    [name] atenolol (TENORMIN) 50 MG tablet TAKE 1 TABLET (50 MG TOTAL) BY MOUTH DAILY. 90 tablet 3  . butalbital-acetaminophen-caffeine (FIORICET, ESGIC) 50-325-40 MG tablet TAKE ONE TABLET BY MOUTH TWICE A DAY AS NEEDED FOR HEADACHE 60 tablet 0  . Cholecalciferol (VITAMIN D-3) 1000 UNITS CAPS Take 1 capsule by mouth daily.    . fluocinonide-emollient (LIDEX-E) 0.05 % cream Apply 1 application topically 2 (two) times daily. 30 g 0  . ibuprofen (ADVIL,MOTRIN) 800 MG tablet TAKE 1 TABLET BY MOUTH EVERY 4 TO 6 HOURS AS NEEDED 180 tablet 1  . levothyroxine (SYNTHROID, LEVOTHROID) 100 MCG tablet TAKE 1 TABLET (100 MCG TOTAL) BY MOUTH DAILY BEFORE BREAKFAST. 90 tablet 2  . Multiple Vitamin (MULTIVITAMIN) tablet Take 1 tablet by mouth daily.      . simvastatin (ZOCOR) 40 MG tablet TAKE 1 TABLET BY MOUTH AT BEDTIME 90 tablet 1  . SUMAtriptan (IMITREX) 20 MG/ACT nasal spray Place 1 spray into the nose as needed.       No current facility-administered medications on file prior to visit.     Allergies  Allergen Reactions  . Codeine     REACTION: NAUSEA AND VOMITING  . Tramadol     Vomiting, LOC    Past Medical History:  Diagnosis Date  . Back pain   . Endometriosis   . Hypercholesteremia   . Hypothyroidism   . Migraine   . Venous stasis ulcer ([place]) 11/1997   Wound care center, [place]    Past Surgical History:  Procedure Laterality Date  . ABDOMINAL HYSTERECTOMY     1 1/2 ovaries gone, second to endometriosis  . APPENDECTOMY    . CYSTOSCOPY    . HEMORRHOID SURGERY    . HERNIA REPAIR    . ROTATOR CUFF REPAIR    .  THYROIDECTOMY      Family History  Problem Relation Age of Onset  . Stroke Mother   . Goiter Mother   . Stomach cancer Sister   . Heart attack Father   . Heart disease Father   . Heart attack Brother   . Kidney disease Brother   . Colon cancer Neg Hx   . Esophageal cancer Neg Hx     Social History   Social History  . Marital status: Married    Spouse name: N/A  . Number of children: 2  . Years of education: N/A   Occupational History  . Retired Retired   Social History Main Topics  . Smoking status: Never Smoker  . Smokeless tobacco: Not on file  . Alcohol use No  . Drug use: No  . Sexual activity: Not on file   Other Topics Concern  . Not on file   Social History Narrative   Lives with husband   Does not have living will.   Would desire CPR but would not want to be on life support for prolonged period of time if futile.         The PMH, PSH, Social History, Family History, Medications, and allergies have been reviewed in [facility], and have  been updated if relevant.  OBJECTIVE: BP 128/82   Pulse 69   Temp 97.8 F (36.6 C) (Oral)   Wt 194 lb 12 oz (88.3 kg)   SpO2 95%   BMI 29.61 kg/m   She appears well, vital signs are as noted. Ears normal.  Throat and pharynx normal.  Neck supple. No adenopathy in the neck. Nose is congested. Sinuses non tender. The chest is clear, without wheezes or rales.  ASSESSMENT:  viral upper respiratory illness  PLAN: Symptomatic therapy suggested: push fluids, rest and return office visit prn if symptoms persist or worsen. Lack of antibiotic effectiveness discussed with her. Call or return to clinic prn if these symptoms worsen or fail to improve as anticipated.

## 2015-11-10 NOTE — Patient Instructions (Signed)
Great to see you. This is likely a cold virus.   Try Delsym for cough.  Only fill your zpack if your symptoms are worsening or not improving over the next 7- 10 days.

## 2015-11-13 DIAGNOSIS — H353132 Nonexudative age-related macular degeneration, bilateral, intermediate dry stage: Secondary | ICD-10-CM | POA: Diagnosis not present

## 2015-11-13 DIAGNOSIS — H524 Presbyopia: Secondary | ICD-10-CM | POA: Diagnosis not present

## 2015-11-13 DIAGNOSIS — H47323 Drusen of optic disc, bilateral: Secondary | ICD-10-CM | POA: Diagnosis not present

## 2015-11-13 DIAGNOSIS — H43813 Vitreous degeneration, bilateral: Secondary | ICD-10-CM | POA: Diagnosis not present

## 2015-12-31 ENCOUNTER — Encounter: Payer: Self-pay | Admitting: Family Medicine

## 2015-12-31 ENCOUNTER — Ambulatory Visit (INDEPENDENT_AMBULATORY_CARE_PROVIDER_SITE_OTHER): Payer: Medicare Other | Admitting: Family Medicine

## 2015-12-31 DIAGNOSIS — R05 Cough: Secondary | ICD-10-CM | POA: Diagnosis not present

## 2015-12-31 DIAGNOSIS — R059 Cough, unspecified: Secondary | ICD-10-CM

## 2015-12-31 MED ORDER — AMOXICILLIN-POT CLAVULANATE 875-125 MG PO TABS: 1.0000 | ORAL_TABLET | Freq: Two times a day (BID) | ORAL | 0 refills | Status: DC

## 2015-12-31 MED ORDER — BENZONATATE 200 MG PO CAPS: 200.0000 mg | ORAL_CAPSULE | Freq: Three times a day (TID) | ORAL | 0 refills | Status: DC | PRN

## 2015-12-31 NOTE — Progress Notes (Signed)
"  I've had this for about 3 weeks."  Cough.  Post nasal gtt.  ST.  No fevers.  Some days are better than others.  Taking tylenol as needed.  Cough is worse at night.  Not coughing sputum up from her chest but is clearing her throat often.  No ear pain.    Meds, vitals, and allergies reviewed.   ROS: Per HPI unless specifically indicated in ROS section   GEN: nad, alert and oriented HEENT: mucous membranes moist, tm w/o erythema, nasal exam w/o erythema, clear discharge noted,  OP with cobblestoning NECK: supple w/o LA CV: rrr.   PULM: ctab, no inc wob EXT: no edema

## 2015-12-31 NOTE — Progress Notes (Signed)
Pre visit review using our clinic review tool, if applicable. No additional management support is needed unless otherwise documented below in the visit note. 

## 2015-12-31 NOTE — Patient Instructions (Addendum)
Start augmentin and use tessalon for cough.  Gargle with salt walter.  Take care.  Glad to see you.  Update [place] as needed.

## 2016-01-01 DIAGNOSIS — R05 Cough: Secondary | ICD-10-CM | POA: Insufficient documentation

## 2016-01-01 DIAGNOSIS — R059 Cough, unspecified: Secondary | ICD-10-CM | POA: Insufficient documentation

## 2016-01-01 DIAGNOSIS — R051 Acute cough: Secondary | ICD-10-CM | POA: Insufficient documentation

## 2016-01-01 NOTE — Assessment & Plan Note (Signed)
Nontoxic. See after visit summary. Given the duration, likely reasonable to treat at this point. Start Augmentin. Use Tessalon as needed. Follow-up as needed.

## 2016-01-14 ENCOUNTER — Encounter: Payer: Self-pay | Admitting: Internal Medicine

## 2016-01-14 ENCOUNTER — Ambulatory Visit (INDEPENDENT_AMBULATORY_CARE_PROVIDER_SITE_OTHER): Payer: Medicare Other | Admitting: Internal Medicine

## 2016-01-14 VITALS — BP 128/74 | HR 62 | Temp 97.6°F | Wt 194.0 lb

## 2016-01-14 DIAGNOSIS — B379 Candidiasis, unspecified: Secondary | ICD-10-CM | POA: Diagnosis not present

## 2016-01-14 DIAGNOSIS — T3695XA Adverse effect of unspecified systemic antibiotic, initial encounter: Secondary | ICD-10-CM | POA: Diagnosis not present

## 2016-01-14 MED ORDER — FLUCONAZOLE 150 MG PO TABS: ORAL_TABLET | ORAL | 0 refills | Status: DC

## 2016-01-14 NOTE — Progress Notes (Signed)
Subjective:    Patient ID: [name], female    DOB: 12-Apr-1940, 75 y.o.   MRN: [id number]  HPI  Pt presents to the clinic today with c/o vaginal discharge, burning and itching. This started 2-3 days ago. She has noticed some urinary frequency but denies urgency, dysuria, or blood in her urine. She is currently on Augmentin for a cough, and reports she has 2-3 days left of the abx. She has not taken anything OTC for this.  Review of Systems      Past Medical History:  Diagnosis Date  . Back pain   . Endometriosis   . Hypercholesteremia   . Hypothyroidism   . Migraine   . Venous stasis ulcer ([place]) 11/1997   Wound care center, [place]    Current Outpatient Prescriptions  Medication Sig Dispense Refill  . aspirin EC 81 MG tablet Take 81 mg by mouth daily.    [name] atenolol (TENORMIN) 50 MG tablet TAKE 1 TABLET (50 MG TOTAL) BY MOUTH DAILY. 90 tablet 3  . benzonatate (TESSALON) 200 MG capsule Take 1 capsule (200 mg total) by mouth 3 (three) times daily as needed for cough. 30 capsule 0  . butalbital-acetaminophen-caffeine (FIORICET, ESGIC) 50-325-40 MG tablet TAKE ONE TABLET BY MOUTH TWICE A DAY AS NEEDED FOR HEADACHE 60 tablet 0  . Cholecalciferol (VITAMIN D-3) 1000 UNITS CAPS Take 1 capsule by mouth daily.    [name] ibuprofen (ADVIL,MOTRIN) 800 MG tablet TAKE 1 TABLET BY MOUTH EVERY 4 TO 6 HOURS AS NEEDED 180 tablet 1  . levothyroxine (SYNTHROID, LEVOTHROID) 100 MCG tablet TAKE 1 TABLET (100 MCG TOTAL) BY MOUTH DAILY BEFORE BREAKFAST. 90 tablet 2  . Multiple Vitamin (MULTIVITAMIN) tablet Take 1 tablet by mouth daily.      . simvastatin (ZOCOR) 40 MG tablet TAKE 1 TABLET BY MOUTH AT BEDTIME 90 tablet 1  . SUMAtriptan (IMITREX) 20 MG/ACT nasal spray Place 1 spray into the nose as needed.       No current facility-administered medications for this visit.     Allergies  Allergen Reactions  . Codeine     REACTION: NAUSEA AND VOMITING  . Tramadol     Vomiting, LOC    Family  History  Problem Relation Age of Onset  . Stroke Mother   . Goiter Mother   . Stomach cancer Sister   . Heart attack Father   . Heart disease Father   . Heart attack Brother   . Kidney disease Brother   . Colon cancer Neg Hx   . Esophageal cancer Neg Hx     Social History   Social History  . Marital status: Married    Spouse name: N/A  . Number of children: 2  . Years of education: N/A   Occupational History  . Retired Retired   Social History Main Topics  . Smoking status: Never Smoker  . Smokeless tobacco: Never Used  . Alcohol use No  . Drug use: No  . Sexual activity: Not on file   Other Topics Concern  . Not on file   Social History Narrative   Lives with husband   Does not have living will.   Would desire CPR but would not want to be on life support for prolonged period of time if futile.           Constitutional: Denies fever, malaise, fatigue, headache or abrupt weight changes.  Gastrointestinal: Denies abdominal pain, bloating, constipation, diarrhea or blood in the stool.  GU: Pt reports vaginal discharge, itching, burning, and urinary frequency. Denies urgency, pain with urination, blood in urine, odor.  No other specific complaints in a complete review of systems (except as listed in HPI above).  Objective:   Physical Exam  BP 128/74   Pulse 62   Temp 97.6 F (36.4 C) (Oral)   Wt 194 lb (88 kg)   SpO2 98%   BMI 29.50 kg/m  Wt Readings from Last 3 Encounters:  01/14/16 194 lb (88 kg)  12/31/15 193 lb (87.5 kg)  11/10/15 194 lb 12 oz (88.3 kg)    General: Appears her stated age, in NAD. Abdomen: Soft and nontender. Normal bowel sounds. No distention or masses noted.    BMET    Component Value Date/Time   NA 140 09/03/2015 0755   NA 138 08/22/2011 1737   K 4.1 09/03/2015 0755   K 3.9 08/22/2011 1737   CL 104 09/03/2015 0755   CL 104 08/22/2011 1737   CO2 29 09/03/2015 0755   CO2 25 08/22/2011 1737   GLUCOSE 103 (H) 09/03/2015  0755   GLUCOSE 119 (H) 08/22/2011 1737   BUN 13 09/03/2015 0755   BUN 15 08/22/2011 1737   CREATININE 0.67 09/03/2015 0755   CREATININE 0.75 08/22/2011 1737   CALCIUM 9.3 09/03/2015 0755   CALCIUM 8.5 08/22/2011 1737   GFRNONAA >60 07/29/2015 2125   GFRNONAA >60 08/22/2011 1737   GFRAA >60 07/29/2015 2125   GFRAA >60 08/22/2011 1737    Lipid Panel     Component Value Date/Time   CHOL 171 09/03/2015 0755   TRIG 162.0 (H) 09/03/2015 0755   HDL 45.80 09/03/2015 0755   CHOLHDL 4 09/03/2015 0755   VLDL 32.4 09/03/2015 0755   LDLCALC 93 09/03/2015 0755    CBC    Component Value Date/Time   WBC 5.9 09/03/2015 0755   RBC 3.93 09/03/2015 0755   HGB 12.6 09/03/2015 0755   HGB 13.0 08/22/2011 1737   HCT 36.9 09/03/2015 0755   HCT 38.7 08/22/2011 1737   PLT 245.0 09/03/2015 0755   PLT 242 08/22/2011 1737   MCV 93.9 09/03/2015 0755   MCV 97 08/22/2011 1737   MCH 32.9 07/29/2015 2125   MCHC 34.0 09/03/2015 0755   RDW 13.3 09/03/2015 0755   RDW 12.6 08/22/2011 1737   LYMPHSABS 1.6 09/03/2015 0755   LYMPHSABS 1.9 08/22/2011 1737   MONOABS 0.6 09/03/2015 0755   MONOABS 0.8 08/22/2011 1737   EOSABS 0.3 09/03/2015 0755   EOSABS 0.3 08/22/2011 1737   BASOSABS 0.0 09/03/2015 0755   BASOSABS 0.2 (H) 08/22/2011 1737    Hgb A1C Lab Results  Component Value Date   HGBA1C 5.9 09/05/2014        Assessment & Plan:   Antibiotic induced yeast infection:  Still on Augmentin Urinalysis: trace blood eRx for Diflucan 150 mg PO today, repeat in 3 days  RTC as needed or if symptoms persist or worsen Hadlee Burback, NP

## 2016-01-14 NOTE — Patient Instructions (Signed)

## 2016-03-03 DIAGNOSIS — L821 Other seborrheic keratosis: Secondary | ICD-10-CM | POA: Diagnosis not present

## 2016-03-03 DIAGNOSIS — D1801 Hemangioma of skin and subcutaneous tissue: Secondary | ICD-10-CM | POA: Diagnosis not present

## 2016-03-03 DIAGNOSIS — Z85828 Personal history of other malignant neoplasm of skin: Secondary | ICD-10-CM | POA: Diagnosis not present

## 2016-03-03 DIAGNOSIS — L720 Epidermal cyst: Secondary | ICD-10-CM | POA: Diagnosis not present

## 2016-05-05 DIAGNOSIS — Z1231 Encounter for screening mammogram for malignant neoplasm of breast: Secondary | ICD-10-CM | POA: Diagnosis not present

## 2016-05-06 ENCOUNTER — Encounter: Payer: Self-pay | Admitting: Family Medicine

## 2016-05-06 DIAGNOSIS — H35373 Puckering of macula, bilateral: Secondary | ICD-10-CM | POA: Diagnosis not present

## 2016-05-06 DIAGNOSIS — H47323 Drusen of optic disc, bilateral: Secondary | ICD-10-CM | POA: Diagnosis not present

## 2016-05-06 DIAGNOSIS — H353132 Nonexudative age-related macular degeneration, bilateral, intermediate dry stage: Secondary | ICD-10-CM | POA: Diagnosis not present

## 2016-05-09 ENCOUNTER — Other Ambulatory Visit: Payer: Self-pay | Admitting: Family Medicine

## 2016-06-02 ENCOUNTER — Telehealth: Payer: Self-pay | Admitting: Family Medicine

## 2016-06-02 NOTE — Telephone Encounter (Signed)
PLEASE NOTE: All timestamps contained within this report are represented as [place] Standard Time. CONFIDENTIALTY NOTICE: This fax transmission is intended only for the addressee. It contains information that is legally privileged, confidential or otherwise protected from use or disclosure. If you are not the intended recipient, you are strictly prohibited from reviewing, disclosing, copying using or disseminating any of this information or taking any action in reliance on or regarding this information. If you have received this fax in error, please notify us immediately by telephone so that we can arrange for its return to [place]. Phone: [phone], Toll-Free: [phone], Fax: [phone] Page: 1 of 2 Call Id: [id number] [facility] Patient Name: [name] Gender: Female DOB: 11-18-1940 Age: 76 Y 57 M 28 D Return Phone Number: [phone] (Primary) City/State/Zip: [place] Client [facility] - Client Client Site [place] - Day Physician Arnette Norris - MD Who Is Calling Patient / Member / Family / Caregiver Call Type Triage / Clinical Relationship To Patient Self Return Phone Number [phone] (Primary) Chief Complaint Cough Reason for Call Symptomatic / Request for [place] has a bad cough, is taking mucinex Appointment Disposition EMR Appointment Not Necessary Info pasted into Epic Yes Nurse Assessment Nurse: Raphael Gibney, RN, Vanita Ingles Date/Time (Eastern Time): 06/02/2016 9:37:29 AM Confirm and document reason for call. If symptomatic, describe symptoms. ---Caller states she has a hacking cough for the past 2 days. She is taking mucinex. She has been taking benzonatate also. cough is productive at times. No fever. Does the PT have any chronic conditions? (i.e. diabetes, asthma, etc.) ---No Guidelines Guideline Title  Affirmed Question Cough - Acute Non- Productive Cough Disp. Time Eilene Ghazi Time) Disposition Final User 06/02/2016 9:47:09 AM Home Care Yes Stringer, RN, [facility] Advice Given Per Guideline REASSURANCE: * Coughing is the way that our lungs remove irritants and mucus. It helps protect our lungs from getting pneumonia. * You can get a dry hacking cough after a chest cold. Sometimes this type of cough can last 1-3 weeks, and be worse at night. * HOME REMEDY - HONEY: This old home remedy has been shown to help decrease coughing at night. The adult dosage is 2 teaspoons (10 ml) at bedtime. Honey should not be given to infants under one year of age. COUGHING SPELLS: * Drink warm fluids. Inhale warm mist. (Reason: both relax the airway and loosen up the phlegm) * Suck on cough drops or hard candy to coat the irritated throat. CALL BACK IF: * Continuous coughing persists over 2 hours after cough treatment * Difficulty breathing occurs * You become worse. * Cough lasts over 3 weeks COUGH DROPS FOR COUGH: * Cough drops can help a lot, especially for mild coughs. They reduce coughing by soothing your irritated throat and removing that tickle sensation in the back of the throat. Comments User: Dannielle Burn, RN Date/Time Eilene Ghazi Time): 06/02/2016 9:48:55 AM advised caller as per drugs.com that it is ok to take benzonatate and mucinex together. Verbalized understanding. PLEASE NOTE: All timestamps contained within this report are represented as [place] Standard Time. CONFIDENTIALTY NOTICE: This fax transmission is intended only for the addressee. It contains information that is legally privileged, confidential or otherwise protected from use or disclosure. If you are not the intended recipient, you are strictly prohibited from reviewing, disclosing, copying using or disseminating any of this information or taking any action in reliance  on or regarding this information. If you have received this fax in error,  please notify us immediately by telephone so that we can arrange for its return to [place]. Phone: [phone], Toll-Free: [phone], Fax: [phone] Page: 2 of 2 Call Id: [id number] Comments User: Dannielle Burn, RN Date/Time Eilene Ghazi Time): 06/02/2016 9:49:34 AM pt would like a refill on her benzonatate. Please call pt back regarding medication

## 2016-06-02 NOTE — Telephone Encounter (Signed)
Patient Name: [name]  DOB: 1941-01-08    Initial Comment Caller has a bad cough, is taking mucinex    Nurse Assessment  Nurse: Raphael Gibney, RN, [place] Date/Time (Eastern Time): 06/02/2016 9:37:29 AM  Confirm and document reason for call. If symptomatic, describe symptoms. ---Caller states she has a hacking cough for the past 2 days. She is taking mucinex. She has been taking benzonatate also. cough is productive at times. No fever.  Does the patient have any new or worsening symptoms? ---Yes  Will a triage be completed? ---Yes  Related visit to physician within the last 2 weeks? ---No  Does the PT have any chronic conditions? (i.e. diabetes, asthma, etc.) ---No  Is this a behavioral health or substance abuse call? ---No     Guidelines    Guideline Title Affirmed Question Affirmed Notes  Cough - Acute Non-Productive Cough    Final Disposition User   [facility] [place], RN, Vanita Ingles    Comments  advised caller as per drugs.com that it is ok to take benzonatate and mucinex together. Verbalized understanding.  pt would like a refill on her benzonatate. Please call pt back regarding medication   Disagree/Comply: Comply

## 2016-06-03 ENCOUNTER — Other Ambulatory Visit: Payer: Self-pay | Admitting: Family Medicine

## 2016-06-03 NOTE — Telephone Encounter (Signed)
Electronic refill request. Last office visit:   12/31/15 Last Filled:     30 capsule 0 12/31/2015   Please advise.

## 2016-06-24 ENCOUNTER — Other Ambulatory Visit: Payer: Self-pay | Admitting: Family Medicine

## 2016-07-20 ENCOUNTER — Other Ambulatory Visit: Payer: Self-pay | Admitting: Family Medicine

## 2016-07-22 NOTE — Telephone Encounter (Signed)
Last refill 05/15/15 #180 +1  Last OV 09/10/15 Ok to refill?

## 2016-08-18 ENCOUNTER — Encounter: Payer: Self-pay | Admitting: Primary Care

## 2016-08-18 ENCOUNTER — Ambulatory Visit (INDEPENDENT_AMBULATORY_CARE_PROVIDER_SITE_OTHER): Payer: Medicare Other | Admitting: Primary Care

## 2016-08-18 VITALS — BP 126/78 | HR 68 | Temp 97.8°F | Wt 190.4 lb

## 2016-08-18 DIAGNOSIS — J069 Acute upper respiratory infection, unspecified: Secondary | ICD-10-CM

## 2016-08-18 MED ORDER — BENZONATATE 200 MG PO CAPS: 200.0000 mg | ORAL_CAPSULE | Freq: Three times a day (TID) | ORAL | 0 refills | Status: DC | PRN

## 2016-08-18 NOTE — Patient Instructions (Signed)
Your symptoms are representative of a viral illness which will resolve on its own over time. Our goal is to treat your symptoms in order to aid your body in the healing process and to make you more comfortable.   You may take Benzonatate capsules for cough. Take 1 capsule by mouth three times daily as needed for cough.  Cough/Congestion: Try taking Mucinex. This will help loosen up the mucous in your chest. Ensure you take this medication with a full glass of water.  Ensure you are staying hydrated with water and rest.  It was a pleasure meeting you!   Upper Respiratory Infection, Adult Most upper respiratory infections (URIs) are a viral infection of the air passages leading to the lungs. A URI affects the nose, throat, and upper air passages. The most common type of URI is nasopharyngitis and is typically referred to as "the common cold." URIs run their course and usually go away on their own. Most of the time, a URI does not require medical attention, but sometimes a bacterial infection in the upper airways can follow a viral infection. This is called a secondary infection. Sinus and middle ear infections are common types of secondary upper respiratory infections. Bacterial pneumonia can also complicate a URI. A URI can worsen asthma and chronic obstructive pulmonary disease (COPD). Sometimes, these complications can require emergency medical care and may be life threatening. What are the causes? Almost all URIs are caused by viruses. A virus is a type of germ and can spread from one person to another. What increases the risk? You may be at risk for a URI if:  You smoke.  You have chronic heart or lung disease.  You have a weakened defense (immune) system.  You are very young or very old.  You have nasal allergies or asthma.  You work in crowded or poorly ventilated areas.  You work in health care facilities or schools.  What are the signs or symptoms? Symptoms typically develop  2-3 days after you come in contact with a cold virus. Most viral URIs last 7-10 days. However, viral URIs from the influenza virus (flu virus) can last 14-18 days and are typically more severe. Symptoms may include:  Runny or stuffy (congested) nose.  Sneezing.  Cough.  Sore throat.  Headache.  Fatigue.  Fever.  Loss of appetite.  Pain in your forehead, behind your eyes, and over your cheekbones (sinus pain).  Muscle aches.  How is this diagnosed? Your health care provider may diagnose a URI by:  Physical exam.  Tests to check that your symptoms are not due to another condition such as: ? Strep throat. ? Sinusitis. ? Pneumonia. ? Asthma.  How is this treated? A URI goes away on its own with time. It cannot be cured with medicines, but medicines may be prescribed or recommended to relieve symptoms. Medicines may help:  Reduce your fever.  Reduce your cough.  Relieve nasal congestion.  Follow these instructions at home:  Take medicines only as directed by your health care provider.  Gargle warm saltwater or take cough drops to comfort your throat as directed by your health care provider.  Use a warm mist humidifier or inhale steam from a shower to increase air moisture. This may make it easier to breathe.  Drink enough fluid to keep your urine clear or pale yellow.  Eat soups and other clear broths and maintain good nutrition.  Rest as needed.  Return to work when your temperature has returned  to normal or as your health care provider advises. You may need to stay home longer to avoid infecting others. You can also use a face mask and careful hand washing to prevent spread of the virus.  Increase the usage of your inhaler if you have asthma.  Do not use any tobacco products, including cigarettes, chewing tobacco, or electronic cigarettes. If you need help quitting, ask your health care provider. How is this prevented? The best way to protect yourself from  getting a cold is to practice good hygiene.  Avoid oral or hand contact with people with cold symptoms.  Wash your hands often if contact occurs.  There is no clear evidence that vitamin C, vitamin E, echinacea, or exercise reduces the chance of developing a cold. However, it is always recommended to get plenty of rest, exercise, and practice good nutrition. Contact a health care provider if:  You are getting worse rather than better.  Your symptoms are not controlled by medicine.  You have chills.  You have worsening shortness of breath.  You have brown or red mucus.  You have yellow or brown nasal discharge.  You have pain in your face, especially when you bend forward.  You have a fever.  You have swollen neck glands.  You have pain while swallowing.  You have white areas in the back of your throat. Get help right away if:  You have severe or persistent: ? Headache. ? Ear pain. ? Sinus pain. ? Chest pain.  You have chronic lung disease and any of the following: ? Wheezing. ? Prolonged cough. ? Coughing up blood. ? A change in your usual mucus.  You have a stiff neck.  You have changes in your: ? Vision. ? Hearing. ? Thinking. ? Mood. This information is not intended to replace advice given to you by your health care provider. Make sure you discuss any questions you have with your health care provider. Document Released: 06/30/2000 Document Revised: 09/07/2015 Document Reviewed: 04/11/2013 Elsevier Interactive Patient Education  2017 [employer].

## 2016-08-18 NOTE — Progress Notes (Signed)
Subjective:    Patient ID: [name], female    DOB: [birth year], 76 y.o.   MRN: [id number]  HPI  Ms. Wiegert is a 76 year old female with a history of hypertension who presents today with a chief complaint of cough. She also reports nasal congestion, chest congestion. Her cough is productive with greenish/yellow sputum. Her symptoms began 2-3 days ago. She denies fevers. She's benzonatate capsules from her husband's Rx with improvement.   Review of Systems  Constitutional: Negative for chills and fever.  HENT: Positive for congestion. Negative for sinus pressure and sore throat.   Respiratory: Negative for shortness of breath.   Cardiovascular: Negative for chest pain.  Musculoskeletal: Negative for myalgias.       Past Medical History:  Diagnosis Date  . Back pain   . Endometriosis   . Hypercholesteremia   . Hypothyroidism   . Migraine   . Venous stasis ulcer ([place]) 11/1997   Wound care center, [place]     Social History   Social History  . Marital status: Married    Spouse name: N/A  . Number of children: 2  . Years of education: N/A   Occupational History  . Retired Retired   Social History Main Topics  . Smoking status: Never Smoker  . Smokeless tobacco: Never Used  . Alcohol use No  . Drug use: No  . Sexual activity: Not on file   Other Topics Concern  . Not on file   Social History Narrative   Lives with husband   Does not have living will.   Would desire CPR but would not want to be on life support for prolonged period of time if futile.          Past Surgical History:  Procedure Laterality Date  . ABDOMINAL HYSTERECTOMY     1 1/2 ovaries gone, second to endometriosis  . APPENDECTOMY    . CYSTOSCOPY    . HEMORRHOID SURGERY    . HERNIA REPAIR    . ROTATOR CUFF REPAIR    . THYROIDECTOMY      Family History  Problem Relation Age of Onset  . Stroke Mother   . Goiter Mother   . Stomach cancer Sister   . Heart attack Father   .  Heart disease Father   . Heart attack Brother   . Kidney disease Brother   . Colon cancer Neg Hx   . Esophageal cancer Neg Hx     Allergies  Allergen Reactions  . Codeine     REACTION: NAUSEA AND VOMITING  . Tramadol     Vomiting, LOC    Current Outpatient Prescriptions on File Prior to Visit  Medication Sig Dispense Refill  . aspirin EC 81 MG tablet Take 81 mg by mouth daily.    [name] atenolol (TENORMIN) 50 MG tablet TAKE 1 TABLET (50 MG TOTAL) BY MOUTH DAILY. 90 tablet 3  . butalbital-acetaminophen-caffeine (FIORICET, ESGIC) 50-325-40 MG tablet TAKE ONE TABLET BY MOUTH TWICE A DAY AS NEEDED FOR HEADACHE 60 tablet 0  . Cholecalciferol (VITAMIN D-3) 1000 UNITS CAPS Take 1 capsule by mouth daily.    . fluconazole (DIFLUCAN) 150 MG tablet Take 1 today, then 1 tab on Sunday 2 tablet 0  . ibuprofen (ADVIL,MOTRIN) 800 MG tablet TAKE 1 TABLET BY MOUTH EVERY 4 TO 6 HOURS AS NEEDED 180 tablet 0  . levothyroxine (SYNTHROID, LEVOTHROID) 100 MCG tablet TAKE 1 TABLET (100 MCG TOTAL) BY MOUTH DAILY BEFORE BREAKFAST.  90 tablet 0  . Multiple Vitamin (MULTIVITAMIN) tablet Take 1 tablet by mouth daily.      . simvastatin (ZOCOR) 40 MG tablet TAKE 1 TABLET BY MOUTH AT BEDTIME 90 tablet 1  . SUMAtriptan (IMITREX) 20 MG/ACT nasal spray Place 1 spray into the nose as needed.      . benzonatate (TESSALON) 200 MG capsule TAKE 1 CAPSULE (200 MG TOTAL) BY MOUTH 3 (THREE) TIMES DAILY AS NEEDED FOR COUGH. (Patient not taking: Reported on 08/18/2016) 30 capsule 0   No current facility-administered medications on file prior to visit.     BP 126/78   Pulse 68   Temp 97.8 F (36.6 C) (Oral)   Wt 190 lb 6.4 oz (86.4 kg)   SpO2 97%   BMI 28.95 kg/m    Objective:   Physical Exam  Constitutional: She appears well-nourished. She does not appear ill.  HENT:  Right Ear: Tympanic membrane and ear canal normal.  Left Ear: Tympanic membrane and ear canal normal.  Nose: Mucosal edema present. Right sinus exhibits no  maxillary sinus tenderness and no frontal sinus tenderness. Left sinus exhibits no maxillary sinus tenderness and no frontal sinus tenderness.  Mouth/Throat: Oropharynx is clear and moist.  Eyes: Conjunctivae are normal.  Neck: Neck supple.  Cardiovascular: Normal rate and regular rhythm.   Pulmonary/Chest: Effort normal and breath sounds normal. She has no wheezes. She has no rales.  Lymphadenopathy:    She has no cervical adenopathy.  Skin: Skin is warm and dry.          Assessment & Plan:  URI vs Allergic Rhinitis  Cough, congestion (chest and nasal) x 2-3 days. Overall improved with husband's benzonatate capsules. Exam today with clear lungs, no evidence of pneumonia. Suspect viral vs allergy involvement and will treat with conservative measures. Rx for benzonatate capsules sent to pharmacy. Discussed use of Mucinex. Fluids, rest, follow up PRN.  Sheral Flow, NP

## 2016-08-23 ENCOUNTER — Telehealth: Payer: Self-pay

## 2016-08-23 DIAGNOSIS — J069 Acute upper respiratory infection, unspecified: Secondary | ICD-10-CM

## 2016-08-23 MED ORDER — AMOXICILLIN 875 MG PO TABS: 875.0000 mg | ORAL_TABLET | Freq: Two times a day (BID) | ORAL | 0 refills | Status: DC

## 2016-08-23 NOTE — Telephone Encounter (Signed)
Pt was seen on 08/18/16; pt has been taking benzonatate for cough and not helping the cough. Pt has a prod cough with green phlegm; with slight wheezing.no fever or S/T and no SOB.Pt request abx. [employer]. Pt request cb.

## 2016-08-23 NOTE — Telephone Encounter (Signed)
Noted, appreciate the update. She likely needs antibiotics at this point if no improvement. Start amoxicillin antibiotics. Take 1 tablet by mouth twice daily for 7 days.

## 2016-08-23 NOTE — Telephone Encounter (Signed)
Spoken and notified patient of Kate's comments. Patient verbalized understanding. 

## 2016-09-05 ENCOUNTER — Other Ambulatory Visit: Payer: Self-pay | Admitting: Family Medicine

## 2016-09-09 ENCOUNTER — Other Ambulatory Visit: Payer: Self-pay | Admitting: Family Medicine

## 2016-09-09 DIAGNOSIS — E890 Postprocedural hypothyroidism: Secondary | ICD-10-CM

## 2016-09-09 DIAGNOSIS — E785 Hyperlipidemia, unspecified: Secondary | ICD-10-CM

## 2016-09-09 DIAGNOSIS — I10 Essential (primary) hypertension: Secondary | ICD-10-CM

## 2016-09-09 DIAGNOSIS — E559 Vitamin D deficiency, unspecified: Secondary | ICD-10-CM

## 2016-09-13 ENCOUNTER — Ambulatory Visit (INDEPENDENT_AMBULATORY_CARE_PROVIDER_SITE_OTHER): Payer: Medicare Other

## 2016-09-13 VITALS — BP 124/70 | HR 71 | Temp 97.6°F | Ht 68.0 in | Wt 184.8 lb

## 2016-09-13 DIAGNOSIS — E785 Hyperlipidemia, unspecified: Secondary | ICD-10-CM | POA: Diagnosis not present

## 2016-09-13 DIAGNOSIS — Z0000 Encounter for general adult medical examination without abnormal findings: Secondary | ICD-10-CM | POA: Diagnosis not present

## 2016-09-13 DIAGNOSIS — E890 Postprocedural hypothyroidism: Secondary | ICD-10-CM

## 2016-09-13 LAB — COMPREHENSIVE METABOLIC PANEL
ALT: 32 U/L (ref 0–35)
AST: 33 U/L (ref 0–37)
Albumin: 4 g/dL (ref 3.5–5.2)
Alkaline Phosphatase: 81 U/L (ref 39–117)
BUN: 13 mg/dL (ref 6–23)
CALCIUM: 9 mg/dL (ref 8.4–10.5)
CHLORIDE: 106 meq/L (ref 96–112)
CO2: 28 meq/L (ref 19–32)
CREATININE: 0.66 mg/dL (ref 0.40–1.20)
GFR: 92.56 mL/min (ref >60.00)
GLUCOSE: 107 mg/dL — AB (ref 70–99)
Potassium: 4.1 mEq/L (ref 3.5–5.1)
SODIUM: 140 meq/L (ref 135–145)
Total Bilirubin: 0.6 mg/dL (ref 0.2–1.2)
Total Protein: 6.9 g/dL (ref 6.0–8.3)

## 2016-09-13 LAB — T4, FREE: FREE T4: 0.95 ng/dL (ref 0.60–1.60)

## 2016-09-13 LAB — LIPID PANEL
CHOL/HDL RATIO: 4
Cholesterol: 142 mg/dL (ref 0–200)
HDL: 39.8 mg/dL (ref >39.00)
LDL CALC: 79 mg/dL (ref 0–99)
NONHDL: 102.62
TRIGLYCERIDES: 117 mg/dL (ref 0.0–149.0)
VLDL: 23.4 mg/dL (ref 0.0–40.0)

## 2016-09-13 LAB — TSH: TSH: 0.32 u[IU]/mL — AB (ref 0.35–4.50)

## 2016-09-13 NOTE — Progress Notes (Signed)
Pre visit review using our clinic review tool, if applicable. No additional management support is needed unless otherwise documented below in the visit note. 

## 2016-09-13 NOTE — Progress Notes (Signed)
I reviewed health advisor's note, was available for consultation, and agree with documentation and plan.  

## 2016-09-13 NOTE — Patient Instructions (Signed)
[name] , Thank you for taking time to come for your Medicare Wellness Visit. I appreciate your ongoing commitment to your health goals. Please review the following plan we discussed and let me know if I can assist you in the future.   These are the goals we discussed: Goals    . Increase water intake          Starting 09/13/2016, I will attempt to drink at [facility] of water with each meal.        This is a list of the screening recommended for you and due dates:  Health Maintenance  Topic Date Due  . Flu Shot  04/17/2017*  . DEXA scan (bone density measurement)  09/13/2048*  . Colon Cancer Screening  10/19/2020  . Tetanus Vaccine  01/19/2023  . Pneumonia vaccines  Completed  *Topic was postponed. The date shown is not the original due date.   Preventive Care for Adults  A healthy lifestyle and preventive care can promote health and wellness. Preventive health guidelines for adults include the following key practices.  . A routine yearly physical is a good way to check with your health care provider about your health and preventive screening. It is a chance to share any concerns and updates on your health and to receive a thorough exam.  . Visit your dentist for a routine exam and preventive care every 6 months. Brush your teeth twice a day and floss once a day. Good oral hygiene prevents tooth decay and gum disease.  . The frequency of eye exams is based on your age, health, family medical history, use  of contact lenses, and other factors. Follow your health care provider's ecommendations for frequency of eye exams.  . Eat a healthy diet. Foods like vegetables, fruits, whole grains, low-fat dairy products, and lean protein foods contain the nutrients you need without too many calories. Decrease your intake of foods high in solid fats, added sugars, and salt. Eat the right amount of calories for you. Get information about a proper diet from your health care provider, if  necessary.  . Regular physical exercise is one of the most important things you can do for your health. Most adults should get at least 150 minutes of moderate-intensity exercise (any activity that increases your heart rate and causes you to sweat) each week. In addition, most adults need muscle-strengthening exercises on 2 or more days a week.  Silver Sneakers may be a benefit available to you. To determine eligibility, you may visit the website: www.silversneakers.com or contact program at [phone] Mon-Fri between 8AM-8PM.   . Maintain a healthy weight. The body mass index (BMI) is a screening tool to identify possible weight problems. It provides an estimate of body fat based on height and weight. Your health care provider can find your BMI and can help you achieve or maintain a healthy weight.   For adults 20 years and older: ? A BMI below 18.5 is considered underweight. ? A BMI of 18.5 to 24.9 is normal. ? A BMI of 25 to 29.9 is considered overweight. ? A BMI of 30 and above is considered obese.   . Maintain normal blood lipids and cholesterol levels by exercising and minimizing your intake of saturated fat. Eat a balanced diet with plenty of fruit and vegetables. Blood tests for lipids and cholesterol should begin at age 56 and be repeated every 5 years. If your lipid or cholesterol levels are high, you are over 50, or you are  at high risk for heart disease, you may need your cholesterol levels checked more frequently. Ongoing high lipid and cholesterol levels should be treated with medicines if diet and exercise are not working.  . If you smoke, find out from your health care provider how to quit. If you do not use tobacco, please do not start.  . If you choose to drink alcohol, please do not consume more than 2 drinks per day. One drink is considered to be 12 ounces (355 mL) of beer, 5 ounces (148 mL) of wine, or 1.5 ounces (44 mL) of liquor.  . If you are 62-1 years old, ask your  health care provider if you should take aspirin to prevent strokes.  . Use sunscreen. Apply sunscreen liberally and repeatedly throughout the day. You should seek shade when your shadow is shorter than you. Protect yourself by wearing long sleeves, pants, a wide-brimmed hat, and sunglasses year round, whenever you are outdoors.  . Once a month, do a whole body skin exam, using a mirror to look at the skin on your back. Tell your health care provider of new moles, moles that have irregular borders, moles that are larger than a pencil eraser, or moles that have changed in shape or color.

## 2016-09-13 NOTE — Progress Notes (Signed)
PCP notes:   Health maintenance:  Flu vaccine - addressed  Abnormal screenings:   Hearing - failed Depression score: 2  Patient concerns:   None  Nurse concerns:  None  Next PCP appt:   09/15/16 @ 8550

## 2016-09-13 NOTE — Progress Notes (Signed)
Subjective:   [name] is a 76 y.o. female who presents for Medicare Annual (Subsequent) preventive examination.  Review of Systems:  N/A Cardiac Risk Factors include: advanced age (>59men, >15 women);hypertension;dyslipidemia     Objective:     Vitals: BP 124/70 (BP Location: Right Arm, Patient Position: Sitting, Cuff Size: Normal)   Pulse 71   Temp 97.6 F (36.4 C) (Oral)   Ht 5\' 8"  (1.727 m) Comment: no shoes  Wt 184 lb 12 oz (83.8 kg)   SpO2 98%   BMI 28.09 kg/m   Body mass index is 28.09 kg/m.   Tobacco History  Smoking Status  . Never Smoker  Smokeless Tobacco  . Never Used     Counseling given: No   Past Medical History:  Diagnosis Date  . Back pain   . Endometriosis   . Hypercholesteremia   . Hypothyroidism   . Migraine   . Venous stasis ulcer ([place]) 11/1997   Wound care center, [place]   Past Surgical History:  Procedure Laterality Date  . ABDOMINAL HYSTERECTOMY     1 1/2 ovaries gone, second to endometriosis  . APPENDECTOMY    . CYSTOSCOPY    . HEMORRHOID SURGERY    . HERNIA REPAIR    . ROTATOR CUFF REPAIR    . THYROIDECTOMY     Family History  Problem Relation Age of Onset  . Stroke Mother   . Goiter Mother   . Stomach cancer Sister   . Heart attack Father   . Heart disease Father   . Heart attack Brother   . Kidney disease Brother   . Colon cancer Neg Hx   . Esophageal cancer Neg Hx    History  Sexual Activity  . Sexual activity: Not on file    Outpatient Encounter Prescriptions as of 09/13/2016  Medication Sig  . aspirin EC 81 MG tablet Take 81 mg by mouth daily.  [name] atenolol (TENORMIN) 50 MG tablet TAKE 1 TABLET (50 MG TOTAL) BY MOUTH DAILY.  . benzonatate (TESSALON) 200 MG capsule Take 1 capsule (200 mg total) by mouth 3 (three) times daily as needed for cough.  . butalbital-acetaminophen-caffeine (FIORICET, ESGIC) 50-325-40 MG tablet TAKE ONE TABLET BY MOUTH TWICE A DAY AS NEEDED FOR HEADACHE  . Cholecalciferol  (VITAMIN D-3) 1000 UNITS CAPS Take 1 capsule by mouth daily.  . fluconazole (DIFLUCAN) 150 MG tablet Take 1 today, then 1 tab on Sunday  . ibuprofen (ADVIL,MOTRIN) 800 MG tablet TAKE 1 TABLET BY MOUTH EVERY 4 TO 6 HOURS AS NEEDED  . levothyroxine (SYNTHROID, LEVOTHROID) 100 MCG tablet TAKE 1 TABLET (100 MCG TOTAL) BY MOUTH DAILY BEFORE BREAKFAST.  . Multiple Vitamin (MULTIVITAMIN) tablet Take 1 tablet by mouth daily.    . simvastatin (ZOCOR) 40 MG tablet TAKE 1 TABLET BY MOUTH AT BEDTIME  . SUMAtriptan (IMITREX) 20 MG/ACT nasal spray Place 1 spray into the nose as needed.    . [DISCONTINUED] amoxicillin (AMOXIL) 875 MG tablet Take 1 tablet (875 mg total) by mouth 2 (two) times daily.   No facility-administered encounter medications on file as of 09/13/2016.     Activities of Daily Living In your present state of health, do you have any difficulty performing the following activities: 09/13/2016  Hearing? N  Vision? N  Difficulty concentrating or making decisions? N  Walking or climbing stairs? N  Dressing or bathing? N  Doing errands, shopping? N  Preparing Food and eating ? N  Using the Toilet?  N  In the past six months, have you accidently leaked urine? N  Do you have problems with loss of bowel control? N  Managing your Medications? N  Managing your Finances? N  Housekeeping or managing your Housekeeping? N  Some recent data might be hidden    Patient Care Team: Lucille Passy, MD as PCP - General (Family Medicine) Odette Fraction as Consulting Physician (Optometry) Ladene Artist, MD as Consulting Physician (Gastroenterology) Danella Sensing, MD as Consulting Physician (Dermatology) Haydee Monica, MD as Consulting Physician (Endocrinology)    Assessment:     Hearing Screening   125Hz  250Hz  500Hz  1000Hz  2000Hz  3000Hz  4000Hz  6000Hz  8000Hz   Right ear:   0 0 40  0    Left ear:   0 0 40  0    Vision Screening Comments: Last vision exam in Feb 2018   Exercise Activities  and Dietary recommendations Current Exercise Habits: The patient does not participate in regular exercise at present, Exercise limited by: orthopedic condition(s) (back pain)  Goals    . Increase water intake          Starting 09/13/2016, I will attempt to drink at [facility] of water with each meal.       Fall Risk Fall Risk  09/13/2016 09/10/2015 09/09/2014 09/03/2013 01/24/2012  Falls in the past year? No No No Yes No  Number falls in past yr: - - - 1 -  Injury with Fall? - - - Yes -  Risk for fall due to : - - - History of fall(s);Other (Comment) -  Risk for fall due to: Comment - - - Had a "spell" -   Depression Screen PHQ 2/9 Scores 09/13/2016 09/10/2015 09/09/2014 09/03/2013  PHQ - 2 Score 0 0 0 0  PHQ- 9 Score 2 - - -     Cognitive Function MMSE - Mini Mental State Exam 09/13/2016  Orientation to time 5  Orientation to Place 5  Registration 3  Attention/ Calculation 0  Recall 3  Language- name 2 objects 0  Language- repeat 1  Language- follow 3 step command 3  Language- read & follow direction 0  Write a sentence 0  Copy design 0  Total score 20     PLEASE NOTE: A Mini-Cog screen was completed. Maximum score is 20. A value of 0 denotes this part of [place] MMSE was not completed or the patient failed this part of the Mini-Cog screening.   Mini-Cog Screening Orientation to Time - Max 5 pts Orientation to Place - Max 5 pts Registration - Max 3 pts Recall - Max 3 pts Language Repeat - Max 1 pts Language Follow 3 Step Command - Max 3 pts     Immunization History  Administered Date(s) Administered  . Influenza Split 10/16/2010  . Influenza Whole 10/23/2007, 10/13/2011  . Influenza,inj,Quad PF,6+ Mos 09/21/2012, 10/29/2013, 09/09/2014, 10/10/2015  . Pneumococcal Conjugate-13 09/03/2013  . Pneumococcal Polysaccharide-23 08/15/2006  . Td 12/25/2002  . Tdap 01/18/2013  . Zoster 02/02/2012   Screening Tests Health Maintenance  Topic Date Due  . INFLUENZA VACCINE   04/17/2017 (Originally 08/18/2016)  . DEXA SCAN  09/13/2048 (Originally 10/05/2005)  . COLONOSCOPY  10/19/2020  . TETANUS/TDAP  01/19/2023  . PNA vac Low Risk Adult  Completed      Plan:     I have personally reviewed and addressed the Medicare Annual Wellness questionnaire and have noted the following in the patient's chart:  A. Medical and social history B. Use  of alcohol, tobacco or illicit drugs  C. Current medications and supplements D. Functional ability and status E.  Nutritional status F.  Physical activity G. Advance directives H. List of other physicians I.  Hospitalizations, surgeries, and ER visits in previous 12 months J.  [place] to include hearing, vision, cognitive, depression L. Referrals and appointments - none  In addition, I have reviewed and discussed with patient certain preventive protocols, quality metrics, and best practice recommendations. A written personalized care plan for preventive services as well as general preventive health recommendations were provided to patient.  See attached scanned questionnaire for additional information.   Signed,   Lindell Noe, MHA, BS, LPN Health Coach

## 2016-09-15 ENCOUNTER — Ambulatory Visit (INDEPENDENT_AMBULATORY_CARE_PROVIDER_SITE_OTHER): Payer: Medicare Other | Admitting: Family Medicine

## 2016-09-15 ENCOUNTER — Encounter: Payer: Self-pay | Admitting: Family Medicine

## 2016-09-15 VITALS — BP 120/74 | HR 67 | Temp 98.0°F | Ht 68.0 in | Wt 188.0 lb

## 2016-09-15 DIAGNOSIS — I10 Essential (primary) hypertension: Secondary | ICD-10-CM

## 2016-09-15 DIAGNOSIS — Z01419 Encounter for gynecological examination (general) (routine) without abnormal findings: Secondary | ICD-10-CM | POA: Insufficient documentation

## 2016-09-15 DIAGNOSIS — E785 Hyperlipidemia, unspecified: Secondary | ICD-10-CM | POA: Diagnosis not present

## 2016-09-15 DIAGNOSIS — K219 Gastro-esophageal reflux disease without esophagitis: Secondary | ICD-10-CM | POA: Insufficient documentation

## 2016-09-15 DIAGNOSIS — Z0000 Encounter for general adult medical examination without abnormal findings: Secondary | ICD-10-CM | POA: Diagnosis not present

## 2016-09-15 DIAGNOSIS — E890 Postprocedural hypothyroidism: Secondary | ICD-10-CM | POA: Diagnosis not present

## 2016-09-15 MED ORDER — ATENOLOL 50 MG PO TABS: 50.0000 mg | ORAL_TABLET | Freq: Every day | ORAL | 3 refills | Status: DC

## 2016-09-15 MED ORDER — SIMVASTATIN 40 MG PO TABS: 40.0000 mg | ORAL_TABLET | Freq: Every day | ORAL | 3 refills | Status: DC

## 2016-09-15 MED ORDER — OMEPRAZOLE 20 MG PO CPDR: 20.0000 mg | DELAYED_RELEASE_CAPSULE | Freq: Every day | ORAL | 3 refills | Status: DC

## 2016-09-15 MED ORDER — LEVOTHYROXINE SODIUM 100 MCG PO TABS: ORAL_TABLET | ORAL | 3 refills | Status: DC

## 2016-09-15 NOTE — Assessment & Plan Note (Signed)
Well controlled. No changes made to rxs. 

## 2016-09-15 NOTE — Assessment & Plan Note (Signed)
Reviewed preventive care protocols, scheduled due services, and updated immunizations Discussed nutrition, exercise, diet, and healthy lifestyle.  

## 2016-09-15 NOTE — Patient Instructions (Signed)
Great to see you! Happy birthday! 

## 2016-09-15 NOTE — Assessment & Plan Note (Signed)
TSH a little on low side but FT4 normal and she is clinically euthyroid- no changes made to synthroid dose.

## 2016-09-15 NOTE — Progress Notes (Signed)
Subjective:    Patient ID: [name], female    DOB: 1940-07-27, 76 y.o.   MRN: [id number]  HPI  76 yo very pleasant female with h/o HLD, HTN, hypothyroidism, venous insufficiency here for CPX and follow up of chronic medical conditions.  Annual medicare wellness visit with Candis Musa, RN on 09/13/16. Note reviewed.  Health Maintenance  Topic Date Due  . INFLUENZA VACCINE  04/17/2017 (Originally 08/18/2016)  . DEXA SCAN  09/13/2048 (Originally 10/05/2005)  . COLONOSCOPY  10/19/2020  . TETANUS/TDAP  01/19/2023  . PNA vac Low Risk Adult  Completed   GERD has worsened.  OTC prilosec is helping but she is asking for an rx.  HLD-  on zocor 40 mg daily.   Lab Results  Component Value Date   CHOL 142 09/13/2016   HDL 39.80 09/13/2016   LDLCALC 79 09/13/2016   LDLDIRECT 85.2 01/17/2012   TRIG 117.0 09/13/2016   CHOLHDL 4 09/13/2016    Hypothyroidism- on synthroid 100 mcg daily.   No diarrhea, no palpitations, no CP, no tremors, no SOB.  Lab Results  Component Value Date   TSH 0.32 (L) 09/13/2016    HTN- stable on current dose of Atenolol.   Lab Results  Component Value Date   CREATININE 0.66 09/13/2016   Lab Results  Component Value Date   ALT 32 09/13/2016   AST 33 09/13/2016   ALKPHOS 81 09/13/2016   BILITOT 0.6 09/13/2016   Lab Results  Component Value Date   NA 140 09/13/2016   K 4.1 09/13/2016   CL 106 09/13/2016   CO2 28 09/13/2016    Patient Active Problem List   Diagnosis Date Noted  . Well woman exam 09/15/2016  . Vitamin D deficiency 09/10/2015  . Bleeding from varicose vein 08/06/2015  . Spondylosis 09/17/2014  . 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 HLD (hyperlipidemia) 04/16/2013  . HTN (hypertension) 05/08/2012  . Post menopausal syndrome 01/27/2012  . 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 UNSPECIFIED VENOUS INSUFFICIENCY 09/08/2007   Past Medical History:  Diagnosis Date  . Back pain   . Endometriosis   .  Hypercholesteremia   . Hypothyroidism   . Migraine   . Venous stasis ulcer ([place]) 11/1997   Wound care center, [place]   Past Surgical History:  Procedure Laterality Date  . ABDOMINAL HYSTERECTOMY     1 1/2 ovaries gone, second to endometriosis  . APPENDECTOMY    . CYSTOSCOPY    . HEMORRHOID SURGERY    . HERNIA REPAIR    . ROTATOR CUFF REPAIR    . THYROIDECTOMY     Social History  Substance Use Topics  . Smoking status: Never Smoker  . Smokeless tobacco: Never Used  . Alcohol use No   Family History  Problem Relation Age of Onset  . Stroke Mother   . Goiter Mother   . Stomach cancer Sister   . Heart attack Father   . Heart disease Father   . Heart attack Brother   . Kidney disease Brother   . Colon cancer Neg Hx   . Esophageal cancer Neg Hx    Allergies  Allergen Reactions  . Codeine     REACTION: NAUSEA AND VOMITING  . Tramadol     Vomiting, LOC   Current Outpatient Prescriptions on File Prior to Visit  Medication Sig Dispense Refill  . aspirin EC 81 MG tablet Take 81 mg by mouth daily.    [name] atenolol (TENORMIN) 50 MG tablet TAKE 1  TABLET (50 MG TOTAL) BY MOUTH DAILY. 90 tablet 3  . benzonatate (TESSALON) 200 MG capsule Take 1 capsule (200 mg total) by mouth 3 (three) times daily as needed for cough. 30 capsule 0  . butalbital-acetaminophen-caffeine (FIORICET, ESGIC) 50-325-40 MG tablet TAKE ONE TABLET BY MOUTH TWICE A DAY AS NEEDED FOR HEADACHE 60 tablet 0  . Cholecalciferol (VITAMIN D-3) 1000 UNITS CAPS Take 1 capsule by mouth daily.    . fluconazole (DIFLUCAN) 150 MG tablet Take 1 today, then 1 tab on Sunday 2 tablet 0  . ibuprofen (ADVIL,MOTRIN) 800 MG tablet TAKE 1 TABLET BY MOUTH EVERY 4 TO 6 HOURS AS NEEDED 180 tablet 0  . levothyroxine (SYNTHROID, LEVOTHROID) 100 MCG tablet TAKE 1 TABLET (100 MCG TOTAL) BY MOUTH DAILY BEFORE BREAKFAST. 90 tablet 0  . Multiple Vitamin (MULTIVITAMIN) tablet Take 1 tablet by mouth daily.      . simvastatin (ZOCOR) 40 MG  tablet TAKE 1 TABLET BY MOUTH AT BEDTIME 90 tablet 1  . SUMAtriptan (IMITREX) 20 MG/ACT nasal spray Place 1 spray into the nose as needed.       No current facility-administered medications on file prior to visit.    The PMH, PSH, Social History, Family History, Medications, and allergies have been reviewed in [facility], and have been updated if relevant.      Review of Systems  Constitutional: Negative.   HENT: Negative.   Eyes: Negative.   Respiratory: Negative.   Cardiovascular: Negative.  Negative for chest pain.  Gastrointestinal: Negative.   Endocrine: Negative.   Genitourinary: Negative.   Musculoskeletal: Negative.   Skin: Negative.  Negative for rash.  Allergic/Immunologic: Negative.   Neurological: Negative.   Hematological: Negative.   Psychiatric/Behavioral: Negative.   All other systems reviewed and are negative.      Objective:   Physical Exam BP 120/74   Pulse 67   Temp 98 F (36.7 C)   Ht 5\' 8"  (1.727 m)   Wt 188 lb (85.3 kg)   SpO2 97%   BMI 28.59 kg/m    General:  Well-developed,well-nourished,in no acute distress; alert,appropriate and cooperative throughout examination Head:  normocephalic and atraumatic.   Eyes:  vision grossly intact, PERRL Ears:  R ear normal and L ear normal externally, TMs clear bilaterally Nose:  no external deformity.   Mouth:  good dentition.   Neck:  No deformities, masses, or tenderness noted. Lungs:  Normal respiratory effort, chest expands symmetrically. Lungs are clear to auscultation, no crackles or wheezes. Heart:  Normal rate and regular rhythm. S1 and S2 normal without gallop, murmur, click, rub or other extra sounds. Abdomen:  Bowel sounds positive,abdomen soft and non-tender without masses, organomegaly or hernias noted. Msk:  No deformity or scoliosis noted of thoracic or lumbar spine.   Extremities:  No clubbing, cyanosis, edema, or deformity noted with normal full range of motion of all joints.   Neurologic:   alert & oriented X3 and gait normal.   Skin:  Intact without suspicious lesions or rashes Psych:  Cognition and judgment appear intact. Alert and cooperative with normal attention span and concentration. No apparent delusions, illusions, hallucinations      Assessment & Plan:

## 2016-09-22 ENCOUNTER — Other Ambulatory Visit: Payer: Self-pay | Admitting: Family Medicine

## 2016-10-01 ENCOUNTER — Telehealth: Payer: Self-pay | Admitting: Family Medicine

## 2016-10-01 NOTE — Telephone Encounter (Signed)
Patient Name: NHI BUTRUM DOB: March 31, 1940 Initial Comment Caller states, last night her leg felt heavy like it was going to sleep, for a while, she is concerned about blood clots. She would like an appt to be seen today. Nurse Assessment Nurse: Yolanda Bonine, RN, Erin Date/Time (Eastern Time): 10/01/2016 8:54:44 AM Confirm and document reason for call. If symptomatic, describe symptoms. ---caller states her right leg had a cramp last night and now feels weird. Does the patient have any new or worsening symptoms? ---Yes Will a triage be completed? ---Yes Related visit to physician within the last 2 weeks? ---No Does the PT have any chronic conditions? (i.e. diabetes, asthma, etc.) ---Yes List chronic conditions. ---HTN Thyroid Is this a behavioral health or substance abuse call? ---No Guidelines Guideline Title Affirmed Question Affirmed Notes Leg Pain Leg pain or muscle cramp is a chronic symptom (recurrent or ongoing AND present > 4 weeks) Final Disposition User See PCP within 2 Ninetta Lights, RN, Junie Panning Comments has had vein ligation in the right leg in the past History of chronic back pain Referrals REFERRED TO PCP OFFICE Disagree/Comply: Comply

## 2016-10-01 NOTE — Telephone Encounter (Signed)
I spoke with pt's daughter Lynelle Smoke and she said pt and her husband had gone to [employer] and I tried both cells with no success; Tammy will have pt call [facility] for appt.

## 2016-10-08 ENCOUNTER — Telehealth: Payer: Self-pay | Admitting: Family Medicine

## 2016-10-08 NOTE — Telephone Encounter (Signed)
Since I am leaving, you don't have to ask me if a patient can transfer.  She is a very nice lady.

## 2016-10-08 NOTE — Telephone Encounter (Signed)
PT is requesting to transfer care due to Dr. Hulen Shouts move from Dr. Deborra Medina to Dr. Damita Dunnings. OK to transfer? Next new pt is over 1 year out. If OK when can we transfer?

## 2016-10-09 NOTE — Telephone Encounter (Signed)
Okay to me to transfer.  30 min OV in about 6 months would be reasonable since she just had a CPE.  Thanks.

## 2016-10-11 ENCOUNTER — Other Ambulatory Visit: Payer: Self-pay | Admitting: *Deleted

## 2016-10-11 MED ORDER — IBUPROFEN 800 MG PO TABS: ORAL_TABLET | ORAL | 3 refills | Status: DC

## 2016-10-11 NOTE — Telephone Encounter (Signed)
Pt scheduled for 04/12/17. Pt aware

## 2016-10-11 NOTE — Telephone Encounter (Signed)
Pt left a note at triage requesting refill of medication, CPE done on 09/15/16, last filled on 07/22/16 #180 tabs with 0 refills, please advise

## 2016-10-18 ENCOUNTER — Ambulatory Visit (INDEPENDENT_AMBULATORY_CARE_PROVIDER_SITE_OTHER): Payer: Medicare Other | Admitting: Internal Medicine

## 2016-10-18 ENCOUNTER — Encounter: Payer: Self-pay | Admitting: Internal Medicine

## 2016-10-18 ENCOUNTER — Telehealth: Payer: Self-pay | Admitting: Family Medicine

## 2016-10-18 VITALS — BP 124/76 | HR 65 | Temp 97.8°F | Wt 189.0 lb

## 2016-10-18 DIAGNOSIS — L539 Erythematous condition, unspecified: Secondary | ICD-10-CM | POA: Diagnosis not present

## 2016-10-18 MED ORDER — CEPHALEXIN 250 MG PO CAPS: 250.0000 mg | ORAL_CAPSULE | Freq: Two times a day (BID) | ORAL | 0 refills | Status: DC

## 2016-10-18 NOTE — Patient Instructions (Signed)

## 2016-10-18 NOTE — Progress Notes (Signed)
Subjective:    Patient ID: [name], female    DOB: [birth year], 76 y.o.   MRN: [id number]  HPI  Pt presents to the clinic today with c/o red painful bump under her right ankle. She noticed this 2 days ago. It is red, warm and tender to touch. She has not noticed any drainage from the area. She has tried Ibuprofen and ice with some relief. She reports she has previously had to have vein stripping in this leg years ago.  Review of Systems  Past Medical History:  Diagnosis Date  . Back pain   . Endometriosis   . Hypercholesteremia   . Hypothyroidism   . Migraine   . Venous stasis ulcer ([place]) 11/1997   Wound care center, [place]    Current Outpatient Prescriptions  Medication Sig Dispense Refill  . aspirin EC 81 MG tablet Take 81 mg by mouth daily.    [name] atenolol (TENORMIN) 50 MG tablet Take 1 tablet (50 mg total) by mouth daily. 90 tablet 3  . benzonatate (TESSALON) 200 MG capsule Take 1 capsule (200 mg total) by mouth 3 (three) times daily as needed for cough. 30 capsule 0  . butalbital-acetaminophen-caffeine (FIORICET, ESGIC) 50-325-40 MG tablet TAKE ONE TABLET BY MOUTH TWICE A DAY AS NEEDED FOR HEADACHE 60 tablet 0  . Cholecalciferol (VITAMIN D-3) 1000 UNITS CAPS Take 1 capsule by mouth daily.    . fluconazole (DIFLUCAN) 150 MG tablet Take 1 today, then 1 tab on Sunday 2 tablet 0  . ibuprofen (ADVIL,MOTRIN) 800 MG tablet TAKE 1 TABLET BY MOUTH EVERY 4 TO 6 HOURS AS NEEDED 180 tablet 3  . levothyroxine (SYNTHROID, LEVOTHROID) 100 MCG tablet TAKE 1 TABLET (100 MCG TOTAL) BY MOUTH DAILY BEFORE BREAKFAST. 90 tablet 3  . Multiple Vitamin (MULTIVITAMIN) tablet Take 1 tablet by mouth daily.      [name] omeprazole (PRILOSEC) 20 MG capsule Take 1 capsule (20 mg total) by mouth daily. 90 capsule 3  . simvastatin (ZOCOR) 40 MG tablet Take 1 tablet (40 mg total) by mouth at bedtime. 90 tablet 3  . SUMAtriptan (IMITREX) 20 MG/ACT nasal spray Place 1 spray into the nose as needed.        No current facility-administered medications for this visit.     Allergies  Allergen Reactions  . Codeine     REACTION: NAUSEA AND VOMITING  . Tramadol     Vomiting, LOC    Family History  Problem Relation Age of Onset  . Stroke Mother   . Goiter Mother   . Stomach cancer Sister   . Heart attack Father   . Heart disease Father   . Heart attack Brother   . Kidney disease Brother   . Colon cancer Neg Hx   . Esophageal cancer Neg Hx     Social History   Social History  . Marital status: Married    Spouse name: N/A  . Number of children: 2  . Years of education: N/A   Occupational History  . Retired Retired   Social History Main Topics  . Smoking status: Never Smoker  . Smokeless tobacco: Never Used  . Alcohol use No  . Drug use: No  . Sexual activity: Not on file   Other Topics Concern  . Not on file   Social History Narrative   Lives with husband   Does not have living will.   Would desire CPR but would not want to be on life support for  prolonged period of time if futile.           Constitutional: Denies fever, malaise, fatigue, headache or abrupt weight changes.  Musculoskeletal: Denies decrease in range of motion, difficulty with gait, muscle pain or joint pain and swelling.  Skin: Pt reports red lump below right ankle. Denies or ulcercations.    No other specific complaints in a complete review of systems (except as listed in HPI above).     Objective:   Physical Exam  BP 124/76   Pulse 65   Temp 97.8 F (36.6 C) (Oral)   Wt 189 lb (85.7 kg)   SpO2 97%   BMI 28.74 kg/m  Wt Readings from Last 3 Encounters:  10/18/16 189 lb (85.7 kg)  09/15/16 188 lb (85.3 kg)  09/13/16 184 lb 12 oz (83.8 kg)    General: Appears her stated age, well developed, well nourished in NAD. Skin: She has scabbing noted below her medial malleolus, with redness and petechia. Concern for mild cellulitis.   BMET    Component Value Date/Time   NA 140  09/13/2016 0852   NA 138 08/22/2011 1737   K 4.1 09/13/2016 0852   K 3.9 08/22/2011 1737   CL 106 09/13/2016 0852   CL 104 08/22/2011 1737   CO2 28 09/13/2016 0852   CO2 25 08/22/2011 1737   GLUCOSE 107 (H) 09/13/2016 0852   GLUCOSE 119 (H) 08/22/2011 1737   BUN 13 09/13/2016 0852   BUN 15 08/22/2011 1737   CREATININE 0.66 09/13/2016 0852   CREATININE 0.75 08/22/2011 1737   CALCIUM 9.0 09/13/2016 0852   CALCIUM 8.5 08/22/2011 1737   GFRNONAA >60 07/29/2015 2125   GFRNONAA >60 08/22/2011 1737   GFRAA >60 07/29/2015 2125   GFRAA >60 08/22/2011 1737    Lipid Panel     Component Value Date/Time   CHOL 142 09/13/2016 0852   TRIG 117.0 09/13/2016 0852   HDL 39.80 09/13/2016 0852   CHOLHDL 4 09/13/2016 0852   VLDL 23.4 09/13/2016 0852   LDLCALC 79 09/13/2016 0852    CBC    Component Value Date/Time   WBC 5.9 09/03/2015 0755   RBC 3.93 09/03/2015 0755   HGB 12.6 09/03/2015 0755   HGB 13.0 08/22/2011 1737   HCT 36.9 09/03/2015 0755   HCT 38.7 08/22/2011 1737   PLT 245.0 09/03/2015 0755   PLT 242 08/22/2011 1737   MCV 93.9 09/03/2015 0755   MCV 97 08/22/2011 1737   MCH 32.9 07/29/2015 2125   MCHC 34.0 09/03/2015 0755   RDW 13.3 09/03/2015 0755   RDW 12.6 08/22/2011 1737   LYMPHSABS 1.6 09/03/2015 0755   LYMPHSABS 1.9 08/22/2011 1737   MONOABS 0.6 09/03/2015 0755   MONOABS 0.8 08/22/2011 1737   EOSABS 0.3 09/03/2015 0755   EOSABS 0.3 08/22/2011 1737   BASOSABS 0.0 09/03/2015 0755   BASOSABS 0.2 (H) 08/22/2011 1737    Hgb A1C Lab Results  Component Value Date   HGBA1C 5.9 09/05/2014            Assessment & Plan:   Redness of Ankle:  Encouraged elevated Warm compresses Will treat for possible cellulitis, eRx for Keflex 250 mg BID x 5 days Ok to take Ibuprofen as needed  Return precautions discussed Webb Silversmith, NP

## 2016-10-18 NOTE — Telephone Encounter (Signed)
Pt has appt to see R Baity NP 10/18/16 3PM.

## 2016-10-18 NOTE — Telephone Encounter (Signed)
[facility] Patient Name: NYRA ANSPAUGH DOB: 1940-10-04 Initial Comment Caller had a bump on her foot that doesnt look normal. It is burning. Nurse Assessment Nurse: Markus Daft, RN, Sherre Poot Date/Time (Eastern Time): 10/18/2016 9:55:21 AM Confirm and document reason for call. If symptomatic, describe symptoms. ---Caller had a burning painful bump on her right foot just under her ankle with redness. Just smaller than a pea. no fever. S/S started yesterday with redness. - h/o veins stripped in her leg and required wound care at wound center. This bump is on the same leg. Does the patient have any new or worsening symptoms? ---Yes Will a triage be completed? ---Yes Related visit to physician within the last 2 weeks? ---No Does the PT have any chronic conditions? (i.e. diabetes, asthma, etc.) ---Yes List chronic conditions. ---vein stripping right leg Is this a behavioral health or substance abuse call? ---No Guidelines Guideline Title Affirmed Question Affirmed Notes Skin Lump or Localized Swelling [1] Swelling is painful to touch AND [2] no fever Final Disposition User See Physician within [address] Hours [place], [place], [place] Comments Took Motrin this morning which has helped the pain. Golden Hurter at 3 pm today. No appts with Dr. Deborra Medina or Damita Dunnings available. Referrals REFERRED TO PCP OFFICE Caller Disagree/Comply Comply Caller Understands Yes PreDisposition Go to Urgent Care/Walk-In Clinic

## 2016-10-25 ENCOUNTER — Encounter: Payer: Self-pay | Admitting: Family Medicine

## 2016-10-25 ENCOUNTER — Ambulatory Visit (INDEPENDENT_AMBULATORY_CARE_PROVIDER_SITE_OTHER): Payer: Medicare Other | Admitting: Family Medicine

## 2016-10-25 VITALS — BP 156/66 | HR 71 | Temp 97.7°F | Wt 190.5 lb

## 2016-10-25 DIAGNOSIS — L97511 Non-pressure chronic ulcer of other part of right foot limited to breakdown of skin: Secondary | ICD-10-CM

## 2016-10-25 MED ORDER — DOXYCYCLINE HYCLATE 100 MG PO TABS: 100.0000 mg | ORAL_TABLET | Freq: Two times a day (BID) | ORAL | 0 refills | Status: DC

## 2016-10-25 NOTE — Progress Notes (Signed)
Recently seen for lesion on right medial ankle. She has a history of ulceration in the area years ago that required wound clinic treatment and multiple pain procedures. She was started on Keflex recently. Now done with recent rx for keflex.  She continues to have pain locally on the right medial ankle. 8x81mm ulceration on the R medial ankle.  No FCNAVD.  Burning pain at night.    Meds, vitals, and allergies reviewed.   ROS: Per HPI unless specifically indicated in ROS section   nad R foot with intact DP pulse but multiple varicose veins noted. 8x45mm ulceration on the L medial ankle with a small amount of peripheral erythema.  There was a small red in the ulcer. This was removed with a Q-tip, without complication. Unclear if this was old suture material for procedure in the past or if it was a small thread from fabric.

## 2016-10-25 NOTE — Patient Instructions (Signed)
Start doxycycline.   Use a hydrocolloid dressing daily with compression/support hose.  Elevate your legs in the meantime.  Update me if worse in the meantime- I want to hear how you are doing in 48 hours, either way.  Take care.  Glad to see you.

## 2016-10-26 ENCOUNTER — Encounter: Payer: Self-pay | Admitting: Family Medicine

## 2016-10-26 ENCOUNTER — Telehealth: Payer: Self-pay | Admitting: Family Medicine

## 2016-10-26 ENCOUNTER — Encounter: Payer: Self-pay | Admitting: Internal Medicine

## 2016-10-26 ENCOUNTER — Ambulatory Visit (INDEPENDENT_AMBULATORY_CARE_PROVIDER_SITE_OTHER): Payer: Medicare Other | Admitting: Internal Medicine

## 2016-10-26 VITALS — BP 142/80 | HR 66 | Temp 97.6°F | Wt 189.8 lb

## 2016-10-26 DIAGNOSIS — L97511 Non-pressure chronic ulcer of other part of right foot limited to breakdown of skin: Secondary | ICD-10-CM | POA: Diagnosis not present

## 2016-10-26 NOTE — Telephone Encounter (Signed)
Did this go to triage?  Please get update from patient and let me know.  Please see mychart message.  When she says the wound looks worse, please get details on that.  Thanks.

## 2016-10-26 NOTE — Telephone Encounter (Signed)
°  Patient Name: [name]  DOB: 04/08/1940    Initial Comment Caller states his wife was in the office yesterday for an ulcer on her ankle. This morning it was bleeding bad, he undressed it and it looks like it has gotten worse. He wants to make her an appointment to come back in today.    Nurse Assessment  Nurse: Sherrell Puller, RN, Amy Date/Time Eilene Ghazi Time): 10/26/2016 8:33:22 AM  Confirm and document reason for call. If symptomatic, describe symptoms. ---Caller states his wife has an dime-size ulcer on the left ankle, they changed the dressing this morning and it looks worse and deeper than yesterday and was bleeding a lot this morning but it has stopped now. Saw doctor Damita Dunnings yesterday and says doctor Damita Dunnings told him to call if it looked worse and they are wanting to be seen again. Some redness and swelling around the wound but no red streaks, no fever. Says the wound is "stinging and burning", pain level 3/10.  Does the patient have any new or worsening symptoms? ---Yes  Will a triage be completed? ---Yes  Related visit to physician within the last 2 weeks? ---Yes  Does the PT have any chronic conditions? (i.e. diabetes, asthma, etc.) ---No  Is this a behavioral health or substance abuse call? ---No     Guidelines    Guideline Title Affirmed Question Affirmed Notes  Sores Looks like a boil or deep ulcer    Final Disposition User   See Physician within 24 Hours [place], Therapist, sports, Amy    Referrals  REFERRED TO PCP OFFICE   Caller Disagree/Comply Comply  Caller Understands Yes  PreDisposition Call Doctor

## 2016-10-26 NOTE — Patient Instructions (Signed)
Venous Ulcer A venous ulcer is a shallow sore on your lower leg. It is caused by poor circulation in your veins. Venous ulcer is the most common type of lower leg ulcer. You may have venous ulcers on one leg or on both legs. This condition most often develops around your ankles. This type of ulcer may last for a long time (chronic ulcer) or it may return often (recurrent ulcer). Follow these instructions at home: Wound care  Follow instructions from your doctor about: ? How to take care of your wound. ? When and how you should change your bandage (dressing). ? When you should remove your bandage. If your bandage is dry and gets stuck to your leg when you try to remove it, moisten or wet the bandage with saline solution or water. This helps you to remove it without harming your skin or wound.  Check your wound every day for signs of infection. Have a caregiver do this for you if you are not able to do it yourself. Watch for: ? More redness, swelling, or pain. ? More fluid or blood. ? Pus, warmth, or a bad smell. Medicines  Take over-the-counter and prescription medicines only as told by your doctor.  If you were prescribed an antibiotic medicine, take it or apply it as told by your doctor. Do not stop taking or using the antibiotic even if your condition improves. Activity  Do not stand or sit in one position for a long period of time. Rest with your legs raised during the day. If possible, keep your legs above your heart for 30 minutes, 3-4 times a day, or as told by your doctor.  Do not sit with your legs crossed.  Walk often to increase the blood flow in your legs. Ask your doctor what level of activity is safe for you.  If you are taking a long ride in a car or plane, take a break to walk around at least once every two hours, or as told by your doctor. Ask your doctor if you should take aspirin before long trips. General instructions   Wear elastic stockings, compression stockings,  or support hose as told by your doctor. This is very important.  Raise the foot of your bed as told by your doctor.  Do not smoke.  Keep all follow-up visits as told by your doctor. This is important. Contact a doctor if:  You have a fever.  Your ulcer is getting larger or is not healing.  Your pain gets worse.  You have more redness or swelling around your ulcer.  You have more fluid, blood, or pus coming from your ulcer after it has been cleaned by you or your doctor.  You have warmth or a bad smell coming from your ulcer. This information is not intended to replace advice given to you by your health care provider. Make sure you discuss any questions you have with your health care provider. Document Released: 02/12/2004 Document Revised: 06/12/2015 Document Reviewed: 05/15/2014 Elsevier Interactive Patient Education  2018 [employer]  

## 2016-10-26 NOTE — Telephone Encounter (Signed)
See phone note earlier today and pt has appt to see Kim Echevaria NP today at 11:15 AM.

## 2016-10-26 NOTE — Progress Notes (Signed)
Subjective:    Patient ID: [name], female    DOB: Jun 27, 1940, 76 y.o.   MRN: [id number]  HPI  Pt presents to the clinic today to follow up the wound to her right medial foot. She saw me 10/1 for the same. She saw Dr. Damita Dunnings yesterday for follow up. He advised her to use TED hose. He put her on antibiotics and sent her to the wound center. She reports she has an appt on Friday. She reports this morning, when she tried to put the TED hose on her right leg, the wound started to bleed and this concerned her. She would like reeval today.  Review of Systems      Past Medical History:  Diagnosis Date  . Back pain   . Endometriosis   . Hypercholesteremia   . Hypothyroidism   . Migraine   . Venous stasis ulcer ([place]) 11/1997   Wound care center, [place]    Current Outpatient Prescriptions  Medication Sig Dispense Refill  . aspirin EC 81 MG tablet Take 81 mg by mouth daily.    [name] atenolol (TENORMIN) 50 MG tablet Take 1 tablet (50 mg total) by mouth daily. 90 tablet 3  . butalbital-acetaminophen-caffeine (FIORICET, ESGIC) 50-325-40 MG tablet TAKE ONE TABLET BY MOUTH TWICE A DAY AS NEEDED FOR HEADACHE 60 tablet 0  . Cholecalciferol (VITAMIN D-3) 1000 UNITS CAPS Take 1 capsule by mouth daily.    [name] doxycycline (VIBRA-TABS) 100 MG tablet Take 1 tablet (100 mg total) by mouth 2 (two) times daily. 20 tablet 0  . ibuprofen (ADVIL,MOTRIN) 800 MG tablet TAKE 1 TABLET BY MOUTH EVERY 4 TO 6 HOURS AS NEEDED 180 tablet 3  . levothyroxine (SYNTHROID, LEVOTHROID) 100 MCG tablet TAKE 1 TABLET (100 MCG TOTAL) BY MOUTH DAILY BEFORE BREAKFAST. 90 tablet 3  . Multiple Vitamin (MULTIVITAMIN) tablet Take 1 tablet by mouth daily.      [name] omeprazole (PRILOSEC) 20 MG capsule Take 1 capsule (20 mg total) by mouth daily. 90 capsule 3  . simvastatin (ZOCOR) 40 MG tablet Take 1 tablet (40 mg total) by mouth at bedtime. 90 tablet 3  . SUMAtriptan (IMITREX) 20 MG/ACT nasal spray Place 1 spray into the nose as  needed.       No current facility-administered medications for this visit.     Allergies  Allergen Reactions  . Codeine     REACTION: NAUSEA AND VOMITING  . Tramadol     Vomiting, LOC    Family History  Problem Relation Age of Onset  . Stroke Mother   . Goiter Mother   . Stomach cancer Sister   . Heart attack Father   . Heart disease Father   . Heart attack Brother   . Kidney disease Brother   . Colon cancer Neg Hx   . Esophageal cancer Neg Hx     Social History   Social History  . Marital status: Married    Spouse name: N/A  . Number of children: 2  . Years of education: N/A   Occupational History  . Retired Retired   Social History Main Topics  . Smoking status: Never Smoker  . Smokeless tobacco: Never Used  . Alcohol use No  . Drug use: No  . Sexual activity: Not on file   Other Topics Concern  . Not on file   Social History Narrative   Lives with husband   Does not have living will.   Would desire CPR but would  not want to be on life support for prolonged period of time if futile.           Constitutional: Denies fever, malaise, fatigue, headache or abrupt weight changes.  Skin: Pt reports open wound of right foot. Denies redness, rashes.   No other specific complaints in a complete review of systems (except as listed in HPI above).  Objective:   Physical Exam   BP (!) 142/80   Pulse 66   Temp 97.6 F (36.4 C) (Oral)   Wt 189 lb 12 oz (86.1 kg)   SpO2 93%   BMI 28.85 kg/m  Wt Readings from Last 3 Encounters:  10/26/16 189 lb 12 oz (86.1 kg)  10/25/16 190 lb 8 oz (86.4 kg)  10/18/16 189 lb (85.7 kg)    General: Appears her stated age, in NAD. Skin: 75mm x 90mm ulceration noted on right medial ankle. No bloody drainage noted from the area.  BMET    Component Value Date/Time   NA 140 09/13/2016 0852   NA 138 08/22/2011 1737   K 4.1 09/13/2016 0852   K 3.9 08/22/2011 1737   CL 106 09/13/2016 0852   CL 104 08/22/2011 1737   CO2  28 09/13/2016 0852   CO2 25 08/22/2011 1737   GLUCOSE 107 (H) 09/13/2016 0852   GLUCOSE 119 (H) 08/22/2011 1737   BUN 13 09/13/2016 0852   BUN 15 08/22/2011 1737   CREATININE 0.66 09/13/2016 0852   CREATININE 0.75 08/22/2011 1737   CALCIUM 9.0 09/13/2016 0852   CALCIUM 8.5 08/22/2011 1737   GFRNONAA >60 07/29/2015 2125   GFRNONAA >60 08/22/2011 1737   GFRAA >60 07/29/2015 2125   GFRAA >60 08/22/2011 1737    Lipid Panel     Component Value Date/Time   CHOL 142 09/13/2016 0852   TRIG 117.0 09/13/2016 0852   HDL 39.80 09/13/2016 0852   CHOLHDL 4 09/13/2016 0852   VLDL 23.4 09/13/2016 0852   LDLCALC 79 09/13/2016 0852    CBC    Component Value Date/Time   WBC 5.9 09/03/2015 0755   RBC 3.93 09/03/2015 0755   HGB 12.6 09/03/2015 0755   HGB 13.0 08/22/2011 1737   HCT 36.9 09/03/2015 0755   HCT 38.7 08/22/2011 1737   PLT 245.0 09/03/2015 0755   PLT 242 08/22/2011 1737   MCV 93.9 09/03/2015 0755   MCV 97 08/22/2011 1737   MCH 32.9 07/29/2015 2125   MCHC 34.0 09/03/2015 0755   RDW 13.3 09/03/2015 0755   RDW 12.6 08/22/2011 1737   LYMPHSABS 1.6 09/03/2015 0755   LYMPHSABS 1.9 08/22/2011 1737   MONOABS 0.6 09/03/2015 0755   MONOABS 0.8 08/22/2011 1737   EOSABS 0.3 09/03/2015 0755   EOSABS 0.3 08/22/2011 1737   BASOSABS 0.0 09/03/2015 0755   BASOSABS 0.2 (H) 08/22/2011 1737    Hgb A1C Lab Results  Component Value Date   HGBA1C 5.9 09/05/2014           Assessment & Plan:   Ulcer of Right Foot:  Wound redressed with TAB ointment and guaze No change of treatment at this time She will follow up with the wound clinic this Friday  Return precautions discussed Webb Silversmith, NP

## 2016-10-26 NOTE — Assessment & Plan Note (Signed)
Given the erythema and ulceration would start doxycycline. Intact dorsalis pedis pulse. Okay for outpatient follow-up. Refer to wound clinic. Hydrocolloid dressing put on the ulcer. Advised patient to use a compression stocking in the meantime. Change dressing daily and update me in about 2 days, sooner if needed. She agrees.

## 2016-10-26 NOTE — Telephone Encounter (Signed)
I spoke with pt; pt put on support stockings last night and pt slept in support stockings; this morning when pt put foot on floor blood ran down her ankle into pts shoe; redressed area with another dressing pad; legs elevated now and not bleeding.pt has appt on 10/29/16 at wound center in [place]. Pt also has appt with Avie Echevaria NP today at 11:15. Pt is not sure if any signs of infection or not because when looked at this morning there was so much blood that she did not notice anything else. Pt will keep appt with Avie Echevaria NP today. FYI to Dr Damita Dunnings and Avie Echevaria NP.

## 2016-10-26 NOTE — Telephone Encounter (Signed)
Thanks

## 2016-10-29 ENCOUNTER — Encounter: Payer: Medicare Other | Attending: Surgery | Admitting: Surgery

## 2016-10-29 DIAGNOSIS — Z79899 Other long term (current) drug therapy: Secondary | ICD-10-CM | POA: Insufficient documentation

## 2016-10-29 DIAGNOSIS — I890 Lymphedema, not elsewhere classified: Secondary | ICD-10-CM | POA: Diagnosis not present

## 2016-10-29 DIAGNOSIS — I10 Essential (primary) hypertension: Secondary | ICD-10-CM | POA: Diagnosis not present

## 2016-10-29 DIAGNOSIS — I83013 Varicose veins of right lower extremity with ulcer of ankle: Secondary | ICD-10-CM | POA: Diagnosis not present

## 2016-10-29 DIAGNOSIS — L97312 Non-pressure chronic ulcer of right ankle with fat layer exposed: Secondary | ICD-10-CM | POA: Insufficient documentation

## 2016-10-29 DIAGNOSIS — I872 Venous insufficiency (chronic) (peripheral): Secondary | ICD-10-CM | POA: Diagnosis not present

## 2016-10-31 NOTE — Progress Notes (Signed)
GIAVONNI, CIZEK ([id number]) Visit Report for 10/29/2016 Abuse/Suicide Risk Screen Details Patient Name: CLOSSER, [name] Date of Service: 10/29/2016 8:00 AM Medical Record Number: [id number] Patient Account Number: [id number] Date of Birth/Sex: [birth year] (76 y.o. Female) Treating RN: Cornell Barman Primary Care Patric Buckhalter: Elsie Stain Other Clinician: Referring Adeola Dennen: Elsie Stain Treating Jhaden Pizzuto/Extender: Frann Rider in Treatment: 0 Abuse/Suicide Risk Screen Items Answer ABUSE/SUICIDE RISK SCREEN: Has anyone close to you tried to hurt or harm you recentlyo No Do you feel uncomfortable with anyone in your familyo No Has anyone forced you do things that you didnot want to doo No Do you have any thoughts of harming yourselfo No Patient displays signs or symptoms of abuse and/or neglect. No Electronic Signature(s) Signed: 10/29/2016 4:29:15 PM By: Gretta Cool, BSN, RN, CWS, Kim RN, BSN Entered By: Gretta Cool, BSN, RN, CWS, Kim on 10/29/2016 36:14:43 [name] ([id number]) -------------------------------------------------------------------------------- Activities of Daily Living Details Patient Name: Bellefeuille, Dashawn T. Date of Service: 10/29/2016 8:00 AM Medical Record Number: [id number] Patient Account Number: [id number] Date of Birth/Sex: 1941/01/02 (76 y.o. Female) Treating RN: Cornell Barman Primary Care Parrish Daddario: Elsie Stain Other Clinician: Referring Sanae Willetts: Elsie Stain Treating Avyana Puffenbarger/Extender: Frann Rider in Treatment: 0 Activities of Daily Living Items Answer Activities of Daily Living (Please select one for each item) Drive Automobile Completely Able Take Medications Completely Able Use Telephone Completely Able Care for Appearance Completely Able Use Toilet Completely Able Bath / Shower Completely Able Dress Self Completely Able Feed Self Completely Able Walk Completely Able Get In / Out Bed Completely Able Housework Completely  Able Prepare Meals Completely Able Handle Money Completely Able Shop for Self Completely Able Electronic Signature(s) Signed: 10/29/2016 4:29:15 PM By: Gretta Cool, BSN, RN, CWS, Kim RN, BSN Entered By: Gretta Cool, BSN, RN, CWS, Kim on 10/29/2016 08:26:34 [name] ([id number]) -------------------------------------------------------------------------------- Education Assessment Details Patient Name: Mattox, Junnie T. Date of Service: 10/29/2016 8:00 AM Medical Record Number: [id number] Patient Account Number: [id number] Date of Birth/Sex: 05-07-1940 (76 y.o. Female) Treating RN: Cornell Barman Primary Care Rokia Bosket: Elsie Stain Other Clinician: Referring Jamira Barfuss: Elsie Stain Treating Ronniesha Seibold/Extender: Frann Rider in Treatment: 0 Primary Learner Assessed: Patient Learning Preferences/Education Level/Primary Language Learning Preference: Explanation, Demonstration Highest Education Level: High School Preferred Language: English Cognitive Barrier Assessment/Beliefs Language Barrier: No Translator Needed: No Memory Deficit: No Emotional Barrier: No Cultural/Religious Beliefs Affecting Medical No Care: Physical Barrier Assessment Impaired Vision: Yes Glasses Impaired Hearing: No Decreased Hand dexterity: No Knowledge/Comprehension Assessment Knowledge Level: High Comprehension Level: High Ability to understand written High instructions: Ability to understand verbal High instructions: Motivation Assessment Anxiety Level: Calm Cooperation: Cooperative Education Importance: Acknowledges Need Interest in Health Problems: Asks Questions Perception: Coherent Willingness to Engage in Self- High Management Activities: Readiness to Engage in Self- High Management Activities: Electronic Signature(s) MITSY, OWEN ([id number]) Signed: 10/29/2016 4:29:15 PM By: Gretta Cool, BSN, RN, CWS, Kim RN, BSN Entered By: Gretta Cool, BSN, RN, CWS, Kim on 10/29/2016  08:27:04 [name] ([id number]) -------------------------------------------------------------------------------- Fall Risk Assessment Details Patient Name: Kim Crumble, Syleena T. Date of Service: 10/29/2016 8:00 AM Medical Record Number: [id number] Patient Account Number: [id number] Date of Birth/Sex: 1940/12/28 (76 y.o. Female) Treating RN: Cornell Barman Primary Care Seira Cody: Elsie Stain Other Clinician: Referring Dehlia Kilner: Elsie Stain Treating Braeden Dolinski/Extender: Frann Rider in Treatment: 0 Fall Risk Assessment Items Have you had 2 or more falls in the last 12 monthso 0 No Have you had any fall that resulted in injury in the last 12 monthso 0 No FALL RISK ASSESSMENT: History  of falling - immediate or within 3 months 0 No Secondary diagnosis 0 No Ambulatory aid None/bed rest/wheelchair/nurse 0 Yes Crutches/cane/walker 0 No Furniture 0 No IV Access/Saline Lock 0 No Gait/Training Normal/bed rest/immobile 0 Yes Weak 0 No Impaired 0 No Mental Status Oriented to own ability 0 Yes Electronic Signature(s) Signed: 10/29/2016 4:29:15 PM By: Gretta Cool, BSN, RN, CWS, Kim RN, BSN Entered By: Gretta Cool, BSN, RN, CWS, Kim on 10/29/2016 44:03:47 [name] ([id number]) -------------------------------------------------------------------------------- Foot Assessment Details Patient Name: Riccardi, Kindall T. Date of Service: 10/29/2016 8:00 AM Medical Record Number: [id number] Patient Account Number: [id number] Date of Birth/Sex: 17-May-1940 (76 y.o. Female) Treating RN: Cornell Barman Primary Care Simpson Paulos: Elsie Stain Other Clinician: Referring Kizzy Olafson: Elsie Stain Treating Sultana Tierney/Extender: Frann Rider in Treatment: 0 Foot Assessment Items Site Locations + = Sensation present, - = Sensation absent, C = Callus, U = Ulcer R = Redness, W = Warmth, M = Maceration, PU = Pre-ulcerative lesion F = Fissure, S = Swelling, D = Dryness Assessment Right: Left: Other  Deformity: No No Prior Foot Ulcer: No No Prior Amputation: No No Charcot Joint: No No Ambulatory Status: Ambulatory Without Help Gait: Steady Electronic Signature(s) Signed: 10/29/2016 4:29:15 PM By: Gretta Cool, BSN, RN, CWS, Kim RN, BSN Entered By: Gretta Cool, BSN, RN, CWS, Kim on 10/29/2016 08:27:56 Will, Eveleen Darene Lamer ([id number]) -------------------------------------------------------------------------------- Nutrition Risk Assessment Details Patient Name: Ambrose, Davon T. Date of Service: 10/29/2016 8:00 AM Medical Record Number: [id number] Patient Account Number: [id number] Date of Birth/Sex: Feb 25, 1940 (76 y.o. Female) Treating RN: Cornell Barman Primary Care Lamond Glantz: Elsie Stain Other Clinician: Referring Shadi Sessler: Elsie Stain Treating Rawad Bochicchio/Extender: Frann Rider in Treatment: 0 Height (in): 68 Weight (lbs): 191.1 Body Mass Index (BMI): 29.1 Nutrition Risk Assessment Items NUTRITION RISK SCREEN: I have an illness or condition that made me change the kind and/or 0 No amount of food I eat I eat fewer than two meals per day 0 No I eat few fruits and vegetables, or milk products 0 No I have three or more drinks of beer, liquor or wine almost every day 0 No I have tooth or mouth problems that make it hard for me to eat 0 No I don't always have enough money to buy the food I need 0 No I eat alone most of the time 0 No I take three or more different prescribed or over-the-counter drugs a 1 Yes day Without wanting to, I have lost or gained 10 pounds in the last six 0 No months I am not always physically able to shop, cook and/or feed myself 0 No Nutrition Protocols Good Risk Protocol 0 No interventions needed Moderate Risk Protocol Electronic Signature(s) Signed: 10/29/2016 4:29:15 PM By: Gretta Cool, BSN, RN, CWS, Kim RN, BSN Entered By: Gretta Cool, BSN, RN, CWS, Kim on 10/29/2016 08:27:35

## 2016-10-31 NOTE — Progress Notes (Signed)
[name] ([id number]) Visit Report for 10/29/2016 Chief Complaint Document Details Patient Name: [name], [name] [name]. Date of Service: 10/29/2016 8:00 AM Medical Record Number: [id number] Patient Account Number: [id number] Date of Birth/Sex: May 30, 1940 (76 y.o. Female) Treating RN: [name] Primary Care Provider: Elsie [name] Other Clinician: Referring Provider: Elsie [name] Treating Provider/Extender: [name] in Treatment: 0 Information Obtained from: Patient Chief Complaint Patient presents for treatment of an open ulcer due to venous insufficiency Situated at her right medial ankle for about 3 weeks Electronic Signature(s) Signed: 10/29/2016 9:34:11 AM By: Kim Fudge MD, FACS Entered By: [name] on 10/29/2016 09:34:11 [name], Kim [name]. ([id number]) -------------------------------------------------------------------------------- HPI Details Patient Name: [name], Kim [name]. Date of Service: 10/29/2016 8:00 AM Medical Record Number: [id number] Patient Account Number: [id number] Date of Birth/Sex: 03-Oct-1940 (76 y.o. Female) Treating RN: [name] Primary Care Provider: Elsie [name] Other Clinician: Referring Provider: Elsie [name] Treating Provider/Extender: [name] in Treatment: 0 History of Present Illness Location: Patient presents with an ulcer on the right medial ankle. Quality: Patient reports experiencing a dull pain to affected area(s). Severity: Patient states wound are getting worse. Duration: Patient has had the wound for < 4 weeks prior to presenting for treatment Timing: Pain in wound is Intermittent (comes and goes Context: The wound would happen gradually Modifying Factors: Other treatment(s) tried include:local compression stockings recently Associated Signs and Symptoms: Patient reports having increase swelling. HPI Description: 76 year old patient was recently seen by her PCP Dr. Elsie [name] and he  noted erythema and ulceration and started her on doxycycline. He noted a intact dorsalis pedis pulse.the medial right ankle had skin ulceration which had been treated by Keflex recently and notices made that she had had wound care treatments earlier. past medical history significant for back pain, endometriosis, high cholesterol, hypothyroidism, migraine and venous stasis ulcer treated at [facility]. she has never been a smoker the patient gives a history of right varicose vein stripping done in the year 2000 at [facility]. She has not been wearing her compression since. Electronic Signature(s) Signed: 10/29/2016 9:35:19 AM By: Kim Fudge MD, FACS Previous Signature: 10/29/2016 8:44:04 AM Version By: Kim Fudge MD, FACS Previous Signature: 10/29/2016 8:43:07 AM Version By: Kim Fudge MD, FACS Entered By: [name] on 10/29/2016 09:35:19 [name], [name] ([id number]) -------------------------------------------------------------------------------- Physical Exam Details Patient Name: [name], Kim [name]. Date of Service: 10/29/2016 8:00 AM Medical Record Number: [id number] Patient Account Number: [id number] Date of Birth/Sex: Nov 15, 1940 (76 y.o. Female) Treating RN: [name] Primary Care Provider: Elsie [name] Other Clinician: Referring Provider: Elsie [name] Treating Provider/Extender: [name] in Treatment: 0 Constitutional . Pulse regular. Respirations normal and unlabored. Afebrile. . Eyes Nonicteric. Reactive to light. Ears, Nose, Mouth, and Throat Lips, teeth, and gums WNL.[name] Moist mucosa without lesions. Neck supple and nontender. No palpable supraclavicular or cervical adenopathy. Normal sized without goiter. Respiratory WNL. No retractions.. Cardiovascular Pedal Pulses WNL. ABI on the left is 1.6 for the right is 1.46. No clubbing, cyanosis or edema. Chest Breasts symmetical and no nipple discharge.. Breast tissue WNL, no masses, lumps, or  tenderness.. Gastrointestinal (GI) Abdomen without masses or tenderness.. No liver or spleen enlargement or tenderness.. Lymphatic No adneopathy. No adenopathy. No adenopathy. Musculoskeletal Adexa without tenderness or enlargement.. Digits and nails w/o clubbing, cyanosis, infection, petechiae, ischemia, or inflammatory conditions.. Integumentary (Hair, Skin) No suspicious lesions. No crepitus or fluctuance. No peri-wound warmth or erythema. No masses.[name] Psychiatric Judgement and insight Intact.. No evidence of depression, anxiety, or agitation.. 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 She has got stigmata of hemosiderin deposit and has got an open ulcer which did not need any sharp debridement but was rather tender and inflamed Electronic Signature(s) Signed: 10/29/2016 9:36:08 AM By: Kim Fudge MD, FACS Entered By: [name] on 10/29/2016 09:36:08 [name] ([id number]) [name], Julianna TMarland Kitchen ([id number]) -------------------------------------------------------------------------------- Physician Orders Details Patient Name: [name]. Date of Service: 10/29/2016 8:00 AM Medical Record Number: [id number] Patient Account Number: [id number] Date of Birth/Sex: 24-Oct-1940 (76 y.o. Female) Treating RN: [name] Primary Care Provider: Elsie [name] Other Clinician: Referring Provider: Elsie [name] Treating Provider/Extender: [name] in Treatment: 0 Verbal / Phone Orders: No Diagnosis Coding Wound Cleansing Wound #1 Right,Medial Foot o May Shower, gently pat wound dry prior to applying new dressing. Anesthetic Wound #1 Right,Medial Foot o Topical Lidocaine 4% cream applied to wound bed prior to debridement Primary Wound Dressing Wound #1 Right,Medial Foot o Silvercel Non-Adherent Secondary Dressing Wound #1 Right,Medial  Foot o ABD pad Dressing Change Frequency Wound #1 Right,Medial Foot o Change dressing every week Follow-up Appointments Wound #1 Right,Medial Foot o Return Appointment in 1 week. Edema Control Wound #1 Right,Medial Foot o Kerlix and Coban - Left Lower Extremity Additional Orders / Instructions Wound #1 Right,Medial Foot o Increase protein intake. o Activity as tolerated Services and Therapies [name], Kim [name]. ([id number]) o Arterial Studies- Bilateral o Venous Studies -Bilateral Electronic Signature(s) Signed: 10/29/2016 4:18:43 PM By: Kim Fudge MD, FACS Signed: 10/29/2016 4:29:15 PM By: Gretta Cool, BSN, RN, CWS, Kim RN, BSN Entered By: Gretta Cool, BSN, RN, CWS, Kim on 10/29/2016 09:02:57 Gatton, [name] ([id number]) -------------------------------------------------------------------------------- Problem List Details Patient Name: Arreola, Icy [name]. Date of Service: 10/29/2016 8:00 AM Medical Record Number: [id number] Patient Account Number: [id number] Date of Birth/Sex: 29-Jun-1940 (76 y.o. 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 ([id number]) -------------------------------------------------------------------------------- Progress Note Details Patient Name: Joung, Nicha [name]. Date of Service: 10/29/2016 8:00 AM Medical Record Number: [id number] Patient Account Number: [id number] Date of  Birth/Sex: Nov 16, 1940 (76 y.o. Female) Treating RN: [name] Primary Care Provider: Elsie [name] Other Clinician: Referring Provider: Elsie [name] Treating Provider/Extender: [name] in Treatment: 0 Subjective Chief Complaint Information obtained from Patient Patient presents for treatment of an open ulcer due to venous insufficiency Situated at her right medial ankle for about 3 weeks History of Present Illness (HPI) The following HPI elements were documented for the patient's wound: Location: Patient presents with an ulcer on the right medial ankle. Quality: Patient reports experiencing a dull pain to affected area(s). Severity: Patient states wound are getting worse. Duration: Patient has had the wound for < 4 weeks prior to presenting for treatment Timing: Pain in wound is Intermittent (comes and goes Context: The wound would happen gradually Modifying Factors: Other treatment(s) tried include:local compression stockings recently Associated Signs and Symptoms: Patient reports having increase swelling. 76 year old patient was recently seen by her PCP Dr. Elsie [name] and he noted erythema  and ulceration and started her on doxycycline. He noted a intact dorsalis pedis pulse.the medial right ankle had skin ulceration which had been treated by Keflex recently and notices made that she had had wound care treatments earlier. past medical history significant for back pain, endometriosis, high cholesterol, hypothyroidism, migraine and venous stasis ulcer treated at [facility]. she has never been a smoker the patient gives a history of right varicose vein stripping done in the year 2000 at [facility]. She has not been wearing her compression since. Wound History Patient presents with 1 open wound that has been present for approximately 3 weeks. Patient has been treating wound in the following manner: triple abx ointment. Laboratory tests have not been performed in the  last month. Patient reportedly has not tested positive for an antibiotic resistant organism. Patient reportedly has not tested positive for osteomyelitis. Patient reportedly has had testing performed to evaluate circulation in the legs. Patient History Information obtained from Patient, Chart. Allergies codeine (Reaction: vomiting) [name], Kim [name]. ([id number]) Family History Cancer - Siblings, Heart Disease - Father, Stroke - Mother, No family history of Diabetes, Hypertension, Kidney Disease, Lung Disease, Seizures, Thyroid Problems, Tuberculosis. Social History Never smoker, Marital Status - Married, Alcohol Use - Never, Drug Use - No History, Caffeine Use - Daily. Medical History Eyes Denies history of Cataracts, Glaucoma, Optic Neuritis Ear/Nose/Mouth/Throat Denies history of Chronic sinus problems/congestion, Middle ear problems Hematologic/Lymphatic Denies history of Anemia, Hemophilia, Human Immunodeficiency Virus, Lymphedema, Sickle Cell Disease Respiratory Denies history of Aspiration, Asthma, Chronic Obstructive Pulmonary Disease (COPD), Sleep Apnea, Tuberculosis Cardiovascular Patient has history of Hypertension - medication controlled Denies history of Angina, Arrhythmia, Congestive Heart Failure, Coronary Artery Disease, Deep Vein Thrombosis, Hypotension, Myocardial Infarction, Peripheral Arterial Disease, Peripheral Venous Disease, Phlebitis, Vasculitis Gastrointestinal Denies history of Cirrhosis , Colitis, Crohn s, Hepatitis A, Hepatitis B, Hepatitis C Endocrine Denies history of Type I Diabetes, Type II Diabetes Genitourinary Denies history of End Stage Renal Disease Immunological Denies history of Lupus Erythematosus, Raynaud s Integumentary (Skin) Denies history of History of Burn, History of pressure wounds Musculoskeletal Denies history of Gout, Rheumatoid Arthritis, Osteoarthritis, Osteomyelitis Neurologic Denies history of Dementia, Neuropathy,  Quadriplegia, Paraplegia, Seizure Disorder Oncologic Denies history of Received Chemotherapy, Received Radiation Psychiatric Denies history of Anorexia/bulimia, Confinement Anxiety Medical And Surgical History Notes Cardiovascular Right leg stripped veins (03/1998) Review of Systems (ROS) Constitutional Symptoms (General Health) The patient has no complaints or symptoms. [name], Kim [name]. ([id number]) Eyes Complains or has symptoms of Vision Changes. Denies complaints or symptoms of Dry Eyes, Glasses / Contacts. Ear/Nose/Mouth/Throat The patient has no complaints or symptoms. Hematologic/Lymphatic The patient has no complaints or symptoms. Respiratory The patient has no complaints or symptoms. Cardiovascular The patient has no complaints or symptoms. Gastrointestinal The patient has no complaints or symptoms. Endocrine Complains or has symptoms of Thyroid disease. Genitourinary The patient has no complaints or symptoms. Immunological The patient has no complaints or symptoms. Integumentary (Skin) Complains or has symptoms of Wounds. Denies complaints or symptoms of Bleeding or bruising tendency, Breakdown, Swelling. Musculoskeletal The patient has no complaints or symptoms. Neurologic The patient has no complaints or symptoms. Oncologic The patient has no complaints or symptoms. Psychiatric The patient has no complaints or symptoms. Medications Vitamin D3 simvastatin 40 mg tablet oral tablet oral atenolol 50 mg tablet oral tablet oral Centrum 18 mg-400 mcg tablet oral tablet oral Bayer Chewable Low Dose Aspirin 81 mg tablet oral tablet,chewable oral levothyroxine 100 mcg tablet oral tablet oral Objective [name], Kim  [name]. ([id number]) Constitutional Pulse regular. Respirations normal and unlabored. Afebrile. Vitals Time Taken: 8:17 AM, Height: 68 in, Weight: 191.1 lbs, Source: Measured, BMI: 29.1, Temperature: 98.2 F, Pulse: 68 bpm, Respiratory Rate: 16  breaths/min, Blood Pressure: 138/78 mmHg. Eyes Nonicteric. Reactive to light. Ears, Nose, Mouth, and Throat Lips, teeth, and gums WNL.[name] Moist mucosa without lesions. Neck supple and nontender. No palpable supraclavicular or cervical adenopathy. Normal sized without goiter. Respiratory WNL. No retractions.. Cardiovascular Pedal Pulses WNL. ABI on the left is 1.6 for the right is 1.46. No clubbing, cyanosis or edema. Chest Breasts symmetical and no nipple discharge.. Breast tissue WNL, no masses, lumps, or tenderness.. Gastrointestinal (GI) Abdomen without masses or tenderness.. No liver or spleen enlargement or tenderness.. Lymphatic No adneopathy. No adenopathy. No adenopathy. Musculoskeletal Adexa without tenderness or enlargement.. Digits and nails w/o clubbing, cyanosis, infection, petechiae, ischemia, or inflammatory conditions.[name] Psychiatric Judgement and insight Intact.. No evidence of depression, anxiety, or agitation.. General Notes: patient has stage I lymphedema of the right lower extremity with clear indications of varicose veins and venous stasis of the right lower extremity. She has got stigmata of hemosiderin deposit and has got an open ulcer which did not need any sharp debridement but was rather tender and inflamed Integumentary (Hair, Skin) No suspicious lesions. No crepitus or fluctuance. No peri-wound warmth or erythema. No masses.. Wound #1 status is Open. Original cause of wound was Gradually Appeared. The wound is located on the Right,Medial Ankle. The wound measures 1.1cm length x 1cm width x 0.1cm depth; 0.864cm^2 area and 0.086cm^3 volume. There is Fat Layer (Subcutaneous Tissue) Exposed exposed. There is no tunneling or undermining noted. There is a medium amount of serosanguineous drainage noted. The wound margin is Savidge, Kaelea [name]. ([id number]) flat and intact. There is no granulation within the wound bed. There is a large (67-100%) amount of  necrotic tissue within the wound bed including Adherent Slough. The periwound skin appearance exhibited: Ecchymosis. The periwound skin appearance did not exhibit: Callus, Crepitus, Excoriation, Induration, Rash, Scarring, Dry/Scaly, Maceration, Atrophie Blanche, Cyanosis, Hemosiderin Staining, Mottled, Pallor, Rubor, Erythema. 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 Her ABIs are noncompressible and other than that after thorough review have recommended: 1. Silver alginate and a light Kerlix and Coban dressing to be applied with mild compression to the right lower extremity 2. elevation and exercise 3. She will use 20-30 mm compression stockings on the left lower extremity 4. Arterial duplex study 5. Venous reflux study 6. Regular visits to the wound center Plan Wound Cleansing: Wound #1 Right,Medial Foot: May Shower, gently pat wound dry prior to applying new dressing. Anesthetic: Wound #1 Right,Medial Foot: Topical Lidocaine 4% cream applied to wound bed prior to debridement Primary Wound Dressing: Wound #1 Right,Medial Foot: Silvercel Non-Adherent Secondary Dressing: Wound #1 Right,Medial Foot: ABD pad Dressing Change Frequency: Wound #1 Right,Medial Foot: [name], Kim [name]. ([id number]) Change dressing every week Follow-up Appointments: Wound #1 Right,Medial Foot: Return Appointment in 1 week. Edema Control: Wound #1 Right,Medial Foot: Kerlix and Coban - Left Lower Extremity Additional Orders / Instructions: Wound #1 Right,Medial Foot: Increase protein intake. Activity as tolerated Services and Therapies ordered were: Arterial Studies-  Bilateral, Venous Studies -Bilateral 76 year old  patient who is otherwise very healthy has had a history of varicose vein stripping and now has stigmata of bilateral varicose veins with a ulceration on the right medial ankle. Her ABIs are noncompressible and other than that after thorough review have recommended: 1. Silver alginate and a light Kerlix and Coban dressing to be applied with mild compression to the right lower extremity 2. elevation and exercise 3. She will use 20-30 mm compression stockings on the left lower extremity 4. Arterial duplex study 5. Venous reflux study 6. Regular visits to the wound center Electronic Signature(s) Signed: 10/29/2016 9:37:47 AM By: Kim Fudge MD, FACS Entered By: [name] on 10/29/2016 09:37:47 [name], Dailyn TMarland Kitchen ([id number]) -------------------------------------------------------------------------------- ROS/PFSH Details Patient Name: [name], Jurnee [name]. Date of Service: 10/29/2016 8:00 AM Medical Record Number: [id number] Patient Account Number: [id number] Date of Birth/Sex: 17-Jun-1940 (76 y.o. Female) Treating RN: [name] Primary Care Provider: Elsie [name] Other Clinician: Referring Provider: Elsie [name] Treating Provider/Extender: [name] in Treatment: 0 Information Obtained From Patient Chart Wound History Do you currently have one or more open woundso Yes How many open wounds do you currently haveo 1 Approximately how long have you had your woundso 3 weeks How have you been treating your wound(s) until nowo triple abx ointment Has your wound(s) ever healed and then re-openedo No Have you had any lab work done in the past montho No Have you tested positive for an antibiotic resistant organism (MRSA, VRE)o No Have you tested positive for osteomyelitis (bone infection)o No Have you had any tests for circulation on your legso Yes Eyes Complaints and Symptoms: Positive for: Vision Changes Negative for: Dry  Eyes; Glasses / Contacts Medical History: Negative for: Cataracts; Glaucoma; Optic Neuritis Cardiovascular Complaints and Symptoms: No Complaints or Symptoms Complaints and Symptoms: Negative for: Chest pain; LE edema Medical History: Positive for: Hypertension - medication controlled Negative for: Angina; Arrhythmia; Congestive Heart Failure; Coronary Artery Disease; Deep Vein Thrombosis; Hypotension; Myocardial Infarction; Peripheral Arterial Disease; Peripheral Venous Disease; Phlebitis; Vasculitis Past Medical History Notes: Right leg stripped veins (03/1998) Endocrine Complaints and Symptoms: Positive for: Thyroid disease Steenson, Reiley [name]. ([id number]) Medical History: Negative for: Type I Diabetes; Type II Diabetes Integumentary (Skin) Complaints and Symptoms: Positive for: Wounds Negative for: Bleeding or bruising tendency; Breakdown; Swelling Medical History: Negative for: History of Burn; History of pressure wounds Constitutional Symptoms (General Health) Complaints and Symptoms: No Complaints or Symptoms Ear/Nose/Mouth/Throat Complaints and Symptoms: No Complaints or Symptoms Medical History: Negative for: Chronic sinus problems/congestion; Middle ear problems Hematologic/Lymphatic Complaints and Symptoms: No Complaints or Symptoms Medical History: Negative for: Anemia; Hemophilia; Human Immunodeficiency Virus; Lymphedema; Sickle Cell Disease Respiratory Complaints and Symptoms: No Complaints or Symptoms Medical History: Negative for: Aspiration; Asthma; Chronic Obstructive Pulmonary Disease (COPD); Sleep Apnea; Tuberculosis Gastrointestinal Complaints and Symptoms: No Complaints or Symptoms Medical History: Negative for: Cirrhosis ; Colitis; Crohnos; Hepatitis A; Hepatitis B; Hepatitis C Genitourinary Shad, Raeven [name]. ([id number]) Complaints and Symptoms: No Complaints or Symptoms Medical History: Negative for: End Stage Renal  Disease Immunological Complaints and Symptoms: No Complaints or Symptoms Medical History: Negative for: Lupus Erythematosus; Raynaudos Musculoskeletal Complaints and Symptoms: No Complaints or Symptoms Medical History: Negative for: Gout; Rheumatoid Arthritis; Osteoarthritis; Osteomyelitis Neurologic Complaints and Symptoms: No Complaints or Symptoms Medical History: Negative for: Dementia; Neuropathy; Quadriplegia; Paraplegia; Seizure Disorder Oncologic Complaints and Symptoms: No Complaints or Symptoms Medical History: Negative for: Received Chemotherapy; Received Radiation Psychiatric Complaints and Symptoms: No Complaints or Symptoms Medical History: Negative for: Anorexia/bulimia; Confinement Anxiety Immunizations Pneumococcal  Vaccine: Received Pneumococcal Vaccination: Yes Implantable Devices NIKIAH, GOIN. ([id number]) Family and Social History Cancer: Yes - Siblings; Diabetes: No; Heart Disease: Yes - Father; Hypertension: No; Kidney Disease: No; Lung Disease: No; Seizures: No; Stroke: Yes - Mother; Thyroid Problems: No; Tuberculosis: No; Never smoker; Marital Status - Married; Alcohol Use: Never; Drug Use: No History; Caffeine Use: Daily; Advanced Directives: No; Patient does not want information on Advanced Directives; Do not resuscitate: No; Living Will: No; Medical Power of Attorney: No Physician Affirmation I have reviewed and agree with the above information. Electronic Signature(s) Signed: 10/29/2016 4:18:43 PM By: Kim Fudge MD, FACS Signed: 10/29/2016 4:29:15 PM By: Gretta Cool BSN, RN, CWS, Kim RN, BSN Previous Signature: 10/29/2016 8:31:58 AM Version By: Kim Fudge MD, FACS Entered By: [name] on 10/29/2016 08:33:50 Riebel, Marlon TMarland Kitchen ([id number]) -------------------------------------------------------------------------------- SuperBill Details Patient Name: Gramajo, Yetunde [name]. Date of Service: 10/29/2016 Medical Record Number:  [id number] Patient Account Number: [id number] Date of Birth/Sex: 20-Jan-1940 (76 y.o. 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

## 2016-10-31 NOTE — Progress Notes (Signed)
MONIQUA, ENGEBRETSEN ([id number]) Visit Report for 10/29/2016 Allergy List Details Patient Name: [name], [name] Date of Service: 10/29/2016 8:00 AM Medical Record Number: [id number] Patient Account Number: [id number] Date of Birth/Sex: 12/25/1940 (76 y.o. Female) Treating RN: Cornell Barman Primary Care Cejay Cambre: Elsie Stain Other Clinician: Referring Deovion Batrez: Elsie Stain Treating Bobbi Yount/Extender: Frann Rider in Treatment: 0 Allergies Active Allergies codeine Reaction: vomiting Allergy Notes Electronic Signature(s) Signed: 10/29/2016 4:29:15 PM By: Gretta Cool, BSN, RN, CWS, Kim RN, BSN Entered By: Gretta Cool, BSN, RN, CWS, Kim on 10/29/2016 08:18:47 [name] ([id number]) -------------------------------------------------------------------------------- Arrival Information Details Patient Name: [name], [name] Date of Service: 10/29/2016 8:00 AM Medical Record Number: [id number] Patient Account Number: [id number] Date of Birth/Sex: 1940-05-22 (76 y.o. Female) Treating RN: Cornell Barman Primary Care Anjela Cassara: Elsie Stain Other Clinician: Referring Bertice Risse: Elsie Stain Treating Breshay Ilg/Extender: Frann Rider in Treatment: 0 Visit Information Patient Arrived: Ambulatory Arrival Time: 08:15 Accompanied By: husband Transfer Assistance: None Patient Identification Verified: Yes Secondary Verification Process Yes Completed: Patient Has Alerts: Yes Patient Alerts: Patient on Blood Thinner 81 MG Aspirin NOT DiaBETIC Electronic Signature(s) Signed: 10/29/2016 4:29:15 PM By: Gretta Cool, BSN, RN, CWS, Kim RN, BSN Entered By: Gretta Cool, BSN, RN, CWS, Kim on 10/29/2016 08:16:30 Speagle, Caren Macadam ([id number]) -------------------------------------------------------------------------------- Clinic Level of Care Assessment Details Patient Name: [name], [name] Date of Service: 10/29/2016 8:00 AM Medical Record Number: [id number] Patient Account Number:  [id number] Date of Birth/Sex: August 29, 1940 (76 y.o. Female) Treating RN: Cornell Barman Primary Care Jerrod Damiano: Elsie Stain Other Clinician: Referring Ariyan Sinnett: Elsie Stain Treating Keidrick Murty/Extender: Frann Rider in Treatment: 0 Clinic Level of Care Assessment Items TOOL 2 Quantity Score []  - Use when only an EandM is performed on the INITIAL visit 0 ASSESSMENTS - Nursing Assessment / Reassessment X - General Physical Exam (combine w/ comprehensive assessment (listed just 1 20 below) when performed on new pt. evals) X - Comprehensive Assessment (HX, ROS, Risk Assessments, Wounds Hx, etc.) 1 25 ASSESSMENTS - Wound and Skin Assessment / Reassessment X - Simple Wound Assessment / Reassessment - one wound 1 5 []  - Complex Wound Assessment / Reassessment - multiple wounds 0 []  - Dermatologic / Skin Assessment (not related to wound area) 0 ASSESSMENTS - Ostomy and/or Continence Assessment and Care []  - Incontinence Assessment and Management 0 []  - Ostomy Care Assessment and Management (repouching, etc.) 0 PROCESS - Coordination of Care X - Simple Patient / Family Education for ongoing care 1 15 []  - Complex (extensive) Patient / Family Education for ongoing care 0 X - Staff obtains Programmer, systems, Records, Test Results / Process Orders 1 10 []  - Staff telephones HHA, Nursing Homes / Clarify orders / etc 0 []  - Routine Transfer to another Facility (non-emergent condition) 0 []  - Routine Hospital Admission (non-emergent condition) 0 []  - New Admissions / Biomedical engineer / Ordering NPWT, Apligraf, etc. 0 []  - Emergency Hospital Admission (emergent condition) 0 X - Simple Discharge Coordination 1 10 Dea, Azka T. ([id number]) []  - Complex (extensive) Discharge Coordination 0 PROCESS - Special Needs []  - Pediatric / Minor Patient Management 0 []  - Isolation Patient Management 0 []  - Hearing / Language / Visual special needs 0 []  - Assessment of Community assistance  (transportation, D/C planning, etc.) 0 []  - Additional assistance / Altered mentation 0 []  - Support Surface(s) Assessment (bed, cushion, seat, etc.) 0 INTERVENTIONS - Wound Cleansing / Measurement X - Wound Imaging (photographs - any number of wounds) 1 5 []  - Wound Tracing (instead of photographs) 0 X -  Simple Wound Measurement - one wound 1 5 []  - Complex Wound Measurement - multiple wounds 0 X - Simple Wound Cleansing - one wound 1 5 []  - Complex Wound Cleansing - multiple wounds 0 INTERVENTIONS - Wound Dressings X - Small Wound Dressing one or multiple wounds 1 10 []  - Medium Wound Dressing one or multiple wounds 0 []  - Large Wound Dressing one or multiple wounds 0 []  - Application of Medications - injection 0 INTERVENTIONS - Miscellaneous []  - External ear exam 0 []  - Specimen Collection (cultures, biopsies, blood, body fluids, etc.) 0 []  - Specimen(s) / Culture(s) sent or taken to Lab for analysis 0 []  - Patient Transfer (multiple staff / Civil Service fast streamer / Similar devices) 0 []  - Simple Staple / Suture removal (25 or less) 0 []  - Complex Staple / Suture removal (26 or more) 0 Malkowski, Margery T. ([id number]) []  - Hypo / Hyperglycemic Management (close monitor of Blood Glucose) 0 X - Ankle / Brachial Index (ABI) - do not check if billed separately 1 15 Has the patient been seen at the hospital within the last three years: Yes Total Score: 125 Level Of Care: New/Established - Level 4 Electronic Signature(s) Signed: 10/29/2016 4:29:15 PM By: Gretta Cool, BSN, RN, CWS, Kim RN, BSN Entered By: Gretta Cool, BSN, RN, CWS, Kim on 10/29/2016 09:14:50 Omdahl, Caren Macadam ([id number]) -------------------------------------------------------------------------------- Encounter Discharge Information Details Patient Name: [name], Rossie T. Date of Service: 10/29/2016 8:00 AM Medical Record Number: [id number] Patient Account Number: [id number] Date of Birth/Sex: 11-Jul-1940 (76 y.o. Female) Treating RN: Cornell Barman Primary Care Pearle Wandler: Elsie Stain Other Clinician: Referring Ajeenah Heiny: Elsie Stain Treating Aarin Sparkman/Extender: Frann Rider in Treatment: 0 Encounter Discharge Information Items Discharge Pain Level: 0 Discharge Condition: Stable Ambulatory Status: Ambulatory Discharge Destination: Home Transportation: Private Auto Accompanied By: self Schedule Follow-up Appointment: Yes Medication Reconciliation completed and provided to Patient/Care Yes Homer Miller: Provided on Clinical Summary of Care: 10/29/2016 Form Type Recipient Paper Patient [facility] Electronic Signature(s) Signed: 10/29/2016 4:29:15 PM By: Gretta Cool, BSN, RN, CWS, Kim RN, BSN Entered By: Gretta Cool, BSN, RN, CWS, Kim on 10/29/2016 09:16:54 [name] ([id number]) -------------------------------------------------------------------------------- Lower Extremity Assessment Details Patient Name: [name], [name] Date of Service: 10/29/2016 8:00 AM Medical Record Number: [id number] Patient Account Number: [id number] Date of Birth/Sex: 10-Dec-1940 (76 y.o. Female) Treating RN: Cornell Barman Primary Care Elasha Tess: Elsie Stain Other Clinician: Referring Kamron Portee: Elsie Stain Treating Abriel Geesey/Extender: Frann Rider in Treatment: 0 Edema Assessment Assessed: [Left: Yes] [Right: Yes] Edema: [Left: No] [Right: No] Calf Left: Right: Point of Measurement: 31 cm From Medial Instep cm 36 cm Ankle Left: Right: Point of Measurement: 10 cm From Medial Instep cm 22.5 cm Vascular Assessment Pulses: Dorsalis Pedis Palpable: [Left:Yes] [Right:Yes] Doppler Audible: [Left:Yes] [Right:Yes] Posterior Tibial Palpable: [Left:Yes] [Right:Yes] Doppler Audible: [Left:Yes] [Right:Yes] Extremity colors, hair growth, and conditions: Extremity Color: [Left:Normal] [Right:Normal] Hair Growth on Extremity: [Left:Yes] [Right:Yes] Temperature of Extremity: [Left:Warm] [Right:Warm] Capillary Refill: [Left:< 3 seconds]  [Right:< 3 seconds] Dependent Rubor: [Left:No] [Right:No] Blanched when Elevated: [Left:No] [Right:No] Lipodermatosclerosis: [Left:No] [Right:No] Blood Pressure: Brachial: [Left:122] [Right:122] Dorsalis Pedis: 200 [Left:Dorsalis Pedis: 160] Ankle: Posterior Tibial: 195 [Left:Posterior Tibial: 178 1.64] [Right:1.46] Toe Nail Assessment Left: Right: Thick: No No Kirsten, Anagha T. ([id number]) Discolored: No No Deformed: No No Improper Length and Hygiene: No No Electronic Signature(s) Signed: 10/29/2016 4:29:15 PM By: Gretta Cool, BSN, RN, CWS, Kim RN, BSN Entered By: Gretta Cool, BSN, RN, CWS, Kim on 10/29/2016 08:47:42 Huxtable, Natalija TMarland Kitchen ([id number]) -------------------------------------------------------------------------------- Multi Wound Chart Details Patient Name: [name],  Loria T. Date of Service: 10/29/2016 8:00 AM Medical Record Number: [id number] Patient Account Number: [id number] Date of Birth/Sex: 1940-04-04 (76 y.o. Female) Treating RN: Cornell Barman Primary Care Athalene Kolle: Elsie Stain Other Clinician: Referring Miriam Liles: Elsie Stain Treating Johnie Stadel/Extender: Frann Rider in Treatment: 0 Vital Signs Height(in): 68 Pulse(bpm): 68 Weight(lbs): 191.1 Blood Pressure 138/78 (mmHg): Body Mass Index(BMI): 29 Temperature(F): 98.2 Respiratory Rate 16 (breaths/min): Photos: [N/A:N/A] Wound Location: Right Foot - Medial N/A N/A Wounding Event: Gradually Appeared N/A N/A Primary Etiology: Infection - not elsewhere N/A N/A classified Comorbid History: Hypertension N/A N/A Date Acquired: 10/15/2016 N/A N/A Weeks of Treatment: 0 N/A N/A Wound Status: Open N/A N/A Measurements L x W x D 1.1x1x0.1 N/A N/A (cm) Area (cm) : 0.864 N/A N/A Volume (cm) : 0.086 N/A N/A Classification: Partial Thickness N/A N/A Exudate Amount: Medium N/A N/A Exudate Type: Serosanguineous N/A N/A Exudate Color: red, brown N/A N/A Wound Margin: Flat and Intact N/A N/A Granulation  Amount: None Present (0%) N/A N/A Necrotic Amount: Large (67-100%) N/A N/A Exposed Structures: Fat Layer (Subcutaneous N/A N/A Tissue) Exposed: Yes Fascia: No Tendon: No Muscle: No Campione, Gael T. ([id number]) Joint: No Bone: No Epithelialization: None N/A N/A Periwound Skin Texture: Excoriation: No N/A N/A Induration: No Callus: No Crepitus: No Rash: No Scarring: No Periwound Skin Maceration: No N/A N/A Moisture: Dry/Scaly: No Periwound Skin Color: Ecchymosis: Yes N/A N/A Atrophie Blanche: No Cyanosis: No Erythema: No Hemosiderin Staining: No Mottled: No Pallor: No Rubor: No Tenderness on No N/A N/A Palpation: Wound Preparation: Ulcer Cleansing: N/A N/A Rinsed/Irrigated with Saline Topical Anesthetic Applied: Other: lidocaine 4% Treatment Notes Wound #1 (Right, Medial Foot) 1. Cleansed with: Clean wound with Normal Saline 2. Anesthetic Topical Lidocaine 4% cream to wound bed prior to debridement 4. Dressing Applied: Other dressing (specify in notes) 5. Secondary Dressing Applied ABD Pad Notes Silvercell, kerlix and coban Electronic Signature(s) Signed: 10/29/2016 9:33:39 AM By: Christin Fudge MD, FACS Entered By: Christin Fudge on 10/29/2016 09:33:38 BREAUNNA, GOTTLIEB ([id number]) FAYTHE, HEITZENRATER ([id number]) -------------------------------------------------------------------------------- [place] Details Patient Name: [name], Maisie T. Date of Service: 10/29/2016 8:00 AM Medical Record Number: [id number] Patient Account Number: [id number] Date of Birth/Sex: [birth year] (76 y.o. Female) Treating RN: Cornell Barman Primary Care Jemimah Cressy: Elsie Stain Other Clinician: Referring Azlaan Isidore: Elsie Stain Treating Joriel Streety/Extender: Frann Rider in Treatment: 0 Active Inactive ` Abuse / Safety / Falls / Self Care Management Nursing Diagnoses: Potential for falls Goals: Patient will not experience any injury related to  falls Date Initiated: 10/29/2016 Target Resolution Date: 11/15/2016 Goal Status: Active Interventions: Podiatry chair, stretcher in low position and side rails up as needed Notes: ` Orientation to the Wound Care Program Nursing Diagnoses: Knowledge deficit related to the wound healing center program Goals: Patient/caregiver will verbalize understanding of the [place] Date Initiated: 10/29/2016 Target Resolution Date: 11/15/2016 Goal Status: Active Interventions: Provide education on orientation to the wound center Notes: ` Venous Leg Ulcer Nursing Diagnoses: Potential for venous Insuffiency (use before diagnosis confirmed) Posas, Jozelynn T. ([id number]) Goals: Patient will maintain optimal edema control Date Initiated: 10/29/2016 Target Resolution Date: 11/15/2016 Goal Status: Active Interventions: Assess peripheral edema status every visit. Treatment Activities: Non-invasive vascular studies : 10/29/2016 Notes: ` Wound/Skin Impairment Nursing Diagnoses: Impaired tissue integrity Goals: Ulcer/skin breakdown will heal within 14 weeks Date Initiated: 10/29/2016 Target Resolution Date: 02/14/2017 Goal Status: Active Interventions: Assess ulceration(s) every visit Treatment Activities: Referred to DME Oneil Behney for dressing supplies : 10/29/2016 Notes: Electronic Signature(s) Signed: 10/29/2016  4:29:15 PM By: Gretta Cool, BSN, RN, CWS, Kim RN, BSN Entered By: Gretta Cool, BSN, RN, CWS, Kim on 10/29/2016 08:57:19 Massie, Caren Macadam ([id number]) -------------------------------------------------------------------------------- Pain Assessment Details Patient Name: [name], [name] Date of Service: 10/29/2016 8:00 AM Medical Record Number: [id number] Patient Account Number: [id number] Date of Birth/Sex: [birth year] (76 y.o. Female) Treating RN: Cornell Barman Primary Care Delois Tolbert: Elsie Stain Other Clinician: Referring Briunna Leicht: Elsie Stain Treating  Trendon Zaring/Extender: Frann Rider in Treatment: 0 Active Problems Location of Pain Severity and Description of Pain Patient Has Paino No Site Locations With Dressing Change: No Duration of the Pain. Constant / Intermittento Intermittent Rate the pain. Current Pain Level: 2 Character of Pain Describe the Pain: Burning, Other: stings Pain Management and Medication Current Pain Management: Goals for Pain Management Topical or injectable lidocaine is offered to patient for acute pain when surgical debridement is performed. If needed, Patient is instructed to use over the counter pain medication for the following 24-48 hours after debridement. Wound care MDs do not prescribed pain medications. Patient has chronic pain or uncontrolled pain. Patient has been instructed to make an appointment with their Primary Care Physician for pain management. Electronic Signature(s) Signed: 10/29/2016 4:29:15 PM By: Gretta Cool, BSN, RN, CWS, Kim RN, BSN Entered By: Gretta Cool, BSN, RN, CWS, Kim on 10/29/2016 08:16:59 [name] ([id number]) -------------------------------------------------------------------------------- Patient/Caregiver Education Details Patient Name: Kim Fallen T. Date of Service: 10/29/2016 8:00 AM Medical Record Number: [id number] Patient Account Number: [id number] Date of Birth/Gender: [birth year] (76 y.o. Female) Treating RN: Cornell Barman Primary Care Physician: Elsie Stain Other Clinician: Referring Physician: Elsie Stain Treating Physician/Extender: Frann Rider in Treatment: 0 Education Assessment Education Provided To: Patient Education Topics Provided Venous: Handouts: Controlling Swelling with Multilayered Compression Wraps Methods: Demonstration, Explain/Verbal Responses: State content correctly Welcome To [facility]: Handouts: Welcome To [facility] Methods: Demonstration Responses: State content correctly Electronic  Signature(s) Signed: 10/29/2016 4:29:15 PM By: Gretta Cool, BSN, RN, CWS, Kim RN, BSN Entered By: Gretta Cool, BSN, RN, CWS, Kim on 10/29/2016 09:17:28 [name] ([id number]) -------------------------------------------------------------------------------- Wound Assessment Details Patient Name: [name], [name] Date of Service: 10/29/2016 8:00 AM Medical Record Number: [id number] Patient Account Number: [id number] Date of Birth/Sex: 02-20-1940 (76 y.o. Female) Treating RN: Cornell Barman Primary Care Rossetta Kama: Elsie Stain Other Clinician: Referring Costella Schwarz: Elsie Stain Treating Prosperity Darrough/Extender: Frann Rider in Treatment: 0 Wound Status Wound Number: 1 Primary Etiology: Infection - not elsewhere classified Wound Location: Right Foot - Medial Wound Status: Open Wounding Event: Gradually Appeared Comorbid Hypertension Date Acquired: 10/15/2016 History: Weeks Of Treatment: 0 Clustered Wound: No Photos Wound Measurements Length: (cm) 1.1 % Reduction i Width: (cm) 1 % Reduction i Depth: (cm) 0.1 Epithelializa Area: (cm) 0.864 Tunneling: Volume: (cm) 0.086 Undermining: n Area: n Volume: tion: None No No Wound Description Classification: Partial Thickness Foul Odor Aft Wound Margin: Flat and Intact Slough/Fibrin Exudate Amount: Medium Exudate Type: Serosanguineous Exudate Color: red, brown er Cleansing: No o Yes Wound Bed Granulation Amount: None Present (0%) Exposed Structure Necrotic Amount: Large (67-100%) Fascia Exposed: No Necrotic Quality: Adherent Slough Fat Layer (Subcutaneous Tissue) Exposed: Yes Tendon Exposed: No Muscle Exposed: No Joint Exposed: No Bone Exposed: No Bur, Rosmary T. ([id number]) Periwound Skin Texture Texture Color No Abnormalities Noted: No No Abnormalities Noted: No Callus: No Atrophie Blanche: No Crepitus: No Cyanosis: No Excoriation: No Ecchymosis: Yes Induration: No Erythema: No Rash: No Hemosiderin  Staining: No Scarring: No Mottled: No Pallor: No Moisture Rubor: No No Abnormalities Noted: No Dry /  Scaly: No Maceration: No Wound Preparation Ulcer Cleansing: Rinsed/Irrigated with Saline Topical Anesthetic Applied: Other: lidocaine 4%, Electronic Signature(s) Signed: 10/29/2016 4:29:15 PM By: Gretta Cool, BSN, RN, CWS, Kim RN, BSN Entered By: Gretta Cool, BSN, RN, CWS, Kim on 10/29/2016 08:33:35 Kirwan, Caren Macadam ([id number]) -------------------------------------------------------------------------------- Vitals Details Patient Name: [name], Amiee T. Date of Service: 10/29/2016 8:00 AM Medical Record Number: [id number] Patient Account Number: [id number] Date of Birth/Sex: [birth year] (76 y.o. Female) Treating RN: Cornell Barman Primary Care Janina Trafton: Elsie Stain Other Clinician: Referring Marriah Sanderlin: Elsie Stain Treating Ireene Ballowe/Extender: Frann Rider in Treatment: 0 Vital Signs Time Taken: 08:17 Temperature (F): 98.2 Height (in): 68 Pulse (bpm): 68 Weight (lbs): 191.1 Respiratory Rate (breaths/min): 16 Source: Measured Blood Pressure (mmHg): 138/78 Body Mass Index (BMI): 29.1 Reference Range: 80 - 120 mg / dl Electronic Signature(s) Signed: 10/29/2016 4:29:15 PM By: Gretta Cool, BSN, RN, CWS, Kim RN, BSN Entered By: Gretta Cool, BSN, RN, CWS, Kim on 10/29/2016 97:28:20

## 2016-11-03 ENCOUNTER — Other Ambulatory Visit (INDEPENDENT_AMBULATORY_CARE_PROVIDER_SITE_OTHER): Payer: Medicare Other

## 2016-11-03 ENCOUNTER — Other Ambulatory Visit (INDEPENDENT_AMBULATORY_CARE_PROVIDER_SITE_OTHER): Payer: Self-pay | Admitting: Vascular Surgery

## 2016-11-03 DIAGNOSIS — R52 Pain, unspecified: Secondary | ICD-10-CM | POA: Diagnosis not present

## 2016-11-04 DIAGNOSIS — H353132 Nonexudative age-related macular degeneration, bilateral, intermediate dry stage: Secondary | ICD-10-CM | POA: Diagnosis not present

## 2016-11-04 DIAGNOSIS — H5203 Hypermetropia, bilateral: Secondary | ICD-10-CM | POA: Diagnosis not present

## 2016-11-05 ENCOUNTER — Encounter: Payer: Medicare Other | Admitting: Surgery

## 2016-11-05 DIAGNOSIS — I10 Essential (primary) hypertension: Secondary | ICD-10-CM | POA: Diagnosis not present

## 2016-11-05 DIAGNOSIS — I83013 Varicose veins of right lower extremity with ulcer of ankle: Secondary | ICD-10-CM | POA: Diagnosis not present

## 2016-11-05 DIAGNOSIS — L97312 Non-pressure chronic ulcer of right ankle with fat layer exposed: Secondary | ICD-10-CM | POA: Diagnosis not present

## 2016-11-05 DIAGNOSIS — Z79899 Other long term (current) drug therapy: Secondary | ICD-10-CM | POA: Diagnosis not present

## 2016-11-05 DIAGNOSIS — I872 Venous insufficiency (chronic) (peripheral): Secondary | ICD-10-CM | POA: Diagnosis not present

## 2016-11-08 NOTE — Progress Notes (Signed)
GERALDIN, HABERMEHL ([id number]) Visit Report for 11/05/2016 Arrival Information Details Patient Name: [name], [name]. Date of Service: 11/05/2016 8:15 AM Medical Record Number: [id number] Patient Account Number: [id number] Date of Birth/Sex: 1940/04/04 (76 y.o. Female) Treating RN: Cornell Barman Primary Care Linnea Todisco: Elsie Stain Other Clinician: Referring Edelmiro Innocent: Elsie Stain Treating Raynette Arras/Extender: Frann Rider in Treatment: 1 Visit Information History Since Last Visit Added or deleted any medications: No Patient Arrived: Ambulatory Any new allergies or adverse reactions: No Arrival Time: 08:16 Had a fall or experienced change in No Accompanied By: husband activities of daily living that may affect Transfer Assistance: None risk of falls: Patient Identification Verified: Yes Signs or symptoms of abuse/neglect since last visito No Secondary Verification Process Yes Hospitalized since last visit: No Completed: Has Dressing in Place as Prescribed: Yes Patient Has Alerts: Yes Pain Present Now: No Patient Alerts: Patient on Blood Thinner 81 MG Aspirin NOT DiaBETIC Electronic Signature(s) Signed: 11/05/2016 4:13:04 PM By: Gretta Cool, BSN, RN, CWS, Kim RN, BSN Entered By: Gretta Cool, BSN, RN, CWS, Kim on 11/05/2016 08:18:47 [name] ([id number]) -------------------------------------------------------------------------------- Encounter Discharge Information Details Patient Name: [name], [name] Date of Service: 11/05/2016 8:15 AM Medical Record Number: [id number] Patient Account Number: [id number] Date of Birth/Sex: 1940/08/20 (76 y.o. Female) Treating RN: Cornell Barman Primary Care Valeda Corzine: Elsie Stain Other Clinician: Referring Jerome Otter: Elsie Stain Treating Danaisha Celli/Extender: Frann Rider in Treatment: 1 Encounter Discharge Information Items Discharge Pain Level: 0 Discharge Condition: Stable Ambulatory Status: Ambulatory Discharge  Destination: Home Transportation: Private Auto Accompanied By: self Schedule Follow-up Appointment: Yes Medication Reconciliation completed and Yes provided to Patient/Care Cloyde Oregel: Provided on Clinical Summary of Care: 11/05/2016 Form Type Recipient Paper Patient [facility] Electronic Signature(s) Signed: 11/05/2016 4:13:04 PM By: Gretta Cool, BSN, RN, CWS, Kim RN, BSN Entered By: Gretta Cool, BSN, RN, CWS, Kim on 11/05/2016 08:55:27 [name] ([id number]) -------------------------------------------------------------------------------- Lower Extremity Assessment Details Patient Name: [name], [name] Date of Service: 11/05/2016 8:15 AM Medical Record Number: [id number] Patient Account Number: [id number] Date of Birth/Sex: 07-05-1940 (76 y.o. Female) Treating RN: Cornell Barman Primary Care Sheyanne Munley: Elsie Stain Other Clinician: Referring Elie Gragert: Elsie Stain Treating Joelee Snoke/Extender: Frann Rider in Treatment: 1 Vascular Assessment Pulses: Dorsalis Pedis Palpable: [Right:Yes] Posterior Tibial Extremity colors, hair growth, and conditions: Extremity Color: [Right:Normal] Hair Growth on Extremity: [Right:No] Temperature of Extremity: [Right:Warm] Capillary Refill: [Right:< 3 seconds] Toe Nail Assessment Left: Right: Thick: No Discolored: No Deformed: No Improper Length and Hygiene: No Electronic Signature(s) Signed: 11/05/2016 4:13:04 PM By: Gretta Cool, BSN, RN, CWS, Kim RN, BSN Entered By: Gretta Cool, BSN, RN, CWS, Kim on 11/05/2016 08:28:02 [name] ([id number]) -------------------------------------------------------------------------------- Multi Wound Chart Details Patient Name: [name], [name] Date of Service: 11/05/2016 8:15 AM Medical Record Number: [id number] Patient Account Number: [id number] Date of Birth/Sex: Jul 14, 1940 (76 y.o. Female) Treating RN: Cornell Barman Primary Care Tanganyika Bowlds: Elsie Stain Other Clinician: Referring Caydee Talkington: Elsie Stain Treating Dequavious Harshberger/Extender: Frann Rider in Treatment: 1 Vital Signs Height(in): 68 Pulse(bpm): 72 Weight(lbs): 191.1 Blood Pressure(mmHg): 127/66 Body Mass Index(BMI): 29 Temperature(F): 97.9 Respiratory Rate 16 (breaths/min): Photos: [N/A:N/A] Wound Location: Right Ankle - Medial N/A N/A Wounding Event: Gradually Appeared N/A N/A Primary Etiology: To be determined N/A N/A Comorbid History: Hypertension N/A N/A Date Acquired: 10/15/2016 N/A N/A Weeks of Treatment: 1 N/A N/A Wound Status: Open N/A N/A Measurements L x W x D 0.6x0.5x0.1 N/A N/A (cm) Area (cm) : 0.236 N/A N/A Volume (cm) : 0.024 N/A N/A % Reduction in Area: 72.70% N/A N/A % Reduction in  Volume: 72.10% N/A N/A Classification: Partial Thickness N/A N/A Exudate Amount: Medium N/A N/A Exudate Type: Serosanguineous N/A N/A Exudate Color: red, brown N/A N/A Wound Margin: Flat and Intact N/A N/A Granulation Amount: None Present (0%) N/A N/A Necrotic Amount: Large (67-100%) N/A N/A Exposed Structures: Fat Layer (Subcutaneous N/A N/A Tissue) Exposed: Yes Fascia: No Tendon: No Muscle: No Joint: No Bone: No Epithelialization: None N/A N/A Debridement: Open Wound/Selective N/A N/A [phone]) - Selective 08:40 N/A N/A BIANEY, TESORO ([id number]) Pre-procedure Verification/Time Out Taken: Pain Control: Other N/A N/A Tissue Debrided: Exudates N/A N/A Level: Non-Viable Tissue N/A N/A Debridement Area (sq cm): 0.3 N/A N/A Instrument: Curette N/A N/A Bleeding: Minimum N/A N/A Hemostasis Achieved: Silver Nitrate N/A N/A Procedural Pain: 0 N/A N/A Post Procedural Pain: 0 N/A N/A Debridement Treatment Procedure was tolerated well N/A N/A Response: Post Debridement 0.6x0.5x0.2 N/A N/A Measurements L x W x D (cm) Post Debridement Volume: 0.047 N/A N/A (cm) Periwound Skin Texture: Excoriation: No N/A N/A Induration: No Callus: No Crepitus: No Rash: No Scarring: No Periwound Skin  Moisture: Maceration: No N/A N/A Dry/Scaly: No Periwound Skin Color: Ecchymosis: Yes N/A N/A Atrophie Blanche: No Cyanosis: No Erythema: No Hemosiderin Staining: No Mottled: No Pallor: No Rubor: No Tenderness on Palpation: No N/A N/A Wound Preparation: Ulcer Cleansing: N/A N/A Rinsed/Irrigated with Saline Topical Anesthetic Applied: Other: lidocaine 4% Procedures Performed: Debridement N/A N/A Treatment Notes Electronic Signature(s) Signed: 11/05/2016 8:52:09 AM By: Christin Fudge MD, FACS Entered By: Christin Fudge on 11/05/2016 08:52:09 TRACIE, DORE ([id number]) -------------------------------------------------------------------------------- [place] Details Patient Name: [name], [name] Date of Service: 11/05/2016 8:15 AM Medical Record Number: [id number] Patient Account Number: [id number] Date of Birth/Sex: Jun 16, 1940 (76 y.o. Female) Treating RN: Cornell Barman Primary Care Evaleigh Mccamy: Elsie Stain Other Clinician: Referring Aaralynn Shepheard: Elsie Stain Treating Meshach Perry/Extender: Frann Rider in Treatment: 1 Active Inactive ` Abuse / Safety / Falls / Self Care Management Nursing Diagnoses: Potential for falls Goals: Patient will not experience any injury related to falls Date Initiated: 10/29/2016 Target Resolution Date: 11/15/2016 Goal Status: Active Interventions: Podiatry chair, stretcher in low position and side rails up as needed Notes: ` Orientation to the Wound Care Program Nursing Diagnoses: Knowledge deficit related to the wound healing center program Goals: Patient/caregiver will verbalize understanding of the [place] Date Initiated: 10/29/2016 Target Resolution Date: 11/15/2016 Goal Status: Active Interventions: Provide education on orientation to the wound center Notes: ` Venous Leg Ulcer Nursing Diagnoses: Potential for venous Insuffiency (use before diagnosis confirmed) Goals: Patient will  maintain optimal edema control Date Initiated: 10/29/2016 Target Resolution Date: 11/15/2016 Goal Status: Active Interventions: Gasiorowski, Rivers T. ([id number]) Assess peripheral edema status every visit. Treatment Activities: Non-invasive vascular studies : 10/29/2016 Notes: ` Wound/Skin Impairment Nursing Diagnoses: Impaired tissue integrity Goals: Ulcer/skin breakdown will heal within 14 weeks Date Initiated: 10/29/2016 Target Resolution Date: 02/14/2017 Goal Status: Active Interventions: Assess ulceration(s) every visit Treatment Activities: Referred to DME Keylin Podolsky for dressing supplies : 10/29/2016 Notes: Electronic Signature(s) Signed: 11/05/2016 4:13:04 PM By: Gretta Cool, BSN, RN, CWS, Kim RN, BSN Entered By: Gretta Cool, BSN, RN, CWS, Kim on 11/05/2016 08:28:06 Cervantez, [name] ([id number]) -------------------------------------------------------------------------------- Pain Assessment Details Patient Name: [name], [name] Date of Service: 11/05/2016 8:15 AM Medical Record Number: [id number] Patient Account Number: [id number] Date of Birth/Sex: 02-20-1940 (76 y.o. Female) Treating RN: Cornell Barman Primary Care Grae Leathers: Elsie Stain Other Clinician: Referring Elwin Tsou: Elsie Stain Treating Raymont Andreoni/Extender: Frann Rider in Treatment: 1 Active Problems Location of Pain Severity and Description  of Pain Patient Has Paino Yes Site Locations Pain Location: Pain in Ulcers Duration of the Pain. Constant / Intermittento Intermittent Rate the pain. Current Pain Level: 0 Worst Pain Level: 3 Character of Pain Describe the Pain: Burning, Other: stinging Pain Management and Medication Current Pain Management: Goals for Pain Management Topical or injectable lidocaine is offered to patient for acute pain when surgical debridement is performed. If needed, Patient is instructed to use over the counter pain medication for the following 24-48 hours after debridement.  Wound care MDs do not prescribed pain medications. Patient has chronic pain or uncontrolled pain. Patient has been instructed to make an appointment with their Primary Care Physician for pain management. Electronic Signature(s) Signed: 11/05/2016 4:13:04 PM By: Gretta Cool, BSN, RN, CWS, Kim RN, BSN Entered By: Gretta Cool, BSN, RN, CWS, Kim on 11/05/2016 08:19:52 [name] ([id number]) -------------------------------------------------------------------------------- Patient/Caregiver Education Details Patient Name: [name] Date of Service: 11/05/2016 8:15 AM Medical Record Number: [id number] Patient Account Number: [id number] Date of Birth/Gender: 12-29-1940 (76 y.o. Female) Treating RN: Cornell Barman Primary Care Physician: Elsie Stain Other Clinician: Referring Physician: Elsie Stain Treating Physician/Extender: Frann Rider in Treatment: 1 Education Assessment Education Provided To: Patient Education Topics Provided Wound Debridement: Handouts: Wound Debridement Methods: Demonstration, Explain/Verbal Responses: State content correctly Wound/Skin Impairment: Handouts: Caring for Your Ulcer Methods: Demonstration, Explain/Verbal Responses: State content correctly Electronic Signature(s) Signed: 11/05/2016 4:13:04 PM By: Gretta Cool, BSN, RN, CWS, Kim RN, BSN Entered By: Gretta Cool, BSN, RN, CWS, Kim on 11/05/2016 08:55:46 Whalley, [name] ([id number]) -------------------------------------------------------------------------------- Wound Assessment Details Patient Name: [name], [name] Date of Service: 11/05/2016 8:15 AM Medical Record Number: [id number] Patient Account Number: [id number] Date of Birth/Sex: 08/18/1940 (76 y.o. Female) Treating RN: Cornell Barman Primary Care Ada Holness: Elsie Stain Other Clinician: Referring Michaelyn Wall: Elsie Stain Treating Isam Unrein/Extender: Frann Rider in Treatment: 1 Wound Status Wound Number: 1 Primary Etiology: To be  determined Wound Location: Right Ankle - Medial Wound Status: Open Wounding Event: Gradually Appeared Comorbid History: Hypertension Date Acquired: 10/15/2016 Weeks Of Treatment: 1 Clustered Wound: No Photos Wound Measurements Length: (cm) 0.6 Width: (cm) 0.5 Depth: (cm) 0.1 Area: (cm) 0.236 Volume: (cm) 0.024 % Reduction in Area: 72.7% % Reduction in Volume: 72.1% Epithelialization: None Tunneling: No Undermining: No Wound Description Classification: Partial Thickness Wound Margin: Flat and Intact Exudate Amount: Medium Exudate Type: Serosanguineous Exudate Color: red, brown Foul Odor After Cleansing: No Slough/Fibrino Yes Wound Bed Granulation Amount: None Present (0%) Exposed Structure Necrotic Amount: Large (67-100%) Fascia Exposed: No Necrotic Quality: Adherent Slough Fat Layer (Subcutaneous Tissue) Exposed: Yes Tendon Exposed: No Muscle Exposed: No Joint Exposed: No Bone Exposed: No Periwound Skin Texture Texture Color No Abnormalities Noted: No No Abnormalities Noted: No Callus: No Atrophie Blanche: No Crepitus: No Cyanosis: No [name], [name] ([id number]) Excoriation: No Ecchymosis: Yes Induration: No Erythema: No Rash: No Hemosiderin Staining: No Scarring: No Mottled: No Pallor: No Moisture Rubor: No No Abnormalities Noted: No Dry / Scaly: No Maceration: No Wound Preparation Ulcer Cleansing: Rinsed/Irrigated with Saline Topical Anesthetic Applied: Other: lidocaine 4%, Treatment Notes Wound #1 (Right, Medial Ankle) 1. Cleansed with: Clean wound with Normal Saline 2. Anesthetic Topical Lidocaine 4% cream to wound bed prior to debridement 4. Dressing Applied: Aquacel Ag Notes kerlix and Event organiser) Signed: 11/05/2016 4:13:04 PM By: Gretta Cool, BSN, RN, CWS, Kim RN, BSN Entered By: Gretta Cool, BSN, RN, CWS, Kim on 11/05/2016 08:24:32 [name]  ([id number]) -------------------------------------------------------------------------------- [place] Details Patient Name: [name], [name] Date of Service: 11/05/2016  8:15 AM Medical Record Number: [id number] Patient Account Number: [id number] Date of Birth/Sex: 1940/11/14 (76 y.o. Female) Treating RN: Cornell Barman Primary Care Chrissie Dacquisto: Elsie Stain Other Clinician: Referring Deserie Dirks: Elsie Stain Treating Morty Ortwein/Extender: Frann Rider in Treatment: 1 Vital Signs Time Taken: 08:19 Temperature (F): 97.9 Height (in): 68 Pulse (bpm): 72 Weight (lbs): 191.1 Respiratory Rate (breaths/min): 16 Body Mass Index (BMI): 29.1 Blood Pressure (mmHg): 127/66 Reference Range: 80 - 120 mg / dl Electronic Signature(s) Signed: 11/05/2016 4:13:04 PM By: Gretta Cool, BSN, RN, CWS, Kim RN, BSN Entered By: Gretta Cool, BSN, RN, CWS, Kim on 11/05/2016 08:20:36

## 2016-11-08 NOTE — Progress Notes (Signed)
Kim, [name] ([id number]) Visit Report for 11/05/2016 Chief Complaint Document Details Patient Name: [name], [name] Date of Service: 11/05/2016 8:15 AM Medical Record Number: [id number] Patient Account Number: [id number] Date of Birth/Sex: [birth year] (76 y.o. Female) Treating RN: Cornell Barman Primary Care Provider: Elsie Stain Other Clinician: Referring Provider: Elsie Stain Treating Provider/Extender: Frann Rider in Treatment: 1 Information Obtained from: Patient Chief Complaint Patient presents for treatment of an open ulcer due to venous insufficiency Situated at her right medial ankle for about 3 weeks Electronic Signature(s) Signed: 11/05/2016 8:53:45 AM By: Christin Fudge MD, FACS Entered By: Christin Fudge on 11/05/2016 08:53:44 Mangold, Katianne T. ([id number]) -------------------------------------------------------------------------------- Debridement Details Patient Name: Kim, [name] Date of Service: 11/05/2016 8:15 AM Medical Record Number: [id number] Patient Account Number: [id number] Date of Birth/Sex: 07-29-1940 (76 y.o. Female) Treating RN: Cornell Barman Primary Care Provider: Elsie Stain Other Clinician: Referring Provider: Elsie Stain Treating Provider/Extender: Frann Rider in Treatment: 1 Debridement Performed for Wound #1 Right,Medial Ankle Assessment: Performed By: Physician Christin Fudge, MD Debridement: Open Wound/Selective Debridement Description: Selective Pre-procedure Verification/Time Yes - 08:40 Out Taken: Start Time: 08:41 Pain Control: Other : lidocaine 4% Level: Non-Viable Tissue Total Area Debrided (L x W): 0.6 (cm) x 0.5 (cm) = 0.3 (cm) Tissue and other material Non-Viable, Exudate, Other debrided: Instrument: Other : Saline gauze Bleeding: Minimum Hemostasis Achieved: Silver Nitrate End Time: 08:43 Procedural Pain: 0 Post Procedural Pain: 0 Response to Treatment: Procedure was tolerated well Post  Debridement Measurements of Total Wound Length: (cm) 0.6 Width: (cm) 0.5 Depth: (cm) 0.2 Volume: (cm) 0.047 Character of Wound/Ulcer Post Debridement: Requires Further Debridement Post Procedure Diagnosis Same as Pre-procedure Electronic Signature(s) Signed: 11/05/2016 8:53:35 AM By: Christin Fudge MD, FACS Signed: 11/05/2016 4:13:04 PM By: Gretta Cool, BSN, RN, CWS, Kim RN, BSN Entered By: Christin Fudge on 11/05/2016 08:53:34 Amero, Illyria T. ([id number]) -------------------------------------------------------------------------------- HPI Details Patient Name: Kim, [name] Date of Service: 11/05/2016 8:15 AM Medical Record Number: [id number] Patient Account Number: [id number] Date of Birth/Sex: 1940-04-16 (76 y.o. Female) Treating RN: Cornell Barman Primary Care Provider: Elsie Stain Other Clinician: Referring Provider: Elsie Stain Treating Provider/Extender: Frann Rider in Treatment: 1 History of Present Illness Location: Patient presents with an ulcer on the right medial ankle. Quality: Patient reports experiencing a dull pain to affected area(s). Severity: Patient states wound are getting worse. Duration: Patient has had the wound for < 4 weeks prior to presenting for treatment Timing: Pain in wound is Intermittent (comes and goes Context: The wound would happen gradually Modifying Factors: Other treatment(s) tried include:local compression stockings recently Associated Signs and Symptoms: Patient reports having increase swelling. HPI Description: 76 year old patient was recently seen by her PCP [name] and he noted erythema and ulceration and started her on doxycycline. He noted a intact dorsalis pedis pulse.the medial right ankle had skin ulceration which had been treated by Keflex recently and notices made that she had had wound care treatments earlier. past medical history significant for back pain, endometriosis, high cholesterol, hypothyroidism,  migraine and venous stasis ulcer treated at [facility]. she has never been a smoker the patient gives a history of right varicose vein stripping done in the year 2000 at [facility]. She has not been wearing her compression since. 11/05/2016 -- the venous duplex study has been done for reflux and we are awaiting her arterial study to be done. The patient is of a very nervous disposition and seems to be very anxious. Electronic Signature(s) Signed: 11/05/2016 8:54:20 AM  By: Christin Fudge MD, FACS Entered By: Christin Fudge on 11/05/2016 08:54:20 [name] ([id number]) -------------------------------------------------------------------------------- Physical Exam Details Patient Name: Kim, [name] Date of Service: 11/05/2016 8:15 AM Medical Record Number: [id number] Patient Account Number: [id number] Date of Birth/Sex: [birth year] (76 y.o. Female) Treating RN: Cornell Barman Primary Care Provider: Elsie Stain Other Clinician: Referring Provider: Elsie Stain Treating Provider/Extender: Frann Rider in Treatment: 1 Constitutional . Pulse regular. Respirations normal and unlabored. Afebrile. . Eyes Nonicteric. Reactive to light. Ears, Nose, Mouth, and Throat Lips, teeth, and gums WNL.[name] Moist mucosa without lesions. Neck supple and nontender. No palpable supraclavicular or cervical adenopathy. Normal sized without goiter. Respiratory WNL. No retractions.. Cardiovascular Pedal Pulses WNL. No clubbing, cyanosis or edema. Lymphatic No adneopathy. No adenopathy. No adenopathy. Musculoskeletal Adexa without tenderness or enlargement.. Digits and nails w/o clubbing, cyanosis, infection, petechiae, ischemia, or inflammatory conditions.. Integumentary (Hair, Skin) No suspicious lesions. No crepitus or fluctuance. No peri-wound warmth or erythema. No masses.[name] Psychiatric Judgement and insight Intact.. No evidence of depression, anxiety, or agitation.. Notes the ulcerated  area on the right medial ankle had significant amount of slough and I washed it out with a moist saline gauze and the abrasive technique. Minimal bleeding controlled with Silver nitrate stick. Electronic Signature(s) Signed: 11/05/2016 8:55:01 AM By: Christin Fudge MD, FACS Entered By: Christin Fudge on 11/05/2016 08:55:01 [name] ([id number]) -------------------------------------------------------------------------------- Physician Orders Details Patient Name: [name], Shanea T. Date of Service: 11/05/2016 8:15 AM Medical Record Number: [id number] Patient Account Number: [id number] Date of Birth/Sex: 1940-08-05 (76 y.o. Female) Treating RN: Cornell Barman Primary Care Provider: Elsie Stain Other Clinician: Referring Provider: Elsie Stain Treating Provider/Extender: Frann Rider in Treatment: 1 Verbal / Phone Orders: No Diagnosis Coding Wound Cleansing Wound #1 Right,Medial Ankle o May Shower, gently pat wound dry prior to applying new dressing. Anesthetic Wound #1 Right,Medial Ankle o Topical Lidocaine 4% cream applied to wound bed prior to debridement Primary Wound Dressing Wound #1 Right,Medial Ankle o Aquacel Ag Secondary Dressing Wound #1 Right,Medial Ankle o ABD pad Dressing Change Frequency Wound #1 Right,Medial Ankle o Change dressing every week Follow-up Appointments Wound #1 Right,Medial Ankle o Return Appointment in 1 week. Edema Control Wound #1 Right,Medial Ankle o Kerlix and Coban - Left Lower Extremity Additional Orders / Instructions Wound #1 Right,Medial Ankle o Increase protein intake. o Activity as tolerated Electronic Signature(s) Signed: 11/05/2016 12:29:29 PM By: Christin Fudge MD, FACS Signed: 11/05/2016 4:13:04 PM By: Gretta Cool, BSN, RN, CWS, Kim RN, BSN Entered By: Gretta Cool, BSN, RN, CWS, Kim on 11/05/2016 08:44:34 Finazzo, Marcina Darene Lamer  ([id number]) -------------------------------------------------------------------------------- Problem List Details Patient Name: Mackowski, Estellar T. Date of Service: 11/05/2016 8:15 AM Medical Record Number: [id number] Patient Account Number: [id number] Date of Birth/Sex: [birth year] (76 y.o. 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 ([id number]) -------------------------------------------------------------------------------- Progress Note Details Patient Name: Cervini, Giamarie T. Date of Service: 11/05/2016 8:15 AM Medical Record Number: [id number] Patient Account Number: [id number] Date of Birth/Sex: 04-09-1940 (76 y.o. Female) Treating RN: Cornell Barman Primary Care Provider: Elsie Stain Other Clinician: Referring Provider:  Elsie Stain Treating Provider/Extender: Frann Rider in Treatment: 1 Subjective Chief Complaint Information obtained from Patient Patient presents for treatment of an open ulcer due to venous insufficiency Situated at her right medial ankle for about 3 weeks History of Present Illness (HPI) The following HPI elements were documented for the patient's wound: Location: Patient presents with an ulcer on the right medial ankle. Quality: Patient reports experiencing a dull pain to affected area(s). Severity: Patient states wound are getting worse. Duration: Patient has had the wound for < 4  weeks prior to presenting for treatment Timing: Pain in wound is Intermittent (comes and goes Context: The wound would happen gradually Modifying Factors: Other treatment(s) tried include:local compression stockings recently Associated Signs and Symptoms: Patient reports having increase swelling. 76 year old patient was recently seen by her PCP [name] and he noted erythema and ulceration and started her on doxycycline. He noted a intact dorsalis pedis pulse.the medial right ankle had skin ulceration which had been treated by Keflex recently and notices made that she had had wound care treatments earlier. past medical history significant for back pain, endometriosis, high cholesterol, hypothyroidism, migraine and venous stasis ulcer treated at [facility]. she has never been a smoker the patient gives a history of right varicose vein stripping done in the year 2000 at [facility]. She has not been wearing her compression since. 11/05/2016 -- the venous duplex study has been done for reflux and we are awaiting her arterial study to be done. The patient is of a very nervous disposition and seems to be very anxious. Objective Constitutional Pulse regular. Respirations normal and unlabored. Afebrile. Vitals Time Taken: 8:19 AM, Height: 68 in, Weight: 191.1 lbs, BMI: 29.1, Temperature: 97.9 F, Pulse: 72 bpm, Respiratory Rate: 16 breaths/min, Blood Pressure: 127/66 mmHg. Eyes Nonicteric. Reactive to light. Stepanek, Livier T. ([id number]) Ears, Nose, Mouth, and Throat Lips, teeth, and gums WNL.[name] Moist mucosa without lesions. Neck supple and nontender. No palpable supraclavicular or cervical adenopathy. Normal sized without goiter. Respiratory WNL. No retractions.. Cardiovascular Pedal Pulses WNL. No clubbing, cyanosis or edema. Lymphatic No adneopathy. No adenopathy. No adenopathy. Musculoskeletal Adexa without tenderness or enlargement.. Digits and nails w/o clubbing, cyanosis,  infection, petechiae, ischemia, or inflammatory conditions.[name] Psychiatric Judgement and insight Intact.. No evidence of depression, anxiety, or agitation.. General Notes: the ulcerated area on the right medial ankle had significant amount of slough and I washed it out with a moist saline gauze and the abrasive technique. Minimal bleeding controlled with Silver nitrate stick. Integumentary (Hair, Skin) No suspicious lesions. No crepitus or fluctuance. No peri-wound warmth or erythema. No masses.. Wound #1 status is Open. Original cause of wound was Gradually Appeared. The wound is located on the Right,Medial Ankle. The wound measures 0.6cm length x 0.5cm width x 0.1cm depth; 0.236cm^2 area and 0.024cm^3 volume. There is Fat Layer (Subcutaneous Tissue) Exposed exposed. There is no tunneling or undermining noted. There is a medium amount of serosanguineous drainage noted. The wound margin is flat and intact. There is no granulation within the wound bed. There is a large (67-100%) amount of necrotic tissue within the wound bed including Adherent Slough. The periwound skin appearance exhibited: Ecchymosis. The periwound skin appearance did not exhibit: Callus, Crepitus, Excoriation, Induration, Rash, Scarring, Dry/Scaly, Maceration, Atrophie Blanche, Cyanosis, Hemosiderin Staining, Mottled, Pallor, Rubor, Erythema. 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 There was a Non-Viable Tissue Open Wound/Selective [phone]) debridement with total area of 0.3 sq cm performed by Christin Fudge, MD. with [name] ([id number]) the following instrument(s): Saline gauze to remove Non-Viable tissue/material including Exudate and Other after  achieving pain control using Other (lidocaine 4%). A time out was conducted at 08:40, prior to the start of the procedure. A Minimum amount of bleeding was controlled with Silver Nitrate. The procedure was tolerated well with a pain level of 0 throughout and a pain level of 0 following the procedure. Post Debridement Measurements: 0.6cm length x 0.5cm width x 0.2cm depth; 0.047cm^3 volume. Character of Wound/Ulcer Post Debridement requires further debridement. Post procedure Diagnosis Wound #1: Same as Pre-Procedure Plan Wound Cleansing: Wound #1 Right,Medial Ankle: May Shower, gently pat wound dry prior to applying new dressing. Anesthetic: Wound #1 Right,Medial Ankle: Topical Lidocaine 4% cream applied to wound bed prior to debridement Primary Wound Dressing: Wound #1 Right,Medial Ankle: Aquacel Ag Secondary Dressing: Wound #1 Right,Medial Ankle: ABD pad Dressing Change Frequency: Wound #1 Right,Medial Ankle: Change dressing every week Follow-up Appointments: Wound #1 Right,Medial Ankle: Return Appointment in 1 week. Edema Control: Wound #1 Right,Medial Ankle: Kerlix and Coban - Left Lower Extremity Additional Orders / Instructions: Wound #1 Right,Medial Ankle: Increase protein intake. Activity as tolerated the patient remains very anxious and tense and is able to tolerate her compression. after review I have recommended: 1. Silver alginate and a light Kerlix and Coban dressing to be applied with mild compression to the right lower extremity 2. elevation and exercise 3. She will use 20-30 mm compression stockings on the left lower extremity 4. Arterial duplex study --pending 5. Venous reflux study-- study done results pending 6. Regular visits to the wound center Electronic Signature(s) Signed: 11/05/2016 8:56:31 AM By: Christin Fudge MD, FACS Auker, Felita T. ([id number]) Entered By: Christin Fudge on 11/05/2016 08:56:31 Dempster, Lulubelle TMarland Kitchen  ([id number]) -------------------------------------------------------------------------------- SuperBill Details Patient Name: [name], Kimi T. Date of Service: 11/05/2016 Medical Record Number: [id number] Patient Account Number: [id number] Date of Birth/Sex: 1940-03-17 (76 y.o. 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

## 2016-11-12 ENCOUNTER — Ambulatory Visit (INDEPENDENT_AMBULATORY_CARE_PROVIDER_SITE_OTHER): Payer: Medicare Other

## 2016-11-12 ENCOUNTER — Other Ambulatory Visit: Payer: Self-pay | Admitting: Surgery

## 2016-11-12 DIAGNOSIS — M7989 Other specified soft tissue disorders: Secondary | ICD-10-CM | POA: Diagnosis not present

## 2016-11-12 DIAGNOSIS — Z23 Encounter for immunization: Secondary | ICD-10-CM | POA: Diagnosis not present

## 2016-11-13 ENCOUNTER — Other Ambulatory Visit: Payer: Self-pay | Admitting: Family Medicine

## 2016-11-15 ENCOUNTER — Encounter: Payer: Medicare Other | Admitting: Surgery

## 2016-11-15 DIAGNOSIS — I83013 Varicose veins of right lower extremity with ulcer of ankle: Secondary | ICD-10-CM | POA: Diagnosis not present

## 2016-11-15 DIAGNOSIS — M319 Necrotizing vasculopathy, unspecified: Secondary | ICD-10-CM | POA: Diagnosis not present

## 2016-11-15 DIAGNOSIS — Z79899 Other long term (current) drug therapy: Secondary | ICD-10-CM | POA: Diagnosis not present

## 2016-11-15 DIAGNOSIS — I872 Venous insufficiency (chronic) (peripheral): Secondary | ICD-10-CM | POA: Diagnosis not present

## 2016-11-15 DIAGNOSIS — I10 Essential (primary) hypertension: Secondary | ICD-10-CM | POA: Diagnosis not present

## 2016-11-15 DIAGNOSIS — L97312 Non-pressure chronic ulcer of right ankle with fat layer exposed: Secondary | ICD-10-CM | POA: Diagnosis not present

## 2016-11-15 NOTE — Progress Notes (Addendum)
MALLOREY, [name] ([id number]) Visit Report for 11/15/2016 Chief Complaint Document Details Patient Name: [name], [name]. Date of Service: 11/15/2016 3:30 PM Medical Record Number: [id number] Patient Account Number: [id number] Date of Birth/Sex: [birth year] (76 y.o. Female) Treating RN: Cornell Barman Primary Care Provider: Elsie Stain Other Clinician: Referring Provider: Elsie Stain Treating Provider/Extender: Frann Rider in Treatment: 2 Information Obtained from: Patient Chief Complaint Patient presents for treatment of an open ulcer due to venous insufficiency Situated at her right medial ankle for about 3 weeks Electronic Signature(s) Signed: 11/15/2016 4:22:22 PM By: Christin Fudge MD, FACS Entered By: Christin Fudge on 11/15/2016 16:22:22 [name], [name]. ([id number]) -------------------------------------------------------------------------------- Debridement Details Patient Name: [name], [name]. Date of Service: 11/15/2016 3:30 PM Medical Record Number: [id number] Patient Account Number: [id number] Date of Birth/Sex: 06-20-1940 (76 y.o. Female) Treating RN: Montey Hora Primary Care Provider: Elsie Stain Other Clinician: Referring Provider: Elsie Stain Treating Provider/Extender: Frann Rider in Treatment: 2 Debridement Performed for Wound #1 Right,Medial Ankle Assessment: Performed By: Physician Christin Fudge, MD Debridement: Debridement Pre-procedure Verification/Time Yes - 16:15 Out Taken: Start Time: 16:16 Pain Control: Other : lidocaine 4% Level: Skin/Subcutaneous Tissue Total Area Debrided (L x W): 1 (cm) x 0.7 (cm) = 0.7 (cm) Tissue and other material Viable, Non-Viable, Fibrin/Slough, Subcutaneous debrided: Instrument: Curette Bleeding: Minimum Hemostasis Achieved: Pressure End Time: 16:20 Procedural Pain: 3 Post Procedural Pain: 3 Response to Treatment: Procedure was tolerated well Post Debridement Measurements of Total  Wound Length: (cm) 1 Width: (cm) 0.7 Depth: (cm) 0.2 Volume: (cm) 0.11 Character of Wound/Ulcer Post Debridement: Stable Post Procedure Diagnosis Same as Pre-procedure Electronic Signature(s) Signed: 11/15/2016 4:25:36 PM By: Christin Fudge MD, FACS Signed: 11/15/2016 6:04:08 PM By: Montey Hora Previous Signature: 11/15/2016 4:22:13 PM Version By: Christin Fudge MD, FACS Entered By: Montey Hora on 11/15/2016 16:22:18 [name], [name]. ([id number]) -------------------------------------------------------------------------------- HPI Details Patient Name: [name], [name]. Date of Service: 11/15/2016 3:30 PM Medical Record Number: [id number] Patient Account Number: [id number] Date of Birth/Sex: December 16, 1940 (76 y.o. Female) Treating RN: Cornell Barman Primary Care Provider: Elsie Stain Other Clinician: Referring Provider: Elsie Stain Treating Provider/Extender: Frann Rider in Treatment: 2 History of Present Illness Location: Patient presents with an ulcer on the right medial ankle. Quality: Patient reports experiencing a dull pain to affected area(s). Severity: Patient states wound are getting worse. Duration: Patient has had the wound for < 4 weeks prior to presenting for treatment Timing: Pain in wound is Intermittent (comes and goes Context: The wound would happen gradually Modifying Factors: Other treatment(s) tried include:local compression stockings recently Associated Signs and Symptoms: Patient reports having increase swelling. HPI Description: 76 year old patient was recently seen by her PCP [name] and he noted erythema and ulceration and started her on doxycycline. He noted a intact dorsalis pedis pulse.the medial right ankle had skin ulceration which had been treated by Keflex recently and notices made that she had had wound care treatments earlier. past medical history significant for back pain, endometriosis, high cholesterol, hypothyroidism,  migraine and venous stasis ulcer treated at [facility]. she has never been a smoker the patient gives a history of right varicose vein stripping done in the year 2000 at [facility]. She has not been wearing her compression since. 11/05/2016 -- the venous duplex study has been done for reflux and we are awaiting her arterial study to be done. The patient is of a very nervous disposition and seems to be very anxious. 11/15/2016 -- venous duplex reflux study -- venous incompetence  noted in the right saphenofemoral junction and the right great saphenous vein on the right lower extremity. The right great saphenous vein was harvested as mentioned above. Arterial study was done on 11/12/2016 but the results are not yet available. I will refer her to see the vascular surgeons for an opinion. Electronic Signature(s) Signed: 11/15/2016 4:22:40 PM By: Christin Fudge MD, FACS Previous Signature: 11/15/2016 3:43:16 PM Version By: Christin Fudge MD, FACS Entered By: Christin Fudge on 11/15/2016 16:22:40 [name], [name] ([id number]) -------------------------------------------------------------------------------- Physical Exam Details Patient Name: [name], [name]. Date of Service: 11/15/2016 3:30 PM Medical Record Number: [id number] Patient Account Number: [id number] Date of Birth/Sex: November 20, 1940 (76 y.o. Female) Treating RN: Cornell Barman Primary Care Provider: Elsie Stain Other Clinician: Referring Provider: Elsie Stain Treating Provider/Extender: Frann Rider in Treatment: 2 Constitutional . Pulse regular. Respirations normal and unlabored. Afebrile. . Eyes Nonicteric. Reactive to light. Ears, Nose, Mouth, and Throat Lips, teeth, and gums WNL.[name] Moist mucosa without lesions. Neck supple and nontender. No palpable supraclavicular or cervical adenopathy. Normal sized without goiter. Respiratory WNL. No retractions.. Cardiovascular Pedal Pulses WNL. No clubbing, cyanosis or  edema. Lymphatic No adneopathy. No adenopathy. No adenopathy. Musculoskeletal Adexa without tenderness or enlargement.. Digits and nails w/o clubbing, cyanosis, infection, petechiae, ischemia, or inflammatory conditions.. Integumentary (Hair, Skin) No suspicious lesions. No crepitus or fluctuance. No peri-wound warmth or erythema. No masses.[name] Psychiatric Judgement and insight Intact.. No evidence of depression, anxiety, or agitation.. Notes there was some thick eschar and subcutaneous debris which I sharply removed with a #3 curet and the wound is looking clean with minimal healthy granulation tissue. No bleeding Electronic Signature(s) Signed: 11/15/2016 4:23:31 PM By: Christin Fudge MD, FACS Entered By: Christin Fudge on 11/15/2016 16:23:30 [name], [name] ([id number]) -------------------------------------------------------------------------------- Physician Orders Details Patient Name: [name], [name]. Date of Service: 11/15/2016 3:30 PM Medical Record Number: [id number] Patient Account Number: [id number] Date of Birth/Sex: 1940/11/05 (76 y.o. Female) Treating RN: Primary Care Provider: Elsie Stain Other Clinician: Referring Provider: Elsie Stain Treating Provider/Extender: Melburn Hake, HOYT Weeks in Treatment: 2 Verbal / Phone Orders: No Diagnosis Coding Primary Wound Dressing Wound #1 Right,Medial Ankle o Silvercel Non-Adherent Follow-up Appointments Wound #1 Right,Medial Ankle o Return Appointment in 1 week. Edema Control Wound #1 Right,Medial Ankle o Kerlix and Coban - Right Lower Extremity - lightly wrapped from base of toes to 3 cm below knee. Electronic Signature(s) Signed: 11/15/2016 6:04:08 PM By: Montey Hora Signed: 11/17/2016 12:07:21 PM By: [name] PA-C Previous Signature: 11/12/2016 3:47:05 PM Version By: Gretta Cool BSN, RN, CWS, Kim RN, BSN Previous Signature: 11/12/2016 5:05:22 PM Version By: [name] PA-C Entered By: Montey Hora  on 11/15/2016 16:19:51 [name], [name] ([id number]) -------------------------------------------------------------------------------- Problem List Details Patient Name: Gittleman, Selam [name]. Date of Service: 11/15/2016 3:30 PM Medical Record Number: [id number] Patient Account Number: [id number] Date of Birth/Sex: 1940/07/29 (76 y.o. 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 ([id number]) -------------------------------------------------------------------------------- Progress Note Details Patient Name: [name], [name]. Date of Service: 11/15/2016 3:30 PM Medical Record Number: [id number] Patient Account Number: [id number]  Date of Birth/Sex: [birth year] (76 y.o. Female) Treating RN: Cornell Barman Primary Care Provider: Elsie Stain Other Clinician: Referring Provider: Elsie Stain Treating Provider/Extender: Frann Rider in Treatment: 2 Subjective Chief Complaint Information obtained from Patient Patient presents for treatment of an open ulcer due to venous insufficiency Situated at her right medial ankle for about 3 weeks History of Present Illness (HPI) The following HPI elements were documented for the patient's wound: Location: Patient presents with an ulcer on the right medial ankle. Quality: Patient reports experiencing a dull pain to affected area(s). Severity: Patient states wound are getting  worse. Duration: Patient has had the wound for < 4 weeks prior to presenting for treatment Timing: Pain in wound is Intermittent (comes and goes Context: The wound would happen gradually Modifying Factors: Other treatment(s) tried include:local compression stockings recently Associated Signs and Symptoms: Patient reports having increase swelling. 76 year old patient was recently seen by her PCP [name] and he noted erythema and ulceration and started her on doxycycline. He noted a intact dorsalis pedis pulse.the medial right ankle had skin ulceration which had been treated by Keflex recently and notices made that she had had wound care treatments earlier. past medical history significant for back pain, endometriosis, high cholesterol, hypothyroidism, migraine and venous stasis ulcer treated at [facility] she has never been a smoker the patient gives a history of right varicose vein stripping done in the year 2000 at [facility]. She has not been wearing her compression since. 11/05/2016 -- the venous duplex study has been done for reflux and we are awaiting her arterial study to be done. The patient is of a very nervous disposition and seems to be very anxious. 11/15/2016 -- venous duplex reflux study -- venous incompetence noted in the right saphenofemoral junction and the right great saphenous vein on the right lower extremity. The right great saphenous vein was harvested as mentioned above. Arterial study was done on 11/12/2016 but the results are not yet available. I will refer her to see the vascular surgeons for an opinion. Patient History Information obtained from Patient. Family History Cancer - Siblings, Heart Disease - Father, Stroke - Mother, No family history of Diabetes, Hypertension, Kidney Disease, Lung Disease, Seizures, Thyroid Problems, Tuberculosis. Social History Never smoker, Marital Status - Married, Alcohol Use - Never, Drug Use - No History, Caffeine Use -  Daily. 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 ([id number]) Objective Constitutional Pulse regular. Respirations normal and unlabored. Afebrile. Vitals Time Taken: 3:50 PM, Height: 68 in, Weight: 191.1 lbs, BMI: 29.1, Temperature: 98.3 F, Pulse: 63 bpm, Respiratory Rate: 16 breaths/min, Blood Pressure: 140/60 mmHg. Eyes Nonicteric. Reactive to light. Ears, Nose, Mouth, and Throat Lips, teeth, and gums WNL.[name] Moist mucosa without lesions. Neck supple and nontender. No palpable supraclavicular or cervical adenopathy. Normal sized without goiter. Respiratory WNL. No retractions.. Cardiovascular Pedal Pulses WNL. No clubbing, cyanosis or edema. Lymphatic No adneopathy. No adenopathy. No adenopathy. Musculoskeletal Adexa without tenderness or enlargement.. Digits and nails w/o clubbing, cyanosis, infection, petechiae, ischemia, or inflammatory conditions.[name] Psychiatric Judgement and insight Intact.. No evidence of depression, anxiety, or agitation.. General Notes: there was some thick eschar and subcutaneous debris which I sharply removed with a #3 curet and the wound is looking clean with minimal healthy granulation tissue. No bleeding Integumentary (Hair, Skin) No suspicious lesions. No crepitus or fluctuance. No peri-wound warmth or erythema. No masses.. Wound #1 status is Open. Original cause of wound was Gradually Appeared.  The wound is located on the Right,Medial Ankle. The wound measures 1cm length x 0.7cm width x 0.1cm depth; 0.55cm^2 area and 0.055cm^3 volume. There is Fat Layer (Subcutaneous Tissue) Exposed exposed. There is no tunneling or undermining noted. There is a medium amount of serosanguineous drainage noted. The wound margin is flat and intact. There is no granulation within the wound bed. There is a large (67-100%) amount of necrotic tissue within the wound bed including Adherent Slough. The periwound  skin appearance exhibited: Ecchymosis. The periwound skin appearance did not exhibit: Callus, Crepitus, Excoriation, Induration, Rash, Scarring, Dry/Scaly, Maceration, Atrophie Blanche, Cyanosis, Hemosiderin Staining, Mottled, Pallor, Rubor, Erythema. 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 There was a Skin/Subcutaneous Tissue Debridement ([id number]) debridement with total area of 0.7 sq cm performed by Christin Fudge, MD. with the following instrument(s): Curette to remove Viable and Non-Viable tissue/material including Fibrin/Slough and Subcutaneous after achieving pain control using Other (lidocaine 4%). A time out was conducted at 16:15, prior to the start of the procedure. A Minimum amount of bleeding was controlled with Pressure. The procedure was tolerated well with a pain level of 3 throughout and a pain level of 3 following the procedure. Post Debridement Measurements: 1cm length x 0.7cm width x 0.2cm depth; 0.11cm^3 volume. Character of Wound/Ulcer Post Debridement is stable. Post procedure Diagnosis Wound #1: Same as Pre-Procedure Plan Primary Wound Dressing: Wound #1 Right,Medial Ankle: Silvercel Non-Adherent Follow-up Appointments: Wound #1 Right,Medial Ankle: Return Appointment in 1 week. Edema Control: Wound #1 Right,Medial Ankle: Kerlix and Coban - Right Lower Extremity - lightly wrapped from base of toes to 3 cm below knee. after sharp debridement and review I have recommended: 1. Silver alginate and a light Kerlix and Coban dressing to be applied with mild compression to the right lower extremity 2. elevation and exercise 3. She will use 20-30 mm compression stockings on the left lower  extremity 4. Arterial duplex study --done and results pending 5. Venous reflux study-- results discussed with them and they will need a vascular surgeon's opinion [name], [name]. ([id number]) 6. Regular visits to the wound center Electronic Signature(s) Signed: 11/15/2016 4:24:31 PM By: Christin Fudge MD, FACS Entered By: Christin Fudge on 11/15/2016 16:24:30 Plotkin, [name] ([id number]) -------------------------------------------------------------------------------- ROS/PFSH Details Patient Name: [name], Chenay [name]. Date of Service: 11/15/2016 3:30 PM Medical Record Number: [id number] Patient Account Number: [id number] Date of Birth/Sex: May 10, 1940 (76 y.o. Female) Treating RN: Cornell Barman Primary Care Provider: Elsie Stain Other Clinician: Referring Provider: Elsie Stain Treating Provider/Extender: Frann Rider in Treatment: 2 Information Obtained From Patient Wound History Do you currently have one or more open woundso Yes How many open wounds do you currently haveo 1 Approximately how long have you had your woundso 3 weeks How have you been treating your wound(s) until nowo triple abx ointment Has your wound(s) ever healed and then re-openedo No Have you had any lab work done in the past montho No Have you tested positive for an antibiotic resistant organism (MRSA, VRE)o No Have you tested positive for osteomyelitis (bone infection)o No Have you had any tests for circulation on your legso Yes Eyes Medical History: Negative for: Cataracts; Glaucoma; Optic Neuritis Ear/Nose/Mouth/Throat Medical History: Negative for: Chronic sinus problems/congestion; Middle ear problems Hematologic/Lymphatic Medical History: Negative  for: Anemia; Hemophilia; Human Immunodeficiency Virus; Lymphedema; Sickle Cell Disease Respiratory Medical History: Negative for: Aspiration; Asthma; Chronic Obstructive Pulmonary Disease (COPD); Sleep Apnea; Tuberculosis Cardiovascular Medical  History: Positive for: Hypertension - medication controlled Negative for: Angina; Arrhythmia; Congestive Heart Failure; Coronary Artery Disease; Deep Vein Thrombosis; Hypotension; Myocardial Infarction; Peripheral Arterial Disease; Peripheral Venous Disease; Phlebitis; Vasculitis Past Medical History Notes: 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 ([id number]) Medical History: Negative for: Type I Diabetes; Type II Diabetes Genitourinary Medical History: Negative for: End Stage Renal Disease Immunological Medical History: Negative for: Lupus Erythematosus; Raynaudos Integumentary (Skin) Medical History: Negative for: History of Burn; History of pressure wounds Musculoskeletal Medical History: Negative for: Gout; Rheumatoid Arthritis; Osteoarthritis; Osteomyelitis Neurologic Medical History: Negative for: Dementia; Neuropathy; Quadriplegia; Paraplegia; Seizure Disorder Oncologic Medical History: Negative for: Received Chemotherapy; Received Radiation Psychiatric Medical History: Negative for: Anorexia/bulimia; Confinement Anxiety Immunizations Pneumococcal Vaccine: Received Pneumococcal Vaccination: Yes Implantable Devices Family and Social History Cancer: Yes - Siblings; Diabetes: No; Heart Disease: Yes - Father; Hypertension: No; Kidney Disease: No; Lung Disease: No; Seizures: No; Stroke: Yes - Mother; Thyroid Problems: No; Tuberculosis: No; Never smoker; Marital Status - Married; Alcohol Use: Never; Drug Use: No History; Caffeine Use: Daily; Advanced Directives: No; Patient does not want information on Advanced Directives; Do not resuscitate: No; Living Will: No; Medical Power of Attorney: No Physician Affirmation I have reviewed and agree with the above information. Electronic Signature(s) Signed: 11/15/2016 4:25:36 PM By: Christin Fudge MD, FACS Signed: 11/15/2016 5:39:55 PM By: Gretta Cool BSN, RN, CWS, Kim RN, BSN Entered By: Christin Fudge on 11/15/2016 16:22:50 TANISHKA, DROLET ([id number]) GREGORIA, SELVY ([id number]) -------------------------------------------------------------------------------- SuperBill Details Patient Name: Sibert, Tylisa [name]. Date of Service: 11/15/2016 Medical Record Number: [id number] Patient Account Number: [id number] Date of Birth/Sex: 02/22/1940 (76 y.o. 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 Modifier: posed Quantity: 1 Physician Procedures CPT4 Code: [id number] Description: 03474 - WC PHYS LEVEL 3 - EST PT ICD-10 Diagnosis Description Q59.563 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

## 2016-11-15 NOTE — Telephone Encounter (Signed)
1 yr was faxed to [place] asked pharm to plz transfer/thx dmf

## 2016-11-16 NOTE — Progress Notes (Signed)
TAMEKA, HOILAND ([id number]) Visit Report for 11/15/2016 Arrival Information Details Patient Name: [name], [name]. Date of Service: 11/15/2016 3:30 PM Medical Record Number: [id number] Patient Account Number: [id number] Date of Birth/Sex: 12/27/1940 (77 y.o. Female) Treating RN: Montey Hora Primary Care Katrese Shell: Elsie Stain Other Clinician: Referring Derrica Sieg: Elsie Stain Treating Lise Pincus/Extender: Frann Rider in Treatment: 2 Visit Information History Since Last Visit Added or deleted any medications: No Patient Arrived: Ambulatory Any new allergies or adverse reactions: No Arrival Time: 15:43 Had a fall or experienced change in No Accompanied By: spouse activities of daily living that may affect Transfer Assistance: None risk of falls: Patient Identification Verified: Yes Signs or symptoms of abuse/neglect since last visito No Secondary Verification Process Yes Hospitalized since last visit: No Completed: Has Dressing in Place as Prescribed: Yes Patient Has Alerts: Yes Has Compression in Place as Prescribed: Yes Patient Alerts: Patient on Blood Pain Present Now: No Thinner 81 MG Aspirin NOT DiaBETIC Electronic Signature(s) Signed: 11/15/2016 6:04:08 PM By: Montey Hora Entered By: Montey Hora on 11/15/2016 15:45:03 Pawelski, Raychel T. ([id number]) -------------------------------------------------------------------------------- Encounter Discharge Information Details Patient Name: [name], [name] Date of Service: 11/15/2016 3:30 PM Medical Record Number: [id number] Patient Account Number: [id number] Date of Birth/Sex: May 05, 1940 (76 y.o. Female) Treating RN: Cornell Barman Primary Care Shital Crayton: Elsie Stain Other Clinician: Referring Nyrah Demos: Elsie Stain Treating Shanessa Hodak/Extender: Frann Rider in Treatment: 2 Encounter Discharge Information Items Discharge Pain Level: 0 Discharge Condition: Stable Ambulatory Status:  Ambulatory Discharge Destination: Home Transportation: Private Auto Accompanied By: husband Schedule Follow-up Appointment: Yes Medication Reconciliation completed and Yes provided to Patient/Care Chanah Tidmore: Provided on Clinical Summary of Care: 11/15/2016 Form Type Recipient Paper Patient [facility] Electronic Signature(s) Signed: 11/15/2016 6:04:08 PM By: Montey Hora Entered By: Montey Hora on 11/15/2016 16:24:06 [name], [name] ([id number]) -------------------------------------------------------------------------------- Lower Extremity Assessment Details Patient Name: [name], [name] Date of Service: 11/15/2016 3:30 PM Medical Record Number: [id number] Patient Account Number: [id number] Date of Birth/Sex: Sep 11, 1940 (76 y.o. Female) Treating RN: Montey Hora Primary Care Lyal Husted: Elsie Stain Other Clinician: Referring Norris Bodley: Elsie Stain Treating Meaghen Vecchiarelli/Extender: Frann Rider in Treatment: 2 Vascular Assessment Pulses: Dorsalis Pedis Palpable: [Right:Yes] Posterior Tibial Extremity colors, hair growth, and conditions: Extremity Color: [Right:Mottled] Hair Growth on Extremity: [Right:No] Temperature of Extremity: [Right:Warm] Capillary Refill: [Right:< 3 seconds] Electronic Signature(s) Signed: 11/15/2016 6:04:08 PM By: Montey Hora Entered By: Montey Hora on 11/15/2016 15:55:06 [name], [name] ([id number]) -------------------------------------------------------------------------------- Multi Wound Chart Details Patient Name: [name], [name] Date of Service: 11/15/2016 3:30 PM Medical Record Number: [id number] Patient Account Number: [id number] Date of Birth/Sex: 03/05/1940 (76 y.o. 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] [N/A:N/A] Comorbid History: [1:Hypertension] [N/A:N/A] Date Acquired: [1:10/15/2016] [N/A:N/A] Weeks of Treatment: [1:2] [N/A:N/A] Wound Status: [1:Open] [N/A:N/A] Measurements L x W x D [1:1x0.7x0.1] [N/A:N/A] (cm) Area (cm) : [1:0.55] [N/A:N/A] Volume (cm) : [1:0.055] [N/A:N/A] % Reduction in Area: [1:36.30%] [N/A:N/A] % Reduction in Volume: [1:36.00%] [N/A:N/A] Classification: [1:Partial Thickness] [N/A:N/A] Exudate Amount: [1:Medium] [N/A:N/A] Exudate Type: [1:Serosanguineous] [N/A:N/A] Exudate Color: [1:red, brown] [N/A:N/A] Wound Margin: [1:Flat and Intact] [N/A:N/A] Granulation Amount: [1:None Present (0%)] [N/A:N/A] Necrotic Amount: [1:Large (67-100%)] [N/A:N/A] Exposed Structures: [1:Fat Layer (Subcutaneous Tissue) Exposed:  Yes Fascia: No Tendon: No Muscle: No Joint: No Bone: No] [N/A:N/A] Epithelialization: [1:None] [N/A:N/A] Periwound Skin Texture: [1:Excoriation: No Induration: No Callus: No Crepitus: No Rash: No Scarring: No] [N/A:N/A] Periwound Skin Moisture: [1:Maceration: No Dry/Scaly: No] [N/A:N/A] Periwound Skin Color: Ecchymosis: Yes N/A N/A Atrophie Blanche: No Cyanosis: No Erythema: No Hemosiderin Staining: No Mottled: No Pallor: No Rubor: No Tenderness on Palpation: No N/A N/A Wound Preparation: Ulcer Cleansing: N/A N/A Rinsed/Irrigated with Saline Topical Anesthetic Applied: Other: lidocaine 4% Treatment Notes Electronic Signature(s) Signed: 11/15/2016 4:20:51 PM By: Christin Fudge MD, FACS Entered By: Christin Fudge on 11/15/2016 16:20:50 [name], [name]  ([id number]) -------------------------------------------------------------------------------- [place] Details Patient Name: [name], [name] Date of Service: 11/15/2016 3:30 PM Medical Record Number: [id number] Patient Account Number: [id number] Date of Birth/Sex: 1941/01/16 (76 y.o. Female) Treating RN: Montey Hora Primary Care Anjanae Woehrle: Elsie Stain Other Clinician: Referring Tauri Ethington: Elsie Stain Treating Marvella Jenning/Extender: Frann Rider in Treatment: 2 Active Inactive ` Abuse / Safety / Falls / Self Care Management Nursing Diagnoses: Potential for falls Goals: Patient will not experience any injury related to falls Date Initiated: 10/29/2016 Target Resolution Date: 11/15/2016 Goal Status: Active Interventions: Podiatry chair, stretcher in low position and side rails up as needed Notes: ` Orientation to the Wound Care Program Nursing Diagnoses: Knowledge deficit related to the wound healing center program Goals: Patient/caregiver will verbalize understanding of the [facility] Date Initiated: 10/29/2016 Target Resolution Date: 11/15/2016 Goal Status: Active Interventions: Provide education on orientation to the wound center Notes: ` Venous Leg Ulcer Nursing Diagnoses: Potential for venous Insuffiency (use before diagnosis confirmed) Goals: Patient will maintain optimal edema control Date Initiated: 10/29/2016 Target Resolution Date: 11/15/2016 Goal Status: Active Interventions: Knick, Ramonia T. ([id number]) Assess peripheral edema status every visit. Treatment Activities: Non-invasive vascular studies : 10/29/2016 Notes: ` Wound/Skin Impairment Nursing Diagnoses: Impaired tissue integrity Goals: Ulcer/skin breakdown will heal within 14 weeks Date Initiated: 10/29/2016 Target Resolution Date: 02/14/2017 Goal Status: Active Interventions: Assess ulceration(s) every visit Treatment Activities: Referred  to DME Ahmarion Saraceno for dressing supplies : 10/29/2016 Notes: Electronic Signature(s) Signed: 11/15/2016 6:04:08 PM By: Montey Hora Entered By: Montey Hora on 11/15/2016 16:09:26 [name], [name] ([id number]) -------------------------------------------------------------------------------- Pain Assessment Details Patient Name: [name], [name] Date of Service: 11/15/2016 3:30 PM Medical Record Number: [id number] Patient Account Number: [id number] Date of Birth/Sex: October 30, 1940 (76 y.o. Female) Treating RN: Montey Hora Primary Care Jamaya Sleeth: Elsie Stain Other Clinician: Referring Charlisha Market: Elsie Stain Treating Jacey Eckerson/Extender: Frann Rider in Treatment: 2 Active Problems Location of Pain Severity and Description of Pain Patient Has Paino Yes Site Locations Pain Location: Pain in Ulcers With Dressing Change: Yes Duration of the Pain. Constant / Intermittento Intermittent Pain Management and Medication Current Pain Management: Electronic Signature(s) Signed: 11/15/2016 6:04:08 PM By: Montey Hora Entered By: Montey Hora on 11/15/2016 15:45:17 [name], [name] ([id number]) -------------------------------------------------------------------------------- Patient/Caregiver Education Details Patient Name: [name], Allaya T. Date of Service: 11/15/2016 3:30 PM Medical Record Number: [id number] Patient Account Number: [id number] Date of Birth/Gender: [birth year] (76 y.o. Female) Treating RN: Montey Hora Primary Care Physician: Elsie Stain Other Clinician: Referring Physician: Elsie Stain Treating Physician/Extender: Frann Rider in Treatment: 2 Education Assessment Education Provided To: Patient Education Topics Provided Wound/Skin Impairment: Handouts: Caring for Your Ulcer, Other: wound care as prescribed Methods: Demonstration, Explain/Verbal Responses: State content correctly Electronic Signature(s) Signed: 11/15/2016 6:04:08 PM By:  Montey Hora Entered By: Montey Hora on 11/15/2016 16:24:22 [name], [name] ([id number]) -------------------------------------------------------------------------------- Wound Assessment Details Patient Name: [name], [name]  Date of Service: 11/15/2016 3:30 PM Medical Record Number: [id number] Patient Account Number: [id number] Date of Birth/Sex: 1940/07/22 (76 y.o. Female) Treating RN: Montey Hora Primary Care Kalman Nylen: Elsie Stain Other Clinician: Referring Reathel Turi: Elsie Stain Treating Herminia Warren/Extender: Frann Rider in Treatment: 2 Wound Status Wound Number: 1 Primary Etiology: Vasculopathy Wound Location: Right Ankle - Medial Wound Status: Open Wounding Event: Gradually Appeared Comorbid History: Hypertension Date Acquired: 10/15/2016 Weeks Of Treatment: 2 Clustered Wound: No Wound Measurements Length: (cm) 1 Width: (cm) 0.7 Depth: (cm) 0.1 Area: (cm) 0.55 Volume: (cm) 0.055 % Reduction in Area: 36.3% % Reduction in Volume: 36% Epithelialization: None Tunneling: No Undermining: No Wound Description Classification: Partial Thickness Wound Margin: Flat and Intact Exudate Amount: Medium Exudate Type: Serosanguineous Exudate Color: red, brown Foul Odor After Cleansing: No Slough/Fibrino Yes Wound Bed Granulation Amount: None Present (0%) Exposed Structure Necrotic Amount: Large (67-100%) Fascia Exposed: No Necrotic Quality: Adherent Slough Fat Layer (Subcutaneous Tissue) Exposed: Yes Tendon Exposed: No Muscle Exposed: No Joint Exposed: No Bone Exposed: No Periwound Skin Texture Texture Color No Abnormalities Noted: No No Abnormalities Noted: No Callus: No Atrophie Blanche: No Crepitus: No Cyanosis: No Excoriation: No Ecchymosis: Yes Induration: No Erythema: No Rash: No Hemosiderin Staining: No Scarring: No Mottled: No Pallor: No Moisture Rubor: No No Abnormalities Noted: No Dry / Scaly: No Maceration: No [name],  [name] ([id number]) Wound Preparation Ulcer Cleansing: Rinsed/Irrigated with Saline Topical Anesthetic Applied: Other: lidocaine 4%, Treatment Notes Wound #1 (Right, Medial Ankle) 1. Cleansed with: Clean wound with Normal Saline 2. Anesthetic Topical Lidocaine 4% cream to wound bed prior to debridement 4. Dressing Applied: Other dressing (specify in notes) 7. Secured with Tape Notes [employer], kerlix and Event organiser) Signed: 11/15/2016 6:04:08 PM By: Montey Hora Entered By: Montey Hora on 11/15/2016 15:54:46 [name], [name] ([id number]) -------------------------------------------------------------------------------- Vitals Details Patient Name: [name], Anetra T. Date of Service: 11/15/2016 3:30 PM Medical Record Number: [id number] Patient Account Number: [id number] Date of Birth/Sex: Aug 02, 1940 (76 y.o. Female) Treating RN: Montey Hora Primary Care Clemma Johnsen: Elsie Stain Other Clinician: Referring Sakara Lehtinen: Elsie Stain Treating Lacye Mccarn/Extender: Frann Rider in Treatment: 2 Vital Signs Time Taken: 15:50 Temperature (F): 98.3 Height (in): 68 Pulse (bpm): 63 Weight (lbs): 191.1 Respiratory Rate (breaths/min): 16 Body Mass Index (BMI): 29.1 Blood Pressure (mmHg): 140/60 Reference Range: 80 - 120 mg / dl Electronic Signature(s) Signed: 11/15/2016 6:04:08 PM By: Montey Hora Entered By: Montey Hora on 11/15/2016 15:53:17

## 2016-11-22 ENCOUNTER — Encounter: Payer: Medicare Other | Attending: Surgery | Admitting: Surgery

## 2016-11-22 DIAGNOSIS — I10 Essential (primary) hypertension: Secondary | ICD-10-CM | POA: Insufficient documentation

## 2016-11-22 DIAGNOSIS — I872 Venous insufficiency (chronic) (peripheral): Secondary | ICD-10-CM | POA: Diagnosis not present

## 2016-11-22 DIAGNOSIS — I83013 Varicose veins of right lower extremity with ulcer of ankle: Secondary | ICD-10-CM | POA: Insufficient documentation

## 2016-11-22 DIAGNOSIS — L97312 Non-pressure chronic ulcer of right ankle with fat layer exposed: Secondary | ICD-10-CM | POA: Diagnosis not present

## 2016-11-22 NOTE — Progress Notes (Addendum)
EUPHA, LOBB ([id number]) Visit Report for 11/22/2016 Chief Complaint Document Details Patient Name: [name], [name]. Date of Service: 11/22/2016 3:30 PM Medical Record Number: [id number] Patient Account Number: [id number] Date of Birth/Sex: [birth year] (76 y.o. Female) Treating RN: Montey Hora Primary Care Provider: Elsie Stain Other Clinician: Referring Provider: Elsie Stain Treating Provider/Extender: Frann Rider in Treatment: 3 Information Obtained from: Patient Chief Complaint Patient presents for treatment of an open ulcer due to venous insufficiency Situated at her right medial ankle for about 3 weeks Electronic Signature(s) Signed: 11/22/2016 4:17:10 PM By: Christin Fudge MD, FACS Entered By: Christin Fudge on 11/22/2016 16:17:09 [name]. ([id number]) -------------------------------------------------------------------------------- Debridement Details Patient Name: [name], [name]. Date of Service: 11/22/2016 3:30 PM Medical Record Number: [id number] Patient Account Number: [id number] Date of Birth/Sex: 03-Dec-1940 (76 y.o. Female) Treating RN: Montey Hora Primary Care Provider: Elsie Stain Other Clinician: Referring Provider: Elsie Stain Treating Provider/Extender: Frann Rider in Treatment: 3 Debridement Performed for Wound #1 Right,Medial Ankle Assessment: Performed By: Physician Christin Fudge, MD Debridement: Debridement Pre-procedure Verification/Time Yes - 15:53 Out Taken: Start Time: 15:53 Pain Control: Lidocaine 4% Topical Solution Level: Skin/Subcutaneous Tissue Total Area Debrided (L x W): 0.8 (cm) x 0.6 (cm) = 0.48 (cm) Tissue and other material Viable, Non-Viable, Eschar, Fibrin/Slough, Subcutaneous debrided: Instrument: Forceps Bleeding: Minimum Hemostasis Achieved: Pressure End Time: 15:57 Procedural Pain: 6 Post Procedural Pain: 0 Response to Treatment: Procedure was tolerated well Post Debridement  Measurements of Total Wound Length: (cm) 0.8 Width: (cm) 0.6 Depth: (cm) 0.2 Volume: (cm) 0.075 Character of Wound/Ulcer Post Debridement: Improved Post Procedure Diagnosis Same as Pre-procedure Electronic Signature(s) Signed: 11/22/2016 4:17:02 PM By: Christin Fudge MD, FACS Signed: 11/22/2016 5:07:14 PM By: Montey Hora Entered By: Christin Fudge on 11/22/2016 16:17:02 [name], [name]. ([id number]) -------------------------------------------------------------------------------- HPI Details Patient Name: [name], [name]. Date of Service: 11/22/2016 3:30 PM Medical Record Number: [id number] Patient Account Number: [id number] Date of Birth/Sex: Dec 20, 1940 (76 y.o. Female) Treating RN: Montey Hora Primary Care Provider: Elsie Stain Other Clinician: Referring Provider: Elsie Stain Treating Provider/Extender: Frann Rider in Treatment: 3 History of Present Illness Location: Patient presents with an ulcer on the right medial ankle. Quality: Patient reports experiencing a dull pain to affected area(s). Severity: Patient states wound are getting worse. Duration: Patient has had the wound for < 4 weeks prior to presenting for treatment Timing: Pain in wound is Intermittent (comes and goes Context: The wound would happen gradually Modifying Factors: Other treatment(s) tried include:local compression stockings recently Associated Signs and Symptoms: Patient reports having increase swelling. HPI Description: 76 year old patient was recently seen by her PCP [name] and he noted erythema and ulceration and started her on doxycycline. He noted a intact dorsalis pedis pulse.the medial right ankle had skin ulceration which had been treated by Keflex recently and notices made that she had had wound care treatments earlier. past medical history significant for back pain, endometriosis, high cholesterol, hypothyroidism, migraine and venous stasis ulcer treated at [facility]. she has never been a smoker the patient gives a history of right varicose vein stripping done in the year 2000 at [facility]. She has not been wearing her compression since. 11/05/2016 -- the venous duplex study has been done for reflux and we are awaiting her arterial study to be done. The patient is of a very nervous disposition and seems to be very anxious. 11/15/2016 -- venous duplex reflux study -- venous incompetence noted in the right saphenofemoral junction and the right great  saphenous vein on the right lower extremity. The right great saphenous vein was harvested as mentioned above. Arterial study was done on 11/12/2016 but the results are not yet available. I will refer her to see the vascular surgeons for an opinion. 11/22/2016 -- lower extremity arterial duplex examination done showed no hemodynamically significant stenosis in bilateral arterial flow examination. As far as a venous examination goes she had declined to get her left leg studied and the right leg is also noted on 11/15/2016. On asking the patient about this she says she never told the technician not to do her left leg but it may have been a misunderstanding. The patient will be seeing the vascular surgeons soon and I have asked them to address whether the left leg needs a venous duplex study Electronic Signature(s) Signed: 11/22/2016 4:18:03 PM By: Christin Fudge MD, FACS Previous Signature: 11/22/2016 3:27:01 PM Version By: Christin Fudge MD, FACS Entered By: Christin Fudge on 11/22/2016 16:18:02 [name], [name] ([id number]) -------------------------------------------------------------------------------- Physical Exam Details Patient Name: [name], [name]. Date of Service: 11/22/2016 3:30 PM Medical Record Number: [id number] Patient Account Number: [id number] Date of Birth/Sex: 1940-05-22 (76 y.o. Female) Treating RN: Montey Hora Primary Care Provider: Elsie Stain Other Clinician: Referring Provider:  Elsie Stain Treating Provider/Extender: Frann Rider in Treatment: 3 Constitutional . Pulse regular. Respirations normal and unlabored. Afebrile. . Eyes Nonicteric. Reactive to light. Ears, Nose, Mouth, and Throat Lips, teeth, and gums WNL.[name] Moist mucosa without lesions. Neck supple and nontender. No palpable supraclavicular or cervical adenopathy. Normal sized without goiter. Respiratory WNL. No retractions.. Cardiovascular Pedal Pulses WNL. No clubbing, cyanosis or edema. Lymphatic No adneopathy. No adenopathy. No adenopathy. Musculoskeletal Adexa without tenderness or enlargement.. Digits and nails w/o clubbing, cyanosis, infection, petechiae, ischemia, or inflammatory conditions.. Integumentary (Hair, Skin) No suspicious lesions. No crepitus or fluctuance. No peri-wound warmth or erythema. No masses.[name] Psychiatric Judgement and insight Intact.. No evidence of depression, anxiety, or agitation.. Notes there was again some thick eschar which needed to be sharply removed with a toothed forcep and once this was done there was good resolution on the size of the wound and a minimal area open with healthy granulation tissue. Electronic Signature(s) Signed: 11/22/2016 4:18:50 PM By: Christin Fudge MD, FACS Entered By: Christin Fudge on 11/22/2016 16:18:49 [name] ([id number]) -------------------------------------------------------------------------------- Physician Orders Details Patient Name: [name], [name]. Date of Service: 11/22/2016 3:30 PM Medical Record Number: [id number] Patient Account Number: [id number] Date of Birth/Sex: [birth year] (76 y.o. Female) Treating RN: Montey Hora Primary Care Provider: Elsie Stain Other Clinician: Referring Provider: Elsie Stain Treating Provider/Extender: Frann Rider in Treatment: 3 Verbal / Phone Orders: No Diagnosis Coding Wound Cleansing Wound #1 Right,Medial Ankle o Clean wound with Normal  Saline. o Cleanse wound with mild soap and water Anesthetic Wound #1 Right,Medial Ankle o Topical Lidocaine 4% cream applied to wound bed prior to debridement Primary Wound Dressing Wound #1 Right,Medial Ankle o Silvercel Non-Adherent Secondary Dressing Wound #1 Right,Medial Ankle o ABD pad Dressing Change Frequency Wound #1 Right,Medial Ankle o Change dressing every week Follow-up Appointments Wound #1 Right,Medial Ankle o Return Appointment in 1 week. Edema Control Wound #1 Right,Medial Ankle o 3 Layer Compression System - Right Lower Extremity Electronic Signature(s) Signed: 11/22/2016 4:32:35 PM By: Christin Fudge MD, FACS Signed: 11/22/2016 5:07:14 PM By: Montey Hora Entered By: Montey Hora on 11/22/2016 16:00:08 [name], [name]. ([id number]) -------------------------------------------------------------------------------- Problem List Details Patient Name: [name], [name]. Date of Service: 11/22/2016 3:30 PM Medical Record Number: [id number]  Patient Account Number: [id number] Date of Birth/Sex: 1940/03/20 (76 y.o. 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 ([id number]) -------------------------------------------------------------------------------- Progress Note Details Patient Name: [name], [name]. Date of Service: 11/22/2016 3:30 PM Medical Record Number: [id number] Patient  Account Number: [id number] Date of Birth/Sex: 02-Jul-1940 (76 y.o. Female) Treating RN: Montey Hora Primary Care Provider: Elsie Stain Other Clinician: Referring Provider: Elsie Stain Treating Provider/Extender: Frann Rider in Treatment: 3 Subjective Chief Complaint Information obtained from Patient Patient presents for treatment of an open ulcer due to venous insufficiency Situated at her right medial ankle for about 3 weeks History of Present Illness (HPI) The following HPI elements were documented for the patient's wound: Location: Patient presents with an ulcer on the right medial ankle. Quality: Patient reports experiencing a dull pain to affected area(s). Severity: Patient states wound are getting worse. Duration: Patient has had the wound for < 4 weeks prior to presenting for treatment Timing: Pain in wound is Intermittent (comes and goes Context: The wound would happen gradually Modifying Factors: Other treatment(s) tried include:local compression stockings recently Associated Signs and Symptoms: Patient reports having increase swelling. 76 year old patient was recently seen by her PCP [name] and he noted erythema and ulceration and started her on doxycycline. He noted a intact dorsalis pedis pulse.the medial right ankle had skin ulceration which had been treated by Keflex recently and notices made that she had had wound care treatments earlier. past medical history significant for back pain, endometriosis, high cholesterol, hypothyroidism, migraine and venous stasis ulcer treated at [facility]. she has never been a smoker the patient gives a history of right varicose vein stripping done in the year 2000 at [facility]. She has not been wearing her compression since. 11/05/2016 -- the venous duplex study has been done for reflux and we are awaiting her arterial study to be done. The patient is of a very nervous disposition and seems to be very  anxious. 11/15/2016 -- venous duplex reflux study -- venous incompetence noted in the right saphenofemoral junction and the right great saphenous vein on the right lower extremity. The right great saphenous vein was harvested as mentioned above. Arterial study was done on 11/12/2016 but the results are not yet available. I will refer her to see the vascular surgeons for an opinion. 11/22/2016 -- lower extremity arterial duplex examination done showed no hemodynamically significant stenosis in bilateral arterial flow examination. As far as a venous examination goes she had declined to get her left leg studied and the right leg is also noted on 11/15/2016. On asking the patient about this she says she never told the technician not to do her left leg but it may have been a misunderstanding. The patient will be seeing the vascular surgeons soon and I have asked them to address whether the left leg needs a venous duplex study Patient History Information obtained from Patient. Family History Cancer - Siblings, Heart Disease - Father, Stroke - Mother, [name], LOCKYER. ([id number]) No family history of Diabetes, Hypertension, Kidney Disease, Lung Disease, Seizures, Thyroid Problems,  Tuberculosis. Social History Never smoker, Marital Status - Married, Alcohol Use - Never, Drug Use - No History, Caffeine Use - Daily. 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 Respirations normal and unlabored. Afebrile. Vitals Time Taken: 3:39 PM, Height: 68 in, Weight: 191.1 lbs, BMI: 29.1, Temperature: 98.5 F, Pulse: 66 bpm, Respiratory Rate: 18 breaths/min, Blood Pressure: 126/61 mmHg. Eyes Nonicteric. Reactive to light. Ears, Nose, Mouth, and Throat Lips, teeth, and gums WNL.[name] Moist mucosa without lesions. Neck supple and nontender. No palpable supraclavicular or cervical adenopathy. Normal sized without  goiter. Respiratory WNL. No retractions.. Cardiovascular Pedal Pulses WNL. No clubbing, cyanosis or edema. Lymphatic No adneopathy. No adenopathy. No adenopathy. Musculoskeletal Adexa without tenderness or enlargement.. Digits and nails w/o clubbing, cyanosis, infection, petechiae, ischemia, or inflammatory conditions.[name] Psychiatric Judgement and insight Intact.. No evidence of depression, anxiety, or agitation.. General Notes: there was again some thick eschar which needed to be sharply removed with a toothed forcep and once this was done there was good resolution on the size of the wound and a minimal area open with healthy granulation tissue. Integumentary (Hair, Skin) No suspicious lesions. No crepitus or fluctuance. No peri-wound warmth or erythema. No masses.[name], Hadlee [name] Kitchen ([id number]) Wound #1 status is Open. Original cause of wound was Gradually Appeared. The wound is located on the Right,Medial Ankle. The wound measures 0.8cm length x 0.6cm width x 0.1cm depth; 0.377cm^2 area and 0.038cm^3 volume. There is Fat Layer (Subcutaneous Tissue) Exposed exposed. There is no tunneling or undermining noted. There is a medium amount of serosanguineous drainage noted. The wound margin is flat and intact. There is no granulation within the wound bed. There is a large (67-100%) amount of necrotic tissue within the wound bed including Adherent Slough. The periwound skin appearance exhibited: Ecchymosis. The periwound skin appearance did not exhibit: Callus, Crepitus, Excoriation, Induration, Rash, Scarring, Dry/Scaly, Maceration, Atrophie Blanche, Cyanosis, Hemosiderin Staining, Mottled, Pallor, Rubor, Erythema. 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 There was a Skin/Subcutaneous Tissue Debridement ([id number]) debridement with total area of 0.48 sq cm performed by Christin Fudge, MD. with the following instrument(s): Forceps to remove Viable and Non-Viable tissue/material including Fibrin/Slough, Eschar, and Subcutaneous after achieving pain control using Lidocaine 4% Topical Solution. A time out was conducted at 15:53, prior to the start of the procedure. A Minimum amount of bleeding was controlled with Pressure. The procedure was tolerated well with a pain level of 6 throughout and a pain level of 0 following the procedure. Post Debridement Measurements: 0.8cm length x 0.6cm width x 0.2cm depth; 0.075cm^3 volume. Character of Wound/Ulcer Post Debridement is improved. Post procedure Diagnosis Wound #1: Same as Pre-Procedure Plan Wound Cleansing: Wound #1 Right,Medial Ankle: Clean wound with Normal Saline. Cleanse wound with mild soap and water Anesthetic: Wound #1 Right,Medial Ankle: Topical Lidocaine 4% cream applied to wound bed prior to debridement Primary Wound Dressing: Wound #1 Right,Medial Ankle: Silvercel Non-Adherent Secondary Dressing: Wound #1 Right,Medial Ankle: ABD pad Besson, Nanako [name]. ([id number]) Dressing Change Frequency: Wound #1 Right,Medial Ankle: Change dressing every week Follow-up Appointments: Wound #1 Right,Medial Ankle: Return Appointment in 1 week. Edema Control: Wound #1 Right,Medial Ankle: 3 Layer Compression System - Right Lower Extremity I have reviewed both the arterial and venous  duplex results with the patient and husband and after sharp debridement today I have recommended: 1. Silver alginate and a 3 layer compression to be applied to the right lower extremity 2. elevation and exercise 3. She will use 20-30 mm compression stockings on the left lower extremity 4. Regular visits to the wound center Electronic Signature(s) Signed: 11/22/2016 4:20:12 PM By: Christin Fudge MD,  FACS Entered By: Christin Fudge on 11/22/2016 16:20:12 Windmiller, Caren Macadam ([id number]) -------------------------------------------------------------------------------- ROS/PFSH Details Patient Name: [name], Estee [name]. Date of Service: 11/22/2016 3:30 PM Medical Record Number: [id number] Patient Account Number: [id number] Date of Birth/Sex: 1940/06/29 (76 y.o. Female) Treating RN: Montey Hora Primary Care Provider: Elsie Stain Other Clinician: Referring Provider: Elsie Stain Treating Provider/Extender: Frann Rider in Treatment: 3 Information Obtained From Patient Wound History Do you currently have one or more open woundso Yes How many open wounds do you currently haveo 1 Approximately how long have you had your woundso 3 weeks How have you been treating your wound(s) until nowo triple abx ointment Has your wound(s) ever healed and then re-openedo No Have you had any lab work done in the past montho No Have you tested positive for an antibiotic resistant organism (MRSA, VRE)o No Have you tested positive for osteomyelitis (bone infection)o No Have you had any tests for circulation on your legso Yes Eyes Medical History: Negative for: Cataracts; Glaucoma; Optic Neuritis Ear/Nose/Mouth/Throat Medical History: Negative for: Chronic sinus problems/congestion; Middle ear problems Hematologic/Lymphatic Medical History: Negative for: Anemia; Hemophilia; Human Immunodeficiency Virus; Lymphedema; Sickle Cell Disease Respiratory Medical History: Negative for: Aspiration; Asthma; Chronic Obstructive Pulmonary Disease (COPD); Sleep Apnea; Tuberculosis Cardiovascular Medical History: Positive for: Hypertension - medication controlled Negative for: Angina; Arrhythmia; Congestive Heart Failure; Coronary Artery Disease; Deep Vein Thrombosis; Hypotension; Myocardial Infarction; Peripheral Arterial Disease; Peripheral Venous Disease; Phlebitis; Vasculitis Past Medical History  Notes: Right leg stripped veins (03/1998) Gastrointestinal Medical History: Negative for: Cirrhosis ; Colitis; Crohnos; Hepatitis A; Hepatitis [name]; Hepatitis C Endocrine Kuss, Aleria [name]. ([id number]) Medical History: Negative for: Type I Diabetes; Type II Diabetes Genitourinary Medical History: Negative for: End Stage Renal Disease Immunological Medical History: Negative for: Lupus Erythematosus; Raynaudos Integumentary (Skin) Medical History: Negative for: History of Burn; History of pressure wounds Musculoskeletal Medical History: Negative for: Gout; Rheumatoid Arthritis; Osteoarthritis; Osteomyelitis Neurologic Medical History: Negative for: Dementia; Neuropathy; Quadriplegia; Paraplegia; Seizure Disorder Oncologic Medical History: Negative for: Received Chemotherapy; Received Radiation Psychiatric Medical History: Negative for: Anorexia/bulimia; Confinement Anxiety Immunizations Pneumococcal Vaccine: Received Pneumococcal Vaccination: Yes Implantable Devices Family and Social History Cancer: Yes - Siblings; Diabetes: No; Heart Disease: Yes - Father; Hypertension: No; Kidney Disease: No; Lung Disease: No; Seizures: No; Stroke: Yes - Mother; Thyroid Problems: No; Tuberculosis: No; Never smoker; Marital Status - Married; Alcohol Use: Never; Drug Use: No History; Caffeine Use: Daily; Advanced Directives: No; Patient does not want information on Advanced Directives; Do not resuscitate: No; Living Will: No; Medical Power of Attorney: No Physician Affirmation I have reviewed and agree with the above information. Electronic Signature(s) Signed: 11/22/2016 4:32:35 PM By: Christin Fudge MD, FACS Signed: 11/22/2016 5:07:14 PM By: Montey Hora Entered By: Christin Fudge on 11/22/2016 16:18:13 Mortimer, Sharalee [name]. ([id number]) Thaden, Kiandra [name] Kitchen ([id number]) -------------------------------------------------------------------------------- SuperBill Details Patient Name: Teem, Tineshia  [name]. Date of Service: 11/22/2016 Medical Record Number: [id number] Patient Account Number: [id number] Date of Birth/Sex: 01-Apr-1940 (76 y.o. 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 Modifier: posed Quantity: 1 Physician Procedures CPT4 Code: [id number] Description: 22979 - WC PHYS LEVEL 3 - EST PT ICD-10 Diagnosis Description G92.119 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

## 2016-11-23 NOTE — Progress Notes (Signed)
NALEA, SALCE ([id number]) Visit Report for 11/22/2016 Arrival Information Details Patient Name: [name], [name]. Date of Service: 11/22/2016 3:30 PM Medical Record Number: [id number] Patient Account Number: [id number] Date of Birth/Sex: 11-04-1940 (76 y.o. Female) Treating RN: Montey Hora Primary Care Bryony Kaman: Elsie Stain Other Clinician: Referring Nohelani Benning: Elsie Stain Treating Amiayah Giebel/Extender: Frann Rider in Treatment: 3 Visit Information History Since Last Visit Added or deleted any medications: No Patient Arrived: Ambulatory Any new allergies or adverse reactions: No Arrival Time: 15:26 Had a fall or experienced change in No Accompanied By: spouse activities of daily living that may affect Transfer Assistance: None risk of falls: Patient Identification Verified: Yes Signs or symptoms of abuse/neglect since last visito No Secondary Verification Process Yes Hospitalized since last visit: No Completed: Has Dressing in Place as Prescribed: Yes Patient Has Alerts: Yes Has Compression in Place as Prescribed: Yes Patient Alerts: Patient on Blood Pain Present Now: No Thinner 81 MG Aspirin NOT DiaBETIC Electronic Signature(s) Signed: 11/22/2016 5:07:14 PM By: Montey Hora Entered By: Montey Hora on 11/22/2016 15:27:15 Pietrzyk, Brekyn T. ([id number]) -------------------------------------------------------------------------------- Encounter Discharge Information Details Patient Name: Kim Crumble, [name] Date of Service: 11/22/2016 3:30 PM Medical Record Number: [id number] Patient Account Number: [id number] Date of Birth/Sex: [birth year] (76 y.o. Female) Treating RN: Montey Hora Primary Care Hung Rhinesmith: Elsie Stain Other Clinician: Referring Icelyn Navarrete: Elsie Stain Treating Abdulaziz Toman/Extender: Frann Rider in Treatment: 3 Encounter Discharge Information Items Schedule Follow-up Appointment: No Medication Reconciliation completed  and No provided to Patient/Care Mio Schellinger: Provided on Clinical Summary of Care: 11/22/2016 Form Type Recipient Paper Patient [facility] Electronic Signature(s) Signed: 11/23/2016 9:11:10 AM By: Ruthine Dose Entered By: Ruthine Dose on 11/22/2016 16:17:09 [name], [name] ([id number]) -------------------------------------------------------------------------------- Lower Extremity Assessment Details Patient Name: [name], [name] Date of Service: 11/22/2016 3:30 PM Medical Record Number: [id number] Patient Account Number: [id number] Date of Birth/Sex: [birth year] (76 y.o. Female) Treating RN: Montey Hora Primary Care Gal Smolinski: Elsie Stain Other Clinician: Referring Laiklyn Pilkenton: Elsie Stain Treating Aleese Kamps/Extender: Frann Rider in Treatment: 3 Vascular Assessment Pulses: Dorsalis Pedis Palpable: [Right:Yes] Posterior Tibial Extremity colors, hair growth, and conditions: Extremity Color: [Right:Mottled] Hair Growth on Extremity: [Right:No] Temperature of Extremity: [Right:Warm] Capillary Refill: [Right:< 3 seconds] Electronic Signature(s) Signed: 11/22/2016 5:07:14 PM By: Montey Hora Entered By: Montey Hora on 11/22/2016 15:38:17 [name], [name] ([id number]) -------------------------------------------------------------------------------- Multi Wound Chart Details Patient Name: [name], [name] Date of Service: 11/22/2016 3:30 PM Medical Record Number: [id number] Patient Account Number: [id number] Date of Birth/Sex: 1940-04-26 (76 y.o. 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] [N/A:N/A] Comorbid History: [1:Hypertension] [N/A:N/A] Date Acquired: [1:10/15/2016] [N/A:N/A] Weeks of Treatment: [1:3] [N/A:N/A] Wound Status: [1:Open] [N/A:N/A] Measurements L x W x D [1:0.8x0.6x0.1] [N/A:N/A] (cm) Area (cm) : [1:0.377] [N/A:N/A] Volume (cm) : [1:0.038] [N/A:N/A] % Reduction in Area: [1:56.40%] [N/A:N/A] % Reduction in Volume: [1:55.80%] [N/A:N/A] Classification: [1:Partial Thickness] [N/A:N/A] Exudate Amount: [1:Medium] [N/A:N/A] Exudate Type: [1:Serosanguineous] [N/A:N/A] Exudate Color: [1:red, brown] [N/A:N/A] Wound Margin: [1:Flat and Intact] [N/A:N/A] Granulation Amount: [1:None Present (0%)] [N/A:N/A] Necrotic Amount: [1:Large (67-100%)] [N/A:N/A] Exposed Structures: [1:Fat Layer (Subcutaneous Tissue) Exposed: Yes Fascia: No Tendon: No Muscle: No Joint: No Bone: No] [N/A:N/A] Epithelialization: [1:None] [N/A:N/A] Debridement: [1:Debridement ([id number])] [N/A:N/A]  Pre-procedure [1:15:53] [N/A:N/A] Verification/Time Out Taken: Pain Control: [1:Lidocaine 4% Topical Solution] [N/A:N/A] Tissue Debrided: [1:Necrotic/Eschar, Fibrin/Slough, Subcutaneous] [N/A:N/A] Level: [1:Skin/Subcutaneous Tissue] [N/A:N/A] Debridement Area (sq cm): 0.48 [N/A:N/A] Instrument: Forceps N/A N/A Bleeding: Minimum N/A N/A Hemostasis Achieved: Pressure N/A N/A Procedural Pain: 6 N/A N/A Post Procedural Pain: 0 N/A N/A Debridement Treatment Procedure was tolerated well N/A N/A Response: Post Debridement 0.8x0.6x0.2 N/A N/A Measurements L x W x D (cm) Post Debridement Volume: 0.075 N/A N/A (cm) Periwound Skin Texture: Excoriation: No N/A N/A Induration: No Callus: No Crepitus: No Rash: No Scarring: No Periwound Skin Moisture: Maceration: No N/A N/A Dry/Scaly: No Periwound Skin Color: Ecchymosis: Yes N/A N/A Atrophie Blanche: No Cyanosis: No Erythema:  No Hemosiderin Staining: No Mottled: No Pallor: No Rubor: No Tenderness on Palpation: No N/A N/A Wound Preparation: Ulcer Cleansing: N/A N/A Rinsed/Irrigated with Saline, Other: soap and water Topical Anesthetic Applied: Other: lidocaine 4% Procedures Performed: Debridement N/A N/A Treatment Notes Electronic Signature(s) Signed: 11/22/2016 4:16:54 PM By: Christin Fudge MD, FACS Entered By: Christin Fudge on 11/22/2016 16:16:54 Monarch, Caren Macadam ([id number]) -------------------------------------------------------------------------------- [place] Details Patient Name: Kim Crumble, Zamyiah T. Date of Service: 11/22/2016 3:30 PM Medical Record Number: [id number] Patient Account Number: [id number] Date of Birth/Sex: 1940-09-14 (76 y.o. Female) Treating RN: Montey Hora Primary Care Amogh Komatsu: Elsie Stain Other Clinician: Referring Mikhala Kenan: Elsie Stain Treating Eloy Fehl/Extender: Frann Rider in Treatment: 3 Active Inactive ` Abuse / Safety / Falls / Self Care Management Nursing Diagnoses: Potential for falls Goals: Patient will not experience any injury related to falls Date Initiated: 10/29/2016 Target Resolution Date: 11/15/2016 Goal Status: Active Interventions: Podiatry chair, stretcher in low position and side rails up as needed Notes: ` Orientation to the Wound Care Program Nursing Diagnoses: Knowledge deficit related to the wound healing center program Goals: Patient/caregiver will verbalize understanding of the [place] Date Initiated: 10/29/2016 Target Resolution Date: 11/15/2016 Goal Status: Active Interventions: Provide education on orientation to the wound center Notes: ` Venous Leg Ulcer Nursing Diagnoses: Potential for venous Insuffiency (use before diagnosis confirmed) Goals: Patient will maintain optimal edema control Date Initiated: 10/29/2016 Target Resolution Date: 11/15/2016 Goal Status:  Active Interventions: Leibold, Anne-Marie T. ([id number]) Assess peripheral edema status every visit. Treatment Activities: Non-invasive vascular studies : 10/29/2016 Notes: ` Wound/Skin Impairment Nursing Diagnoses: Impaired tissue integrity Goals: Ulcer/skin breakdown will heal within 14 weeks Date Initiated: 10/29/2016 Target Resolution Date: 02/14/2017 Goal Status: Active Interventions: Assess ulceration(s) every visit Treatment Activities: Referred to DME Madelyne Millikan for dressing supplies : 10/29/2016 Notes: Electronic Signature(s) Signed: 11/22/2016 5:07:14 PM By: Montey Hora Entered By: Montey Hora on 11/22/2016 15:47:11 Benedick, Shresta T. ([id number]) -------------------------------------------------------------------------------- Pain Assessment Details Patient Name: Kim Crumble, Nakkia T. Date of Service: 11/22/2016 3:30 PM Medical Record Number: [id number] Patient Account Number: [id number] Date of Birth/Sex: Jun 24, 1940 (76 y.o. Female) Treating RN: Montey Hora Primary Care Alanzo Lamb: Elsie Stain Other Clinician: Referring Alvine Mostafa: Elsie Stain Treating Keylin Ferryman/Extender: Frann Rider in Treatment: 3 Active Problems Location of Pain Severity and Description of Pain Patient Has Paino No Site Locations Pain Management and Medication Current Pain Management: Notes Topical or injectable lidocaine is offered to patient for acute pain when surgical debridement is performed. If needed, Patient is instructed to use over the counter pain medication for the following 24-48 hours after debridement. Wound care MDs do not prescribed pain medications. Patient has chronic pain or uncontrolled pain. Patient has been instructed to make an appointment with their Primary Care Physician for pain management. Electronic Signature(s) Signed: 11/22/2016  5:07:14 PM By: Montey Hora Entered By: Montey Hora on 11/22/2016 15:31:34 Larsson, Rex TMarland Kitchen  ([id number]) -------------------------------------------------------------------------------- Wound Assessment Details Patient Name: Laube, Corazon T. Date of Service: 11/22/2016 3:30 PM Medical Record Number: [id number] Patient Account Number: [id number] Date of Birth/Sex: 12/02/1940 (76 y.o. Female) Treating RN: Montey Hora Primary Care Torrian Canion: Elsie Stain Other Clinician: Referring Ozro Russett: Elsie Stain Treating Maydell Knoebel/Extender: Frann Rider in Treatment: 3 Wound Status Wound Number: 1 Primary Etiology: Vasculopathy Wound Location: Right Ankle - Medial Wound Status: Open Wounding Event: Gradually Appeared Comorbid History: Hypertension Date Acquired: 10/15/2016 Weeks Of Treatment: 3 Clustered Wound: No Photos Photo Uploaded By: Montey Hora on 11/23/2016 16:38:51 Wound Measurements Length: (cm) 0.8 Width: (cm) 0.6 Depth: (cm) 0.1 Area: (cm) 0.377 Volume: (cm) 0.038 % Reduction in Area: 56.4% % Reduction in Volume: 55.8% Epithelialization: None Tunneling: No Undermining: No Wound Description Classification: Partial Thickness Wound Margin: Flat and Intact Exudate Amount: Medium Exudate Type: Serosanguineous Exudate Color: red, brown Foul Odor After Cleansing: No Slough/Fibrino Yes Wound Bed Granulation Amount: None Present (0%) Exposed Structure Necrotic Amount: Large (67-100%) Fascia Exposed: No Necrotic Quality: Adherent Slough Fat Layer (Subcutaneous Tissue) Exposed: Yes Tendon Exposed: No Muscle Exposed: No Joint Exposed: No Bone Exposed: No Periwound Skin Texture Mucha, Raquel T. ([id number]) Texture Color No Abnormalities Noted: No No Abnormalities Noted: No Callus: No Atrophie Blanche: No Crepitus: No Cyanosis: No Excoriation: No Ecchymosis: Yes Induration: No Erythema: No Rash: No Hemosiderin Staining: No Scarring: No Mottled: No Pallor: No Moisture Rubor: No No Abnormalities Noted: No Dry / Scaly:  No Maceration: No Wound Preparation Ulcer Cleansing: Rinsed/Irrigated with Saline, Other: soap and water, Topical Anesthetic Applied: Other: lidocaine 4%, Electronic Signature(s) Signed: 11/22/2016 5:07:14 PM By: Montey Hora Entered By: Montey Hora on 11/22/2016 15:37:47 Kohut, Chairty T. ([id number]) -------------------------------------------------------------------------------- Vitals Details Patient Name: Kim Crumble, Allien T. Date of Service: 11/22/2016 3:30 PM Medical Record Number: [id number] Patient Account Number: [id number] Date of Birth/Sex: 1940-07-01 (76 y.o. Female) Treating RN: Montey Hora Primary Care Tatiyanna Lashley: Elsie Stain Other Clinician: Referring Venetta Knee: Elsie Stain Treating Marchella Hibbard/Extender: Frann Rider in Treatment: 3 Vital Signs Time Taken: 15:39 Temperature (F): 98.5 Height (in): 68 Pulse (bpm): 66 Weight (lbs): 191.1 Respiratory Rate (breaths/min): 18 Body Mass Index (BMI): 29.1 Blood Pressure (mmHg): 126/61 Reference Range: 80 - 120 mg / dl Electronic Signature(s) Signed: 11/22/2016 5:07:14 PM By: Montey Hora Entered By: Montey Hora on 11/22/2016 15:39:50

## 2016-11-29 ENCOUNTER — Encounter: Payer: Medicare Other | Admitting: Surgery

## 2016-11-29 DIAGNOSIS — L97312 Non-pressure chronic ulcer of right ankle with fat layer exposed: Secondary | ICD-10-CM | POA: Diagnosis not present

## 2016-11-29 DIAGNOSIS — I10 Essential (primary) hypertension: Secondary | ICD-10-CM | POA: Diagnosis not present

## 2016-11-29 DIAGNOSIS — I872 Venous insufficiency (chronic) (peripheral): Secondary | ICD-10-CM | POA: Diagnosis not present

## 2016-11-29 DIAGNOSIS — I890 Lymphedema, not elsewhere classified: Secondary | ICD-10-CM | POA: Diagnosis not present

## 2016-11-29 DIAGNOSIS — I83013 Varicose veins of right lower extremity with ulcer of ankle: Secondary | ICD-10-CM | POA: Diagnosis not present

## 2016-11-30 ENCOUNTER — Ambulatory Visit (INDEPENDENT_AMBULATORY_CARE_PROVIDER_SITE_OTHER): Payer: Medicare Other | Admitting: Vascular Surgery

## 2016-11-30 ENCOUNTER — Encounter (INDEPENDENT_AMBULATORY_CARE_PROVIDER_SITE_OTHER): Payer: Self-pay | Admitting: Vascular Surgery

## 2016-11-30 VITALS — BP 136/64 | HR 67 | Resp 16 | Ht 68.0 in | Wt 191.0 lb

## 2016-11-30 DIAGNOSIS — L97511 Non-pressure chronic ulcer of other part of right foot limited to breakdown of skin: Secondary | ICD-10-CM | POA: Diagnosis not present

## 2016-11-30 DIAGNOSIS — I83019 Varicose veins of right lower extremity with ulcer of unspecified site: Secondary | ICD-10-CM | POA: Insufficient documentation

## 2016-11-30 DIAGNOSIS — E785 Hyperlipidemia, unspecified: Secondary | ICD-10-CM | POA: Diagnosis not present

## 2016-11-30 DIAGNOSIS — L97919 Non-pressure chronic ulcer of unspecified part of right lower leg with unspecified severity: Secondary | ICD-10-CM

## 2016-11-30 DIAGNOSIS — I10 Essential (primary) hypertension: Secondary | ICD-10-CM

## 2016-11-30 NOTE — Progress Notes (Signed)
Patient ID: [name], female   DOB: 05-09-1940, 76 y.o.   MRN: [id number]  Chief Complaint  Patient presents with  . New Patient (Initial Visit)    u/s results    HPI [name] is a 76 y.o. female.  I am asked to see the patient by Dr. Con Memos for evaluation of non-healing ulceration of the right leg.  The patient reports right medial foot and ankle ulceration of only about a centimeter in size starting about 2 weeks ago.  She has been under the care of the wound care center and secondary to there are excellent wound care, the wound has gone on to heal just this week.  This was not traumatic.  There is no clear inciting event or causative factor that started the wound.  She had a similar but much larger wound 18 years ago and had her great saphenous vein stripped at that time.  No fever or chills.  Only mild lower extremity swelling. She has undergone noninvasive studies here in our office which demonstrated normal triphasic arterial waveforms without significant stenosis in either lower extremity.  Her venous duplex shows no evidence of DVT or superficial thrombophlebitis.  She did have reflux and a right great saphenous vein branch which likely represents the anterior accessory saphenous vein up to the saphenofemoral junction.  The remainder of the great saphenous vein was absent.   Past Medical History:  Diagnosis Date  . Back pain   . Endometriosis   . Hypercholesteremia   . Hypothyroidism   . Migraine   . Venous stasis ulcer ([place]) 11/1997   Wound care center, [place]    Past Surgical History:  Procedure Laterality Date  . ABDOMINAL HYSTERECTOMY     1 1/2 ovaries gone, second to endometriosis  . APPENDECTOMY    . CYSTOSCOPY    . HEMORRHOID SURGERY    . HERNIA REPAIR    . ROTATOR CUFF REPAIR    . THYROIDECTOMY      Family History  Problem Relation Age of Onset  . Stroke Mother   . Goiter Mother   . Stomach cancer Sister   . Heart attack Father   .  Heart disease Father   . Heart attack Brother   . Kidney disease Brother   . Colon cancer Neg Hx   . Esophageal cancer Neg Hx      Social History Social History   Tobacco Use  . Smoking status: Never Smoker  . Smokeless tobacco: Never Used  Substance Use Topics  . Alcohol use: No    Alcohol/week: 0.0 oz  . Drug use: No     Allergies  Allergen Reactions  . Codeine     REACTION: NAUSEA AND VOMITING  . Tramadol     Vomiting, LOC    Current Outpatient Medications  Medication Sig Dispense Refill  . aspirin EC 81 MG tablet Take 81 mg by mouth daily.    [name] atenolol (TENORMIN) 50 MG tablet Take 1 tablet (50 mg total) by mouth daily. 90 tablet 3  . butalbital-acetaminophen-caffeine (FIORICET, ESGIC) 50-325-40 MG tablet TAKE ONE TABLET BY MOUTH TWICE A DAY AS NEEDED FOR HEADACHE 60 tablet 0  . Cholecalciferol (VITAMIN D-3) 1000 UNITS CAPS Take 1 capsule by mouth daily.    [name] doxycycline (VIBRA-TABS) 100 MG tablet Take 1 tablet (100 mg total) by mouth 2 (two) times daily. 20 tablet 0  . ibuprofen (ADVIL,MOTRIN) 800 MG tablet TAKE 1 TABLET BY MOUTH EVERY  4 TO 6 HOURS AS NEEDED 180 tablet 3  . levothyroxine (SYNTHROID, LEVOTHROID) 100 MCG tablet TAKE 1 TABLET (100 MCG TOTAL) BY MOUTH DAILY BEFORE BREAKFAST. 90 tablet 3  . Multiple Vitamin (MULTIVITAMIN) tablet Take 1 tablet by mouth daily.      [name] omeprazole (PRILOSEC) 20 MG capsule Take 1 capsule (20 mg total) by mouth daily. 90 capsule 3  . simvastatin (ZOCOR) 40 MG tablet Take 1 tablet (40 mg total) by mouth at bedtime. 90 tablet 3  . SUMAtriptan (IMITREX) 20 MG/ACT nasal spray Place 1 spray into the nose as needed.       No current facility-administered medications for this visit.       REVIEW OF SYSTEMS (Negative unless checked)  Constitutional: [] Weight loss  [] Fever  [] Chills Cardiac: [] Chest pain   [] Chest pressure   [] Palpitations   [] Shortness of breath when laying flat   [] Shortness of breath at rest   [] Shortness of  breath with exertion. Vascular:  [] Pain in legs with walking   [] Pain in legs at rest   [] Pain in legs when laying flat   [] Claudication   [] Pain in feet when walking  [] Pain in feet at rest  [] Pain in feet when laying flat   [] History of DVT   [] Phlebitis   [x] Swelling in legs   [] Varicose veins   [x] Non-healing ulcers Pulmonary:   [] Uses home oxygen   [] Productive cough   [] Hemoptysis   [] Wheeze  [] COPD   [] Asthma Neurologic:  [] Dizziness  [] Blackouts   [] Seizures   [] History of stroke   [] History of TIA  [] Aphasia   [] Temporary blindness   [] Dysphagia   [] Weakness or numbness in arms   [] Weakness or numbness in legs Musculoskeletal:  [] Arthritis   [] Joint swelling   [] Joint pain   [] Low back pain Hematologic:  [] Easy bruising  [] Easy bleeding   [] Hypercoagulable state   [] Anemic  [] Hepatitis Gastrointestinal:  [] Blood in stool   [] Vomiting blood  [] Gastroesophageal reflux/heartburn   [] Abdominal pain Genitourinary:  [] Chronic kidney disease   [] Difficult urination  [] Frequent urination  [] Burning with urination   [] Hematuria Skin:  [] Rashes   [x] Ulcers   [x] Wounds Psychological:  [] History of anxiety   []  History of major depression.    Physical Exam BP 136/64   Pulse 67   Resp 16   Ht 5\' 8"  (1.727 m)   Wt 191 lb (86.6 kg)   BMI 29.04 kg/m  Gen:  WD/WN, NAD Head: [place]/AT, No temporalis wasting.  Ear/Nose/Throat: Hearing grossly intact, nares w/o erythema or drainage, oropharynx w/o Erythema/Exudate Eyes: Conjunctiva clear, sclera non-icteric  Neck: trachea midline.  No JVD.  Pulmonary:  Good air movement, respirations not labored, no use of accessory muscles Cardiac: RRR, no JVD Vascular: Prominent varicosities throughout the right foot and ankle area as well as the lower leg.  Left lower extremity with mild varicosities Vessel Right Left  Radial Palpable Palpable                      Popliteal Palpable Palpable  PT Palpable Palpable  DP Palpable Palpable   Gastrointestinal:  soft, non-tender/non-distended.  Musculoskeletal: M/S 5/5 throughout.  Extremities without ischemic changes.  No deformity or atrophy.  1+ right lower extremity edema.  Healed ulceration just below the right medial ankle which is dressed today. Neurologic: Sensation grossly intact in extremities.  Symmetrical.  Speech is fluent. Motor exam as listed above. Psychiatric: Judgment intact, Mood & affect appropriate for pt's clinical  situation. Dermatologic: Healed ulceration just below the right medial  which is dressed today   Radiology No results found.  Labs Recent Results (from the past 2160 hour(s))  TSH     Status: Abnormal   Collection Time: 09/13/16  8:52 AM  Result Value Ref Range   TSH 0.32 (L) 0.35 - 4.50 uIU/mL  T4, Free     Status: None   Collection Time: 09/13/16  8:52 AM  Result Value Ref Range   Free T4 0.95 0.60 - 1.60 ng/dL    Comment: Specimens from patients who are undergoing biotin therapy and /or ingesting biotin supplements may contain high levels of biotin.  The higher biotin concentration in these specimens interferes with this Free T4 assay.  Specimens that contain high levels  of biotin may cause false high results for this Free T4 assay.  Please interpret results in light of the total clinical presentation of the patient.    Comprehensive metabolic panel     Status: Abnormal   Collection Time: 09/13/16  8:52 AM  Result Value Ref Range   Sodium 140 135 - 145 mEq/L   Potassium 4.1 3.5 - 5.1 mEq/L   Chloride 106 96 - 112 mEq/L   CO2 28 19 - 32 mEq/L   Glucose, Bld 107 (H) 70 - 99 mg/dL   BUN 13 6 - 23 mg/dL   Creatinine, Ser 0.66 0.40 - 1.20 mg/dL   Total Bilirubin 0.6 0.2 - 1.2 mg/dL   Alkaline Phosphatase 81 39 - 117 U/L   AST 33 0 - 37 U/L   ALT 32 0 - 35 U/L   Total Protein 6.9 6.0 - 8.3 g/dL   Albumin 4.0 3.5 - 5.2 g/dL   Calcium 9.0 8.4 - 10.5 mg/dL   GFR 92.56 >60.00 mL/min  Lipid panel     Status: None   Collection Time: 09/13/16  8:52 AM    Result Value Ref Range   Cholesterol 142 0 - 200 mg/dL    Comment: ATP III Classification       Desirable:  < 200 mg/dL               Borderline High:  200 - 239 mg/dL          High:  > = 240 mg/dL   Triglycerides 117.0 0.0 - 149.0 mg/dL    Comment: Normal:  <150 mg/dLBorderline High:  150 - 199 mg/dL   HDL 39.80 >39.00 mg/dL   VLDL 23.4 0.0 - 40.0 mg/dL   LDL Cholesterol 79 0 - 99 mg/dL   Total CHOL/HDL Ratio 4     Comment:                Men          Women1/2 Average Risk     3.4          3.3Average Risk          5.0          4.42X Average Risk          9.6          7.13X Average Risk          15.0          11.0                       NonHDL 102.62     Comment: NOTE:  Non-HDL goal should be 30 mg/dL higher than patient's LDL  goal (i.e. LDL goal of < 70 mg/dL, would have non-HDL goal of < 100 mg/dL)    Assessment/Plan:  HTN (hypertension) blood pressure control important in reducing the progression of atherosclerotic disease. On appropriate oral medications.   HLD (hyperlipidemia) lipid control important in reducing the progression of atherosclerotic disease. Continue statin therapy   Skin ulcer of right foot, limited to breakdown of skin ([place]) She has undergone noninvasive studies here in our office which demonstrated normal triphasic arterial waveforms without significant stenosis in either lower extremity.  Her venous duplex shows no evidence of DVT or superficial thrombophlebitis.  She did have reflux and a right great saphenous vein branch which likely represents the anterior accessory saphenous vein up to the saphenofemoral junction.  The remainder of the great saphenous vein was absent. We had a long discussion today regarding the treatment options and the care for these venous stasis ulcers.  The fact that her great saphenous vein has already been stripped is encouraging.  The anterior accessory saphenous vein reflux likely contributes to the wound, but it is not as large a risk  factor for recurrence.  The lack of arterial insufficiency is also encouraging.  I have discussed compression stockings and elevating the legs and recommended that she wear these on a daily basis.  I have recommended continuing to pad the area until the skin is fully healed.  Venous intervention is certainly reasonable and she and I discussed that if she has a recurrence of venous stasis ulceration, consideration for foam sclerotherapy for the incompetent accessory saphenous vein would be performed.  Otherwise, she does not want any attention and I think that is reasonable.  Varicose veins with ulcer, right ([place]) She has undergone noninvasive studies here in our office which demonstrated normal triphasic arterial waveforms without significant stenosis in either lower extremity.  Her venous duplex shows no evidence of DVT or superficial thrombophlebitis.  She did have reflux and a right great saphenous vein branch which likely represents the anterior accessory saphenous vein up to the saphenofemoral junction.  The remainder of the great saphenous vein was absent. We had a long discussion today regarding the treatment options and the care for these venous stasis ulcers.  The fact that her great saphenous vein has already been stripped is encouraging.  The anterior accessory saphenous vein reflux likely contributes to the wound, but it is not as large a risk factor for recurrence.  The lack of arterial insufficiency is also encouraging.  I have discussed compression stockings and elevating the legs and recommended that she wear these on a daily basis.  I have recommended continuing to pad the area until the skin is fully healed.  Venous intervention is certainly reasonable and she and I discussed that if she has a recurrence of venous stasis ulceration, consideration for foam sclerotherapy for the incompetent accessory saphenous vein would be performed.  Otherwise, she does not want any attention and I think that  is reasonable.      Leotis Pain 11/30/2016, 3:56 PM   This note was created with Dragon medical transcription system.  Any errors from dictation are unintentional.

## 2016-11-30 NOTE — Patient Instructions (Signed)

## 2016-11-30 NOTE — Assessment & Plan Note (Signed)
blood pressure control important in reducing the progression of atherosclerotic disease. On appropriate oral medications.  

## 2016-11-30 NOTE — Assessment & Plan Note (Signed)
She has undergone noninvasive studies here in our office which demonstrated normal triphasic arterial waveforms without significant stenosis in either lower extremity.  Her venous duplex shows no evidence of DVT or superficial thrombophlebitis.  She did have reflux and a right great saphenous vein branch which likely represents the anterior accessory saphenous vein up to the saphenofemoral junction.  The remainder of the great saphenous vein was absent. We had a long discussion today regarding the treatment options and the care for these venous stasis ulcers.  The fact that her great saphenous vein has already been stripped is encouraging.  The anterior accessory saphenous vein reflux likely contributes to the wound, but it is not as large a risk factor for recurrence.  The lack of arterial insufficiency is also encouraging.  I have discussed compression stockings and elevating the legs and recommended that she wear these on a daily basis.  I have recommended continuing to pad the area until the skin is fully healed.  Venous intervention is certainly reasonable and she and I discussed that if she has a recurrence of venous stasis ulceration, consideration for foam sclerotherapy for the incompetent accessory saphenous vein would be performed.  Otherwise, she does not want any attention and I think that is reasonable.

## 2016-11-30 NOTE — Progress Notes (Signed)
[name], [name] ([id number]) Visit Report for 11/29/2016 Chief Complaint Document Details Patient Name: [name] [name] [name] [name]. Date of Service: 11/29/2016 8:30 AM Medical Record Number: [id number] Patient Account Number: [id number] Date of Birth/Sex: 1940/07/03 (76 y.o. Female) Treating RN: Primary Care Provider: Elsie [name] Other Clinician: Referring Provider: Elsie [name] Treating Provider/Extender: [name] [name] in Treatment: 4 Information Obtained from: Patient Chief Complaint Patient presents for treatment of an open ulcer due to venous insufficiency Situated at her right medial ankle for about 3 weeks Electronic Signature(s) Signed: 11/29/2016 9:21:39 AM By: [name] Fudge MD, FACS Entered By: [name] [name] on 11/29/2016 09:21:39 [name] [name] [name]. ([id number]) -------------------------------------------------------------------------------- HPI Details Patient Name: [name] [name]. Date of Service: 11/29/2016 8:30 AM Medical Record Number: [id number] Patient Account Number: [id number] Date of Birth/Sex: 04-Feb-1940 (76 y.o. Female) Treating RN: Primary Care Provider: Elsie [name] Other Clinician: Referring Provider: Elsie [name] Treating Provider/Extender: [name] [name] in Treatment: 4 History of Present Illness Location: Patient presents with an ulcer on the right medial ankle. Quality: Patient reports experiencing a dull pain to affected area(s). Severity: Patient states wound are getting worse. Duration: Patient has had the wound for < 4 weeks prior to presenting for treatment Timing: Pain in wound is Intermittent (comes and goes Context: The wound would happen gradually Modifying Factors: Other treatment(s) tried include:local compression stockings recently Associated Signs and Symptoms: Patient reports having increase swelling. HPI Description: 76 year old patient was recently seen by her PCP Dr. Elsie [name] and he noted erythema and ulceration and  started her on doxycycline. He noted a intact dorsalis pedis pulse.the medial right ankle had skin ulceration which had been treated by Keflex recently and notices made that she had had wound care treatments earlier. past medical history significant for back pain, endometriosis, high cholesterol, hypothyroidism, migraine and venous stasis ulcer treated at [facility]. she has never been a smoker the patient gives a history of right varicose vein stripping done in the year 2000 at [facility]. She has not been wearing her compression since. 11/05/2016 -- the venous duplex study has been done for reflux and we are awaiting her arterial study to be done. The patient is of a very nervous disposition and seems to be very anxious. 11/15/2016 -- venous duplex reflux study -- venous incompetence noted in the right saphenofemoral junction and the right great saphenous vein on the right lower extremity. The right great saphenous vein was harvested as mentioned above. Arterial study was done on 11/12/2016 but the results are not yet available. I will refer her to see the vascular surgeons for an opinion. 11/22/2016 -- lower extremity arterial duplex examination done showed no hemodynamically significant stenosis in bilateral arterial flow examination. As far as a venous examination goes she had declined to get her left leg studied and the right leg is also noted on 11/15/2016. On asking the patient about this she says she never told the technician not to do her left leg but it may have been a misunderstanding. The patient will be seeing the vascular surgeons soon and I have asked them to address whether the left leg needs a venous duplex study Electronic Signature(s) Signed: 11/29/2016 9:21:46 AM By: [name] Fudge MD, FACS Entered By: [name] [name] on 11/29/2016 09:21:45 [name] [name] [name] [name] ([id number]) -------------------------------------------------------------------------------- Physical Exam  Details Patient Name: [name], [name]. Date of Service: 11/29/2016 8:30 AM Medical Record Number: [id number] Patient Account Number: [id number] Date of Birth/Sex: 18-Jul-1940 (76 y.o. Female) Treating RN: Primary Care Provider: Elsie [name] Other Clinician: Referring  Provider: Elsie [name] Treating Provider/Extender: [name] [name] in Treatment: 4 Constitutional . Pulse regular. Respirations normal and unlabored. Afebrile. . Eyes Nonicteric. Reactive to light. Ears, Nose, Mouth, and Throat Lips, teeth, and gums WNL.[name] Moist mucosa without lesions. Neck supple and nontender. No palpable supraclavicular or cervical adenopathy. Normal sized without goiter. Respiratory WNL. No retractions.. Cardiovascular Pedal Pulses WNL. No clubbing, cyanosis or edema. Lymphatic No adneopathy. No adenopathy. No adenopathy. Musculoskeletal Adexa without tenderness or enlargement.. Digits and nails w/o clubbing, cyanosis, infection, petechiae, ischemia, or inflammatory conditions.. Integumentary (Hair, Skin) No suspicious lesions. No crepitus or fluctuance. No peri-wound warmth or erythema. No masses.[name] Psychiatric Judgement and insight Intact.. No evidence of depression, anxiety, or agitation.. Notes the wound is looking very good today and there is minimal open area and there is good control of her lymphedema. Electronic Signature(s) Signed: 11/29/2016 9:22:30 AM By: [name] Fudge MD, FACS Entered By: [name] [name] on 11/29/2016 09:22:29 [name] [name] ([id number]) -------------------------------------------------------------------------------- Physician Orders Details Patient Name: [name] [name] [name] [name]. Date of Service: 11/29/2016 8:30 AM Medical Record Number: [id number] Patient Account Number: [id number] Date of Birth/Sex: [birth year] (76 y.o. Female) Treating RN: Carolyne Fiscal, Debi Primary Care Provider: Elsie [name] Other Clinician: Referring Provider: Elsie [name] Treating  Provider/Extender: [name] [name] in Treatment: 4 Verbal / Phone Orders: Yes Clinician: Pinkerton, Debi Read Back and Verified: Yes Diagnosis Coding Wound Cleansing Wound #1 Right,Medial Ankle o Clean wound with Normal Saline. o Cleanse wound with mild soap and water Anesthetic Wound #1 Right,Medial Ankle o Topical Lidocaine 4% cream applied to wound bed prior to debridement Skin Barriers/Peri-Wound Care Wound #1 Right,Medial Ankle o Skin Prep Secondary Dressing Wound #1 Right,Medial Ankle o Dry Gauze o Boardered Foam Dressing Follow-up Appointments Wound #1 Right,Medial Ankle o Return Appointment in 2 weeks. Edema Control Wound #1 Right,Medial Ankle o Elevate legs to the level of the heart and pump ankles as often as possible o Other: - Pt to wear own compression. Additional Orders / Instructions Wound #1 Right,Medial Ankle o Increase protein intake. Electronic Signature(s) Signed: 11/29/2016 9:19:35 AM By: Alric Quan Signed: 11/29/2016 4:30:10 PM By: [name] Fudge MD, FACS Entered By: Alric Quan on 11/29/2016 09:19:35 [name] [name] [name] TMarland Kitchen ([id number]) -------------------------------------------------------------------------------- Problem List Details Patient Name: [name] [name] [name] [name]. Date of Service: 11/29/2016 8:30 AM Medical Record Number: [id number] Patient Account Number: [id number] Date of Birth/Sex: [birth year] (76 y.o. 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 ([id number]) -------------------------------------------------------------------------------- Progress Note Details Patient Name: [name] [name] [name] [name]. Date of Service: 11/29/2016 8:30 AM Medical Record Number: [id number] Patient Account Number: [id number] Date of Birth/Sex: 1940/03/20 (76 y.o. Female) Treating RN: Primary Care Provider: Elsie [name] Other Clinician: Referring Provider: Elsie [name] Treating Provider/Extender: [name] [name] in Treatment: 4 Subjective Chief Complaint Information obtained from Patient Patient presents for treatment of an open ulcer due to venous insufficiency Situated at her right medial ankle for about 3 weeks History of Present Illness (HPI) The following HPI elements were documented for the patient's wound: Location: Patient presents with an ulcer on the right medial ankle. Quality: Patient reports experiencing  a dull pain to affected area(s). Severity: Patient states wound are getting worse. Duration: Patient has had the wound for < 4 weeks prior to presenting for treatment Timing: Pain in wound is Intermittent (comes and goes Context: The wound would happen gradually Modifying Factors: Other treatment(s) tried include:local compression stockings recently Associated Signs and Symptoms: Patient reports having increase swelling. 76 year old patient was recently seen by her PCP Dr. Elsie [name] and he noted erythema and ulceration and started her on doxycycline. He noted a intact dorsalis pedis pulse.the medial right ankle had skin ulceration which had been treated by Keflex recently and notices made that she had had wound care treatments earlier. past medical history significant for back pain, endometriosis, high cholesterol, hypothyroidism, migraine and venous stasis ulcer treated at [facility]. she has never been a smoker the patient gives a history of  right varicose vein stripping done in the year 2000 at [facility]. She has not been wearing her compression since. 11/05/2016 -- the venous duplex study has been done for reflux and we are awaiting her arterial study to be done. The patient is of a very nervous disposition and seems to be very anxious. 11/15/2016 -- venous duplex reflux study -- venous incompetence noted in the right saphenofemoral junction and the right great saphenous vein on the right lower extremity. The right great saphenous vein was harvested as mentioned above. Arterial study was done on 11/12/2016 but the results are not yet available. I will refer her to see the vascular surgeons for an opinion. 11/22/2016 -- lower extremity arterial duplex examination done showed no hemodynamically significant stenosis in bilateral arterial flow examination. As far as a venous examination goes she had declined to get her left leg studied and the right leg is also noted on 11/15/2016. On asking the patient about this she says she never told the technician not to do her left leg but it may have been a misunderstanding. The patient will be seeing the vascular surgeons soon and I have asked them to address whether the left leg needs a venous duplex study Patient History Information obtained from Patient. Family History Cancer - Siblings, Heart Disease - Father, Stroke - Mother, [name] [name] [name] [name]. ([id number]) No family history of Diabetes, Hypertension, Kidney Disease, Lung Disease, Seizures, Thyroid Problems, Tuberculosis. Social History Never smoker, Marital Status - Married, Alcohol Use - Never, Drug Use - No History, Caffeine Use - Daily. 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 Respirations normal and unlabored. Afebrile. Vitals Time Taken: 8:36 AM, Height: 68 in, Weight: 191.1 lbs, BMI: 29.1, Temperature: 97.6 F, Pulse: 68 bpm,  Respiratory Rate: 18 breaths/min, Blood Pressure: 149/56 mmHg. Eyes Nonicteric. Reactive to light. Ears, Nose, Mouth, and Throat Lips, teeth, and gums WNL.[name] Moist mucosa without lesions. Neck supple and nontender. No palpable supraclavicular or cervical adenopathy. Normal sized without goiter. Respiratory WNL. No retractions.. Cardiovascular Pedal Pulses WNL. No clubbing, cyanosis or edema. Lymphatic No adneopathy. No adenopathy. No adenopathy. Musculoskeletal Adexa without tenderness or enlargement.. Digits and nails w/o clubbing, cyanosis, infection, petechiae, ischemia, or inflammatory conditions.[name] Psychiatric Judgement and insight Intact.. No evidence of depression, anxiety, or agitation.. General Notes: the wound is looking very good today and there is minimal open area and there is good control of her lymphedema. Integumentary (Hair, Skin) No suspicious lesions. No crepitus or fluctuance. No peri-wound warmth or erythema. No masses.[name] [name], Samamtha TMarland Kitchen ([id number]) Wound #1 status is Open. Original cause of wound was Gradually  Appeared. The wound is located on the Right,Medial Ankle. The wound measures 0.1cm length x 0.1cm width x 0.1cm depth; 0.008cm^2 area and 0.001cm^3 volume. There is Fat Layer (Subcutaneous Tissue) Exposed exposed. There is no tunneling or undermining noted. There is a medium amount of serosanguineous drainage noted. The wound margin is flat and intact. There is no granulation within the wound bed. There is a large (67-100%) amount of necrotic tissue within the wound bed including Adherent Slough. The periwound skin appearance exhibited: Ecchymosis. The periwound skin appearance did not exhibit: Callus, Crepitus, Excoriation, Induration, Rash, Scarring, Dry/Scaly, Maceration, Atrophie Blanche, Cyanosis, Hemosiderin Staining, Mottled, Pallor, Rubor, Erythema. 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 Cleanse wound with mild soap and water Anesthetic: Wound #1 Right,Medial Ankle: Topical Lidocaine 4% cream applied to wound bed prior to debridement Skin Barriers/Peri-Wound Care: Wound #1 Right,Medial Ankle: Skin Prep Secondary Dressing: Wound #1 Right,Medial Ankle: Dry Gauze Boardered Foam Dressing Follow-up Appointments: Wound #1 Right,Medial Ankle: Return Appointment in 2 weeks. Edema Control: Wound #1 Right,Medial Ankle: Elevate legs to the level of the heart and pump ankles as often as possible Other: - Pt to wear own compression. Additional Orders / Instructions: Wound #1 Right,Medial Ankle: Increase protein intake. [name] [name] [name] [name] ([id number]) the patient has made very good progress with the wound and compression wraps and today after review I have recommended: 1. Measure her for compression stockings of the 30-40 millimeters of mercury variety to be applied, and get her a dual layer and also her juxta light 2. She will use her own compression stockings this week and elevation and exercise has been discussed with her in great detail 3. She will see the vascular surgeon tomorrow for an opinion regarding her varicose veins 4. She will be reviewed by me next week for a final visit to make sure she has her compression stockings and her plan to keep this venous ulceration healed Electronic Signature(s) Signed: 11/29/2016 9:24:21 AM By: [name] Fudge MD, FACS Entered By: [name] [name] on 11/29/2016 09:24:21 [name] [name] [name] [name] ([id number]) -------------------------------------------------------------------------------- ROS/PFSH Details Patient Name: [name] [name], Alayshia [name]. Date of Service: 11/29/2016 8:30 AM Medical Record Number: [id number] Patient Account Number: [id number] Date of Birth/Sex: [birth year] (76  y.o. Female) Treating RN: Primary Care Provider: Elsie [name] Other Clinician: Referring Provider: Elsie [name] Treating Provider/Extender: [name] [name] in Treatment: 4 Information Obtained From Patient Wound History Do you currently have one or more open woundso Yes How many open wounds do you currently haveo 1 Approximately how long have you had your woundso 3 weeks How have you been treating your wound(s) until nowo triple abx ointment Has your wound(s) ever healed and then re-openedo No Have you had any lab work done in the past montho No Have you tested positive for an antibiotic resistant organism (MRSA, VRE)o No Have you tested positive for osteomyelitis (bone infection)o No Have you had any tests for circulation on your legso Yes Eyes Medical History: Negative for: Cataracts; Glaucoma; Optic Neuritis Ear/Nose/Mouth/Throat Medical History: Negative for: Chronic sinus problems/congestion; Middle ear problems Hematologic/Lymphatic Medical History: Negative for: Anemia; Hemophilia; Human Immunodeficiency Virus; Lymphedema; Sickle Cell Disease Respiratory Medical History: Negative for: Aspiration; Asthma; Chronic Obstructive Pulmonary Disease (COPD); Sleep Apnea; Tuberculosis Cardiovascular Medical History: Positive for: Hypertension - medication controlled Negative for: Angina; Arrhythmia; Congestive Heart Failure;  Coronary Artery Disease; Deep Vein Thrombosis; Hypotension; Myocardial Infarction; Peripheral Arterial Disease; Peripheral Venous Disease; Phlebitis; Vasculitis Past Medical History Notes: Right leg stripped veins (03/1998) Gastrointestinal Medical History: Negative for: Cirrhosis ; Colitis; Crohnos; Hepatitis A; Hepatitis B; Hepatitis C Endocrine Jutte, [place] [name]. ([id number]) Medical History: Negative for: Type I Diabetes; Type II Diabetes Genitourinary Medical History: Negative for: End Stage Renal Disease Immunological Medical  History: Negative for: Lupus Erythematosus; Raynaudos Integumentary (Skin) Medical History: Negative for: History of Burn; History of pressure wounds Musculoskeletal Medical History: Negative for: Gout; Rheumatoid Arthritis; Osteoarthritis; Osteomyelitis Neurologic Medical History: Negative for: Dementia; Neuropathy; Quadriplegia; Paraplegia; Seizure Disorder Oncologic Medical History: Negative for: Received Chemotherapy; Received Radiation Psychiatric Medical History: Negative for: Anorexia/bulimia; Confinement Anxiety Immunizations Pneumococcal Vaccine: Received Pneumococcal Vaccination: Yes Implantable Devices Family and Social History Cancer: Yes - Siblings; Diabetes: No; Heart Disease: Yes - Father; Hypertension: No; Kidney Disease: No; Lung Disease: No; Seizures: No; Stroke: Yes - Mother; Thyroid Problems: No; Tuberculosis: No; Never smoker; Marital Status - Married; Alcohol Use: Never; Drug Use: No History; Caffeine Use: Daily; Advanced Directives: No; Patient does not want information on Advanced Directives; Do not resuscitate: No; Living Will: No; Medical Power of Attorney: No Physician Affirmation I have reviewed and agree with the above information. Electronic Signature(s) Signed: 11/29/2016 4:30:10 PM By: [name] Fudge MD, FACS Entered By: [name] [name] on 11/29/2016 09:21:55 Wahlert, Jonise TMarland Kitchen ([id number]) -------------------------------------------------------------------------------- SuperBill Details Patient Name: [name] [name], Rakhi [name]. Date of Service: 11/29/2016 Medical Record Number: [id number] Patient Account Number: [id number] Date of Birth/Sex: 1940/04/29 (76 y.o. 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

## 2016-11-30 NOTE — Assessment & Plan Note (Signed)
lipid control important in reducing the progression of atherosclerotic disease. Continue statin therapy  

## 2016-12-02 NOTE — Progress Notes (Signed)
[name], [name] ([id number]) Visit Report for 11/29/2016 Arrival Information Details Patient Name: [name], [name]. Date of Service: 11/29/2016 8:30 AM Medical Record Number: [id number] Patient Account Number: [id number] Date of Birth/Sex: November 22, 1940 (76 y.o. Female) Treating RN: Ahmed Prima Primary Care Josecarlos Harriott: Elsie Stain Other Clinician: Referring Dacen Frayre: Elsie Stain Treating Caleen Taaffe/Extender: Frann Rider in Treatment: 4 Visit Information History Since Last Visit All ordered tests and consults were completed: No Patient Arrived: Ambulatory Added or deleted any medications: No Arrival Time: 08:34 Any new allergies or adverse reactions: No Accompanied By: husband Had a fall or experienced change in No Transfer Assistance: None activities of daily living that may affect Patient Identification Verified: Yes risk of falls: Secondary Verification Process Yes Signs or symptoms of abuse/neglect since last visito No Completed: Hospitalized since last visit: No Patient Requires Transmission-Based No Has Dressing in Place as Prescribed: Yes Precautions: Has Compression in Place as Prescribed: Yes Patient Has Alerts: Yes Pain Present Now: No Patient Alerts: Patient on Blood Thinner 81 MG Aspirin NOT DiaBETIC Electronic Signature(s) Signed: 11/29/2016 5:35:20 PM By: Alric Quan Entered By: Alric Quan on 11/29/2016 08:36:06 Salvador, Armenta T. ([id number]) -------------------------------------------------------------------------------- Clinic Level of Care Assessment Details Patient Name: [name], [name] Date of Service: 11/29/2016 8:30 AM Medical Record Number: [id number] Patient Account Number: [id number] Date of Birth/Sex: 1940-06-23 (76 y.o. Female) Treating RN: Carolyne Fiscal, Debi Primary Care Jersee Winiarski: Elsie Stain Other Clinician: Referring Dewaun Kinzler: Elsie Stain Treating Achille Xiang/Extender: Frann Rider in Treatment: 4 Clinic  Level of Care Assessment Items TOOL 4 Quantity Score X - Use when only an EandM is performed on FOLLOW-UP visit 1 0 ASSESSMENTS - Nursing Assessment / Reassessment X - Reassessment of Co-morbidities (includes updates in patient status) 1 10 X- 1 5 Reassessment of Adherence to Treatment Plan ASSESSMENTS - Wound and Skin Assessment / Reassessment X - Simple Wound Assessment / Reassessment - one wound 1 5 []  - 0 Complex Wound Assessment / Reassessment - multiple wounds []  - 0 Dermatologic / Skin Assessment (not related to wound area) ASSESSMENTS - Focused Assessment []  - Circumferential Edema Measurements - multi extremities 0 []  - 0 Nutritional Assessment / Counseling / Intervention []  - 0 Lower Extremity Assessment (monofilament, tuning fork, pulses) []  - 0 Peripheral Arterial Disease Assessment (using hand held doppler) ASSESSMENTS - Ostomy and/or Continence Assessment and Care []  - Incontinence Assessment and Management 0 []  - 0 Ostomy Care Assessment and Management (repouching, etc.) PROCESS - Coordination of Care X - Simple Patient / Family Education for ongoing care 1 15 []  - 0 Complex (extensive) Patient / Family Education for ongoing care []  - 0 Staff obtains Programmer, systems, Records, Test Results / Process Orders []  - 0 Staff telephones HHA, Nursing Homes / Clarify orders / etc []  - 0 Routine Transfer to another Facility (non-emergent condition) []  - 0 Routine Hospital Admission (non-emergent condition) []  - 0 New Admissions / Biomedical engineer / Ordering NPWT, Apligraf, etc. []  - 0 Emergency Hospital Admission (emergent condition) X- 1 10 Simple Discharge Coordination [place]oxham, Assyria T. ([id number]) []  - 0 Complex (extensive) Discharge Coordination PROCESS - Special Needs []  - Pediatric / Minor Patient Management 0 []  - 0 Isolation Patient Management []  - 0 Hearing / Language / Visual special needs []  - 0 Assessment of Community assistance (transportation,  D/C planning, etc.) []  - 0 Additional assistance / Altered mentation []  - 0 Support Surface(s) Assessment (bed, cushion, seat, etc.) INTERVENTIONS - Wound Cleansing / Measurement X - Simple Wound Cleansing - one wound 1  5 []  - 0 Complex Wound Cleansing - multiple wounds X- 1 5 Wound Imaging (photographs - any number of wounds) []  - 0 Wound Tracing (instead of photographs) X- 1 5 Simple Wound Measurement - one wound []  - 0 Complex Wound Measurement - multiple wounds INTERVENTIONS - Wound Dressings X - Small Wound Dressing one or multiple wounds 1 10 []  - 0 Medium Wound Dressing one or multiple wounds []  - 0 Large Wound Dressing one or multiple wounds X- 1 5 Application of Medications - topical []  - 0 Application of Medications - injection INTERVENTIONS - Miscellaneous []  - External ear exam 0 []  - 0 Specimen Collection (cultures, biopsies, blood, body fluids, etc.) []  - 0 Specimen(s) / Culture(s) sent or taken to Lab for analysis []  - 0 Patient Transfer (multiple staff / Civil Service fast streamer / Similar devices) []  - 0 Simple Staple / Suture removal (25 or less) []  - 0 Complex Staple / Suture removal (26 or more) []  - 0 Hypo / Hyperglycemic Management (close monitor of Blood Glucose) []  - 0 Ankle / Brachial Index (ABI) - do not check if billed separately X- 1 5 Vital Signs Mckibben, Graham T. ([id number]) Has the patient been seen at the hospital within the last three years: Yes Total Score: 80 Level Of Care: New/Established - Level 3 Electronic Signature(s) Signed: 12/01/2016 4:49:34 PM By: Alric Quan Previous Signature: 11/29/2016 5:35:20 PM Version By: Alric Quan Entered By: Alric Quan on 11/30/2016 09:45:44 Alter, Jimeka T. ([id number]) -------------------------------------------------------------------------------- Encounter Discharge Information Details Patient Name: [name], [name] Date of Service: 11/29/2016 8:30 AM Medical Record Number:  [id number] Patient Account Number: [id number] Date of Birth/Sex: 05/02/1940 (76 y.o. Female) Treating RN: Carolyne Fiscal, Debi Primary Care Lilyann Gravelle: Elsie Stain Other Clinician: Referring Yerlin Gasparyan: Elsie Stain Treating Edenilson Austad/Extender: Frann Rider in Treatment: 4 Encounter Discharge Information Items Discharge Pain Level: 0 Discharge Condition: Stable Ambulatory Status: Ambulatory Discharge Destination: Home Transportation: Private Auto Accompanied By: husband Schedule Follow-up Appointment: Yes Medication Reconciliation completed and No provided to Patient/Care Aiesha Leland: Provided on Clinical Summary of Care: 11/29/2016 Form Type Recipient Paper Patient [facility] Electronic Signature(s) Signed: 12/01/2016 11:35:39 AM By: Ruthine Dose Previous Signature: 11/29/2016 8:53:00 AM Version By: Alric Quan Entered By: Ruthine Dose on 11/29/2016 09:19:56 [name], [name] ([id number]) -------------------------------------------------------------------------------- Lower Extremity Assessment Details Patient Name: [name], [name] Date of Service: 11/29/2016 8:30 AM Medical Record Number: [id number] Patient Account Number: [id number] Date of Birth/Sex: 1940/10/27 (76 y.o. Female) Treating RN: Carolyne Fiscal, Debi Primary Care Yemaya Barnier: Elsie Stain Other Clinician: Referring Chellie Vanlue: Elsie Stain Treating Klare Criss/Extender: Frann Rider in Treatment: 4 Edema Assessment Assessed: [Left: No] [Right: No] [Left: Edema] [Right: :] Calf Left: Right: Point of Measurement: 32 cm From Medial Instep 39.5 cm 36.5 cm Ankle Left: Right: Point of Measurement: 10 cm From Medial Instep 22 cm 21 cm Vascular Assessment Pulses: Dorsalis Pedis Palpable: [Right:Yes] Posterior Tibial Extremity colors, hair growth, and conditions: Extremity Color: [Right:Mottled] Temperature of Extremity: [Right:Warm] Capillary Refill: [Right:< 3 seconds] Toe Nail Assessment Left:  Right: Thick: No Discolored: No Deformed: No Improper Length and Hygiene: No Notes heel to knee- 47cm Electronic Signature(s) Signed: 11/29/2016 5:35:20 PM By: Alric Quan Entered By: Alric Quan on 11/29/2016 09:09:21 [name], [name] ([id number]) -------------------------------------------------------------------------------- Multi Wound Chart Details Patient Name: [name], [name] Date of Service: 11/29/2016 8:30 AM Medical Record Number: [id number] Patient Account Number: [id number] Date of Birth/Sex: 1940/09/07 (76 y.o. 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] [N/A:N/A] Comorbid History: [1:Hypertension] [N/A:N/A] Date Acquired: [1:10/15/2016] [N/A:N/A] Weeks of Treatment: [1:4] [N/A:N/A] Wound Status: [1:Open] [N/A:N/A] Measurements L x W x D [1:0.1x0.1x0.1] [N/A:N/A] (cm) Area (cm) : [1:0.008] [N/A:N/A] Volume (cm) : [1:0.001] [N/A:N/A] % Reduction in Area: [1:99.10%] [N/A:N/A] % Reduction in Volume: [1:98.80%] [N/A:N/A] Classification: [1:Partial Thickness] [N/A:N/A] Exudate Amount: [1:Medium] [N/A:N/A] Exudate Type: [1:Serosanguineous] [N/A:N/A] Exudate Color: [1:red, brown] [N/A:N/A] Wound Margin: [1:Flat and Intact] [N/A:N/A] Granulation Amount: [1:None Present (0%)] [N/A:N/A] Necrotic Amount: [1:Large (67-100%)] [N/A:N/A] Exposed Structures: [1:Fat Layer (Subcutaneous Tissue) Exposed: Yes Fascia: No Tendon: No Muscle: No Joint: No Bone: No]  [N/A:N/A] Epithelialization: [1:None] [N/A:N/A] Periwound Skin Texture: [1:Excoriation: No Induration: No Callus: No Crepitus: No Rash: No Scarring: No] [N/A:N/A] Periwound Skin Moisture: [1:Maceration: No Dry/Scaly: No] [N/A:N/A] Periwound Skin Color: Ecchymosis: Yes N/A N/A Atrophie Blanche: No Cyanosis: No Erythema: No Hemosiderin Staining: No Mottled: No Pallor: No Rubor: No Tenderness on Palpation: No N/A N/A Wound Preparation: Ulcer Cleansing: N/A N/A Rinsed/Irrigated with Saline, Other: soap and water Topical Anesthetic Applied: Other: lidocaine 4% Treatment Notes Wound #1 (Right, Medial Ankle) 1. Cleansed with: Clean wound with Normal Saline 2. Anesthetic Topical Lidocaine 4% cream to wound bed prior to debridement 3. Peri-wound Care: Skin Prep 5. Secondary Dressing Applied Bordered Foam Dressing Dry Gauze Electronic Signature(s) Signed: 11/29/2016 9:21:34 AM By: Christin Fudge MD, FACS Entered By: Christin Fudge on 11/29/2016 09:21:34 [name] ([id number]) -------------------------------------------------------------------------------- [place] Details Patient Name: [name], [name] Date of Service: 11/29/2016 8:30 AM Medical Record Number: [id number] Patient Account Number: [id number] Date of Birth/Sex: 10/20/1940 (76 y.o. Female) Treating RN: Carolyne Fiscal, Debi Primary Care Dawnn Nam: Elsie Stain Other Clinician: Referring Araseli Sherry: Elsie Stain Treating Ericka Marcellus/Extender: Frann Rider in Treatment: 4 Active Inactive ` Abuse / Safety / Falls / Self Care Management Nursing Diagnoses: Potential for falls Goals: Patient will not experience any injury related to falls Date Initiated: 10/29/2016 Target Resolution Date: 11/15/2016 Goal Status: Active Interventions: Podiatry chair, stretcher in low position and side rails up as needed Notes: ` Orientation to the Wound Care Program Nursing Diagnoses: Knowledge  deficit related to the wound healing center program Goals: Patient/caregiver will verbalize understanding of the [place] Date Initiated: 10/29/2016 Target Resolution Date: 11/15/2016 Goal Status: Active Interventions: Provide education on orientation to the wound center Notes: ` Venous Leg Ulcer Nursing Diagnoses: Potential for venous Insuffiency (use before diagnosis confirmed) Goals: Patient will maintain optimal edema control Date Initiated: 10/29/2016 Target Resolution Date: 11/15/2016 Goal Status: Active Interventions: Esh, Tamiko T. ([id number]) Assess peripheral edema status every visit. Treatment Activities: Non-invasive vascular studies : 10/29/2016 Notes: ` Wound/Skin Impairment Nursing Diagnoses: Impaired tissue integrity Goals: Ulcer/skin breakdown will heal within 14 weeks Date Initiated: 10/29/2016 Target Resolution Date: 02/14/2017 Goal Status: Active Interventions: Assess ulceration(s) every visit Treatment Activities: Referred to DME Neelie Welshans for dressing supplies : 10/29/2016 Notes: Electronic Signature(s) Signed: 11/29/2016 5:35:20 PM By: Alric Quan Entered By: Alric Quan on 11/29/2016 08:47:20 [name], [name] ([id number]) -------------------------------------------------------------------------------- Pain Assessment Details Patient Name: [name], Deanndra T. Date of Service: 11/29/2016 8:30 AM Medical Record Number: [id number] Patient Account Number: [id number] Date of Birth/Sex: 07/05/1940 (76 y.o. Female) Treating RN: Carolyne Fiscal, Debi Primary Care Chandelle Harkey: Elsie Stain Other Clinician: Referring Jamarri Vuncannon: Elsie Stain  Treating Jerry Clyne/Extender: Frann Rider in Treatment: 4 Active Problems Location of Pain Severity and Description of Pain Patient Has Paino No Site Locations Pain Management and Medication Current Pain Management: Electronic Signature(s) Signed: 11/29/2016 5:35:20 PM By:  Alric Quan Entered By: Alric Quan on 11/29/2016 08:36:12 [name], [name] ([id number]) -------------------------------------------------------------------------------- Patient/Caregiver Education Details Patient Name: [name], Daly T. Date of Service: 11/29/2016 8:30 AM Medical Record Number: [id number] Patient Account Number: [id number] Date of Birth/Gender: 04/16/1940 (76 y.o. Female) Treating RN: Ahmed Prima Primary Care Physician: Elsie Stain Other Clinician: Referring Physician: Elsie Stain Treating Physician/Extender: Frann Rider in Treatment: 4 Education Assessment Education Provided To: Patient Education Topics Provided Wound/Skin Impairment: Handouts: Other: change dressing as ordered Methods: Demonstration, Explain/Verbal Responses: State content correctly Electronic Signature(s) Signed: 11/29/2016 5:35:20 PM By: Alric Quan Entered By: Alric Quan on 11/29/2016 08:53:19 [name], [name] ([id number]) -------------------------------------------------------------------------------- Wound Assessment Details Patient Name: [name], [name] Date of Service: 11/29/2016 8:30 AM Medical Record Number: [id number] Patient Account Number: [id number] Date of Birth/Sex: March 01, 1940 (76 y.o. Female) Treating RN: Carolyne Fiscal, Debi Primary Care Kaoru Benda: Elsie Stain Other Clinician: Referring Mycah Mcdougall: Elsie Stain Treating Kerin Cecchi/Extender: Frann Rider in Treatment: 4 Wound Status Wound Number: 1 Primary Etiology: Vasculopathy Wound Location: Right Ankle - Medial Wound Status: Open Wounding Event: Gradually Appeared Comorbid History: Hypertension Date Acquired: 10/15/2016 Weeks Of Treatment: 4 Clustered Wound: No Photos Photo Uploaded By: Alric Quan on 11/30/2016 08:11:07 Wound Measurements Length: (cm) 0.1 Width: (cm) 0.1 Depth: (cm) 0.1 Area: (cm) 0.008 Volume: (cm) 0.001 % Reduction in Area: 99.1% %  Reduction in Volume: 98.8% Epithelialization: None Tunneling: No Undermining: No Wound Description Classification: Partial Thickness Wound Margin: Flat and Intact Exudate Amount: Medium Exudate Type: Serosanguineous Exudate Color: red, brown Foul Odor After Cleansing: No Slough/Fibrino Yes Wound Bed Granulation Amount: None Present (0%) Exposed Structure Necrotic Amount: Large (67-100%) Fascia Exposed: No Necrotic Quality: Adherent Slough Fat Layer (Subcutaneous Tissue) Exposed: Yes Tendon Exposed: No Muscle Exposed: No Joint Exposed: No Bone Exposed: No Periwound Skin Texture Skirvin, Quinci T. ([id number]) Texture Color No Abnormalities Noted: No No Abnormalities Noted: No Callus: No Atrophie Blanche: No Crepitus: No Cyanosis: No Excoriation: No Ecchymosis: Yes Induration: No Erythema: No Rash: No Hemosiderin Staining: No Scarring: No Mottled: No Pallor: No Moisture Rubor: No No Abnormalities Noted: No Dry / Scaly: No Maceration: No Wound Preparation Ulcer Cleansing: Rinsed/Irrigated with Saline, Other: soap and water, Topical Anesthetic Applied: Other: lidocaine 4%, Treatment Notes Wound #1 (Right, Medial Ankle) 1. Cleansed with: Clean wound with Normal Saline 2. Anesthetic Topical Lidocaine 4% cream to wound bed prior to debridement 3. Peri-wound Care: Skin Prep 5. Secondary Dressing Applied Bordered Foam Dressing Dry Gauze Electronic Signature(s) Signed: 11/29/2016 5:35:20 PM By: Alric Quan Entered By: Alric Quan on 11/29/2016 08:46:47 [name], [name] ([id number]) -------------------------------------------------------------------------------- [place] Details Patient Name: [name], Beula T. Date of Service: 11/29/2016 8:30 AM Medical Record Number: [id number] Patient Account Number: [id number] Date of Birth/Sex: 04/22/1940 (76 y.o. Female) Treating RN: Carolyne Fiscal, Debi Primary Care Erhardt Dada: Elsie Stain Other  Clinician: Referring Ovide Dusek: Elsie Stain Treating Pamla Pangle/Extender: Frann Rider in Treatment: 4 Vital Signs Time Taken: 08:36 Temperature (F): 97.6 Height (in): 68 Pulse (bpm): 68 Weight (lbs): 191.1 Respiratory Rate (breaths/min): 18 Body Mass Index (BMI): 29.1 Blood Pressure (mmHg): 149/56 Reference Range: 80 - 120 mg / dl Electronic Signature(s) Signed: 11/29/2016 5:35:20 PM By: Alric Quan Entered By: Alric Quan on 11/29/2016 08:38:49

## 2016-12-13 ENCOUNTER — Encounter: Payer: Medicare Other | Admitting: Surgery

## 2016-12-13 DIAGNOSIS — I872 Venous insufficiency (chronic) (peripheral): Secondary | ICD-10-CM | POA: Diagnosis not present

## 2016-12-13 DIAGNOSIS — L97312 Non-pressure chronic ulcer of right ankle with fat layer exposed: Secondary | ICD-10-CM | POA: Diagnosis not present

## 2016-12-13 DIAGNOSIS — I10 Essential (primary) hypertension: Secondary | ICD-10-CM | POA: Diagnosis not present

## 2016-12-13 DIAGNOSIS — L97319 Non-pressure chronic ulcer of right ankle with unspecified severity: Secondary | ICD-10-CM | POA: Diagnosis not present

## 2016-12-13 DIAGNOSIS — I83013 Varicose veins of right lower extremity with ulcer of ankle: Secondary | ICD-10-CM | POA: Diagnosis not present

## 2016-12-13 NOTE — Progress Notes (Addendum)
[name] ([id number]) Visit Report for 12/13/2016 Chief Complaint Document Details Patient Name: Kim [name]. Date of Service: 12/13/2016 8:15 AM Medical Record Number: [id number] Patient Account Number: [id number] Date of Birth/Sex: 10/22/1940 (76 y.o. Female) Treating RN: [name] Primary Care Provider: Elsie [name] Other Clinician: Referring Provider: Elsie [name] Treating Provider/Extender: [name] in Treatment: 6 Information Obtained from: Patient Chief Complaint Patient presents for treatment of an open ulcer due to venous insufficiency Situated at her right medial ankle for about 3 weeks Electronic Signature(s) Signed: 12/13/2016 8:53:36 AM By: Kim Fudge MD, FACS Entered By: [name] on 12/13/2016 08:53:35 [name]. ([id number]) -------------------------------------------------------------------------------- HPI Details Patient Name: [name]. Date of Service: 12/13/2016 8:15 AM Medical Record Number: [id number] Patient Account Number: [id number] Date of Birth/Sex: [birth year] (76 y.o. Female) Treating RN: [name] Primary Care Provider: Elsie [name] Other Clinician: Referring Provider: Elsie [name] Treating Provider/Extender: [name] in Treatment: 6 History of Present Illness Location: Patient presents with an ulcer on the right medial ankle. Quality: Patient reports experiencing a dull [name] to affected area(s). Severity: Patient states wound are getting worse. Duration: Patient has had the wound for < 4 weeks prior to presenting for treatment Timing: [name] in wound is Intermittent (comes and goes Context: The wound would happen gradually Modifying Factors: Other treatment(s) tried include:local compression stockings recently Associated Signs and Symptoms: Patient reports having increase swelling. HPI Description: 76 year old patient was recently seen by her PCP [name] and he  noted erythema and ulceration and started her on doxycycline. He noted a intact dorsalis pedis pulse.the medial right ankle had skin ulceration which had been treated by Keflex recently and notices made that she had had wound care treatments earlier. past medical history significant for back [name], endometriosis, high cholesterol, hypothyroidism, migraine and venous stasis ulcer treated at [facility]. she has never been a smoker the patient gives a history of right varicose vein stripping done in the year 2000 at [facility]. She has not been wearing her compression since. 11/05/2016 -- the venous duplex study has been done for reflux and we are awaiting her arterial study to be done. The patient is of a very nervous disposition and seems to be very anxious. 11/15/2016 -- venous duplex reflux study -- venous incompetence noted in the right saphenofemoral junction and the right great saphenous vein on the right lower extremity. The right great saphenous vein was harvested as mentioned above. Arterial study was done on 11/12/2016 but the results are not yet available. I will refer her to see the vascular surgeons for an opinion. 11/22/2016 -- lower extremity arterial duplex examination done showed no hemodynamically significant stenosis in bilateral arterial flow examination. As far as a venous examination goes she had declined to get her left leg studied and the right leg is also noted on 11/15/2016. On asking the patient about this she says she never told the technician not to do her left leg but it may have been a misunderstanding. The patient will be seeing the vascular surgeons soon and I have asked them to address whether the left leg needs a venous duplex study. 12/13/2016 -- was seen by Dr. Leotis [name] on 11/30/2016, who reviewed her recent venous duplex study,a noninvasive arterial study, and after a thorough review has recommended to continue with compression stockings daily and if the  venous ulceration recurs then he would consider her for form sclerotherapy of the incompetent accessory saphenous vein. Electronic Signature(s) Signed: 12/13/2016 8:53:51  AM By: Kim Fudge MD, FACS Previous Signature: 12/13/2016 8:45:58 AM Version By: Kim Fudge MD, FACS Previous Signature: 12/13/2016 8:17:57 AM Version By: Kim Fudge MD, FACS Entered By: [name] on 12/13/2016 08:53:51 [place], [name] ([id number]) -------------------------------------------------------------------------------- Physical Exam Details Patient Name: [name]. Date of Service: 12/13/2016 8:15 AM Medical Record Number: [id number] Patient Account Number: [id number] Date of Birth/Sex: [birth year] (76 y.o. Female) Treating RN: [name] Primary Care Provider: Elsie [name] Other Clinician: Referring Provider: Elsie [name] Treating Provider/Extender: [name] in Treatment: 6 Constitutional . Pulse regular. Respirations normal and unlabored. Afebrile. . Eyes Nonicteric. Reactive to light. Ears, Nose, Mouth, and Throat Lips, teeth, and gums WNL.[name] Moist mucosa without lesions. Neck supple and nontender. No palpable supraclavicular or cervical adenopathy. Normal sized without goiter. Respiratory WNL. No retractions.. Cardiovascular Pedal Pulses WNL. No clubbing, cyanosis or edema. Lymphatic No adneopathy. No adenopathy. No adenopathy. Musculoskeletal Adexa without tenderness or enlargement.. Digits and nails w/o clubbing, cyanosis, infection, petechiae, ischemia, or inflammatory conditions.. Integumentary (Hair, Skin) No suspicious lesions. No crepitus or fluctuance. No peri-wound warmth or erythema. No masses.[name] Psychiatric Judgement and insight Intact.. No evidence of depression, anxiety, or agitation.. Notes the wound is healed and there is a minimal dry eschar which needs moisturizing. The patient did not have her compression stockings on today. Electronic  Signature(s) Signed: 12/13/2016 8:54:29 AM By: Kim Fudge MD, FACS Entered By: [name] on 12/13/2016 08:54:29 [name] ([id number]) -------------------------------------------------------------------------------- Physician Orders Details Patient Name: Truddie [name]. Date of Service: 12/13/2016 8:15 AM Medical Record Number: [id number] Patient Account Number: [id number] Date of Birth/Sex: 28-Aug-1940 (76 y.o. Female) Treating RN: [name] Primary Care Provider: Elsie [name] Other Clinician: Referring Provider: Elsie [name] Treating Provider/Extender: [name] in Treatment: 6 Verbal / Phone Orders: No Diagnosis Coding Discharge From [facility] Services o Discharge from [facility] - treatment completed Electronic Signature(s) Signed: 12/13/2016 12:10:21 PM By: Kim Fudge MD, FACS Signed: 12/14/2016 4:40:58 PM By: [name] Entered By: [name] on 12/13/2016 08:44:24 [place], [name] ([id number]) -------------------------------------------------------------------------------- Problem List Details Patient Name: Voong, Karle [name]. Date of Service: 12/13/2016 8:15 AM Medical Record Number: [id number] Patient Account Number: [id number] Date of Birth/Sex: 1940-07-13 (76 y.o. 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  ([id number]) -------------------------------------------------------------------------------- Progress Note Details Patient Name: [name]. Date of Service: 12/13/2016 8:15 AM Medical Record Number: [id number] Patient Account Number: [id number] Date of Birth/Sex: Oct 26, 1940 (76 y.o. Female) Treating RN: [name] Primary Care Provider: Elsie [name] Other Clinician: Referring Provider: Elsie [name] Treating Provider/Extender: [name] in Treatment: 6 Subjective Chief Complaint Information obtained from Patient Patient presents for treatment of an open ulcer due to venous insufficiency Situated at her right medial ankle for about 3 weeks History of Present Illness (HPI) The following HPI elements were documented for the patient's wound: Location: Patient presents with an ulcer on the right medial ankle. Quality: Patient reports experiencing a dull [name] to affected area(s). Severity: Patient states wound are getting worse. Duration: Patient has had the wound for <  4 weeks prior to presenting for treatment Timing: [name] in wound is Intermittent (comes and goes Context: The wound would happen gradually Modifying Factors: Other treatment(s) tried include:local compression stockings recently Associated Signs and Symptoms: Patient reports having increase swelling. 76 year old patient was recently seen by her PCP [name] and he noted erythema and ulceration and started her on doxycycline. He noted a intact dorsalis pedis pulse.the medial right ankle had skin ulceration which had been treated by Keflex recently and notices made that she had had wound care treatments earlier. past medical history significant for back [name], endometriosis, high cholesterol, hypothyroidism, migraine and venous stasis ulcer treated at [facility] she has never been a smoker the patient gives a history of right varicose vein stripping done in the year 2000 at [facility]. She has  not been wearing her compression since. 11/05/2016 -- the venous duplex study has been done for reflux and we are awaiting her arterial study to be done. The patient is of a very nervous disposition and seems to be very anxious. 11/15/2016 -- venous duplex reflux study -- venous incompetence noted in the right saphenofemoral junction and the right great saphenous vein on the right lower extremity. The right great saphenous vein was harvested as mentioned above. Arterial study was done on 11/12/2016 but the results are not yet available. I will refer her to see the vascular surgeons for an opinion. 11/22/2016 -- lower extremity arterial duplex examination done showed no hemodynamically significant stenosis in bilateral arterial flow examination. As far as a venous examination goes she had declined to get her left leg studied and the right leg is also noted on 11/15/2016. On asking the patient about this she says she never told the technician not to do her left leg but it may have been a misunderstanding. The patient will be seeing the vascular surgeons soon and I have asked them to address whether the left leg needs a venous duplex study. 12/13/2016 -- was seen by Dr. Leotis [name] on 11/30/2016, who reviewed her recent venous duplex study,a noninvasive arterial study, and after a thorough review has recommended to continue with compression stockings daily and if the venous ulceration recurs then he would consider her for form sclerotherapy of the incompetent accessory saphenous vein. Patient History Kim [name] ([id number]) Information obtained from Patient. Family History Cancer - Siblings, Heart Disease - Father, Stroke - Mother, No family history of Diabetes, Hypertension, Kidney Disease, Lung Disease, Seizures, Thyroid Problems, Tuberculosis. Social History Never smoker, Marital Status - Married, Alcohol Use - Never, Drug Use - No History, Caffeine Use - Daily. Medical And Surgical  History Notes Cardiovascular Right leg stripped veins (03/1998) Objective Constitutional Pulse regular. Respirations normal and unlabored. Afebrile. Vitals Time Taken: 8:18 AM, Height: 68 in, Weight: 191.1 lbs, BMI: 29.1, Temperature: 97.9 F, Pulse: 63 bpm, Respiratory Rate: 18 breaths/min, Blood Pressure: 125/59 mmHg. Eyes Nonicteric. Reactive to light. Ears, Nose, Mouth, and Throat Lips, teeth, and gums WNL.[name] Moist mucosa without lesions. Neck supple and nontender. No palpable supraclavicular or cervical adenopathy. Normal sized without goiter. Respiratory WNL. No retractions.. Cardiovascular Pedal Pulses WNL. No clubbing, cyanosis or edema. Lymphatic No adneopathy. No adenopathy. No adenopathy. Musculoskeletal Adexa without tenderness or enlargement.. Digits and nails w/o clubbing, cyanosis, infection, petechiae, ischemia, or inflammatory conditions.[name] Psychiatric Judgement and insight Intact.. No evidence of depression, anxiety, or agitation.. General Notes: the wound is healed and there is a minimal dry eschar which needs moisturizing. The patient did not have her [place],  Avah [name]. ([id number]) compression stockings on today. Integumentary (Hair, Skin) No suspicious lesions. No crepitus or fluctuance. No peri-wound warmth or erythema. No masses.. Wound #1 status is Healed - Epithelialized. Original cause of wound was Gradually Appeared. The wound is located on the Right,Medial Ankle. The wound measures 0cm length x 0cm width x 0cm depth; 0cm^2 area and 0cm^3 volume. 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 I have discussed the management at length and the problems of recurrence of ulceration if she is not compliant with the compression. She has been discharged from the wound care services and was seen back only if need arises Electronic Signature(s) Signed: 12/13/2016 8:55:16 AM By: Kim Fudge MD, FACS Entered By: [name] on 12/13/2016 08:55:16 [place], [name] ([id number]) -------------------------------------------------------------------------------- ROS/PFSH Details Patient Name: Truddie [name]. Date of Service: 12/13/2016 8:15 AM Medical Record Number: [id number] Patient Account Number: [id number] Date of Birth/Sex: Jul 30, 1940 (76 y.o. Female) Treating RN: [name] Primary Care Provider: Elsie [name] Other Clinician: Referring Provider: Elsie [name] Treating Provider/Extender: [name] in Treatment: 6 Information Obtained From Patient Wound History Do you currently have one or more open woundso Yes How many open wounds do you currently haveo 1 Approximately how long have you had your woundso 3 weeks How have you been treating your wound(s) until nowo triple abx ointment Has your wound(s) ever healed and then re-openedo No Have you had any lab work done in the past montho No Have you tested positive for an antibiotic resistant organism (MRSA, VRE)o No Have you tested positive for osteomyelitis (bone infection)o No Have you had any tests for circulation on your legso Yes Eyes Medical History: Negative for: Cataracts; Glaucoma; Optic Neuritis Ear/Nose/Mouth/Throat Medical History: Negative for: Chronic sinus problems/congestion; Middle ear problems Hematologic/Lymphatic Medical History: Negative for: Anemia; Hemophilia; Human Immunodeficiency Virus; Lymphedema; Sickle Cell Disease Respiratory Medical History: Negative for: Aspiration; Asthma; Chronic Obstructive Pulmonary Disease  (COPD); Sleep Apnea; Tuberculosis Cardiovascular Medical History: Positive for: Hypertension - medication controlled Negative for: Angina; Arrhythmia; Congestive Heart Failure; Coronary Artery Disease; Deep Vein Thrombosis; Hypotension; Myocardial Infarction; Peripheral Arterial Disease; Peripheral Venous Disease; Phlebitis; Vasculitis Past Medical History Notes: Right leg stripped veins (03/1998) Gastrointestinal Medical History: Negative for: Cirrhosis ; Colitis; Crohnos; Hepatitis A; Hepatitis B; Hepatitis C Endocrine [name]. ([id number]) Medical History: Negative for: Type I Diabetes; Type II Diabetes Genitourinary Medical History: Negative for: End Stage Renal Disease Immunological Medical History: Negative for: Lupus Erythematosus; Raynaudos Integumentary (Skin) Medical History: Negative for: History of Burn; History of pressure wounds Musculoskeletal Medical History: Negative for: Gout; Rheumatoid Arthritis; Osteoarthritis; Osteomyelitis Neurologic Medical History: Negative for: Dementia; Neuropathy; Quadriplegia; Paraplegia; Seizure Disorder Oncologic Medical History: Negative for: Received Chemotherapy; Received Radiation Psychiatric Medical History: Negative for: Anorexia/bulimia; Confinement Anxiety Immunizations Pneumococcal Vaccine: Received Pneumococcal Vaccination: Yes Implantable Devices Family and Social History Cancer: Yes - Siblings; Diabetes: No; Heart Disease: Yes - Father; Hypertension: No; Kidney Disease: No; Lung Disease: No; Seizures: No; Stroke: Yes - Mother; Thyroid Problems: No; Tuberculosis: No; Never smoker; Marital Status - Married; Alcohol Use: Never; Drug Use: No History; Caffeine Use: Daily; Advanced Directives:  No; Patient does not want information on Advanced Directives; Do not resuscitate: No; Living Will: No; Medical Power of Attorney: No Physician Affirmation I have reviewed and agree with the above information. Electronic  Signature(s) Signed: 12/13/2016 12:10:21 PM By: Kim Fudge MD, FACS Signed: 12/14/2016 4:40:58 PM By: [name] Entered By: [name] on 12/13/2016 08:54:00 [place], Nita [name]. ([id number]) Blau, Mry [name] Kitchen ([id number]) -------------------------------------------------------------------------------- SuperBill Details Patient Name: Bungert, Eureka [name]. Date of Service: 12/13/2016 Medical Record Number: [id number] Patient Account Number: [id number] Date of Birth/Sex: 1940-12-19 (76 y.o. 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

## 2016-12-15 NOTE — Progress Notes (Signed)
Kim, [name] ([id number]) Visit Report for 12/13/2016 Arrival Information Details Patient Name: [name], [name]. Date of Service: 12/13/2016 8:15 AM Medical Record Number: [id number] Patient Account Number: [id number] Date of Birth/Sex: Apr 20, 1940 (76 y.o. Female) Treating RN: [name] Primary Care [name]: [name] Other Clinician: Referring [name]: [name] Treating [name]/Extender: [name] in Treatment: 6 Visit Information History Since Last Visit All ordered tests and consults were completed: No Patient Arrived: Ambulatory Added or deleted any medications: No Arrival Time: 08:13 Any new allergies or adverse reactions: No Accompanied By: husband Had a fall or experienced change in No Transfer Assistance: None activities of daily living that may affect Patient Requires Transmission-Based No risk of falls: Precautions: Signs or symptoms of abuse/neglect since last visito No Patient Has Alerts: Yes Hospitalized since last visit: No Patient Alerts: Patient on Blood Pain Present Now: No Thinner 81 MG Aspirin NOT DiaBETIC Electronic Signature(s) Signed: 12/14/2016 4:40:58 PM By: [name] Entered By: [name] on 12/13/2016 08:13:26 [name], Kim TMarland Kitchen ([id number]) -------------------------------------------------------------------------------- Clinic Level of Care Assessment Details Patient Name: [name], [name]. Date of Service: 12/13/2016 8:15 AM Medical Record Number: [id number] Patient Account Number: [id number] Date of Birth/Sex: 08-28-1940 (76 y.o. Female) Treating RN: [name] Primary Care Greysen Swanton: [name] Other Clinician: Referring [name]: [name] Treating Dareon Nunziato/Extender: [name] in Treatment: 6 Clinic Level of Care Assessment Items TOOL 4 Quantity Score []  - Use when only an EandM is performed on FOLLOW-UP visit 0 ASSESSMENTS - Nursing Assessment / Reassessment []  -  Reassessment of Co-morbidities (includes updates in patient status) 0 X- 1 5 Reassessment of Adherence to Treatment Plan ASSESSMENTS - Wound and Skin Assessment / Reassessment []  - Simple Wound Assessment / Reassessment - one wound 0 []  - 0 Complex Wound Assessment / Reassessment - multiple wounds []  - 0 Dermatologic / Skin Assessment (not related to wound area) ASSESSMENTS - Focused Assessment []  - Circumferential Edema Measurements - multi extremities 0 []  - 0 Nutritional Assessment / Counseling / Intervention []  - 0 Lower Extremity Assessment (monofilament, tuning fork, pulses) []  - 0 Peripheral Arterial Disease Assessment (using hand held doppler) ASSESSMENTS - Ostomy and/or Continence Assessment and Care []  - Incontinence Assessment and Management 0 []  - 0 Ostomy Care Assessment and Management (repouching, etc.) PROCESS - Coordination of Care X - Simple Patient / Family Education for ongoing care 1 15 []  - 0 Complex (extensive) Patient / Family Education for ongoing care []  - 0 Staff obtains Programmer, systems, Records, Test Results / Process Orders []  - 0 Staff telephones HHA, Nursing Homes / Clarify orders / etc []  - 0 Routine Transfer to another Facility (non-emergent condition) []  - 0 Routine Hospital Admission (non-emergent condition) []  - 0 New Admissions / Biomedical engineer / Ordering NPWT, Apligraf, etc. []  - 0 Emergency Hospital Admission (emergent condition) X- 1 10 Simple Discharge Coordination [name], [name]. ([id number]) []  - 0 Complex (extensive) Discharge Coordination PROCESS - Special Needs []  - Pediatric / Minor Patient Management 0 []  - 0 Isolation Patient Management []  - 0 Hearing / Language / Visual special needs []  - 0 Assessment of Community assistance (transportation, D/C planning, etc.) []  - 0 Additional assistance / Altered mentation []  - 0 Support Surface(s) Assessment (bed, cushion, seat, etc.) INTERVENTIONS - Wound Cleansing /  Measurement []  - Simple Wound Cleansing - one wound 0 []  - 0 Complex Wound Cleansing - multiple wounds []  - 0 Wound Imaging (photographs - any number of wounds) []  - 0 Wound Tracing (instead  of photographs) []  - 0 Simple Wound Measurement - one wound []  - 0 Complex Wound Measurement - multiple wounds INTERVENTIONS - Wound Dressings []  - Small Wound Dressing one or multiple wounds 0 []  - 0 Medium Wound Dressing one or multiple wounds []  - 0 Large Wound Dressing one or multiple wounds []  - 0 Application of Medications - topical []  - 0 Application of Medications - injection INTERVENTIONS - Miscellaneous []  - External ear exam 0 []  - 0 Specimen Collection (cultures, biopsies, blood, body fluids, etc.) []  - 0 Specimen(s) / Culture(s) sent or taken to Lab for analysis []  - 0 Patient Transfer (multiple staff / Civil Service fast streamer / Similar devices) []  - 0 Simple Staple / Suture removal (25 or less) []  - 0 Complex Staple / Suture removal (26 or more) []  - 0 Hypo / Hyperglycemic Management (close monitor of Blood Glucose) []  - 0 Ankle / Brachial Index (ABI) - do not check if billed separately X- 1 5 Vital Signs [name], [name]. ([id number]) Has the patient been seen at the hospital within the last three years: Yes Total Score: 35 Level Of Care: New/Established - Level 1 Electronic Signature(s) Signed: 12/14/2016 4:40:58 PM By: [name] Entered By: [name] on 12/13/2016 08:46:46 [name], [name]. ([id number]) -------------------------------------------------------------------------------- Encounter Discharge Information Details Patient Name: [name], [name]. Date of Service: 12/13/2016 8:15 AM Medical Record Number: [id number] Patient Account Number: [id number] Date of Birth/Sex: 1940-11-22 (76 y.o. Female) Treating RN: [name] Primary Care Avri Paiva: [name] Other Clinician: Referring Story Vanvranken: [name] Treating Eudell Mcphee/Extender: [name] in Treatment: 6 Encounter Discharge Information Items Discharge Pain Level: 0 Discharge Condition: Stable Ambulatory Status: Ambulatory Discharge Destination: Home Private Transportation: Auto Accompanied By: husband Schedule Follow-up Appointment: No Medication Reconciliation completed and provided No to Patient/Care Shantell Belongia: Clinical Summary of Care: Electronic Signature(s) Signed: 12/14/2016 4:40:58 PM By: [name] Entered By: [name] on 12/13/2016 08:45:00 Vidaurri, Solina [name]. ([id number]) -------------------------------------------------------------------------------- Lower Extremity Assessment Details Patient Name: [name], [name]. Date of Service: 12/13/2016 8:15 AM Medical Record Number: [id number] Patient Account Number: [id number] Date of Birth/Sex: 1940-12-21 (76 y.o. Female) Treating RN: [name] Primary Care Eleah Lahaie: [name] Other Clinician: Referring Dashanae Longfield: [name] Treating Sulema Braid/Extender: [name] in Treatment: 6 Edema Assessment Assessed: [Left: No] [Right: No] Edema: [Left: N] [Right: o] Vascular Assessment Claudication: Claudication Assessment [Right:None] Pulses: Dorsalis Pedis Palpable: [Right:Yes] Posterior Tibial Extremity colors, hair growth, and conditions: Extremity Color: [Right:Mottled] Hair Growth on Extremity: [Right:No] Capillary Refill: [Right:< 3 seconds] Toe Nail Assessment Left: Right: Thick: No Discolored: No Deformed: No Improper Length and Hygiene: No Electronic Signature(s) Signed: 12/14/2016 4:40:58 PM By: [name] Entered By: [name] on 12/13/2016 08:27:13 Koltz, Michelina [name]. ([id number]) -------------------------------------------------------------------------------- Multi Wound Chart Details Patient Name: [name], [name]. Date of Service: 12/13/2016 8:15 AM Medical Record Number: [id number] Patient Account Number: [id number] Date of  Birth/Sex: [birth year] (76 y.o. 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] [N/A:N/A] Date Acquired: [1:10/15/2016] [N/A:N/A] Weeks of Treatment: [1:6] [N/A:N/A] Wound Status: [1:Healed - Epithelialized] [N/A:N/A] Measurements L x W x D [1:0x0x0] [N/A:N/A] (cm) Area (cm) : [1:0] [N/A:N/A] Volume (cm) : [1:0] [N/A:N/A] % Reduction in Area: [  1:100.00%] [N/A:N/A] % Reduction in Volume: [1:100.00%] [N/A:N/A] Classification: [1:Partial Thickness] [N/A:N/A] Periwound Skin Texture: [1:No Abnormalities Noted] [N/A:N/A] Periwound Skin Moisture: [1:No Abnormalities Noted] [N/A:N/A] Periwound Skin Color: [1:No Abnormalities Noted No] [N/A:N/A N/A] Treatment Notes Electronic Signature(s) Signed: 12/13/2016 8:53:29 AM By: Christin Fudge MD, FACS Entered By: Christin Fudge on 12/13/2016 08:53:29 Supak, Lamont Darene [name] ([id number]) -------------------------------------------------------------------------------- [place] Details Patient Name: [name], Shanavia [name]. Date of Service: 12/13/2016 8:15 AM Medical Record Number: [id number] Patient Account Number: [id number] Date of Birth/Sex: 09/23/1940 (76 y.o. Female) Treating RN: [name] Primary Care Taesean Reth: [name] Other Clinician: Referring Romello Hoehn: [name] Treating Jerred Zaremba/Extender: [name] in Treatment: 6 Active Inactive Electronic Signature(s) Signed: 12/14/2016 4:40:58 PM By: [name] Entered By:  [name] on 12/13/2016 08:45:55 [name], [name]. ([id number]) -------------------------------------------------------------------------------- Pain Assessment Details Patient Name: [name], [name]. Date of Service: 12/13/2016 8:15 AM Medical Record Number: [id number] Patient Account Number: [id number] Date of Birth/Sex: 03/10/1940 (76 y.o. Female) Treating RN: [name] Primary Care Ladaja Yusupov: [name] Other Clinician: Referring Corey Laski: [name] Treating Liela Rylee/Extender: [name] in Treatment: 6 Active Problems Location of Pain Severity and Description of Pain Patient Has Paino No Site Locations Pain Management and Medication Current Pain Management: Electronic Signature(s) Signed: 12/14/2016 4:40:58 PM By: [name] Entered By: [name] on 12/13/2016 08:13:35 [name], [name] ([id number]) -------------------------------------------------------------------------------- Patient/Caregiver Education Details Patient Name: [name], Ziomara [name]. Date of Service: 12/13/2016 8:15 AM Medical Record Number: [id number] Patient Account Number: [id number] Date of Birth/Gender: November 08, 1940 (76 y.o. Female) Treating RN: [name] Primary Care Physician: [name] Other Clinician: Referring Physician: Elsie [name] Treating Physician/Extender: [name] in Treatment: 6 Education Assessment Education Provided To: Patient and Caregiver Education Topics Provided Notes reviewed with patient and husband importance of wearing compression stockings Electronic Signature(s) Signed: 12/14/2016 4:40:58 PM By: [name] Entered By: [name] on 12/13/2016 08:45:32 [name], Tennile [name]. ([id number]) -------------------------------------------------------------------------------- Wound Assessment Details Patient Name: [name], [name]. Date of Service: 12/13/2016 8:15 AM Medical Record Number: [id number] Patient  Account Number: [id number] Date of Birth/Sex: 10/30/1940 (76 y.o. Female) Treating RN: [name] Primary Care Daiki Dicostanzo: [name] Other Clinician: Referring Cache Bills: [name] Treating Rhylei Mcquaig/Extender: [name] in Treatment: 6 Wound Status Wound Number: 1 Primary Etiology: Vasculopathy Wound Location: Right, Medial Ankle Wound Status: Healed - Epithelialized Wounding Event: Gradually Appeared Date Acquired: 10/15/2016 Weeks Of Treatment: 6 Clustered Wound: No Wound Measurements Length: (cm) 0 Width: (cm) 0 Depth: (cm) 0 Area: (cm) 0 Volume: (cm) 0 % Reduction in Area: 100% % Reduction in Volume: 100% Wound Description Classification: Partial Thickness Periwound Skin Texture Texture Color No Abnormalities Noted: No No Abnormalities Noted: No Moisture No Abnormalities Noted: No Electronic Signature(s) Signed: 12/14/2016 4:40:58 PM By: [name] Entered By: [name] on 12/13/2016 08:43:50 Smolenski, Tyrea [name]. ([id number]) -------------------------------------------------------------------------------- Vitals Details Patient Name: [name], Mikia [name]. Date of Service: 12/13/2016 8:15 AM Medical Record Number: [id number] Patient Account Number: [id number] Date of Birth/Sex: 06-09-1940 (76 y.o. Female) Treating RN: [name] Primary Care Kahil Agner: [name] Other Clinician: Referring Quirino Kakos: [name] Treating Elmina Hendel/Extender: [name] in Treatment: 6 Vital Signs Time Taken: 08:18 Temperature (F): 97.9 Height (in): 68 Pulse (bpm): 63 Weight (lbs): 191.1 Respiratory Rate (breaths/min): 18 Body Mass Index (BMI): 29.1 Blood Pressure (mmHg): 125/59 Reference Range: 80 - 120 mg / dl Electronic Signature(s) Signed: 12/14/2016 4:40:58 PM By: [name] Entered By: [name] on 12/13/2016 08:20:19

## 2016-12-31 ENCOUNTER — Encounter: Payer: Medicare Other | Attending: Surgery | Admitting: Surgery

## 2016-12-31 DIAGNOSIS — I872 Venous insufficiency (chronic) (peripheral): Secondary | ICD-10-CM | POA: Diagnosis not present

## 2016-12-31 DIAGNOSIS — L959 Vasculitis limited to the skin, unspecified: Secondary | ICD-10-CM | POA: Diagnosis not present

## 2016-12-31 DIAGNOSIS — I10 Essential (primary) hypertension: Secondary | ICD-10-CM | POA: Insufficient documentation

## 2016-12-31 DIAGNOSIS — I83013 Varicose veins of right lower extremity with ulcer of ankle: Secondary | ICD-10-CM | POA: Diagnosis not present

## 2016-12-31 DIAGNOSIS — L97312 Non-pressure chronic ulcer of right ankle with fat layer exposed: Secondary | ICD-10-CM | POA: Diagnosis not present

## 2016-12-31 DIAGNOSIS — L97519 Non-pressure chronic ulcer of other part of right foot with unspecified severity: Secondary | ICD-10-CM | POA: Diagnosis not present

## 2017-01-02 NOTE — Progress Notes (Signed)
Kim, [name] ([id number]) Visit Report for 12/31/2016 Chief Complaint Document Details Patient Name: [name], [name] Date of Service: 12/31/2016 8:45 AM Medical Record Number: [id number] Patient Account Number: [id number] Date of Birth/Sex: [birth year] (76 y.o. Female) Treating RN: Carolyne Fiscal, Debi Primary Care Provider: Elsie Stain Other Clinician: Referring Provider: Elsie Stain Treating Provider/Extender: Frann Rider in Treatment: 9 Information Obtained from: Patient Chief Complaint Patient presents for treatment of an open ulcer due to venous insufficiency Situated at her right medial ankle for about 3 weeks Electronic Signature(s) Signed: 12/31/2016 10:09:47 AM By: Christin Fudge MD, FACS Entered By: Christin Fudge on 12/31/2016 10:09:46 Dodds, Saragrace T. ([id number]) -------------------------------------------------------------------------------- HPI Details Patient Name: Kim, [name] Date of Service: 12/31/2016 8:45 AM Medical Record Number: [id number] Patient Account Number: [id number] Date of Birth/Sex: 1940-12-03 (76 y.o. Female) Treating RN: Ahmed Prima Primary Care Provider: Elsie Stain Other Clinician: Referring Provider: Elsie Stain Treating Provider/Extender: Frann Rider in Treatment: 9 History of Present Illness Location: Patient presents with an ulcer on the right medial ankle. Quality: Patient reports experiencing a dull pain to affected area(s). Severity: Patient states wound are getting worse. Duration: Patient has had the wound for < 4 weeks prior to presenting for treatment Timing: Pain in wound is Intermittent (comes and goes Context: The wound would happen gradually Modifying Factors: Other treatment(s) tried include:local compression stockings recently Associated Signs and Symptoms: Patient reports having increase swelling. HPI Description: 76 year old patient was recently seen by her PCP [name] and he  noted erythema and ulceration and started her on doxycycline. He noted a intact dorsalis pedis pulse.the medial right ankle had skin ulceration which had been treated by Keflex recently and notices made that she had had wound care treatments earlier. past medical history significant for back pain, endometriosis, high cholesterol, hypothyroidism, migraine and venous stasis ulcer treated at [facility]. she has never been a smoker the patient gives a history of right varicose vein stripping done in the year 2000 at [facility]. She has not been wearing her compression since. 11/05/2016 -- the venous duplex study has been done for reflux and we are awaiting her arterial study to be done. The patient is of a very nervous disposition and seems to be very anxious. 11/15/2016 -- venous duplex reflux study -- venous incompetence noted in the right saphenofemoral junction and the right great saphenous vein on the right lower extremity. The right great saphenous vein was harvested as mentioned above. Arterial study was done on 11/12/2016 but the results are not yet available. I will refer her to see the vascular surgeons for an opinion. 11/22/2016 -- lower extremity arterial duplex examination done showed no hemodynamically significant stenosis in bilateral arterial flow examination. As far as a venous examination goes she had declined to get her left leg studied and the right leg is also noted on 11/15/2016. On asking the patient about this she says she never told the technician not to do her left leg but it may have been a misunderstanding. The patient will be seeing the vascular surgeons soon and I have asked them to address whether the left leg needs a venous duplex study. 12/13/2016 -- was seen by Dr. Leotis Pain on 11/30/2016, who reviewed her recent venous duplex study,a noninvasive arterial study, and after a thorough review has recommended to continue with compression stockings daily and if the  venous ulceration recurs then he would consider her for form sclerotherapy of the incompetent accessory saphenous vein. 12/31/2016 -- the patient was  recently discharged but comes back with a small open area on her medial foot which is in the region of previous varicosities which are under the care of Dr. Corene Cornea dew. The patient has been very compliant with wearing her compression stockings Electronic Signature(s) Signed: 12/31/2016 10:10:33 AM By: Christin Fudge MD, FACS Entered By: Christin Fudge on 12/31/2016 10:10:32 [name] ([id number]) -------------------------------------------------------------------------------- Physical Exam Details Patient Name: [name], [name] Date of Service: 12/31/2016 8:45 AM Medical Record Number: [id number] Patient Account Number: [id number] Date of Birth/Sex: 1940-10-06 (76 y.o. Female) Treating RN: Ahmed Prima Primary Care Provider: Elsie Stain Other Clinician: Referring Provider: Elsie Stain Treating Provider/Extender: Frann Rider in Treatment: 9 Constitutional . Pulse regular. Respirations normal and unlabored. Afebrile. . Eyes Nonicteric. Reactive to light. Ears, Nose, Mouth, and Throat Lips, teeth, and gums WNL.[name] Moist mucosa without lesions. Neck supple and nontender. No palpable supraclavicular or cervical adenopathy. Normal sized without goiter. Respiratory WNL. No retractions.. Cardiovascular Pedal Pulses WNL. No clubbing, cyanosis or edema. Lymphatic No adneopathy. No adenopathy. No adenopathy. Musculoskeletal Adexa without tenderness or enlargement.. Digits and nails w/o clubbing, cyanosis, infection, petechiae, ischemia, or inflammatory conditions.. Integumentary (Hair, Skin) No suspicious lesions. No crepitus or fluctuance. No peri-wound warmth or erythema. No masses.[name] Psychiatric Judgement and insight Intact.. No evidence of depression, anxiety, or agitation.. Notes there is a small open area of  superficial ulceration in the region of her medial right foot which is away from the previous ankle wound which has still continued to be healed. No debridement was required today. Electronic Signature(s) Signed: 12/31/2016 10:11:27 AM By: Christin Fudge MD, FACS Entered By: Christin Fudge on 12/31/2016 10:11:27 [name] ([id number]) -------------------------------------------------------------------------------- Physician Orders Details Patient Name: [name], Quanesha T. Date of Service: 12/31/2016 8:45 AM Medical Record Number: [id number] Patient Account Number: [id number] Date of Birth/Sex: 11-14-1940 (76 y.o. Female) Treating RN: Carolyne Fiscal, Debi Primary Care Provider: Elsie Stain Other Clinician: Referring Provider: Elsie Stain Treating Provider/Extender: Frann Rider in Treatment: 9 Verbal / Phone Orders: Yes Clinician: Carolyne Fiscal, Debi Read Back and Verified: Yes Diagnosis Coding Wound Cleansing Wound #2 Right,Medial Foot o Clean wound with Normal Saline. o Cleanse wound with mild soap and water o May Shower, gently pat wound dry prior to applying new dressing. Anesthetic (add to Medication List) Wound #2 Right,Medial Foot o Topical Lidocaine 4% cream applied to wound bed prior to debridement (In Clinic Only). Primary Wound Dressing Wound #2 Right,Medial Foot o Silvercel Non-Adherent Secondary Dressing Wound #2 Right,Medial Foot o Kerlix and Coban o Foam Dressing Change Frequency Wound #2 Right,Medial Foot o Change dressing every other day. Follow-up Appointments o Return Appointment in 2 weeks. Edema Control Wound #2 Right,Medial Foot o Patient to wear own compression stockings o Elevate legs to the level of the heart and pump ankles as often as possible Additional Orders / Instructions Wound #2 Right,Medial Foot o Vitamin A; Vitamin C, Zinc o Increase protein intake. Consults o Vascular - Dr. Lucky Cowboy Patient  Medications Allergies: codeine Ihde, Tifanie T. ([id number]) Notifications Medication Indication Start End lidocaine DOSE 1 - topical 4 % cream - 1 cream topical Electronic Signature(s) Signed: 12/31/2016 12:55:29 PM By: Christin Fudge MD, FACS Signed: 12/31/2016 4:53:17 PM By: Alric Quan Entered By: Alric Quan on 12/31/2016 09:16:56 Aquilino, Daysia T. ([id number]) -------------------------------------------------------------------------------- Prescription 12/31/2016 Patient Name: [name], Quianna T. Provider: Christin Fudge MD Date of Birth: 03-22-1940 NPI#: [id number] Sex: F DEA#: [id number] Phone #: [phone] License #: Patient Address: [address] [place] [phone]  [place], [place] [address] [address] [place], [place] [address] [phone] Allergies codeine Reaction: vomiting Medication Medication: Route: Strength: Form: lidocaine 4 % topical cream topical 4% cream Class: TOPICAL LOCAL ANESTHETICS Dose: Frequency / Time: Indication: 1 1 cream topical Number of Refills: Number of Units: 0 Generic Substitution: Start Date: End Date: One Time Use: Substitution Permitted No Note to Pharmacy: Signature(s): Date(s): Electronic Signature(s) Signed: 12/31/2016 12:55:29 PM By: Christin Fudge MD, FACS Signed: 12/31/2016 4:53:17 PM By: Alric Quan Entered By: Alric Quan on 12/31/2016 09:16:56 Vos, Takhia T. ([id number]) --------------------------------------------------------------------------------  Problem List Details Patient Name: Markus, Beulah T. Date of Service: 12/31/2016 8:45 AM Medical Record Number: [id number] Patient Account Number: [id number] Date of Birth/Sex: 1940/11/26 (76 y.o. 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 ([id number]) -------------------------------------------------------------------------------- Progress Note Details Patient Name: Lenhoff, Shavaughn T. Date of Service: 12/31/2016 8:45 AM Medical Record Number: [id number] Patient Account Number: [id number] Date of Birth/Sex: 26-Mar-1940 (76 y.o. Female) Treating RN: Carolyne Fiscal, Debi Primary Care Provider: Elsie Stain Other Clinician: Referring Provider: Elsie Stain Treating Provider/Extender: Frann Rider in Treatment: 9 Subjective Chief Complaint Information obtained from Patient Patient presents for treatment of an open ulcer due to venous insufficiency Situated at her right medial ankle for about 3 weeks History of Present Illness (HPI) The following HPI elements were documented for the patient's wound: Location: Patient presents with an ulcer on the right medial ankle. Quality: Patient reports experiencing a dull pain to affected area(s). Severity: Patient states wound are getting worse. Duration: Patient has had the wound for < 4 weeks prior to presenting for treatment Timing: Pain in wound is Intermittent (comes and goes Context: The wound would happen gradually Modifying Factors: Other treatment(s) tried include:local compression stockings recently Associated Signs and Symptoms: Patient reports having increase swelling. 76 year old patient was recently seen by her PCP [name] and he noted erythema and ulceration and started her on doxycycline. He noted a  intact dorsalis pedis pulse.the medial right ankle had skin ulceration which had been treated by Keflex recently and notices made that she had had wound care treatments earlier. past medical history significant for back pain, endometriosis, high cholesterol, hypothyroidism, migraine and venous stasis ulcer treated at [facility]. she has never been a smoker the patient gives a history of right varicose vein stripping done in the year 2000 at [facility]. She has not been wearing her compression since. 11/05/2016 -- the venous duplex study has been done for reflux and we are awaiting her arterial study to be done. The patient is of a very nervous disposition and seems to be very anxious. 11/15/2016 -- venous duplex reflux study -- venous incompetence noted in the right saphenofemoral junction and the right great saphenous vein on the right lower extremity. The right great saphenous vein was harvested as mentioned above. Arterial study was done on 11/12/2016 but the results are not yet available. I will refer her to see the vascular surgeons for an opinion. 11/22/2016 -- lower extremity arterial duplex examination  done showed no hemodynamically significant stenosis in bilateral arterial flow examination. As far as a venous examination goes she had declined to get her left leg studied and the right leg is also noted on 11/15/2016. On asking the patient about this she says she never told the technician not to do her left leg but it may have been a misunderstanding. The patient will be seeing the vascular surgeons soon and I have asked them to address whether the left leg needs a venous duplex study. 12/13/2016 -- was seen by Dr. Leotis Pain on 11/30/2016, who reviewed her recent venous duplex study,a noninvasive arterial study, and after a thorough review has recommended to continue with compression stockings daily and if the venous ulceration recurs then he would consider her for form sclerotherapy of  the incompetent accessory saphenous vein. 12/31/2016 -- the patient was recently discharged but comes back with a small open area on her medial foot which is in the region of previous varicosities which are under the care of Dr. Corene Cornea dew. The patient has been very compliant with wearing Caligiuri, Abella T. ([id number]) her compression stockings Patient History Information obtained from Patient. Family History Cancer - Siblings, Heart Disease - Father, Stroke - Mother, No family history of Diabetes, Hypertension, Kidney Disease, Lung Disease, Seizures, Thyroid Problems, Tuberculosis. Social History Never smoker, Marital Status - Married, Alcohol Use - Never, Drug Use - No History, Caffeine Use - Daily. 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 Respirations normal and unlabored. Afebrile. Vitals Time Taken: 8:46 AM, Height: 68 in, Weight: 191.1 lbs, BMI: 29.1, Temperature: 98.4 F, Pulse: 69 bpm, Respiratory Rate: 18 breaths/min, Blood Pressure: 142/66 mmHg. Eyes Nonicteric. Reactive to light. Ears, Nose, Mouth, and Throat Lips, teeth, and gums WNL.[name] Moist mucosa without lesions. Neck supple and nontender. No palpable supraclavicular or cervical adenopathy. Normal sized without goiter. Respiratory WNL. No retractions.. Cardiovascular Pedal Pulses WNL. No clubbing, cyanosis or edema. Lymphatic No adneopathy. No adenopathy. No adenopathy. Musculoskeletal Adexa without tenderness or enlargement.. Digits and nails w/o clubbing, cyanosis, infection, petechiae, ischemia, or inflammatory conditions.[name] Psychiatric JASALYN, FRYSINGER T. ([id number]) Judgement and insight Intact.. No evidence of depression, anxiety, or agitation.. General Notes: there is a small open area of superficial ulceration in the region of her medial right foot which is away from the previous ankle wound which has still continued to be  healed. No debridement was required today. Integumentary (Hair, Skin) No suspicious lesions. No crepitus or fluctuance. No peri-wound warmth or erythema. No masses.. Wound #2 status is Open. Original cause of wound was Gradually Appeared. The wound is located on the Right,Medial Foot. The wound measures 0.6cm length x 0.6cm width x 0.1cm depth; 0.283cm^2 area and 0.028cm^3 volume. There is no tunneling or undermining noted. There is a medium amount of serous drainage noted. The wound margin is distinct with the outline attached to the wound base. There is no granulation within the wound bed. There is a large (67-100%) amount of necrotic tissue within the wound bed including Adherent Slough. The periwound skin appearance exhibited: Erythema. The surrounding wound skin color is noted with erythema which is circumferential. Periwound temperature was noted as No Abnormality. The periwound has tenderness on palpation. 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 Cleanse wound with mild  soap and water May Shower, gently pat wound dry prior to applying new dressing. Anesthetic (add to Medication List): Wound #2 Right,Medial Foot: Topical Lidocaine 4% cream applied to wound bed prior to debridement (In Clinic Only). Primary Wound Dressing: Wound #2 Right,Medial Foot: Silvercel Non-Adherent Secondary Dressing: Wound #2 Right,Medial Foot: Kerlix and Coban Foam Dressing Change Frequency: Wound #2 Right,Medial Foot: Change dressing every other day. Follow-up Appointments: Return Appointment in 2 weeks. Edema Control: Wound #2 Right,Medial Foot: Patient to wear own compression stockings Snelgrove, Ailie T. ([id number]) Elevate legs to the level of the heart and pump ankles as  often as possible Additional Orders / Instructions: Wound #2 Right,Medial Foot: Vitamin A; Vitamin C, Zinc Increase protein intake. Consults ordered were: Vascular - Dr. Lucky Cowboy The following medication(s) was prescribed: lidocaine topical 4 % cream 1 1 cream topical in spite of being compliant with her compression stockings the patient has developed a small superficial ulceration on her right medial foot which I believe is as a result of her venous hypertension and superficial varicosities. I have requested her to ask for a reevaluation from Dr. Leotis Pain, as he had mentioned that he may consider sclerotherapy, if there is recurrence of ulceration. in the meanwhile I have asked her to use silver alginate, light dressing over this and continue to use her compression stockings and we have gone over great details about elevation and exercise. She has had all questions answered and will come back to see as regularly Electronic Signature(s) Signed: 12/31/2016 10:13:14 AM By: Christin Fudge MD, FACS Entered By: Christin Fudge on 12/31/2016 10:13:14 Tesar, Caren Macadam ([id number]) -------------------------------------------------------------------------------- ROS/PFSH Details Patient Name: [name], Stevana T. Date of Service: 12/31/2016 8:45 AM Medical Record Number: [id number] Patient Account Number: [id number] Date of Birth/Sex: 1940-02-01 (76 y.o. Female) Treating RN: Carolyne Fiscal, Debi Primary Care Provider: Elsie Stain Other Clinician: Referring Provider: Elsie Stain Treating Provider/Extender: Frann Rider in Treatment: 9 Information Obtained From Patient Wound History Do you currently have one or more open woundso Yes How many open wounds do you currently haveo 1 Approximately how long have you had your woundso 3 weeks How have you been treating your wound(s) until nowo triple abx ointment Has your wound(s) ever healed and then re-openedo No Have you had any lab work done in  the past montho No Have you tested positive for an antibiotic resistant organism (MRSA, VRE)o No Have you tested positive for osteomyelitis (bone infection)o No Have you had any tests for circulation on your legso Yes Eyes Medical History: Negative for: Cataracts; Glaucoma; Optic Neuritis Ear/Nose/Mouth/Throat Medical History: Negative for: Chronic sinus problems/congestion; Middle ear problems Hematologic/Lymphatic Medical History: Negative for: Anemia; Hemophilia; Human Immunodeficiency Virus; Lymphedema; Sickle Cell Disease Respiratory Medical History: Negative for: Aspiration; Asthma; Chronic Obstructive Pulmonary Disease (COPD); Sleep Apnea; Tuberculosis Cardiovascular Medical History: Positive for: Hypertension - medication controlled Negative for: Angina; Arrhythmia; Congestive Heart Failure; Coronary Artery Disease; Deep Vein Thrombosis; Hypotension; Myocardial Infarction; Peripheral Arterial Disease; Peripheral Venous Disease; Phlebitis; Vasculitis Past Medical History Notes: Right leg stripped veins (03/1998) Gastrointestinal Medical History: Negative for: Cirrhosis ; Colitis; Crohnos; Hepatitis A; Hepatitis B; Hepatitis C Endocrine Eakins, Maggi T. ([id number]) Medical History: Negative for: Type I Diabetes; Type II Diabetes Genitourinary Medical History: Negative for: End Stage Renal Disease Immunological Medical History: Negative for: Lupus Erythematosus; Raynaudos Integumentary (Skin) Medical History: Negative for: History of Burn; History of pressure wounds Musculoskeletal Medical History: Negative for: Gout; Rheumatoid Arthritis; Osteoarthritis; Osteomyelitis Neurologic Medical History: Negative for: Dementia; Neuropathy; Quadriplegia; Paraplegia;  Seizure Disorder Oncologic Medical History: Negative for: Received Chemotherapy; Received Radiation Psychiatric Medical History: Negative for: Anorexia/bulimia; Confinement  Anxiety Immunizations Pneumococcal Vaccine: Received Pneumococcal Vaccination: Yes Implantable Devices Family and Social History Cancer: Yes - Siblings; Diabetes: No; Heart Disease: Yes - Father; Hypertension: No; Kidney Disease: No; Lung Disease: No; Seizures: No; Stroke: Yes - Mother; Thyroid Problems: No; Tuberculosis: No; Never smoker; Marital Status - Married; Alcohol Use: Never; Drug Use: No History; Caffeine Use: Daily; Advanced Directives: No; Patient does not want information on Advanced Directives; Do not resuscitate: No; Living Will: No; Medical Power of Attorney: No Physician Affirmation I have reviewed and agree with the above information. Electronic Signature(s) Signed: 12/31/2016 12:55:29 PM By: Christin Fudge MD, FACS Signed: 12/31/2016 4:53:17 PM By: Alric Quan Entered By: Christin Fudge on 12/31/2016 10:10:45 ELLEN, GORIS T. ([id number]) Cradle, Munira TMarland Kitchen ([id number]) -------------------------------------------------------------------------------- SuperBill Details Patient Name: Tokarz, Anitra T. Date of Service: 12/31/2016 Medical Record Number: [id number] Patient Account Number: [id number] Date of Birth/Sex: [birth year] (76 y.o. 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 Facility Procedures CPT4 Code: [id number] Description: 99213 - WOUND CARE VISIT-LEV 3 EST PT Modifier: Quantity: 1 Physician Procedures CPT4 Code: [id number] Description: 35465 - WC PHYS LEVEL 3 - EST PT ICD-10 Diagnosis Description K81.275 Non-pressure chronic ulcer of right ankle with fat layer e I83.013 Varicose veins of right lower extremity with ulcer of  ankl Modifier: xposed e Quantity: 1 Electronic Signature(s) Signed: 12/31/2016 1:50:30 PM By: Alric Quan Signed: 12/31/2016 4:49:12 PM By: Christin Fudge MD, FACS Previous Signature: 12/31/2016 10:13:36 AM Version By: Christin Fudge MD, FACS Entered By: Alric Quan on 12/31/2016 13:50:30

## 2017-01-04 NOTE — Progress Notes (Signed)
[name] ([id number]) Visit Report for 12/31/2016 Arrival Information Details Patient Name: [name], [name]. Date of Service: 12/31/2016 8:45 AM Medical Record Number: [id number] Patient Account Number: [id number] Date of Birth/Sex: [birth year] (76 y.o. Female) Treating RN: Ahmed Prima Primary Care Theona Muhs: Elsie Stain Other Clinician: Referring Marvis Bakken: Elsie Stain Treating Marielis Samara/Extender: Frann Rider in Treatment: 9 Visit Information History Since Last Visit All ordered tests and consults were completed: No Patient Arrived: Ambulatory Added or deleted any medications: No Arrival Time: 08:44 Any new allergies or adverse reactions: No Accompanied By: husband Had a fall or experienced change in No Transfer Assistance: None activities of daily living that may affect Patient Identification Verified: Yes risk of falls: Secondary Verification Process Yes Signs or symptoms of abuse/neglect since last visito No Completed: Hospitalized since last visit: No Patient Requires Transmission-Based No Has Dressing in Place as Prescribed: Yes Precautions: Pain Present Now: No Patient Has Alerts: Yes Patient Alerts: Patient on Blood Thinner 81 MG Aspirin NOT DiaBETIC Electronic Signature(s) Signed: 12/31/2016 4:53:17 PM By: Alric Quan Entered By: Alric Quan on 12/31/2016 08:45:53 Therien, [name] -------------------------------------------------------------------------------- Clinic Level of Care Assessment Details Patient Name: [name], [name]. Date of Service: 12/31/2016 8:45 AM Medical Record Number: [id number] Patient Account Number: [id number] Date of Birth/Sex: 30-Apr-1940 (76 y.o. Female) Treating RN: Carolyne Fiscal, Debi Primary Care Shelley Cocke: Elsie Stain Other Clinician: Referring Jamez Ambrocio: Elsie Stain Treating Zeus Marquis/Extender: Frann Rider in Treatment: 9 Clinic Level of Care Assessment Items TOOL 4  Quantity Score X - Use when only an EandM is performed on FOLLOW-UP visit 1 0 ASSESSMENTS - Nursing Assessment / Reassessment X - Reassessment of Co-morbidities (includes updates in patient status) 1 10 X- 1 5 Reassessment of Adherence to Treatment Plan ASSESSMENTS - Wound and Skin Assessment / Reassessment X - Simple Wound Assessment / Reassessment - one wound 1 5 []  - 0 Complex Wound Assessment / Reassessment - multiple wounds []  - 0 Dermatologic / Skin Assessment (not related to wound area) ASSESSMENTS - Focused Assessment []  - Circumferential Edema Measurements - multi extremities 0 []  - 0 Nutritional Assessment / Counseling / Intervention []  - 0 Lower Extremity Assessment (monofilament, tuning fork, pulses) []  - 0 Peripheral Arterial Disease Assessment (using hand held doppler) ASSESSMENTS - Ostomy and/or Continence Assessment and Care []  - Incontinence Assessment and Management 0 []  - 0 Ostomy Care Assessment and Management (repouching, etc.) PROCESS - Coordination of Care X - Simple Patient / Family Education for ongoing care 1 15 []  - 0 Complex (extensive) Patient / Family Education for ongoing care []  - 0 Staff obtains Programmer, systems, Records, Test Results / Process Orders []  - 0 Staff telephones HHA, Nursing Homes / Clarify orders / etc []  - 0 Routine Transfer to another Facility (non-emergent condition) []  - 0 Routine Hospital Admission (non-emergent condition) []  - 0 New Admissions / Biomedical engineer / Ordering NPWT, Apligraf, etc. []  - 0 Emergency Hospital Admission (emergent condition) X- 1 10 Simple Discharge Coordination Malicoat, [name] []  - 0 Complex (extensive) Discharge Coordination PROCESS - Special Needs []  - Pediatric / Minor Patient Management 0 []  - 0 Isolation Patient Management []  - 0 Hearing / Language / Visual special needs []  - 0 Assessment of Community assistance (transportation, D/C planning, etc.) []  - 0 Additional  assistance / Altered mentation []  - 0 Support Surface(s) Assessment (bed, cushion, seat, etc.) INTERVENTIONS - Wound Cleansing / Measurement X - Simple Wound Cleansing - one wound 1 5 []  - 0 Complex Wound Cleansing -  multiple wounds X- 1 5 Wound Imaging (photographs - any number of wounds) []  - 0 Wound Tracing (instead of photographs) X- 1 5 Simple Wound Measurement - one wound []  - 0 Complex Wound Measurement - multiple wounds INTERVENTIONS - Wound Dressings X - Small Wound Dressing one or multiple wounds 1 10 []  - 0 Medium Wound Dressing one or multiple wounds []  - 0 Large Wound Dressing one or multiple wounds X- 1 5 Application of Medications - topical []  - 0 Application of Medications - injection INTERVENTIONS - Miscellaneous []  - External ear exam 0 []  - 0 Specimen Collection (cultures, biopsies, blood, body fluids, etc.) []  - 0 Specimen(s) / Culture(s) sent or taken to Lab for analysis []  - 0 Patient Transfer (multiple staff / Civil Service fast streamer / Similar devices) []  - 0 Simple Staple / Suture removal (25 or less) []  - 0 Complex Staple / Suture removal (26 or more) []  - 0 Hypo / Hyperglycemic Management (close monitor of Blood Glucose) []  - 0 Ankle / Brachial Index (ABI) - do not check if billed separately X- 1 5 Vital Signs [name], [name] Has the patient been seen at the hospital within the last three years: Yes Total Score: 80 Level Of Care: New/Established - Level 3 Electronic Signature(s) Signed: 12/31/2016 4:53:17 PM By: Alric Quan Entered By: Alric Quan on 12/31/2016 13:50:23 Wierman, [name]. ([id number]) -------------------------------------------------------------------------------- Encounter Discharge Information Details Patient Name: [name], [name]. Date of Service: 12/31/2016 8:45 AM Medical Record Number: [id number] Patient Account Number: [id number] Date of Birth/Sex: 12/06/1940 (76 y.o. Female) Treating RN: Carolyne Fiscal,  Debi Primary Care Yaneth Fairbairn: Elsie Stain Other Clinician: Referring Caulin Begley: Elsie Stain Treating Farhiya Rosten/Extender: Frann Rider in Treatment: 9 Encounter Discharge Information Items Discharge Pain Level: 0 Discharge Condition: Stable Ambulatory Status: Ambulatory Discharge Destination: Home Transportation: Private Auto Accompanied By: husband Schedule Follow-up Appointment: Yes Medication Reconciliation completed and No provided to Patient/Care Aadam Zhen: Provided on Clinical Summary of Care: 12/31/2016 Form Type Recipient Paper Patient [facility] Electronic Signature(s) Signed: 01/04/2017 11:15:46 AM By: Ruthine Dose Entered By: Ruthine Dose on 12/31/2016 09:17:49 [name], [name]. ([id number]) -------------------------------------------------------------------------------- Lower Extremity Assessment Details Patient Name: [name], [name]. Date of Service: 12/31/2016 8:45 AM Medical Record Number: [id number] Patient Account Number: [id number] Date of Birth/Sex: 01-03-1941 (76 y.o. Female) Treating RN: Carolyne Fiscal, Debi Primary Care Chameka Mcmullen: Elsie Stain Other Clinician: Referring Genese Quebedeaux: Elsie Stain Treating Azam Gervasi/Extender: Frann Rider in Treatment: 9 Vascular Assessment Pulses: Dorsalis Pedis Palpable: [Right:Yes] Posterior Tibial Extremity colors, hair growth, and conditions: Extremity Color: [Right:Mottled] Temperature of Extremity: [Right:Warm] Capillary Refill: [Right:< 3 seconds] Toe Nail Assessment Left: Right: Thick: No Discolored: No Deformed: No Improper Length and Hygiene: No Electronic Signature(s) Signed: 12/31/2016 4:53:17 PM By: Alric Quan Entered By: Alric Quan on 12/31/2016 08:48:53 Lieser, Joel [name]. ([id number]) -------------------------------------------------------------------------------- Multi Wound Chart Details Patient Name: [name], [name]. Date of Service: 12/31/2016 8:45 AM Medical Record  Number: [id number] Patient Account Number: [id number] Date of Birth/Sex: [birth year] (76 y.o. 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] [N/A:N/A] Comorbid History: [2:Hypertension] [N/A:N/A] Date Acquired: [2:12/23/2016] [N/A:N/A] Weeks of Treatment: [2:0] [N/A:N/A] Wound Status: [2:Open] [N/A:N/A] Measurements L x W x D [2:0.6x0.6x0.1] [N/A:N/A] (  cm) Area (cm) : [2:0.283] [N/A:N/A] Volume (cm) : [2:0.028] [N/A:N/A] Classification: [2:Partial Thickness] [N/A:N/A] Exudate Amount: [2:Medium] [N/A:N/A] Exudate Type: [2:Serous] [N/A:N/A] Exudate Color: [2:amber] [N/A:N/A] Wound Margin: [2:Distinct, outline attached] [N/A:N/A] Granulation Amount: [2:None Present (0%)] [N/A:N/A] Necrotic Amount: [2:Large (67-100%)] [N/A:N/A] Exposed Structures: [2:Fascia: No Fat Layer (Subcutaneous Tissue) Exposed: No Tendon: No Muscle: No Joint: No Bone: No] [N/A:N/A] Epithelialization: [2:None] [N/A:N/A] Periwound Skin Texture: [2:No Abnormalities Noted] [N/A:N/A] Periwound Skin Moisture: [2:No Abnormalities Noted] [N/A:N/A] Periwound Skin Color: [2:Erythema: Yes] [N/A:N/A] Erythema Location: [2:Circumferential] [N/A:N/A] Temperature: [2:No Abnormality] [N/A:N/A] Tenderness on Palpation: [2:Yes] [N/A:N/A] Wound Preparation: [2:Ulcer Cleansing: Rinsed/Irrigated with Saline] [N/A:N/A] Topical Anesthetic Applied: Other: lidocaine 4% Treatment Notes Wound #2 (Right, Medial Foot) 1. Cleansed with: Clean wound  with Normal Saline 4. Dressing Applied: Other dressing (specify in notes) 5. Secondary Dressing Applied Foam Kerlix/Conform 7. Secured with Recruitment consultant) Signed: 12/31/2016 10:09:40 AM By: Christin Fudge MD, FACS Entered By: Christin Fudge on 12/31/2016 10:09:39 KRIMSON, MASSMANN ([id number]) -------------------------------------------------------------------------------- [place] Details Patient Name: [name], [name]. Date of Service: 12/31/2016 8:45 AM Medical Record Number: [id number] Patient Account Number: [id number] Date of Birth/Sex: 14-Apr-1940 (76 y.o. Female) Treating RN: Carolyne Fiscal, Debi Primary Care Sheritta Deeg: Elsie Stain Other Clinician: Referring Zeeshan Korte: Elsie Stain Treating Jentri Aye/Extender: Frann Rider in Treatment: 9 Active Inactive ` Abuse / Safety / Falls / Self Care Management Nursing Diagnoses: Potential for falls Goals: Patient will not experience any injury related to falls Date Initiated: 10/29/2016 Target Resolution Date: 03/26/2017 Goal Status: Active Interventions: Podiatry chair, stretcher in low position and side rails up as needed Notes: ` Orientation to the Wound Care Program Nursing Diagnoses: Knowledge deficit related to the wound healing center program Goals: Patient/caregiver will verbalize understanding of the [place] Date Initiated: 10/29/2016 Target Resolution Date: 01/22/2017 Goal Status: Active Interventions: Provide education on orientation to the wound center Notes: ` Venous Leg Ulcer Nursing Diagnoses: Potential for venous Insuffiency (use before diagnosis confirmed) Goals: Patient will maintain optimal edema control Date Initiated: 10/29/2016 Target Resolution Date: 04/23/2017 Goal Status: Active Interventions: Sellen, Kaianna [name]. ([id number]) Assess peripheral edema status every visit. Treatment Activities: Non-invasive vascular studies :  10/29/2016 Notes: ` Wound/Skin Impairment Nursing Diagnoses: Impaired tissue integrity Goals: Ulcer/skin breakdown will heal within 14 weeks Date Initiated: 10/29/2016 Target Resolution Date: 03/26/2017 Goal Status: Active Interventions: Assess ulceration(s) every visit Treatment Activities: Referred to DME Aidaly Cordner for dressing supplies : 10/29/2016 Notes: Electronic Signature(s) Signed: 12/31/2016 4:53:17 PM By: Alric Quan Entered By: Alric Quan on 12/31/2016 08:54:50 [name], [name]. ([id number]) -------------------------------------------------------------------------------- Pain Assessment Details Patient Name: [name], [name]. Date of Service: 12/31/2016 8:45 AM Medical Record Number: [id number] Patient Account Number: [id number] Date of Birth/Sex: [birth year] (76 y.o. Female) Treating RN: Ahmed Prima Primary Care Kynan Peasley: Elsie Stain Other Clinician: Referring Kenedi Cilia: Elsie Stain Treating Xeng Kucher/Extender: Frann Rider in Treatment: 9 Active Problems Location of Pain Severity and Description of Pain Patient Has Paino No Site Locations Pain Management and Medication Current Pain Management: Electronic Signature(s) Signed: 12/31/2016 4:53:17 PM By: Alric Quan Entered By: Alric Quan on 12/31/2016 08:46:00 Scorza, [name]. ([id number]) -------------------------------------------------------------------------------- Patient/Caregiver Education Details Patient Name: [name], [name]. Date of Service: 12/31/2016 8:45 AM Medical Record Number: [id number] Patient Account Number: [id number] Date of Birth/Gender: May 26, 1940 (76 y.o. Female) Treating RN: Ahmed Prima Primary Care Physician: Elsie Stain Other Clinician: Referring Physician: Elsie Stain Treating Physician/Extender: Frann Rider in Treatment: 9 Education Assessment Education Provided To: Patient Education Topics Provided Welcome To The Wound  Care Center: Handouts: Welcome To The [place] Methods: Explain/Verbal Responses: State content correctly Wound/Skin Impairment: Handouts: Caring for Your Ulcer, Other: change dressing as ordered Methods: Demonstration, Explain/Verbal Responses: State content correctly Electronic Signature(s) Signed: 12/31/2016 4:53:17 PM By: Alric Quan Entered By: Alric Quan on 12/31/2016 09:02:01 [name], [name]. ([id number]) -------------------------------------------------------------------------------- Wound Assessment Details Patient Name: [name], [name]. Date of Service: 12/31/2016 8:45 AM Medical Record Number: [id number] Patient Account Number: [id number] Date of Birth/Sex: 17-Aug-1940 (76 y.o. Female) Treating RN: Carolyne Fiscal, Debi Primary Care Zeina Akkerman: Elsie Stain Other Clinician: Referring Talina Pleitez: Elsie Stain Treating Fate Galanti/Extender: Frann Rider in Treatment: 9 Wound Status Wound Number: 2 Primary Etiology: Vasculopathy Wound Location: Right Foot - Medial Wound Status: Open Wounding Event: Gradually Appeared Comorbid History: Hypertension Date Acquired: 12/23/2016 Weeks Of Treatment: 0 Clustered Wound: No Photos Photo Uploaded By: Alric Quan on 01/03/2017 15:57:10 Wound Measurements Length: (cm) 0.6 Width: (cm) 0.6 Depth: (cm) 0.1 Area: (cm) 0.283 Volume: (cm) 0.028 % Reduction in Area: % Reduction in Volume: Epithelialization: None Tunneling: No Undermining: No Wound Description Classification: Partial Thickness Wound Margin: Distinct, outline attached Exudate Amount: Medium Exudate Type: Serous Exudate Color: amber Foul Odor After Cleansing: No Slough/Fibrino Yes Wound Bed Granulation Amount: None Present (0%) Exposed Structure Necrotic Amount: Large (67-100%) Fascia Exposed: No Necrotic Quality: Adherent Slough Fat Layer (Subcutaneous Tissue) Exposed: No Tendon Exposed: No Muscle Exposed: No Joint Exposed:  No Bone Exposed: No Periwound Skin Texture [name], [name]. ([id number]) Texture Color No Abnormalities Noted: No No Abnormalities Noted: No Erythema: Yes Moisture Erythema Location: Circumferential No Abnormalities Noted: No Temperature / Pain Temperature: No Abnormality Tenderness on Palpation: Yes Wound Preparation Ulcer Cleansing: Rinsed/Irrigated with Saline Topical Anesthetic Applied: Other: lidocaine 4%, Treatment Notes Wound #2 (Right, Medial Foot) 1. Cleansed with: Clean wound with Normal Saline 4. Dressing Applied: Other dressing (specify in notes) 5. Secondary Dressing Applied Foam Kerlix/Conform 7. Secured with Recruitment consultant) Signed: 12/31/2016 4:53:17 PM By: Alric Quan Entered By: Alric Quan on 12/31/2016 08:52:26 Wilhelmsen, Shawntel TMarland Kitchen ([id number]) -------------------------------------------------------------------------------- Vitals Details Patient Name: [name], Alivia [name]. Date of Service: 12/31/2016 8:45 AM Medical Record Number: [id number] Patient Account Number: [id number] Date of Birth/Sex: 11-25-1940 (76 y.o. Female) Treating RN: Carolyne Fiscal, Debi Primary Care Aqeel Norgaard: Elsie Stain Other Clinician: Referring Ola Fawver: Elsie Stain Treating Flora Ratz/Extender: Frann Rider in Treatment: 9 Vital Signs Time Taken: 08:46 Temperature (F): 98.4 Height (in): 68 Pulse (bpm): 69 Weight (lbs): 191.1 Respiratory Rate (breaths/min): 18 Body Mass Index (BMI): 29.1 Blood Pressure (mmHg): 142/66 Reference Range: 80 - 120 mg / dl Electronic Signature(s) Signed: 12/31/2016 4:53:17 PM By: Alric Quan Entered By: Alric Quan on 12/31/2016 08:48:02

## 2017-01-14 ENCOUNTER — Encounter: Payer: Medicare Other | Admitting: Surgery

## 2017-01-14 DIAGNOSIS — I83013 Varicose veins of right lower extremity with ulcer of ankle: Secondary | ICD-10-CM | POA: Diagnosis not present

## 2017-01-14 DIAGNOSIS — L97319 Non-pressure chronic ulcer of right ankle with unspecified severity: Secondary | ICD-10-CM | POA: Diagnosis not present

## 2017-01-14 DIAGNOSIS — L97312 Non-pressure chronic ulcer of right ankle with fat layer exposed: Secondary | ICD-10-CM | POA: Diagnosis not present

## 2017-01-14 DIAGNOSIS — I872 Venous insufficiency (chronic) (peripheral): Secondary | ICD-10-CM | POA: Diagnosis not present

## 2017-01-14 DIAGNOSIS — I10 Essential (primary) hypertension: Secondary | ICD-10-CM | POA: Diagnosis not present

## 2017-01-15 NOTE — Progress Notes (Signed)
RANELL, FINELLI ([id number]) Visit Report for 01/14/2017 Chief Complaint Document Details Patient Name: [name], [name] Date of Service: 01/14/2017 9:45 AM Medical Record Number: [id number] Patient Account Number: [id number] Date of Birth/Sex: 01-Jul-1940 (76 y.o. Female) Treating RN: Cornell Barman Primary Care Provider: Elsie Stain Other Clinician: Referring Provider: Elsie Stain Treating Provider/Extender: Frann Rider in Treatment: 11 Information Obtained from: Patient Chief Complaint Patient presents for treatment of an open ulcer due to venous insufficiency Situated at her right medial ankle for about 3 weeks Electronic Signature(s) Signed: 01/14/2017 10:09:03 AM By: Christin Fudge MD, FACS Entered By: Christin Fudge on 01/14/2017 10:09:03 Barcellos, Nyaisha T. ([id number]) -------------------------------------------------------------------------------- HPI Details Patient Name: [name], [name] Date of Service: 01/14/2017 9:45 AM Medical Record Number: [id number] Patient Account Number: [id number] Date of Birth/Sex: [birth year] (76 y.o. Female) Treating RN: Cornell Barman Primary Care Provider: Elsie Stain Other Clinician: Referring Provider: Elsie Stain Treating Provider/Extender: Frann Rider in Treatment: 11 History of Present Illness Location: Patient presents with an ulcer on the right medial ankle. Quality: Patient reports experiencing a dull pain to affected area(s). Severity: Patient states wound are getting worse. Duration: Patient has had the wound for < 4 weeks prior to presenting for treatment Timing: Pain in wound is Intermittent (comes and goes Context: The wound would happen gradually Modifying Factors: Other treatment(s) tried include:local compression stockings recently Associated Signs and Symptoms: Patient reports having increase swelling. HPI Description: 76 year old patient was recently seen by her PCP [name] and he noted  erythema and ulceration and started her on doxycycline. He noted a intact dorsalis pedis pulse.the medial right ankle had skin ulceration which had been treated by Keflex recently and notices made that she had had wound care treatments earlier. past medical history significant for back pain, endometriosis, high cholesterol, hypothyroidism, migraine and venous stasis ulcer treated at [facility]. she has never been a smoker the patient gives a history of right varicose vein stripping done in the year 2000 at [facility]. She has not been wearing her compression since. 11/05/2016 -- the venous duplex study has been done for reflux and we are awaiting her arterial study to be done. The patient is of a very nervous disposition and seems to be very anxious. 11/15/2016 -- venous duplex reflux study -- venous incompetence noted in the right saphenofemoral junction and the right great saphenous vein on the right lower extremity. The right great saphenous vein was harvested as mentioned above. Arterial study was done on 11/12/2016 but the results are not yet available. I will refer her to see the vascular surgeons for an opinion. 11/22/2016 -- lower extremity arterial duplex examination done showed no hemodynamically significant stenosis in bilateral arterial flow examination. As far as a venous examination goes she had declined to get her left leg studied and the right leg is also noted on 11/15/2016. On asking the patient about this she says she never told the technician not to do her left leg but it may have been a misunderstanding. The patient will be seeing the vascular surgeons soon and I have asked them to address whether the left leg needs a venous duplex study. 12/13/2016 -- was seen by Dr. Leotis Pain on 11/30/2016, who reviewed her recent venous duplex study,a noninvasive arterial study, and after a thorough review has recommended to continue with compression stockings daily and if the  venous ulceration recurs then he would consider her for form sclerotherapy of the incompetent accessory saphenous vein. 12/31/2016 -- the patient was  recently discharged but comes back with a small open area on her medial foot which is in the region of previous varicosities which are under the care of Dr. Corene Cornea dew. The patient has been very compliant with wearing her compression stockings. 01/14/2017 -- she has done very well and her wounds are now healed. She has still not gotten an appointment to see Dr. Lucky Cowboy and I have urged her to keep up with this Electronic Signature(s) Signed: 01/14/2017 10:10:22 AM By: Christin Fudge MD, FACS Entered By: Christin Fudge on 01/14/2017 10:10:21 DEVOTA, VIRUET ([id number]) Erler, Starla TMarland Kitchen ([id number]) -------------------------------------------------------------------------------- Physical Exam Details Patient Name: [name], [name] Date of Service: 01/14/2017 9:45 AM Medical Record Number: [id number] Patient Account Number: [id number] Date of Birth/Sex: [birth year] (76 y.o. Female) Treating RN: Cornell Barman Primary Care Provider: Elsie Stain Other Clinician: Referring Provider: Elsie Stain Treating Provider/Extender: Frann Rider in Treatment: 11 Constitutional . Pulse regular. Respirations normal and unlabored. Afebrile. . Eyes Nonicteric. Reactive to light. Ears, Nose, Mouth, and Throat Lips, teeth, and gums WNL.[name] Moist mucosa without lesions. Neck supple and nontender. No palpable supraclavicular or cervical adenopathy. Normal sized without goiter. Respiratory WNL. No retractions.. Cardiovascular Pedal Pulses WNL. No clubbing, cyanosis or edema. Gastrointestinal (GI) Abdomen without masses or tenderness.. No liver or spleen enlargement or tenderness.. Genitourinary (GU) No hydrocele, spermatocele, tenderness of the cord, or testicular mass.[name] Penis without lesions.Lowella Fairy without lesions. No cystocele, or rectocele.  Pelvic support intact, no discharge.[name] Urethra without masses, tenderness or scarring.[name] Lymphatic No adneopathy. No adenopathy. No adenopathy. Musculoskeletal Adexa without tenderness or enlargement.. Digits and nails w/o clubbing, cyanosis, infection, petechiae, ischemia, or inflammatory conditions.. Integumentary (Hair, Skin) No suspicious lesions. No crepitus or fluctuance. No peri-wound warmth or erythema. No masses.[name] Psychiatric Judgement and insight Intact.. No evidence of depression, anxiety, or agitation.. Notes the right medial ankle area is looking very good and there is no evidence of any open ulceration. She continues to have stigmata of superficial venous hypertension. Electronic Signature(s) Signed: 01/14/2017 10:11:06 AM By: Christin Fudge MD, FACS Entered By: Christin Fudge on 01/14/2017 10:11:06 [name] ([id number]) -------------------------------------------------------------------------------- Physician Orders Details Patient Name: [name], Angelina T. Date of Service: 01/14/2017 9:45 AM Medical Record Number: [id number] Patient Account Number: [id number] Date of Birth/Sex: 1940-07-23 (76 y.o. Female) Treating RN: Montey Hora Primary Care Provider: Elsie Stain Other Clinician: Referring Provider: Elsie Stain Treating Provider/Extender: Frann Rider in Treatment: 73 Verbal / Phone Orders: No Diagnosis Coding [facility]charge [facility] o Discharge from [place] wearing compression hose every day. Electronic Signature(s) Signed: 01/14/2017 1:42:42 PM By: Christin Fudge MD, FACS Signed: 01/14/2017 4:57:04 PM By: Montey Hora Entered By: Montey Hora on 01/14/2017 10:03:58 Swarey, Jalen TMarland Kitchen ([id number]) -------------------------------------------------------------------------------- Problem List Details Patient Name: Trice, Hawraa T. Date of Service: 01/14/2017 9:45 AM Medical Record Number: [id number] Patient  Account Number: [id number] Date of Birth/Sex: 07-01-1940 (76 y.o. 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 ([id number]) -------------------------------------------------------------------------------- Progress Note Details Patient Name: Cermak, Xochilth T. Date of Service: 01/14/2017 9:45 AM Medical  Record Number: [id number] Patient Account Number: [id number] Date of Birth/Sex: 1940/10/16 (76 y.o. Female) Treating RN: Cornell Barman Primary Care Provider: Elsie Stain Other Clinician: Referring Provider: Elsie Stain Treating Provider/Extender: Frann Rider in Treatment: 11 Subjective Chief Complaint Information obtained from Patient Patient presents for treatment of an open ulcer due to venous insufficiency Situated at her right medial ankle for about 3 weeks History of Present Illness (HPI) The following HPI elements were documented for the patient's wound: Location: Patient presents with an ulcer on the right medial ankle. Quality: Patient reports experiencing a dull pain to affected area(s). Severity: Patient states wound are getting worse. Duration: Patient has had the wound for < 4 weeks prior to presenting for treatment Timing: Pain in wound is Intermittent (comes and goes Context: The wound would happen gradually Modifying Factors: Other treatment(s) tried include:local compression stockings  recently Associated Signs and Symptoms: Patient reports having increase swelling. 76 year old patient was recently seen by her PCP [name] and he noted erythema and ulceration and started her on doxycycline. He noted a intact dorsalis pedis pulse.the medial right ankle had skin ulceration which had been treated by Keflex recently and notices made that she had had wound care treatments earlier. past medical history significant for back pain, endometriosis, high cholesterol, hypothyroidism, migraine and venous stasis ulcer treated at [facility]. she has never been a smoker the patient gives a history of right varicose vein stripping done in the year 2000 at [facility]. She has not been wearing her compression since. 11/05/2016 -- the venous duplex study has been done for reflux and we are awaiting her arterial study to be done. The patient is of a very nervous disposition and seems to be very anxious. 11/15/2016 -- venous duplex reflux study -- venous incompetence noted in the right saphenofemoral junction and the right great saphenous vein on the right lower extremity. The right great saphenous vein was harvested as mentioned above. Arterial study was done on 11/12/2016 but the results are not yet available. I will refer her to see the vascular surgeons for an opinion. 11/22/2016 -- lower extremity arterial duplex examination done showed no hemodynamically significant stenosis in bilateral arterial flow examination. As far as a venous examination goes she had declined to get her left leg studied and the right leg is also noted on 11/15/2016. On asking the patient about this she says she never told the technician not to do her left leg but it may have been a misunderstanding. The patient will be seeing the vascular surgeons soon and I have asked them to address whether the left leg needs a venous duplex study. 12/13/2016 -- was seen by Dr. Leotis Pain on 11/30/2016, who reviewed her  recent venous duplex study,a noninvasive arterial study, and after a thorough review has recommended to continue with compression stockings daily and if the venous ulceration recurs then he would consider her for form sclerotherapy of the incompetent accessory saphenous vein. 12/31/2016 -- the patient was recently discharged but comes back with a small open area on her medial foot which is in the region of previous varicosities which are under the care of Dr. Corene Cornea dew. The patient has been very compliant with wearing Delawder, Helyne T. ([id number]) her compression stockings. 01/14/2017 -- she has done very well and her wounds are now healed. She has still not gotten an appointment to see Dr. Lucky Cowboy and I have urged her to keep up with this Patient History Information obtained from Patient. Family History  Cancer - Siblings, Heart Disease - Father, Stroke - Mother, No family history of Diabetes, Hypertension, Kidney Disease, Lung Disease, Seizures, Thyroid Problems, Tuberculosis. Social History Never smoker, Marital Status - Married, Alcohol Use - Never, Drug Use - No History, Caffeine Use - Daily. 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 Respirations normal and unlabored. Afebrile. Vitals Time Taken: 9:48 AM, Height: 68 in, Weight: 191.1 lbs, BMI: 29.1, Temperature: 98.0 F, Pulse: 68 bpm, Respiratory Rate: 18 breaths/min, Blood Pressure: 144/63 mmHg. Eyes Nonicteric. Reactive to light. Ears, Nose, Mouth, and Throat Lips, teeth, and gums WNL.[name] Moist mucosa without lesions. Neck supple and nontender. No palpable supraclavicular or cervical adenopathy. Normal sized without goiter. Respiratory WNL. No retractions.. Cardiovascular Pedal Pulses WNL. No clubbing, cyanosis or edema. Gastrointestinal (GI) Abdomen without masses or tenderness.. No liver or spleen enlargement or tenderness.. Genitourinary  (GU) No hydrocele, spermatocele, tenderness of the cord, or testicular mass.[name] Penis without lesions.Lowella Fairy without lesions. No cystocele, or rectocele. Pelvic support intact, no discharge.[name] Urethra without masses, tenderness or scarring.[name] RENESMAE, DONAHEY T. ([id number]) Lymphatic No adneopathy. No adenopathy. No adenopathy. Musculoskeletal Adexa without tenderness or enlargement.. Digits and nails w/o clubbing, cyanosis, infection, petechiae, ischemia, or inflammatory conditions.[name] Psychiatric Judgement and insight Intact.. No evidence of depression, anxiety, or agitation.. General Notes: the right medial ankle area is looking very good and there is no evidence of any open ulceration. She continues to have stigmata of superficial venous hypertension. Integumentary (Hair, Skin) No suspicious lesions. No crepitus or fluctuance. No peri-wound warmth or erythema. No masses.. Wound #2 status is Healed - Epithelialized. Original cause of wound was Gradually Appeared. The wound is located on the Right,Medial Foot. The wound measures 0cm length x 0cm width x 0cm depth; 0cm^2 area and 0cm^3 volume. 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 has done very well and her wound has healed. she is still awaiting her vascular review and in the meanwhile, I have asked her to use foam, a light dressing over this and continue to use her compression stockings and we have gone over great details about elevation and exercise. She has had all questions answered and will come back to see [place] only if the need arises. she is discharged from the wound care services. Electronic Signature(s) JEANE, CASHATT ([id number]) Signed: 01/14/2017 10:12:35 AM By: Christin Fudge MD, FACS Entered By: Christin Fudge on 01/14/2017 10:12:35 MANDEEP, FERCH ([id number]) -------------------------------------------------------------------------------- ROS/PFSH Details Patient Name: [name], Joniece T. Date of Service: 01/14/2017 9:45 AM Medical Record Number: [id number] Patient Account Number: [id number] Date of Birth/Sex: 1940/12/06 (76 y.o. Female) Treating RN: Cornell Barman Primary Care Provider: Elsie Stain Other Clinician: Referring Provider: Elsie Stain Treating Provider/Extender: Frann Rider in Treatment: 11 Information Obtained From Patient Wound History Do you currently have one or more open woundso Yes How many open wounds do you currently haveo 1 Approximately how long have you had your woundso 3 weeks How have you been treating your wound(s) until nowo triple abx ointment Has your wound(s) ever healed and then re-openedo No Have you had any lab work done in the past montho No Have you tested positive for an antibiotic resistant organism (MRSA, VRE)o No Have you tested positive for osteomyelitis (bone infection)o No  Have you had any tests for circulation on your legso Yes Eyes Medical History: Negative for: Cataracts; Glaucoma; Optic Neuritis Ear/Nose/Mouth/Throat Medical History: Negative for: Chronic sinus problems/congestion; Middle ear problems Hematologic/Lymphatic Medical History: Negative for: Anemia; Hemophilia; Human Immunodeficiency Virus; Lymphedema; Sickle Cell Disease Respiratory Medical History: Negative for: Aspiration; Asthma; Chronic Obstructive Pulmonary Disease (COPD); Sleep Apnea; Tuberculosis Cardiovascular Medical History: Positive for: Hypertension - medication controlled Negative for: Angina; Arrhythmia; Congestive Heart Failure; Coronary Artery Disease; Deep Vein Thrombosis; Hypotension; Myocardial Infarction; Peripheral Arterial Disease; Peripheral Venous Disease; Phlebitis; Vasculitis Past Medical  History Notes: 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 ([id number]) Medical History: Negative for: Type I Diabetes; Type II Diabetes Genitourinary Medical History: Negative for: End Stage Renal Disease Immunological Medical History: Negative for: Lupus Erythematosus; Raynaudos Integumentary (Skin) Medical History: Negative for: History of Burn; History of pressure wounds Musculoskeletal Medical History: Negative for: Gout; Rheumatoid Arthritis; Osteoarthritis; Osteomyelitis Neurologic Medical History: Negative for: Dementia; Neuropathy; Quadriplegia; Paraplegia; Seizure Disorder Oncologic Medical History: Negative for: Received Chemotherapy; Received Radiation Psychiatric Medical History: Negative for: Anorexia/bulimia; Confinement Anxiety Immunizations Pneumococcal Vaccine: Received Pneumococcal Vaccination: Yes Implantable Devices Family and Social History Cancer: Yes - Siblings; Diabetes: No; Heart Disease: Yes - Father; Hypertension: No; Kidney Disease: No; Lung Disease: No; Seizures: No; Stroke: Yes - Mother; Thyroid Problems: No; Tuberculosis: No; Never smoker; Marital Status - Married; Alcohol Use: Never; Drug Use: No History; Caffeine Use: Daily; Advanced Directives: No; Patient does not want information on Advanced Directives; Do not resuscitate: No; Living Will: No; Medical Power of Attorney: No Physician Affirmation I have reviewed and agree with the above information. Electronic Signature(s) Signed: 01/14/2017 1:42:42 PM By: Christin Fudge MD, FACS Signed: 01/14/2017 5:14:22 PM By: Gretta Cool BSN, RN, CWS, Kim RN, BSN Entered By: Christin Fudge on 01/14/2017 10:10:32 SHYRA, EMILE ([id number]) HANEEN, BERNALES ([id number]) -------------------------------------------------------------------------------- SuperBill  Details Patient Name: [name], Sharayah T. Date of Service: 01/14/2017 Medical Record Number: [id number] Patient Account Number: [id number] Date of Birth/Sex: 1940-11-03 (76 y.o. 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 Facility Procedures CPT4 Code: [id number] Description: [phone] - WOUND CARE VISIT-LEV 2 EST PT Modifier: Quantity: 1 Physician Procedures CPT4 Code: [id number] Description: 08144 - WC PHYS LEVEL 3 - EST PT ICD-10 Diagnosis Description Y18.563 Non-pressure chronic ulcer of right ankle with fat layer e I83.013 Varicose veins of right lower extremity with ulcer of ankl Modifier: xposed e Quantity: 1 Electronic Signature(s) Signed: 01/14/2017 10:23:47 AM By: Montey Hora Signed: 01/14/2017 1:42:42 PM By: Christin Fudge MD, FACS Previous Signature: 01/14/2017 10:12:49 AM Version By: Christin Fudge MD, FACS Entered By: Montey Hora on 01/14/2017 10:23:46

## 2017-01-15 NOTE — Progress Notes (Signed)
DODY, SMARTT ([id number]) Visit Report for 01/14/2017 Arrival Information Details Patient Name: [name], [name]. Date of Service: 01/14/2017 9:45 AM Medical Record Number: [id number] Patient Account Number: [id number] Date of Birth/Sex: January 09, 1941 (76 y.o. Female) Treating RN: Montey Hora Primary Care Habiba Treloar: Elsie Stain Other Clinician: Referring Chizara Mena: Elsie Stain Treating Pieter Fooks/Extender: Frann Rider in Treatment: 11 Visit Information History Since Last Visit Added or deleted any medications: No Patient Arrived: Ambulatory Any new allergies or adverse reactions: No Arrival Time: 09:46 Had a fall or experienced change in No Accompanied By: dtr activities of daily living that may affect Transfer Assistance: None risk of falls: Patient Identification Verified: Yes Signs or symptoms of abuse/neglect since last visito No Secondary Verification Process Yes Hospitalized since last visit: No Completed: Has Dressing in Place as Prescribed: Yes Patient Requires Transmission-Based No Pain Present Now: No Precautions: Patient Has Alerts: Yes Patient Alerts: Patient on Blood Thinner 81 MG Aspirin NOT DiaBETIC Electronic Signature(s) Signed: 01/14/2017 4:57:04 PM By: Montey Hora Entered By: Montey Hora on 01/14/2017 09:47:40 Bitterman, Lanell T. ([id number]) -------------------------------------------------------------------------------- Clinic Level of Care Assessment Details Patient Name: Kim Fallen T. Date of Service: 01/14/2017 9:45 AM Medical Record Number: [id number] Patient Account Number: [id number] Date of Birth/Sex: [birth year] (76 y.o. Female) Treating RN: Montey Hora Primary Care Belva Koziel: Elsie Stain Other Clinician: Referring Arvie Bartholomew: Elsie Stain Treating Nerida Boivin/Extender: Frann Rider in Treatment: 11 Clinic Level of Care Assessment Items TOOL 4 Quantity Score []  - Use when only an EandM is performed on  FOLLOW-UP visit 0 ASSESSMENTS - Nursing Assessment / Reassessment X - Reassessment of Co-morbidities (includes updates in patient status) 1 10 X- 1 5 Reassessment of Adherence to Treatment Plan ASSESSMENTS - Wound and Skin Assessment / Reassessment X - Simple Wound Assessment / Reassessment - one wound 1 5 []  - 0 Complex Wound Assessment / Reassessment - multiple wounds []  - 0 Dermatologic / Skin Assessment (not related to wound area) ASSESSMENTS - Focused Assessment []  - Circumferential Edema Measurements - multi extremities 0 []  - 0 Nutritional Assessment / Counseling / Intervention X- 1 5 Lower Extremity Assessment (monofilament, tuning fork, pulses) []  - 0 Peripheral Arterial Disease Assessment (using hand held doppler) ASSESSMENTS - Ostomy and/or Continence Assessment and Care []  - Incontinence Assessment and Management 0 []  - 0 Ostomy Care Assessment and Management (repouching, etc.) PROCESS - Coordination of Care X - Simple Patient / Family Education for ongoing care 1 15 []  - 0 Complex (extensive) Patient / Family Education for ongoing care []  - 0 Staff obtains Programmer, systems, Records, Test Results / Process Orders []  - 0 Staff telephones HHA, Nursing Homes / Clarify orders / etc []  - 0 Routine Transfer to another Facility (non-emergent condition) []  - 0 Routine Hospital Admission (non-emergent condition) []  - 0 New Admissions / Biomedical engineer / Ordering NPWT, Apligraf, etc. []  - 0 Emergency Hospital Admission (emergent condition) X- 1 10 Simple Discharge Coordination Starry, Josue T. ([id number]) []  - 0 Complex (extensive) Discharge Coordination PROCESS - Special Needs []  - Pediatric / Minor Patient Management 0 []  - 0 Isolation Patient Management []  - 0 Hearing / Language / Visual special needs []  - 0 Assessment of Community assistance (transportation, D/C planning, etc.) []  - 0 Additional assistance / Altered mentation []  - 0 Support Surface(s)  Assessment (bed, cushion, seat, etc.) INTERVENTIONS - Wound Cleansing / Measurement X - Simple Wound Cleansing - one wound 1 5 []  - 0 Complex Wound Cleansing - multiple wounds X- 1 5 Wound Imaging (photographs -  any number of wounds) []  - 0 Wound Tracing (instead of photographs) X- 1 5 Simple Wound Measurement - one wound []  - 0 Complex Wound Measurement - multiple wounds INTERVENTIONS - Wound Dressings []  - Small Wound Dressing one or multiple wounds 0 []  - 0 Medium Wound Dressing one or multiple wounds []  - 0 Large Wound Dressing one or multiple wounds []  - 0 Application of Medications - topical []  - 0 Application of Medications - injection INTERVENTIONS - Miscellaneous []  - External ear exam 0 []  - 0 Specimen Collection (cultures, biopsies, blood, body fluids, etc.) []  - 0 Specimen(s) / Culture(s) sent or taken to Lab for analysis []  - 0 Patient Transfer (multiple staff / Civil Service fast streamer / Similar devices) []  - 0 Simple Staple / Suture removal (25 or less) []  - 0 Complex Staple / Suture removal (26 or more) []  - 0 Hypo / Hyperglycemic Management (close monitor of Blood Glucose) []  - 0 Ankle / Brachial Index (ABI) - do not check if billed separately X- 1 5 Vital Signs Hunnicutt, Adilynne T. ([id number]) Has the patient been seen at the hospital within the last three years: Yes Total Score: 70 Level Of Care: New/Established - Level 2 Electronic Signature(s) Signed: 01/14/2017 4:57:04 PM By: Montey Hora Entered By: Montey Hora on 01/14/2017 10:23:38 Fielding, Caren Macadam ([id number]) -------------------------------------------------------------------------------- Encounter Discharge Information Details Patient Name: [name], [name] Date of Service: 01/14/2017 9:45 AM Medical Record Number: [id number] Patient Account Number: [id number] Date of Birth/Sex: 07/01/1940 (76 y.o. Female) Treating RN: Cornell Barman Primary Care Karam Dunson: Elsie Stain Other  Clinician: Referring Kabe Mckoy: Elsie Stain Treating Jennah Satchell/Extender: Frann Rider in Treatment: 79 Encounter Discharge Information Items Discharge Pain Level: 0 Discharge Condition: Stable Ambulatory Status: Ambulatory Discharge Destination: Home Transportation: Private Auto Accompanied By: dtr Schedule Follow-up Appointment: No Medication Reconciliation completed and No provided to Patient/Care Britton Bera: Provided on Clinical Summary of Care: 01/14/2017 Form Type Recipient Paper Patient [facility] Electronic Signature(s) Signed: 01/14/2017 10:24:05 AM By: Montey Hora Entered By: Montey Hora on 01/14/2017 10:24:05 Beadle, Caren Macadam ([id number]) -------------------------------------------------------------------------------- Lower Extremity Assessment Details Patient Name: Wycoff, Jamonica T. Date of Service: 01/14/2017 9:45 AM Medical Record Number: [id number] Patient Account Number: [id number] Date of Birth/Sex: January 20, 1940 (76 y.o. Female) Treating RN: Montey Hora Primary Care Jennafer Gladue: Elsie Stain Other Clinician: Referring Amando Chaput: Elsie Stain Treating Abbigail Anstey/Extender: Frann Rider in Treatment: 11 Edema Assessment Assessed: [Left: No] [Right: No] Edema: [Left: N] [Right: o] Vascular Assessment Pulses: Dorsalis Pedis Palpable: [Right:Yes] Posterior Tibial Extremity colors, hair growth, and conditions: Extremity Color: [Right:Mottled] Hair Growth on Extremity: [Right:No] Temperature of Extremity: [Right:Warm] Capillary Refill: [Right:< 3 seconds] Toe Nail Assessment Left: Right: Thick: Yes Discolored: No Deformed: No Improper Length and Hygiene: No Electronic Signature(s) Signed: 01/14/2017 4:57:04 PM By: Montey Hora Entered By: Montey Hora on 01/14/2017 09:53:10 Lurie, Kaysha T. ([id number]) -------------------------------------------------------------------------------- Multi Wound Chart Details Patient Name: Dassow,  Ayeshia T. Date of Service: 01/14/2017 9:45 AM Medical Record Number: [id number] Patient Account Number: [id number] Date of Birth/Sex: 06-Feb-1940 (76 y.o. Female) Treating RN: Cornell Barman Primary Care Renan Danese: Elsie Stain Other Clinician: Referring Delanda Bulluck: Elsie Stain Treating Erryn Dickison/Extender: Frann Rider in Treatment: 11 Vital Signs Height(in): 68 Pulse(bpm): 90 Weight(lbs): 191.1 Blood Pressure(mmHg): 144/63 Body Mass Index(BMI): 29 Temperature(F): 98.0 Respiratory Rate 18 (breaths/min): Photos: [2:No Photos] [N/A:N/A] Wound Location: [2:Right, Medial Foot] [N/A:N/A] Wounding Event: [2:Gradually Appeared] [N/A:N/A] Primary Etiology: [2:Vasculopathy] [N/A:N/A] Date Acquired: [2:12/23/2016] [N/A:N/A] Weeks of Treatment: [2:2] [N/A:N/A] Wound Status: [2:Healed - Epithelialized] [N/A:N/A] Measurements L x W  x D [2:0x0x0] [N/A:N/A] (cm) Area (cm) : [2:0] [N/A:N/A] Volume (cm) : [2:0] [N/A:N/A] % Reduction in Area: [2:100.00%] [N/A:N/A] % Reduction in Volume: [2:100.00%] [N/A:N/A] Classification: [2:Partial Thickness] [N/A:N/A] Periwound Skin Texture: [2:No Abnormalities Noted] [N/A:N/A] Periwound Skin Moisture: [2:No Abnormalities Noted] [N/A:N/A] Periwound Skin Color: [2:No Abnormalities Noted No] [N/A:N/A N/A] Treatment Notes Electronic Signature(s) Signed: 01/14/2017 10:08:57 AM By: Christin Fudge MD, FACS Entered By: Christin Fudge on 01/14/2017 10:08:57 [name] ([id number]) -------------------------------------------------------------------------------- [place] Details Patient Name: Kim Fallen T. Date of Service: 01/14/2017 9:45 AM Medical Record Number: [id number] Patient Account Number: [id number] Date of Birth/Sex: 02-28-1940 (76 y.o. Female) Treating RN: Montey Hora Primary Care Muscab Brenneman: Elsie Stain Other Clinician: Referring Ailea Rhatigan: Elsie Stain Treating Laretta Pyatt/Extender: Frann Rider in  Treatment: 11 Active Inactive Electronic Signature(s) Signed: 01/14/2017 4:57:04 PM By: Montey Hora Entered By: Montey Hora on 01/14/2017 10:03:10 Fleer, Caren Macadam ([id number]) -------------------------------------------------------------------------------- Pain Assessment Details Patient Name: Molstad, Rajvi T. Date of Service: 01/14/2017 9:45 AM Medical Record Number: [id number] Patient Account Number: [id number] Date of Birth/Sex: [birth year] (76 y.o. Female) Treating RN: Montey Hora Primary Care Rambo Sarafian: Elsie Stain Other Clinician: Referring Parthena Fergeson: Elsie Stain Treating Waino Mounsey/Extender: Frann Rider in Treatment: 11 Active Problems Location of Pain Severity and Description of Pain Patient Has Paino No Site Locations Pain Management and Medication Current Pain Management: Notes Topical or injectable lidocaine is offered to patient for acute pain when surgical debridement is performed. If needed, Patient is instructed to use over the counter pain medication for the following 24-48 hours after debridement. Wound care MDs do not prescribed pain medications. Patient has chronic pain or uncontrolled pain. Patient has been instructed to make an appointment with their Primary Care Physician for pain management. Electronic Signature(s) Signed: 01/14/2017 4:57:04 PM By: Montey Hora Entered By: Montey Hora on 01/14/2017 09:48:20 Millea, Malgorzata Darene Lamer ([id number]) -------------------------------------------------------------------------------- Patient/Caregiver Education Details Patient Name: Kim Fallen T. Date of Service: 01/14/2017 9:45 AM Medical Record Number: [id number] Patient Account Number: [id number] Date of Birth/Gender: 03/18/1940 (76 y.o. Female) Treating RN: Montey Hora Primary Care Physician: Elsie Stain Other Clinician: Referring Physician: Elsie Stain Treating Physician/Extender: Frann Rider in Treatment:  11 Education Assessment Education Provided To: Patient Education Topics Provided Venous: Handouts: Other: wear compression hose every day Methods: Explain/Verbal Responses: State content correctly Electronic Signature(s) Signed: 01/14/2017 4:57:04 PM By: Montey Hora Entered By: Montey Hora on 01/14/2017 10:24:24 Bohanon, Unity T. ([id number]) -------------------------------------------------------------------------------- Wound Assessment Details Patient Name: Spoonemore, Jerine T. Date of Service: 01/14/2017 9:45 AM Medical Record Number: [id number] Patient Account Number: [id number] Date of Birth/Sex: Dec 03, 1940 (76 y.o. Female) Treating RN: Montey Hora Primary Care Kashira Behunin: Elsie Stain Other Clinician: Referring Ramsay Bognar: Elsie Stain Treating Verla Bryngelson/Extender: Frann Rider in Treatment: 11 Wound Status Wound Number: 2 Primary Etiology: Vasculopathy Wound Location: Right, Medial Foot Wound Status: Healed - Epithelialized Wounding Event: Gradually Appeared Date Acquired: 12/23/2016 Weeks Of Treatment: 2 Clustered Wound: No Wound Measurements Length: (cm) 0 Width: (cm) 0 Depth: (cm) 0 Area: (cm) 0 Volume: (cm) 0 % Reduction in Area: 100% % Reduction in Volume: 100% Wound Description Classification: Partial Thickness Periwound Skin Texture Texture Color No Abnormalities Noted: No No Abnormalities Noted: No Moisture No Abnormalities Noted: No Electronic Signature(s) Signed: 01/14/2017 4:57:04 PM By: Montey Hora Entered By: Montey Hora on 01/14/2017 10:02:34 Bazzle, Finola T. ([id number]) -------------------------------------------------------------------------------- Vitals Details Patient Name: [name], Manna T. Date of Service: 01/14/2017 9:45 AM Medical Record Number: [id number] Patient Account Number: [id number] Date of Birth/Sex: 22-May-1940 (76 y.o. Female) Treating  RN: Montey Hora Primary Care Cassadee Vanzandt: Elsie Stain  Other Clinician: Referring Jerimiah Wolman: Elsie Stain Treating Izael Bessinger/Extender: Frann Rider in Treatment: 11 Vital Signs Time Taken: 09:48 Temperature (F): 98.0 Height (in): 68 Pulse (bpm): 68 Weight (lbs): 191.1 Respiratory Rate (breaths/min): 18 Body Mass Index (BMI): 29.1 Blood Pressure (mmHg): 144/63 Reference Range: 80 - 120 mg / dl Electronic Signature(s) Signed: 01/14/2017 4:57:04 PM By: Montey Hora Entered By: Montey Hora on 01/14/2017 09:48:53

## 2017-02-04 ENCOUNTER — Ambulatory Visit (INDEPENDENT_AMBULATORY_CARE_PROVIDER_SITE_OTHER): Payer: Medicare Other | Admitting: Vascular Surgery

## 2017-02-04 ENCOUNTER — Encounter (INDEPENDENT_AMBULATORY_CARE_PROVIDER_SITE_OTHER): Payer: Self-pay | Admitting: Vascular Surgery

## 2017-02-04 VITALS — BP 137/71 | HR 80 | Resp 16 | Wt 189.8 lb

## 2017-02-04 DIAGNOSIS — I83019 Varicose veins of right lower extremity with ulcer of unspecified site: Secondary | ICD-10-CM

## 2017-02-04 DIAGNOSIS — I10 Essential (primary) hypertension: Secondary | ICD-10-CM | POA: Diagnosis not present

## 2017-02-04 DIAGNOSIS — E785 Hyperlipidemia, unspecified: Secondary | ICD-10-CM

## 2017-02-04 DIAGNOSIS — L97919 Non-pressure chronic ulcer of unspecified part of right lower leg with unspecified severity: Secondary | ICD-10-CM | POA: Diagnosis not present

## 2017-02-04 NOTE — Progress Notes (Signed)
MRN : [id number]  [name] is a 77 y.o. ([birth year]) female who presents with chief complaint of  Chief Complaint  Patient presents with  . Follow-up    laser ablation questions  .  History of Present Illness: Patient returns today in follow up of her venous insufficiency.  She has had another right leg venous ulceration which has healed since her last visit a little over 2 months ago.  A previously performed right lower extremity venous duplex demonstrated a large anterior accessory saphenous vein with reflux and an absent great saphenous vein throughout most of the leg.   Past Medical History:  Diagnosis Date  . Back pain   . Endometriosis   . Hypercholesteremia   . Hypothyroidism   . Migraine   . Venous stasis ulcer ([place]) 11/1997   Wound care center, [place]         Past Surgical History:  Procedure Laterality Date  . ABDOMINAL HYSTERECTOMY     1 1/2 ovaries gone, second to endometriosis  . APPENDECTOMY    . CYSTOSCOPY    . HEMORRHOID SURGERY    . HERNIA REPAIR    . ROTATOR CUFF REPAIR    . THYROIDECTOMY           Family History  Problem Relation Age of Onset  . Stroke Mother   . Goiter Mother   . Stomach cancer Sister   . Heart attack Father   . Heart disease Father   . Heart attack Brother   . Kidney disease Brother   . Colon cancer Neg Hx   . Esophageal cancer Neg Hx      Social History Social History        Tobacco Use  . Smoking status: Never Smoker  . Smokeless tobacco: Never Used  Substance Use Topics  . Alcohol use: No    Alcohol/week: 0.0 oz  . Drug use: No          Allergies  Allergen Reactions  . Codeine     REACTION: NAUSEA AND VOMITING  . Tramadol     Vomiting, LOC          Current Outpatient Medications  Medication Sig Dispense Refill  . aspirin EC 81 MG tablet Take 81 mg by mouth daily.    [name] atenolol (TENORMIN) 50 MG tablet Take 1 tablet (50 mg total) by  mouth daily. 90 tablet 3  . butalbital-acetaminophen-caffeine (FIORICET, ESGIC) 50-325-40 MG tablet TAKE ONE TABLET BY MOUTH TWICE A DAY AS NEEDED FOR HEADACHE 60 tablet 0  . Cholecalciferol (VITAMIN D-3) 1000 UNITS CAPS Take 1 capsule by mouth daily.    [name] doxycycline (VIBRA-TABS) 100 MG tablet Take 1 tablet (100 mg total) by mouth 2 (two) times daily. 20 tablet 0  . ibuprofen (ADVIL,MOTRIN) 800 MG tablet TAKE 1 TABLET BY MOUTH EVERY 4 TO 6 HOURS AS NEEDED 180 tablet 3  . levothyroxine (SYNTHROID, LEVOTHROID) 100 MCG tablet TAKE 1 TABLET (100 MCG TOTAL) BY MOUTH DAILY BEFORE BREAKFAST. 90 tablet 3  . Multiple Vitamin (MULTIVITAMIN) tablet Take 1 tablet by mouth daily.      [name] omeprazole (PRILOSEC) 20 MG capsule Take 1 capsule (20 mg total) by mouth daily. 90 capsule 3  . simvastatin (ZOCOR) 40 MG tablet Take 1 tablet (40 mg total) by mouth at bedtime. 90 tablet 3  . SUMAtriptan (IMITREX) 20 MG/ACT nasal spray Place 1 spray into the nose as needed.       No current  facility-administered medications for this visit.       REVIEW OF SYSTEMS (Negative unless checked)  Constitutional: [] Weight loss  [] Fever  [] Chills Cardiac: [] Chest pain   [] Chest pressure   [] Palpitations   [] Shortness of breath when laying flat   [] Shortness of breath at rest   [] Shortness of breath with exertion. Vascular:  [] Pain in legs with walking   [] Pain in legs at rest   [] Pain in legs when laying flat   [] Claudication   [] Pain in feet when walking  [] Pain in feet at rest  [] Pain in feet when laying flat   [] History of DVT   [] Phlebitis   [x] Swelling in legs   [] Varicose veins   [x] Non-healing ulcers Pulmonary:   [] Uses home oxygen   [] Productive cough   [] Hemoptysis   [] Wheeze  [] COPD   [] Asthma Neurologic:  [] Dizziness  [] Blackouts   [] Seizures   [] History of stroke   [] History of TIA  [] Aphasia   [] Temporary blindness   [] Dysphagia   [] Weakness or numbness in arms   [] Weakness or numbness in  legs Musculoskeletal:  [] Arthritis   [] Joint swelling   [] Joint pain   [] Low back pain Hematologic:  [] Easy bruising  [] Easy bleeding   [] Hypercoagulable state   [] Anemic  [] Hepatitis Gastrointestinal:  [] Blood in stool   [] Vomiting blood  [] Gastroesophageal reflux/heartburn   [] Abdominal pain Genitourinary:  [] Chronic kidney disease   [] Difficult urination  [] Frequent urination  [] Burning with urination   [] Hematuria Skin:  [] Rashes   [x] Ulcers   [x] Wounds Psychological:  [] History of anxiety   []  History of major depression.     Physical Examination  BP 137/71 (BP Location: Right Arm)   Pulse 80   Resp 16   Wt 86.1 kg (189 lb 12.8 oz)   BMI 28.86 kg/m  Gen:  WD/WN, NAD Head: [place]/AT, No temporalis wasting. Ear/Nose/Throat: Hearing grossly intact, nares w/o erythema or drainage, trachea midline Eyes: Conjunctiva clear. Sclera non-icteric Neck: Supple.  No JVD.  Pulmonary:  Good air movement, no use of accessory muscles.  Cardiac: RRR, normal S1, S2 Vascular:  Vessel Right Left  Radial Palpable Palpable                          PT Palpable Palpable  DP Palpable Palpable   Gastrointestinal: soft, non-tender/non-distended. No guarding/reflex.  Musculoskeletal: M/S 5/5 throughout.  No deformity or atrophy.  Trace right lower extremity edema.  Prominent stasis dermatitis with markedly enlarged varicosities measuring up to 3-4 mm in diameter in the right leg Neurologic: Sensation grossly intact in extremities.  Symmetrical.  Speech is fluent.  Psychiatric: Judgment intact, Mood & affect appropriate for pt's clinical situation. Dermatologic: Healed ulceration on the right medial ankle    Labs No results found for this or any previous visit (from the past 2160 hour(s)).  Radiology No results found.   Assessment/Plan  HTN (hypertension) blood pressure control important in reducing the progression of atherosclerotic disease. On appropriate oral medications.   HLD  (hyperlipidemia) lipid control important in reducing the progression of atherosclerotic disease. Continue statin therapy   Varicose veins with ulcer, right ([place]) The patient has had 2 previous venous stasis ulcerations on the right lower past several months. A previously performed right lower extremity venous duplex demonstrated a large anterior accessory saphenous vein with reflux and an absent great saphenous vein throughout most of the leg. Given this, she would benefit from foam sclerotherapy of the right anterior accessory saphenous  vein to remove venous branches and decrease the venous pressure.  Risks and benefits of foam sclerotherapy were discussed and she desires to proceed.    Leotis Pain, MD  02/05/2017 1:19 PM    This note was created with Dragon medical transcription system.  Any errors from dictation are purely unintentional

## 2017-02-05 NOTE — Assessment & Plan Note (Signed)
lipid control important in reducing the progression of atherosclerotic disease. Continue statin therapy  

## 2017-02-05 NOTE — Assessment & Plan Note (Signed)
The patient has had 2 previous venous stasis ulcerations on the right lower past several months. A previously performed right lower extremity venous duplex demonstrated a large anterior accessory saphenous vein with reflux and an absent great saphenous vein throughout most of the leg. Given this, she would benefit from foam sclerotherapy of the right anterior accessory saphenous vein to remove venous branches and decrease the venous pressure.  Risks and benefits of foam sclerotherapy were discussed and she desires to proceed.

## 2017-02-05 NOTE — Assessment & Plan Note (Signed)
blood pressure control important in reducing the progression of atherosclerotic disease. On appropriate oral medications.  

## 2017-02-05 NOTE — Patient Instructions (Signed)
Sclerotherapy, Care After This sheet gives you information about how to care for yourself after your procedure. Your health care provider may also give you more specific instructions. If you have problems or questions, contact your health care provider. What can I expect after the procedure? After the procedure, it is common to have:  Swelling.  Bruising.  Soreness.  Mild skin discoloration.  Slight bleeding from an injection site.  Follow these instructions at home: Injection area care  Follow instructions from your health care provider about how to take care of your injection area. Make sure you: ? Wash your hands with soap and water before you change your bandage (dressing). If soap and water are not available, use hand sanitizer. ? Change your dressing as told by your health care provider.  Check your injection area every day for signs of infection. Check for: ? Redness, swelling, or pain. ? Fluid or blood. ? Warmth. ? Pus or a bad smell. Activity  Do light exercise every day, as told by your health care provider. Walking or riding a stationary bike may be good options for you.  Return to your normal activities as told by your health care provider. Ask your health care provider what activities are safe for you.  Do not drive until your health care provider approves. Ask your health care provider when is safe for you to drive. You may need to wait 1-2 days before driving. General instructions  Take over-the-counter and prescription medicines only as told by your health care provider.  Do not use lotions or creams on your legs unless your health care provider approves.  Do not use any products that contain nicotine or tobacco, such as cigarettes and e-cigarettes. If you need help quitting, ask your health care provider.  Do not take baths or showers, swim, or use a hot tub until your health care provider approves. You may need to take sponge baths until 1-2 days after the  procedure.  Wear compression stockings for a week, or as long as your health care provider recommends. These stockings help to prevent blood clots and reduce swelling in your legs.  Wear loose-fitting clothing on the treatment area.  Keep all follow-up visits as told by your health care provider. This is important. Contact a health care provider if:  You have redness, swelling, or pain around an injection site.  You have fluid or blood coming from an injection site.  An injection area feels warm to the touch.  You have pus or a bad smell coming from an injection site.  You have a fever. Get help right away if:  You faint.  You have severe pain.  You have leg pain that gets worse when you walk.  You have redness or swelling in your leg that is getting worse.  You have trouble breathing.  You cough up blood. Summary  Swelling, bruising, and soreness are common after this procedure.  Check your injection area every day for signs of infection.  Make sure you wear your compression stockings as told by your health care provider. These stockings help to prevent blood clots and reduce swelling in your legs. This information is not intended to replace advice given to you by your health care provider. Make sure you discuss any questions you have with your health care provider. Document Released: 02/24/2016 Document Revised: 02/24/2016 Document Reviewed: 02/24/2016 Elsevier Interactive Patient Education  2018 Elsevier Inc.  

## 2017-03-08 DIAGNOSIS — D1801 Hemangioma of skin and subcutaneous tissue: Secondary | ICD-10-CM | POA: Diagnosis not present

## 2017-03-08 DIAGNOSIS — L723 Sebaceous cyst: Secondary | ICD-10-CM | POA: Diagnosis not present

## 2017-03-08 DIAGNOSIS — L821 Other seborrheic keratosis: Secondary | ICD-10-CM | POA: Diagnosis not present

## 2017-03-08 DIAGNOSIS — Z85828 Personal history of other malignant neoplasm of skin: Secondary | ICD-10-CM | POA: Diagnosis not present

## 2017-03-10 ENCOUNTER — Ambulatory Visit (INDEPENDENT_AMBULATORY_CARE_PROVIDER_SITE_OTHER): Payer: Medicare Other | Admitting: Family Medicine

## 2017-03-10 ENCOUNTER — Encounter: Payer: Self-pay | Admitting: Family Medicine

## 2017-03-10 DIAGNOSIS — M79622 Pain in left upper arm: Secondary | ICD-10-CM | POA: Diagnosis not present

## 2017-03-10 DIAGNOSIS — G4700 Insomnia, unspecified: Secondary | ICD-10-CM

## 2017-03-10 NOTE — Patient Instructions (Signed)
Try taking melatonin at night for sleep.  If that doesn't work then try unisom.  Update me as needed.  If the armpit pain continues then let me know. I think you pulled a muscle.  I don't feel anything alarming in the area.  Take care.  Glad to see you.

## 2017-03-10 NOTE — Progress Notes (Signed)
L armpit pain.  Doing on about 1 week, comes and goes.  Sore locally.  No sx now.  Can has sx at rest.  No pain with lifting prev but she has been doing a lot of repeated lifting in general.  No pain with getting up and walking around.  Seems to happen at rest, sometimes with L arm ROM.  No skin changes locally.  No R sided sx.  She can still take a good deep breath.    She is not sleeping well.  She is worried about her husband, with his work situation.  She admits to being really anxious and having nightmares.  No SI/HI.   Meds, vitals, and allergies reviewed.   ROS: Per HPI unless specifically indicated in ROS section   GEN: nad, alert and oriented HEENT: mucous membranes moist NECK: supple w/o LA CV: rrr. PULM: ctab, no inc wob ABD: soft, +bs EXT: no edema L axilla wnl, no supraclavicular or axillary LA or mass.

## 2017-03-13 DIAGNOSIS — M79629 Pain in unspecified upper arm: Secondary | ICD-10-CM | POA: Insufficient documentation

## 2017-03-13 DIAGNOSIS — G4700 Insomnia, unspecified: Secondary | ICD-10-CM | POA: Insufficient documentation

## 2017-03-13 NOTE — Assessment & Plan Note (Signed)
Can try taking melatonin at night for sleep.  If that doesn't work then try unisom.  Update me as needed.

## 2017-03-13 NOTE — Assessment & Plan Note (Addendum)
If the armpit pain continues then she'll let me know. I think she pulled a muscle.  I don't feel anything alarming in the area.  No red flag sx.  D/w pt.  She agrees.

## 2017-03-21 ENCOUNTER — Other Ambulatory Visit: Payer: Self-pay | Admitting: Family Medicine

## 2017-03-22 ENCOUNTER — Telehealth: Payer: Self-pay | Admitting: Family Medicine

## 2017-03-22 NOTE — Telephone Encounter (Signed)
Copied from [place] [id number]. Topic: Quick Communication - Rx Refill/Question >> Mar 22, 2017  3:57 PM Boyd Kerbs wrote:  Medication: benzonatate (TESSALON) 200 MG capsule    Has the patient contacted their pharmacy? Yes.   This prescription was sent to Dr. Deborra Medina- and Dr. Damita Dunnings is her PCP   (Agent: If no, request that the patient contact the pharmacy for the refill.)   Preferred Pharmacy (with phone number or street name):   [facility] [place] [facility] [address] [place] [address] Phone: [phone] Fax: [phone]   Agent: Please be advised that RX refills may take up to 3 business days. We ask that you follow-up with your pharmacy.

## 2017-03-23 ENCOUNTER — Ambulatory Visit: Payer: Self-pay

## 2017-03-23 MED ORDER — BENZONATATE 200 MG PO CAPS: 200.0000 mg | ORAL_CAPSULE | Freq: Three times a day (TID) | ORAL | 0 refills | Status: DC | PRN

## 2017-03-23 NOTE — Telephone Encounter (Signed)
Patient notified as instructed by telephone and verbalized understanding. 

## 2017-03-23 NOTE — Telephone Encounter (Signed)
rx sent.  Update us as needed.  Thanks.  

## 2017-03-23 NOTE — Telephone Encounter (Signed)
See Triage note of 03/23/17

## 2017-03-23 NOTE — Telephone Encounter (Signed)
Returned call to pt.  Requested an Rx for Benzonatate for cough.  Questioned about symptoms.  Stated had onset of cough on Monday; reported she is coughing up a green phlegm at intervals.  Reported also has runny nose.  Denied shortness of breath, chest tightness, fever/ chills, sore throat, or muscle aches.  Stated she woke up with "mild headache"; denied facial pain/ sinus pressure.  Took Tylenol this morning and the headache has subsided.   Reported she is taking her husband's Benzonatate right now, as he is just getting over this same thing, and had a prescription for it.  Care advice given per protocol.  Advised will make Dr. Damita Dunnings aware of her request.  Encouraged to call back if her symptoms worsen; ie: fever lasting longer than 3 days, shortness of breath, worsening cough/ congestion, or any other concerns.  Verb. Understanding.  Agrees with plan.        Reason for Disposition . Cough with cold symptoms (e.g., runny nose, postnasal drip, throat clearing)  Answer Assessment - Initial Assessment Questions 1. ONSET: "When did the cough begin?"      Monday, 03/21/17 2. SEVERITY: "How bad is the cough today?"      Mild; intermittent;  3. RESPIRATORY DISTRESS: "Describe your breathing."      No shortness of breath 4. FEVER: "Do you have a fever?" If so, ask: "What is your temperature, how was it measured, and when did it start?"     Denied  5. SPUTUM: "Describe the color of your sputum" (clear, white, yellow, green)     green 6. HEMOPTYSIS: "Are you coughing up any blood?" If so ask: "How much?" (flecks, streaks, tablespoons, etc.)     No  7. CARDIAC HISTORY: "Do you have any history of heart disease?" (e.g., heart attack, congestive heart failure)      no 8. LUNG HISTORY: "Do you have any history of lung disease?"  (e.g., pulmonary embolus, asthma, emphysema)     denied 9. PE RISK FACTORS: "Do you have a history of blood clots?" (or: recent major surgery, recent prolonged travel, bedridden  )     n/a 10. OTHER SYMPTOMS: "Do you have any other symptoms?" (e.g., runny nose, wheezing, chest pain)       Has runny nose; denied sinus pressure; c/o mild headache; denied chest tightness or wheezing 11. PREGNANCY: "Is there any chance you are pregnant?" "When was your last menstrual period?"       No  12. TRAVEL: "Have you traveled out of the country in the last month?" (e.g., travel history, exposures)       No  Protocols used: [place]

## 2017-03-28 DIAGNOSIS — H35373 Puckering of macula, bilateral: Secondary | ICD-10-CM | POA: Diagnosis not present

## 2017-03-28 DIAGNOSIS — H353132 Nonexudative age-related macular degeneration, bilateral, intermediate dry stage: Secondary | ICD-10-CM | POA: Diagnosis not present

## 2017-04-12 ENCOUNTER — Encounter: Payer: Self-pay | Admitting: Family Medicine

## 2017-04-12 ENCOUNTER — Encounter (INDEPENDENT_AMBULATORY_CARE_PROVIDER_SITE_OTHER): Payer: Self-pay | Admitting: Vascular Surgery

## 2017-04-12 ENCOUNTER — Ambulatory Visit (INDEPENDENT_AMBULATORY_CARE_PROVIDER_SITE_OTHER): Payer: Medicare Other | Admitting: Family Medicine

## 2017-04-12 ENCOUNTER — Ambulatory Visit (INDEPENDENT_AMBULATORY_CARE_PROVIDER_SITE_OTHER): Payer: Medicare Other | Admitting: Vascular Surgery

## 2017-04-12 VITALS — BP 145/73 | HR 72 | Resp 18 | Ht 68.0 in | Wt 193.0 lb

## 2017-04-12 DIAGNOSIS — I83019 Varicose veins of right lower extremity with ulcer of unspecified site: Secondary | ICD-10-CM

## 2017-04-12 DIAGNOSIS — I10 Essential (primary) hypertension: Secondary | ICD-10-CM | POA: Diagnosis not present

## 2017-04-12 DIAGNOSIS — I83018 Varicose veins of right lower extremity with ulcer other part of lower leg: Secondary | ICD-10-CM

## 2017-04-12 DIAGNOSIS — G43909 Migraine, unspecified, not intractable, without status migrainosus: Secondary | ICD-10-CM | POA: Diagnosis not present

## 2017-04-12 DIAGNOSIS — M4120 Other idiopathic scoliosis, site unspecified: Secondary | ICD-10-CM

## 2017-04-12 DIAGNOSIS — E785 Hyperlipidemia, unspecified: Secondary | ICD-10-CM

## 2017-04-12 DIAGNOSIS — L97919 Non-pressure chronic ulcer of unspecified part of right lower leg with unspecified severity: Secondary | ICD-10-CM | POA: Diagnosis not present

## 2017-04-12 DIAGNOSIS — E890 Postprocedural hypothyroidism: Secondary | ICD-10-CM

## 2017-04-12 NOTE — Progress Notes (Signed)
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 Back pain   . Endometriosis   . Hypercholesteremia   . Hypothyroidism   . Migraine   . Venous stasis ulcer ([place]) 11/1997   Wound care center, [place]    Past Surgical History:  Procedure Laterality Date  . ABDOMINAL HYSTERECTOMY     1 1/2 ovaries gone, second to endometriosis  . APPENDECTOMY    . CYSTOSCOPY    . HEMORRHOID SURGERY    . HERNIA REPAIR    . ROTATOR CUFF REPAIR    . THYROIDECTOMY      Current Outpatient Medications  Medication Sig Dispense Refill  . aspirin EC 81 MG tablet Take 81 mg by mouth daily.    [name] atenolol (TENORMIN) 50 MG tablet Take 1 tablet (50 mg total) by mouth daily. 90 tablet 3  . benzonatate (TESSALON) 200 MG capsule Take 1 capsule (200 mg total) by mouth 3 (three) times daily as needed for cough. 30 capsule 0  . butalbital-acetaminophen-caffeine (FIORICET, ESGIC) 50-325-40 MG tablet TAKE ONE TABLET BY MOUTH TWICE A DAY AS NEEDED FOR HEADACHE 60 tablet 0  . Cholecalciferol (VITAMIN D-3) 1000 UNITS CAPS Take 1 capsule by mouth daily.    [name] ibuprofen (ADVIL,MOTRIN) 800 MG tablet TAKE 1 TABLET BY MOUTH EVERY 4 TO 6 HOURS AS NEEDED 180 tablet 3  . levothyroxine (SYNTHROID, LEVOTHROID) 100 MCG tablet TAKE 1 TABLET (100 MCG TOTAL) BY MOUTH DAILY BEFORE BREAKFAST. 90 tablet 3  . Multiple Vitamin (MULTIVITAMIN) tablet Take 1 tablet by mouth daily.      [name] omeprazole (PRILOSEC) 20 MG capsule Take 1 capsule (20 mg total) by mouth daily. 90 capsule 3  . simvastatin (ZOCOR) 40 MG tablet Take 1 tablet (40 mg total) by mouth at bedtime. 90 tablet 3  . SUMAtriptan (IMITREX) 20 MG/ACT nasal spray Place 1 spray into the nose as needed.       No current facility-administered medications for this visit.     Allergies  Allergen Reactions  . Codeine     REACTION: NAUSEA AND VOMITING  . Tramadol     Vomiting, LOC     Indication: Patient presents with symptomatic varicose veins of the right lower extremity.  Procedure: Foam sclerotherapy was performed on the right lower extremity. Using ultrasound guidance, 5 mL of foam Sotradecol was used to inject the varicosities of the right lower extremity. Compression wraps were placed. The patient tolerated the procedure well.

## 2017-04-12 NOTE — Progress Notes (Signed)
Hypothyroid. No neck mass, no consistent dysphagia (she has some troubles when she is anxious or worried but otherwise swallows well).  Compliant with med.  No ADE on med.  Energy level is "a little slow" but she is still functional.  Prev TSH 0.32, d/w pt, this is still close to normal and is likely not reasonable to adjust her medication.  She agreed.  Elevated Cholesterol: Using medications without problems: yes Muscle aches: no Diet compliance:yes Exercise: mainly yardwork.    She has back pain at baseline.  The aches are likely not from statin.  D/w pt. L lower back pain, pain can move medially and to R side, across the belt line.  Tylenol helps.  More pain with activity.  Pain with prolonged standing.  Sitting down helps.  Prev CT with multilevel spondylosis and scoliosis.  She can let me know if she wants to go to PT. she wanted to try home yoga/stretching program in the meantime.  She is done this before with some improvement.  Hypertension:    Using medication without problems or lightheadedness: yes Chest pain with exertion:no Edema:no Short of breath:no She has f/u with vein clinic today.   It is reasonable to get f/u done, d/w pt.  She was worried about that and that likely affected her BP.    Headaches. Longstanding h/o migraines.  Rare use of imitrex, not in the last year.  She has used fioricet rarely.  She has occ mild headaches, that seem to be worse with higher stress situations.  Her daughter in [place] has troubles with alcohol and that is stressful for patient.  I gave [facility] info [facility].    Husband designated if patient were incapacitated.    Meds, vitals, and allergies reviewed.   PMH and SH reviewed  ROS: Per HPI unless specifically indicated in ROS section   GEN: nad, alert and oriented HEENT: mucous membranes moist NECK: supple w/o LA CV: rrr. PULM: ctab, no inc wob ABD: soft, +bs EXT: no edema SKIN: no  acute rash

## 2017-04-12 NOTE — Patient Instructions (Addendum)
Please schedule a 30 minute follow up appointment to be done after you see [facility] in the fall.  Take care.  Glad to see you.  I'll await the notes from the vascular clinic.

## 2017-04-13 NOTE — Assessment & Plan Note (Signed)
She does use Fioricet rarely.  She rarely uses Imitrex.  Discussed with patient about medication use.  She has noted stressors that may contribute to her situation.  If she is having more episodes or less response to medication then she will let me know.  Okay for outpatient follow-up.

## 2017-04-13 NOTE — Assessment & Plan Note (Signed)
Continue statin.  Continue work on diet and exercise.  She agrees.  Previous labs discussed with patient.

## 2017-04-13 NOTE — Assessment & Plan Note (Addendum)
Recheck blood pressure was improved.  She has a lot of stressors noted.  I would not change her medication at this point.  She agrees. D/w pt about diet and exercise, etc. >25 minutes spent in face to face time with patient, >50% spent in counselling or coordination of care.

## 2017-04-13 NOTE — Assessment & Plan Note (Signed)
She has known scoliosis and some episodic left lower back pain that can move to the right side of the lower back.  Physical therapy would be reasonable if home exercise and stretching program is not beneficial.  She has no emergent symptoms.  She will update me as needed.

## 2017-04-13 NOTE — Assessment & Plan Note (Signed)
Prev TSH 0.32, d/w pt, this is still close to normal and is likely not reasonable to adjust her medication.  She agreed.  We can recheck yearly.

## 2017-05-03 ENCOUNTER — Ambulatory Visit (INDEPENDENT_AMBULATORY_CARE_PROVIDER_SITE_OTHER): Payer: Medicare Other | Admitting: Vascular Surgery

## 2017-05-03 ENCOUNTER — Encounter (INDEPENDENT_AMBULATORY_CARE_PROVIDER_SITE_OTHER): Payer: Self-pay | Admitting: Vascular Surgery

## 2017-05-03 VITALS — BP 151/74 | HR 65 | Resp 13 | Ht 68.0 in | Wt 191.0 lb

## 2017-05-03 DIAGNOSIS — L97919 Non-pressure chronic ulcer of unspecified part of right lower leg with unspecified severity: Secondary | ICD-10-CM | POA: Diagnosis not present

## 2017-05-03 DIAGNOSIS — I10 Essential (primary) hypertension: Secondary | ICD-10-CM

## 2017-05-03 DIAGNOSIS — I83019 Varicose veins of right lower extremity with ulcer of unspecified site: Secondary | ICD-10-CM

## 2017-05-03 DIAGNOSIS — I83018 Varicose veins of right lower extremity with ulcer other part of lower leg: Secondary | ICD-10-CM | POA: Diagnosis not present

## 2017-05-03 NOTE — Progress Notes (Signed)
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 Back pain   . Endometriosis   . Hypercholesteremia   . Hypothyroidism   . Migraine   . Venous stasis ulcer ([place]) 11/1997   Wound care center, [place]    Past Surgical History:  Procedure Laterality Date  . ABDOMINAL HYSTERECTOMY     1 1/2 ovaries gone, second to endometriosis  . APPENDECTOMY    . CYSTOSCOPY    . HEMORRHOID SURGERY    . HERNIA REPAIR    . ROTATOR CUFF REPAIR    . THYROIDECTOMY      Current Outpatient Medications  Medication Sig Dispense Refill  . aspirin EC 81 MG tablet Take 81 mg by mouth daily.    [name] atenolol (TENORMIN) 50 MG tablet Take 1 tablet (50 mg total) by mouth daily. 90 tablet 3  . benzonatate (TESSALON) 200 MG capsule Take 1 capsule (200 mg total) by mouth 3 (three) times daily as needed for cough. 30 capsule 0  . butalbital-acetaminophen-caffeine (FIORICET, ESGIC) 50-325-40 MG tablet TAKE ONE TABLET BY MOUTH TWICE A DAY AS NEEDED FOR HEADACHE 60 tablet 0  . Cholecalciferol (VITAMIN D-3) 1000 UNITS CAPS Take 1 capsule by mouth daily.    [name] ibuprofen (ADVIL,MOTRIN) 800 MG tablet TAKE 1 TABLET BY MOUTH EVERY 4 TO 6 HOURS AS NEEDED 180 tablet 3  . levothyroxine (SYNTHROID, LEVOTHROID) 100 MCG tablet TAKE 1 TABLET (100 MCG TOTAL) BY MOUTH DAILY BEFORE BREAKFAST. 90 tablet 3  . Multiple Vitamin (MULTIVITAMIN) tablet Take 1 tablet by mouth daily.      [name] omeprazole (PRILOSEC) 20 MG capsule Take 1 capsule (20 mg total) by mouth daily. 90 capsule 3  . simvastatin (ZOCOR) 40 MG tablet Take 1 tablet (40 mg total) by mouth at bedtime. 90 tablet 3  . SUMAtriptan (IMITREX) 20 MG/ACT nasal spray Place 1 spray into the nose as needed.       No current facility-administered medications for this visit.     Allergies  Allergen Reactions  . Codeine     REACTION: NAUSEA AND VOMITING  . Tramadol     Vomiting, LOC     Indication: Patient presents with symptomatic varicose veins of the right lower extremity.  Procedure: Foam sclerotherapy was performed on the right lower extremity. Using ultrasound guidance, 5 mL of foam Sotradecol was used to inject the varicosities of the right lower extremity. Compression wraps were placed. The patient tolerated the procedure well.

## 2017-05-09 DIAGNOSIS — Z1231 Encounter for screening mammogram for malignant neoplasm of breast: Secondary | ICD-10-CM | POA: Diagnosis not present

## 2017-05-09 LAB — HM MAMMOGRAPHY

## 2017-05-16 ENCOUNTER — Encounter: Payer: Self-pay | Admitting: Family Medicine

## 2017-05-16 ENCOUNTER — Ambulatory Visit (INDEPENDENT_AMBULATORY_CARE_PROVIDER_SITE_OTHER): Payer: Medicare Other | Admitting: Family Medicine

## 2017-05-16 DIAGNOSIS — J029 Acute pharyngitis, unspecified: Secondary | ICD-10-CM | POA: Diagnosis not present

## 2017-05-16 MED ORDER — IBUPROFEN 800 MG PO TABS: 800.0000 mg | ORAL_TABLET | Freq: Three times a day (TID) | ORAL | Status: DC | PRN

## 2017-05-16 MED ORDER — FLUTICASONE PROPIONATE 50 MCG/ACT NA SUSP: 2.0000 | Freq: Every day | NASAL | 6 refills | Status: DC

## 2017-05-16 MED ORDER — PREDNISONE 10 MG PO TABS: ORAL_TABLET | ORAL | 0 refills | Status: DC

## 2017-05-16 NOTE — Progress Notes (Signed)
duration of symptoms: a few days.   Rhinorrhea: no Congestion: a little.   ear pain:no sore throat:yes, pain with swallowing.  Can still swallow but with discomfort.   Cough: some cough.   Myalgias: no Hoarse voice.  Has been taking zyrtec at baseline.   No fevers.   No vomiting, no diarrhea.   She is seeing the vascular clinic re: veins in her legs.    Per HPI unless specifically indicated in ROS section   Meds, vitals, and allergies reviewed.   GEN: nad, alert and oriented HEENT: mucous membranes moist, TM w/o erythema, nasal epithelium injected, OP with cobblestoning, sinuses not ttp  NECK: supple w/o LA CV: rrr. PULM: ctab, no inc wob EXT: no edema

## 2017-05-16 NOTE — Patient Instructions (Signed)
Likely allergies causing the throat irritation.  Use flonase in the meantime.  2 sprays per nostril per day.  If not better, then take prednisone with food.  Update [place] as needed.   Take care.  Glad to see you.

## 2017-05-17 NOTE — Assessment & Plan Note (Signed)
Likely environmental allergies causing the throat irritation.  Use flonase in the meantime.  2 sprays per nostril per day.  If not better, then take prednisone with food.  Update [place] as needed.   She agrees.

## 2017-05-23 ENCOUNTER — Ambulatory Visit (INDEPENDENT_AMBULATORY_CARE_PROVIDER_SITE_OTHER): Payer: Medicare Other | Admitting: Vascular Surgery

## 2017-05-23 ENCOUNTER — Encounter (INDEPENDENT_AMBULATORY_CARE_PROVIDER_SITE_OTHER): Payer: Self-pay | Admitting: Vascular Surgery

## 2017-05-23 VITALS — BP 140/72 | HR 66 | Resp 14 | Ht 69.0 in | Wt 192.0 lb

## 2017-05-23 DIAGNOSIS — I83019 Varicose veins of right lower extremity with ulcer of unspecified site: Secondary | ICD-10-CM

## 2017-05-23 DIAGNOSIS — L97919 Non-pressure chronic ulcer of unspecified part of right lower leg with unspecified severity: Secondary | ICD-10-CM | POA: Diagnosis not present

## 2017-05-23 DIAGNOSIS — I83018 Varicose veins of right lower extremity with ulcer other part of lower leg: Secondary | ICD-10-CM

## 2017-05-23 DIAGNOSIS — R1032 Left lower quadrant pain: Secondary | ICD-10-CM | POA: Diagnosis not present

## 2017-05-23 NOTE — Progress Notes (Signed)
Varicose veins of right  lower extremity with inflammation (454.1  I83.10) Current Plans   Indication: Patient presents with symptomatic varicose veins of the right  lower extremity.   Procedure: Sclerotherapy using hypertonic saline mixed with 1% Lidocaine was performed on the right lower extremity. Compression wraps were placed. The patient tolerated the procedure well. 

## 2017-06-16 ENCOUNTER — Encounter: Payer: Self-pay | Admitting: Family Medicine

## 2017-06-16 ENCOUNTER — Other Ambulatory Visit: Payer: Self-pay | Admitting: Family Medicine

## 2017-06-16 NOTE — Telephone Encounter (Signed)
Copied from [place] [id number]. Topic: Quick Communication - Rx Refill/Question >> Jun 16, 2017 10:39 AM Synthia Innocent wrote: Medication: fluocinonide-emollient (LIDEX-E) 0.05 % cream   Has the patient contacted their pharmacy? Yes.  New pharmacy (Agent: If no, request that the patient contact the pharmacy for the refill.) (Agent: If yes, when and what did the pharmacy advise?)  Preferred Pharmacy (with phone number or street name): [facility]   Agent: Please be advised that RX refills may take up to 3 business days. We ask that you follow-up with your pharmacy.

## 2017-06-16 NOTE — Telephone Encounter (Signed)
Refill request for Lidex, medication not on current medication list.  Last OV:04/12/17

## 2017-06-17 MED ORDER — FLUOCINONIDE-E 0.05 % EX CREA: 1.0000 "application " | TOPICAL_CREAM | Freq: Two times a day (BID) | CUTANEOUS | 0 refills | Status: DC | PRN

## 2017-06-17 NOTE — Telephone Encounter (Signed)
Dr Damita Dunnings had just sent mychart message that lidex cream was refilled to [facility] and if pt does not get better needs to come in for appt.

## 2017-06-17 NOTE — Telephone Encounter (Signed)
Sent. Thanks.  I sent mychart message to patient.

## 2017-06-17 NOTE — Telephone Encounter (Signed)
Husband called, checking on med fill, he is requesting a call back from provider,  Please call [phone]

## 2017-06-17 NOTE — Telephone Encounter (Signed)
I spoke with pt and pt sent e-mail to Dr Damita Dunnings on 06/16/17; pt said she is not sure what has caused her back to start itching; pt uses the Lidex cream on mosquito bites as well.Dr Deborra Medina last prescribed 30g on 09/09/14 [facility] [place].

## 2017-07-06 ENCOUNTER — Ambulatory Visit (INDEPENDENT_AMBULATORY_CARE_PROVIDER_SITE_OTHER): Payer: Medicare Other | Admitting: Vascular Surgery

## 2017-07-06 ENCOUNTER — Encounter (INDEPENDENT_AMBULATORY_CARE_PROVIDER_SITE_OTHER): Payer: Medicare Other

## 2017-08-21 ENCOUNTER — Other Ambulatory Visit: Payer: Self-pay | Admitting: Family Medicine

## 2017-08-22 NOTE — Telephone Encounter (Signed)
Please see refill request. Thanks -TLG

## 2017-08-24 NOTE — Telephone Encounter (Signed)
Sent. Thanks.   

## 2017-09-09 ENCOUNTER — Encounter (INDEPENDENT_AMBULATORY_CARE_PROVIDER_SITE_OTHER): Payer: Self-pay | Admitting: Vascular Surgery

## 2017-09-09 ENCOUNTER — Other Ambulatory Visit (INDEPENDENT_AMBULATORY_CARE_PROVIDER_SITE_OTHER): Payer: Self-pay | Admitting: Vascular Surgery

## 2017-09-09 ENCOUNTER — Ambulatory Visit (INDEPENDENT_AMBULATORY_CARE_PROVIDER_SITE_OTHER): Payer: Medicare Other

## 2017-09-09 ENCOUNTER — Ambulatory Visit (INDEPENDENT_AMBULATORY_CARE_PROVIDER_SITE_OTHER): Payer: Medicare Other | Admitting: Vascular Surgery

## 2017-09-09 VITALS — BP 129/59 | HR 67 | Resp 13 | Ht 68.0 in | Wt 193.0 lb

## 2017-09-09 DIAGNOSIS — R600 Localized edema: Secondary | ICD-10-CM | POA: Diagnosis not present

## 2017-09-09 DIAGNOSIS — I10 Essential (primary) hypertension: Secondary | ICD-10-CM

## 2017-09-09 DIAGNOSIS — E785 Hyperlipidemia, unspecified: Secondary | ICD-10-CM | POA: Diagnosis not present

## 2017-09-09 DIAGNOSIS — I872 Venous insufficiency (chronic) (peripheral): Secondary | ICD-10-CM | POA: Diagnosis not present

## 2017-09-09 NOTE — Progress Notes (Signed)
MRN : [id number]  [name] is a 77 y.o. (11-09-1940) female who presents with chief complaint of  Chief Complaint  Patient presents with  . Follow-up    Venous Reflux u/s follow up  .  History of Present Illness: Patient returns today in follow up of venous insufficiency.  We have previously done a laser ablation and sclerotherapy on the right leg for symptomatic venous insufficiency with previous hemorrhage.  That was several months ago.  She has had no further hemorrhage.  Her pain and swelling has been markedly improved.  She has been diligently wearing compression stockings on both legs and this is also improved her pain and swelling on the left leg.  She is studied with venous reflux study today which shows left great saphenous vein reflux with no DVT or superficial thrombophlebitis in the left leg.  Current Outpatient Medications  Medication Sig Dispense Refill  . aspirin EC 81 MG tablet Take 81 mg by mouth daily.    [name] atenolol (TENORMIN) 50 MG tablet Take 1 tablet (50 mg total) by mouth daily. 90 tablet 3  . benzonatate (TESSALON) 200 MG capsule Take 1 capsule (200 mg total) by mouth 3 (three) times daily as needed for cough. 30 capsule 0  . butalbital-acetaminophen-caffeine (FIORICET, ESGIC) 50-325-40 MG tablet TAKE ONE TABLET BY MOUTH TWICE A DAY AS NEEDED FOR HEADACHE 60 tablet 0  . Cholecalciferol (VITAMIN D-3) 1000 UNITS CAPS Take 1 capsule by mouth daily.    . fluocinonide-emollient (LIDEX-E) 0.05 % cream Apply 1 application topically 2 (two) times daily as needed. 30 g 0  . fluticasone (FLONASE) 50 MCG/ACT nasal spray Place 2 sprays into both nostrils daily. 16 g 6  . ibuprofen (ADVIL,MOTRIN) 800 MG tablet Take 1 tablet (800 mg total) by mouth every 8 (eight) hours as needed. With food.    [name] levothyroxine (SYNTHROID, LEVOTHROID) 100 MCG tablet TAKE 1 TABLET (100 MCG TOTAL) BY MOUTH DAILY BEFORE BREAKFAST. 90 tablet 3  . Multiple Vitamin (MULTIVITAMIN) tablet Take 1  tablet by mouth daily.      [name] omeprazole (PRILOSEC) 20 MG capsule TAKE ONE CAPSULE BY MOUTH DAILY 90 capsule 2  . predniSONE (DELTASONE) 10 MG tablet Take 2 a day for 5 days, then 1 a day for 5 days, with food. Don't take with aleve/ibuprofen. 15 tablet 0  . simvastatin (ZOCOR) 40 MG tablet Take 1 tablet (40 mg total) by mouth at bedtime. 90 tablet 3  . SUMAtriptan (IMITREX) 20 MG/ACT nasal spray Place 1 spray into the nose as needed.       No current facility-administered medications for this visit.     Past Medical History:  Diagnosis Date  . Back pain   . Endometriosis   . Hypercholesteremia   . Hypothyroidism   . Migraine   . Venous stasis ulcer ([place]) 11/1997   Wound care center, [place]    Past Surgical History:  Procedure Laterality Date  . ABDOMINAL HYSTERECTOMY     1 1/2 ovaries gone, second to endometriosis  . APPENDECTOMY    . CYSTOSCOPY    . HEMORRHOID SURGERY    . HERNIA REPAIR    . ROTATOR CUFF REPAIR    . THYROIDECTOMY      Social History Social History   Tobacco Use  . Smoking status: Never Smoker  . Smokeless tobacco: Never Used  Substance Use Topics  . Alcohol use: No    Alcohol/week: 0.0 standard drinks  . Drug use:  No     Family History Family History  Problem Relation Age of Onset  . Stroke Mother   . Goiter Mother   . Stomach cancer Sister   . Heart attack Father   . Heart disease Father   . Heart attack Brother   . Kidney disease Brother   . Colon cancer Neg Hx   . Esophageal cancer Neg Hx   . Breast cancer Neg Hx      Allergies  Allergen Reactions  . Codeine     REACTION: NAUSEA AND VOMITING  . Tramadol     Vomiting, LOC    REVIEW OF SYSTEMS(Negative unless checked)  Constitutional: [] Weight loss[] Fever[] Chills Cardiac:[] Chest pain[] Chest pressure[] Palpitations [] Shortness of breath when laying flat [] Shortness of breath at rest [] Shortness of breath with exertion. Vascular: [] Pain in legs with  walking[] Pain in legsat rest[] Pain in legs when laying flat [] Claudication [] Pain in feet when walking [] Pain in feet at rest [] Pain in feet when laying flat [] History of DVT [] Phlebitis [x] Swelling in legs [] Varicose veins [x] Non-healing ulcers Pulmonary: [] Uses home oxygen [] Productive cough[] Hemoptysis [] Wheeze [] COPD [] Asthma Neurologic: [] Dizziness [] Blackouts [] Seizures [] History of stroke [] History of TIA[] Aphasia [] Temporary blindness[] Dysphagia [] Weaknessor numbness in arms [] Weakness or numbnessin legs Musculoskeletal: [] Arthritis [] Joint swelling [] Joint pain [] Low back pain Hematologic:[] Easy bruising[] Easy bleeding [] Hypercoagulable state [] Anemic [] Hepatitis Gastrointestinal:[] Blood in stool[] Vomiting blood[] Gastroesophageal reflux/heartburn[] Abdominal pain Genitourinary: [] Chronic kidney disease [] Difficulturination [] Frequenturination [] Burning with urination[] Hematuria Skin: [] Rashes [x] Ulcers [x] Wounds Psychological: [] History of anxiety[] History of major depression.  Physical Examination  BP (!) 129/59 (BP Location: Right Arm, Patient Position: Sitting)   Pulse 67   Resp 13   Ht 5\' 8"  (1.727 m)   Wt 193 lb (87.5 kg)   BMI 29.35 kg/m  Gen:  WD/WN, NAD.  Appears younger than stated age Head: [place]/AT, No temporalis wasting. Ear/Nose/Throat: Hearing grossly intact, nares w/o erythema or drainage Eyes: Conjunctiva clear. Sclera non-icteric Neck: Supple.  Trachea midline Pulmonary:  Good air movement, no use of accessory muscles.  Cardiac: RRR, no JVD Vascular:  Vessel Right Left  Radial Palpable Palpable                          PT Palpable Palpable  DP Palpable Palpable    Musculoskeletal: M/S 5/5 throughout.  No deformity or atrophy.  Scattered varicosities on the right, more diffuse varicosities on the left.  No significant lower extremity edema. Neurologic:  Sensation grossly intact in extremities.  Symmetrical.  Speech is fluent.  Psychiatric: Judgment intact, Mood & affect appropriate for pt's clinical situation. Dermatologic: No rashes or ulcers noted.  No cellulitis or open wounds.       Labs No results found for this or any previous visit (from the past 2160 hour(s)).  Radiology No results found.  Assessment/Plan HTN (hypertension) blood pressure control important in reducing the progression of atherosclerotic disease. On appropriate oral medications.   HLD (hyperlipidemia) lipid control important in reducing the progression of atherosclerotic disease. Continue statin therapy  Venous (peripheral) insufficiency She is studied with venous reflux study today which shows left great saphenous vein reflux with no DVT or superficial thrombophlebitis in the left leg. She has had a good result on the right leg and conservative therapy has done well and really taking care of her symptoms on the left leg.  As such, there is really no role for intervention at current.  If her symptoms worsen, laser ablation should be considered on the left great saphenous vein.  Otherwise, I will see her  back as needed    Leotis Pain, MD  09/09/2017 2:12 PM    This note was created with Dragon medical transcription system.  Any errors from dictation are purely unintentional

## 2017-09-09 NOTE — Assessment & Plan Note (Signed)
She is studied with venous reflux study today which shows left great saphenous vein reflux with no DVT or superficial thrombophlebitis in the left leg. She has had a good result on the right leg and conservative therapy has done well and really taking care of her symptoms on the left leg.  As such, there is really no role for intervention at current.  If her symptoms worsen, laser ablation should be considered on the left great saphenous vein.  Otherwise, I will see her back as needed

## 2017-09-11 ENCOUNTER — Other Ambulatory Visit: Payer: Self-pay | Admitting: Family Medicine

## 2017-09-13 ENCOUNTER — Other Ambulatory Visit: Payer: Self-pay | Admitting: Family Medicine

## 2017-09-13 NOTE — Telephone Encounter (Signed)
Electronic refill request Last office visit 05/16/17 Last refill 02/06/15 #60

## 2017-09-13 NOTE — Telephone Encounter (Signed)
Has f/u pending. Sent. Thanks.

## 2017-09-13 NOTE — Telephone Encounter (Signed)
Name of Medication: Fioricet 69-325-40 Name of Pharmacy: [facility] or Written Date and Quantity: # 90 on 02/06/15 Last Office Visit and Type: 04/12/17 TOC, 6 mth OV Next Office Visit and Type: 09/30/17 AWV Pt 2 Last Controlled Substance Agreement Date: none Last QUI:VHOY  At 04/12/17 OV noted pt has H/O migraines but rarely uses Fioricet.Please advise.

## 2017-09-14 MED ORDER — BUTALBITAL-APAP-CAFFEINE 50-325-40 MG PO TABS: ORAL_TABLET | ORAL | 0 refills | Status: DC

## 2017-09-14 NOTE — Telephone Encounter (Signed)
Sent. Thanks.   

## 2017-09-25 ENCOUNTER — Other Ambulatory Visit: Payer: Self-pay | Admitting: Family Medicine

## 2017-09-25 DIAGNOSIS — E559 Vitamin D deficiency, unspecified: Secondary | ICD-10-CM

## 2017-09-25 DIAGNOSIS — I10 Essential (primary) hypertension: Secondary | ICD-10-CM

## 2017-09-26 ENCOUNTER — Ambulatory Visit: Payer: Medicare Other

## 2017-09-26 ENCOUNTER — Ambulatory Visit (INDEPENDENT_AMBULATORY_CARE_PROVIDER_SITE_OTHER): Payer: Medicare Other

## 2017-09-26 VITALS — BP 132/80 | HR 62 | Temp 97.7°F | Ht 68.5 in | Wt 193.8 lb

## 2017-09-26 DIAGNOSIS — E559 Vitamin D deficiency, unspecified: Secondary | ICD-10-CM | POA: Diagnosis not present

## 2017-09-26 DIAGNOSIS — Z0000 Encounter for general adult medical examination without abnormal findings: Secondary | ICD-10-CM | POA: Diagnosis not present

## 2017-09-26 DIAGNOSIS — I10 Essential (primary) hypertension: Secondary | ICD-10-CM | POA: Diagnosis not present

## 2017-09-26 LAB — COMPREHENSIVE METABOLIC PANEL
ALBUMIN: 4.1 g/dL (ref 3.5–5.2)
ALK PHOS: 87 U/L (ref 39–117)
ALT: 29 U/L (ref 0–35)
AST: 27 U/L (ref 0–37)
BUN: 14 mg/dL (ref 6–23)
CHLORIDE: 107 meq/L (ref 96–112)
CO2: 28 mEq/L (ref 19–32)
Calcium: 9.3 mg/dL (ref 8.4–10.5)
Creatinine, Ser: 0.75 mg/dL (ref 0.40–1.20)
GFR: 79.65 mL/min (ref >60.00)
Glucose, Bld: 89 mg/dL (ref 70–99)
POTASSIUM: 4.8 meq/L (ref 3.5–5.1)
SODIUM: 141 meq/L (ref 135–145)
TOTAL PROTEIN: 7 g/dL (ref 6.0–8.3)
Total Bilirubin: 0.6 mg/dL (ref 0.2–1.2)

## 2017-09-26 LAB — VITAMIN D 25 HYDROXY (VIT D DEFICIENCY, FRACTURES): VITD: 35.43 ng/mL (ref 30.00–100.00)

## 2017-09-26 LAB — LIPID PANEL
Cholesterol: 167 mg/dL (ref 0–200)
HDL: 49.3 mg/dL (ref >39.00)
LDL CALC: 84 mg/dL (ref 0–99)
NonHDL: 118.11
Total CHOL/HDL Ratio: 3
Triglycerides: 169 mg/dL — ABNORMAL HIGH (ref 0.0–149.0)
VLDL: 33.8 mg/dL (ref 0.0–40.0)

## 2017-09-26 LAB — TSH: TSH: 0.86 u[IU]/mL (ref 0.35–4.50)

## 2017-09-26 MED ORDER — VALACYCLOVIR HCL 1 G PO TABS: 1000.0000 mg | ORAL_TABLET | Freq: Three times a day (TID) | ORAL | 0 refills | Status: DC

## 2017-09-26 NOTE — Progress Notes (Signed)
Subjective:   [name] is a 77 y.o. female who presents for Medicare Annual (Subsequent) preventive examination.  Review of Systems:  N/A Cardiac Risk Factors include: advanced age (>106men, >53 women);dyslipidemia;hypertension     Objective:     Vitals: BP 132/80 (BP Location: Right Arm, Patient Position: Sitting, Cuff Size: Normal)   Pulse 62   Temp 97.7 F (36.5 C) (Oral)   Ht 5' 8.5" (1.74 m) Comment: shoes  Wt 193 lb 12 oz (87.9 kg)   SpO2 97%   BMI 29.03 kg/m   Body mass index is 29.03 kg/m.  Advanced Directives 09/26/2017 09/13/2016 07/29/2015  Does Patient Have a Medical Advance Directive? No No No  Would patient like information on creating a medical advance directive? No - Patient declined - -    Tobacco Social History   Tobacco Use  Smoking Status Never Smoker  Smokeless Tobacco Never Used     Counseling given: No   Clinical Intake:  Pre-visit preparation completed: Yes  Pain : 0-10 Pain Score: 0-No pain     Nutritional Status: BMI 25 -29 Overweight Nutritional Risks: None Diabetes: No  How often do you need to have someone help you when you read instructions, pamphlets, or other written materials from your doctor or pharmacy?: 1 - Never What is the last grade level you completed in school?: 12th grade  Interpreter Needed?: No  Comments: pt lives with spouse Information entered by :: LPinson, LPN  Past Medical History:  Diagnosis Date  . Back pain   . Endometriosis   . Hypercholesteremia   . Hypothyroidism   . Migraine   . Venous stasis ulcer ([place]) 11/1997   Wound care center, [place]   Past Surgical History:  Procedure Laterality Date  . ABDOMINAL HYSTERECTOMY     1 1/2 ovaries gone, second to endometriosis  . APPENDECTOMY    . CYSTOSCOPY    . HEMORRHOID SURGERY    . HERNIA REPAIR    . ROTATOR CUFF REPAIR    . THYROIDECTOMY     Family History  Problem Relation Age of Onset  . Stroke Mother   . Goiter Mother   .  Stomach cancer Sister   . Heart attack Father   . Heart disease Father   . Heart attack Brother   . Kidney disease Brother   . Colon cancer Neg Hx   . Esophageal cancer Neg Hx   . Breast cancer Neg Hx    Social History   Socioeconomic History  . Marital status: Married    Spouse name: Not on file  . Number of children: 2  . Years of education: Not on file  . Highest education level: Not on file  Occupational History  . Occupation: Retired    Fish farm manager: RETIRED  Social Needs  . Financial resource strain: Not on file  . Food insecurity:    Worry: Not on file    Inability: Not on file  . Transportation needs:    Medical: Not on file    Non-medical: Not on file  Tobacco Use  . Smoking status: Never Smoker  . Smokeless tobacco: Never Used  Substance and Sexual Activity  . Alcohol use: No    Alcohol/week: 0.0 standard drinks  . Drug use: No  . Sexual activity: Not on file  Lifestyle  . Physical activity:    Days per week: Not on file    Minutes per session: Not on file  . Stress: Not on file  Relationships  . Social connections:    Talks on phone: Not on file    Gets together: Not on file    Attends religious service: Not on file    Active member of club or organization: Not on file    Attends meetings of clubs or organizations: Not on file    Relationship status: Not on file  Other Topics Concern  . Not on file  Social History Narrative   Lives with husband   Does not have living will.   Would desire CPR but would not want to be on life support for prolonged period of time if futile.    Outpatient Encounter Medications as of 09/26/2017  Medication Sig  . aspirin EC 81 MG tablet Take 81 mg by mouth daily.  [name] atenolol (TENORMIN) 50 MG tablet TAKE ONE TABLET BY MOUTH DAILY  . butalbital-acetaminophen-caffeine (FIORICET, ESGIC) 50-325-40 MG tablet TAKE ONE TABLET BY MOUTH TWICE A DAY AS NEEDED FOR HEADACHE  . Cholecalciferol (VITAMIN D-3) 1000 UNITS CAPS Take 1  capsule by mouth daily.  . fluocinonide-emollient (LIDEX-E) 0.05 % cream Apply 1 application topically 2 (two) times daily as needed.  . fluticasone (FLONASE) 50 MCG/ACT nasal spray Place 2 sprays into both nostrils daily.  [name] ibuprofen (ADVIL,MOTRIN) 800 MG tablet Take 1 tablet (800 mg total) by mouth every 8 (eight) hours as needed. With food.  [name] levothyroxine (SYNTHROID, LEVOTHROID) 100 MCG tablet TAKE ONE TABLET BY MOUTH DAILY  . Multiple Vitamin (MULTIVITAMIN) tablet Take 1 tablet by mouth daily.    [name] omeprazole (PRILOSEC) 20 MG capsule TAKE ONE CAPSULE BY MOUTH DAILY  . simvastatin (ZOCOR) 40 MG tablet Take 1 tablet (40 mg total) by mouth at bedtime.  . SUMAtriptan (IMITREX) 20 MG/ACT nasal spray Place 1 spray into the nose as needed.    . [DISCONTINUED] benzonatate (TESSALON) 200 MG capsule Take 1 capsule (200 mg total) by mouth 3 (three) times daily as needed for cough.  . [DISCONTINUED] levothyroxine (SYNTHROID, LEVOTHROID) 100 MCG tablet TAKE 1 TABLET (100 MCG TOTAL) BY MOUTH DAILY BEFORE BREAKFAST.  . [DISCONTINUED] predniSONE (DELTASONE) 10 MG tablet Take 2 a day for 5 days, then 1 a day for 5 days, with food. Don't take with aleve/ibuprofen.  . valACYclovir (VALTREX) 1000 MG tablet Take 1 tablet (1,000 mg total) by mouth 3 (three) times daily.   No facility-administered encounter medications on file as of 09/26/2017.     Activities of Daily Living In your present state of health, do you have any difficulty performing the following activities: 09/26/2017  Hearing? N  Vision? N  Difficulty concentrating or making decisions? N  Walking or climbing stairs? N  Dressing or bathing? N  Doing errands, shopping? N  Preparing Food and eating ? N  Using the Toilet? N  In the past six months, have you accidently leaked urine? N  Do you have problems with loss of bowel control? N  Managing your Medications? N  Managing your Finances? N  Housekeeping or managing your Housekeeping? N  Some  recent data might be hidden    Patient Care Team: Tonia Ghent, MD as PCP - General (Family Medicine) Odette Fraction as Consulting Physician (Optometry) Ladene Artist, MD as Consulting Physician (Gastroenterology) Danella Sensing, MD as Consulting Physician (Dermatology) Haydee Monica, MD as Consulting Physician (Endocrinology)    Assessment:   This is a routine wellness examination for Wilmary.  Exercise Activities and Dietary recommendations Current Exercise Habits: The patient does  not participate in regular exercise at present, Exercise limited by: None identified  Goals    . Increase water intake     Starting 09/26/2017, I will attempt to drink at [facility] of water with each meal.        Fall Risk Fall Risk  09/26/2017 09/13/2016 09/10/2015 09/09/2014 09/03/2013  Falls in the past year? No No No No Yes  Number falls in past yr: - - - - 1  Injury with Fall? - - - - Yes  Risk for fall due to : - - - - History of fall(s);Other (Comment)  Risk for fall due to: Comment - - - - Had a "spell"   Depression Screen PHQ 2/9 Scores 09/26/2017 09/13/2016 09/10/2015 09/09/2014  PHQ - 2 Score 0 0 0 0  PHQ- 9 Score 0 2 - -     Cognitive Function MMSE - Mini Mental State Exam 09/26/2017 09/13/2016  Orientation to time 5 5  Orientation to Place 5 5  Registration 3 3  Attention/ Calculation 0 0  Recall 3 3  Language- name 2 objects 0 0  Language- repeat 1 1  Language- follow 3 step command 3 3  Language- read & follow direction 0 0  Write a sentence 0 0  Copy design 0 0  Total score 20 20     PLEASE NOTE: A Mini-Cog screen was completed. Maximum score is 20. A value of 0 denotes this part of Folstein MMSE was not completed or the patient failed this part of the Mini-Cog screening.   Mini-Cog Screening Orientation to Time - Max 5 pts Orientation to Place - Max 5 pts Registration - Max 3 pts Recall - Max 3 pts Language Repeat - Max 1 pts Language Follow 3 Step Command - Max 3  pts     Immunization History  Administered Date(s) Administered  . Influenza Split 10/16/2010  . Influenza Whole 10/23/2007, 10/13/2011  . Influenza,inj,Quad PF,6+ Mos 09/21/2012, 10/29/2013, 09/09/2014, 10/10/2015, 11/12/2016  . Pneumococcal Conjugate-13 09/03/2013  . Pneumococcal Polysaccharide-23 08/15/2006  . Td 12/25/2002  . Tdap 01/18/2013  . Zoster 02/02/2012    Screening Tests Health Maintenance  Topic Date Due  . INFLUENZA VACCINE  04/19/2018 (Originally 08/18/2017)  . DEXA SCAN  09/13/2048 (Originally 10/05/2005)  . TETANUS/TDAP  01/19/2023  . PNA vac Low Risk Adult  Completed        Plan:     I have personally reviewed, addressed, and noted the following in the patient's chart:  A. Medical and social history B. Use of alcohol, tobacco or illicit drugs  C. Current medications and supplements D. Functional ability and status E.  Nutritional status F.  Physical activity G. Advance directives H. List of other physicians I.  Hospitalizations, surgeries, and ER visits in previous 12 months J.  [place] to include hearing, vision, cognitive, depression L. Referrals and appointments - none  In addition, I have reviewed and discussed with patient certain preventive protocols, quality metrics, and best practice recommendations. A written personalized care plan for preventive services as well as general preventive health recommendations were provided to patient.  See attached scanned questionnaire for additional information.   Signed,   Lindell Noe, MHA, BS, LPN Health Coach

## 2017-09-26 NOTE — Progress Notes (Signed)
PCP notes:   Health maintenance:  Flu vaccine - addressed  Abnormal screenings:   Hearing - failed  Hearing Screening   125Hz  250Hz  500Hz  1000Hz  2000Hz  3000Hz  4000Hz  6000Hz  8000Hz   Right ear:   40 40 40  0    Left ear:   40 40 40  0     Patient concerns:   Patient reports stinging rash on forehead. PCP notified. PCP assessed patient and initiated treatment for shingles.  Nurse concerns:  None  Next PCP appt:   09/30/2017 @ 1215  I reviewed health advisor's note, was available for consultation on the day of service listed in this note, and agree with documentation and plan. Dermatomal rash on the L V1 distribution, doesn't cross midline, uncomfortable.  No vision changes.  D/w pt about options and VZV path/phys.  Start valtrex.  Tylenol for pain.  She'll call eye clinic.  She had no eye sx at this point.  No vision changes per patient.  No eye pain.  Elsie Stain, MD.

## 2017-09-26 NOTE — Patient Instructions (Addendum)
Per Dr. Damita Dunnings,   Start valtrex, call the eye doctor and tell them you have shingles on your face but no eye symptoms.  See if they want to go ahead and see you.   Ms. Puccinelli , Thank you for taking time to come for your Medicare Wellness Visit. I appreciate your ongoing commitment to your health goals. Please review the following plan we discussed and let me know if I can assist you in the future.   These are the goals we discussed: Goals    . Increase water intake     Starting 09/26/2017, I will attempt to drink at [facility] of water with each meal.        This is a list of the screening recommended for you and due dates:  Health Maintenance  Topic Date Due  . Flu Shot  04/19/2018*  . DEXA scan (bone density measurement)  09/13/2048*  . Tetanus Vaccine  01/19/2023  . Pneumonia vaccines  Completed  *Topic was postponed. The date shown is not the original due date.   Preventive Care for Adults  A healthy lifestyle and preventive care can promote health and wellness. Preventive health guidelines for adults include the following key practices.  . A routine yearly physical is a good way to check with your health care provider about your health and preventive screening. It is a chance to share any concerns and updates on your health and to receive a thorough exam.  . Visit your dentist for a routine exam and preventive care every 6 months. Brush your teeth twice a day and floss once a day. Good oral hygiene prevents tooth decay and gum disease.  . The frequency of eye exams is based on your age, health, family medical history, use  of contact lenses, and other factors. Follow your health care provider's recommendations for frequency of eye exams.  . Eat a healthy diet. Foods like vegetables, fruits, whole grains, low-fat dairy products, and lean protein foods contain the nutrients you need without too many calories. Decrease your intake of foods high in solid fats, added sugars, and salt. Eat  the right amount of calories for you. Get information about a proper diet from your health care provider, if necessary.  . Regular physical exercise is one of the most important things you can do for your health. Most adults should get at least 150 minutes of moderate-intensity exercise (any activity that increases your heart rate and causes you to sweat) each week. In addition, most adults need muscle-strengthening exercises on 2 or more days a week.  Silver Sneakers may be a benefit available to you. To determine eligibility, you may visit the website: www.silversneakers.com or contact program at 512 122 6622 Mon-Fri between 8AM-8PM.   . Maintain a healthy weight. The body mass index (BMI) is a screening tool to identify possible weight problems. It provides an estimate of body fat based on height and weight. Your health care provider can find your BMI and can help you achieve or maintain a healthy weight.   For adults 20 years and older: ? A BMI below 18.5 is considered underweight. ? A BMI of 18.5 to 24.9 is normal. ? A BMI of 25 to 29.9 is considered overweight. ? A BMI of 30 and above is considered obese.   . Maintain normal blood lipids and cholesterol levels by exercising and minimizing your intake of saturated fat. Eat a balanced diet with plenty of fruit and vegetables. Blood tests for lipids and cholesterol should  begin at age 71 and be repeated every 5 years. If your lipid or cholesterol levels are high, you are over 50, or you are at high risk for heart disease, you may need your cholesterol levels checked more frequently. Ongoing high lipid and cholesterol levels should be treated with medicines if diet and exercise are not working.  . If you smoke, find out from your health care provider how to quit. If you do not use tobacco, please do not start.  . If you choose to drink alcohol, please do not consume more than 2 drinks per day. One drink is considered to be 12 ounces (355 mL)  of beer, 5 ounces (148 mL) of wine, or 1.5 ounces (44 mL) of liquor.  . If you are 45-24 years old, ask your health care provider if you should take aspirin to prevent strokes.  . Use sunscreen. Apply sunscreen liberally and repeatedly throughout the day. You should seek shade when your shadow is shorter than you. Protect yourself by wearing long sleeves, pants, a wide-brimmed hat, and sunglasses year round, whenever you are outdoors.  . Once a month, do a whole body skin exam, using a mirror to look at the skin on your back. Tell your health care provider of new moles, moles that have irregular borders, moles that are larger than a pencil eraser, or moles that have changed in shape or color.

## 2017-09-30 ENCOUNTER — Ambulatory Visit (INDEPENDENT_AMBULATORY_CARE_PROVIDER_SITE_OTHER): Payer: Medicare Other | Admitting: Family Medicine

## 2017-09-30 ENCOUNTER — Encounter: Payer: Self-pay | Admitting: Family Medicine

## 2017-09-30 VITALS — BP 132/80 | HR 62 | Temp 97.7°F | Ht 68.5 in | Wt 193.8 lb

## 2017-09-30 DIAGNOSIS — E785 Hyperlipidemia, unspecified: Secondary | ICD-10-CM

## 2017-09-30 DIAGNOSIS — B029 Zoster without complications: Secondary | ICD-10-CM | POA: Diagnosis not present

## 2017-09-30 DIAGNOSIS — G43909 Migraine, unspecified, not intractable, without status migrainosus: Secondary | ICD-10-CM

## 2017-09-30 DIAGNOSIS — G2581 Restless legs syndrome: Secondary | ICD-10-CM

## 2017-09-30 DIAGNOSIS — Z23 Encounter for immunization: Secondary | ICD-10-CM | POA: Diagnosis not present

## 2017-09-30 DIAGNOSIS — I10 Essential (primary) hypertension: Secondary | ICD-10-CM

## 2017-09-30 DIAGNOSIS — E039 Hypothyroidism, unspecified: Secondary | ICD-10-CM | POA: Diagnosis not present

## 2017-09-30 DIAGNOSIS — M545 Low back pain: Secondary | ICD-10-CM

## 2017-09-30 DIAGNOSIS — Z7189 Other specified counseling: Secondary | ICD-10-CM

## 2017-09-30 DIAGNOSIS — Z0000 Encounter for general adult medical examination without abnormal findings: Secondary | ICD-10-CM

## 2017-09-30 MED ORDER — SIMVASTATIN 40 MG PO TABS: 40.0000 mg | ORAL_TABLET | Freq: Every day | ORAL | 3 refills | Status: DC

## 2017-09-30 MED ORDER — ROPINIROLE HCL 0.25 MG PO TABS: 0.2500 mg | ORAL_TABLET | Freq: Every evening | ORAL | 3 refills | Status: DC | PRN

## 2017-09-30 NOTE — Patient Instructions (Signed)
Take requip at night and see how you sleep with that.  Update me as needed.  Take care.  Glad to see you.

## 2017-09-30 NOTE — Progress Notes (Signed)
Shingles.  Facial rash on L forehead, some stinging.  On valtrex.  She talked to the eye clinic in the meantime.  No eye pain.  D/w pt.  Mild HA with taking valtrex, but tolerating med o/w.  She is improved compared to previous.  Hypothyroidism.  Compliant.  No neck mass.   No dysphagia.  Labs d/w pt.    Migraine hx.  On atenolol.  Overall rare episodes.  imitrex helps.  No ADE.   occ use of fioricet w/o ADE.    She has used ibuprofen for back pain w/o ADE.    She was RLS sx at night.  She was asking about requip use.   She had used 0.25mg  requip with relief.    Hypertension:    Using medication without problems or lightheadedness: yes  Chest pain with exertion:no Edema:no Short of breath: only with stairs, at baseline.  She is likely relatively deconditioned.    Elevated Cholesterol: Using medications without problems: yes Muscle aches: no Diet compliance: encouraged, d/w pt.  Exercise: encouraged, d/w pt.   Hearing screening failed.  Declined hearing aids.   Husband designated if patient were incapacitated.   Flu shot today.   zostavax 2014, d/w pt.   tdap 2015 PNA up to date.   Pap not due.   mammo 2019 DXA deferred today given ongoing shingles.  D/w pt.  She agrees.   Colonoscopy 2012  Meds, vitals, and allergies reviewed.   PMH and SH reviewed  ROS: Per HPI unless specifically indicated in ROS section   GEN: nad, alert and oriented HEENT: mucous membranes moist NECK: supple w/o LA CV: rrr. PULM: ctab, no inc wob ABD: soft, +bs EXT: no edema SKIN: Resolving rash on the left side of the forehead.  Does not cross the midline.

## 2017-10-02 DIAGNOSIS — Z7189 Other specified counseling: Secondary | ICD-10-CM | POA: Insufficient documentation

## 2017-10-02 DIAGNOSIS — Z0000 Encounter for general adult medical examination without abnormal findings: Secondary | ICD-10-CM | POA: Insufficient documentation

## 2017-10-02 DIAGNOSIS — B029 Zoster without complications: Secondary | ICD-10-CM | POA: Insufficient documentation

## 2017-10-02 DIAGNOSIS — G2581 Restless legs syndrome: Secondary | ICD-10-CM | POA: Insufficient documentation

## 2017-10-02 DIAGNOSIS — E039 Hypothyroidism, unspecified: Secondary | ICD-10-CM | POA: Insufficient documentation

## 2017-10-02 NOTE — Assessment & Plan Note (Signed)
Hearing screening failed.  Declined hearing aids.   Husband designated if patient were incapacitated.   Flu shot today.   zostavax 2014, d/w pt.   tdap 2015 PNA up to date.   Pap not due.   mammo 2019 DXA deferred today given ongoing shingles.  D/w pt.  She agrees.   Colonoscopy 2012

## 2017-10-02 NOTE — Assessment & Plan Note (Signed)
Labs discussed with patient.  Needs work on diet and exercise.  Continue statin.  She agrees.

## 2017-10-02 NOTE — Assessment & Plan Note (Signed)
No eye pain.  No vision troubles.  Rash appears better.  Pain is limited.  Finish Valtrex.  Routine cautions given.  Follow-up as needed.  She agrees.

## 2017-10-02 NOTE — Assessment & Plan Note (Signed)
Husband designated if patient were incapacitated. 

## 2017-10-02 NOTE — Assessment & Plan Note (Signed)
Rare episodes.  On beta-blocker.  Imitrex helps when she needs it.  She occasionally uses Fioricet otherwise.

## 2017-10-02 NOTE — Assessment & Plan Note (Signed)
Previously has used Requip 0.25 mg at night with relief.  Use that in the meantime as needed.  Update me as needed.  She agrees.  Routine cautions given.

## 2017-10-02 NOTE — Assessment & Plan Note (Signed)
Blood pressure controlled.  No adverse effect.  Labs discussed with patient.  Discussed with patient about diet and exercise.

## 2017-10-02 NOTE — Assessment & Plan Note (Signed)
TSH at goal.  Compliant with medication.  No thyromegaly.  No dysphasia.  Continue as is.  She agrees.

## 2017-10-02 NOTE — Assessment & Plan Note (Signed)
Occasional use of ibuprofen with relief without adverse effect.  Routine cautions given.

## 2017-11-07 DIAGNOSIS — H353132 Nonexudative age-related macular degeneration, bilateral, intermediate dry stage: Secondary | ICD-10-CM | POA: Diagnosis not present

## 2017-11-08 ENCOUNTER — Telehealth (INDEPENDENT_AMBULATORY_CARE_PROVIDER_SITE_OTHER): Payer: Self-pay

## 2017-11-08 NOTE — Telephone Encounter (Signed)
Patient need appointment to be seen per note

## 2017-11-11 ENCOUNTER — Ambulatory Visit (INDEPENDENT_AMBULATORY_CARE_PROVIDER_SITE_OTHER): Payer: Medicare Other | Admitting: Nurse Practitioner

## 2017-11-23 ENCOUNTER — Encounter (INDEPENDENT_AMBULATORY_CARE_PROVIDER_SITE_OTHER): Payer: Medicare Other

## 2017-11-23 ENCOUNTER — Ambulatory Visit (INDEPENDENT_AMBULATORY_CARE_PROVIDER_SITE_OTHER): Payer: Medicare Other | Admitting: Vascular Surgery

## 2017-11-23 ENCOUNTER — Encounter

## 2017-12-22 ENCOUNTER — Encounter: Payer: Self-pay | Admitting: Family Medicine

## 2017-12-22 ENCOUNTER — Ambulatory Visit (INDEPENDENT_AMBULATORY_CARE_PROVIDER_SITE_OTHER): Payer: Medicare Other | Admitting: Family Medicine

## 2017-12-22 VITALS — BP 132/68 | HR 66 | Temp 97.6°F | Ht 68.5 in | Wt 194.8 lb

## 2017-12-22 DIAGNOSIS — J069 Acute upper respiratory infection, unspecified: Secondary | ICD-10-CM | POA: Diagnosis not present

## 2017-12-22 MED ORDER — BENZONATATE 200 MG PO CAPS: 200.0000 mg | ORAL_CAPSULE | Freq: Three times a day (TID) | ORAL | 1 refills | Status: DC | PRN

## 2017-12-22 NOTE — Assessment & Plan Note (Signed)
Viral -day 3 and reassuring exam  No signs of bacterial infection at this time Handout on viral illness given Disc symptomatic care - see instructions on AVS Px tessalon for prn use Update if not starting to improve in a week or if worsening    Meds ordered this encounter  Medications  . benzonatate (TESSALON) 200 MG capsule    Sig: Take 1 capsule (200 mg total) by mouth 3 (three) times daily as needed for cough. Do not bite pill    Dispense:  30 capsule    Refill:  1

## 2017-12-22 NOTE — Patient Instructions (Addendum)
Drink lots of fluids Rest when you can   I sent tessalon pearles to your pharmacy for cough  mucinex DM is ok over the counter as well  Nasal saline spray can help congestion   Warm or cool compress on face may help sinus pressure and pain and headache   Tylenol for pain or fever   Update if not starting to improve in a week or if worsening  (please let us know )   This is a viral illness (head and chest cold) that does not require antibiotics but if symptoms change we need to know

## 2017-12-22 NOTE — Progress Notes (Signed)
Subjective:    Patient ID: [name], female    DOB: Jun 03, 1940, 77 y.o.   MRN: [id number]  HPI 77 yo pt of Dr Damita Dunnings here for cough and ST /congestion   3 days ago started with a cough  Productive of green sputum No wheezing or sob   Then raw feeling throat  She usually gets a z pak --"only thing that works)   No fever  No body aches   (a little cold at night)   Ears - are ok  Some runny nose - d/c is clear  Nagging headache (h/o migraines) More in her face now   Otc: took mucinex (? DM) -not working well   Husband and grandchild were sick first   Patient Active Problem List   Diagnosis Date Noted  . URI with cough and congestion 12/22/2017  . Health care maintenance 10/02/2017  . Advance care planning 10/02/2017  . Shingles 10/02/2017  . Hypothyroidism 10/02/2017  . Restless leg syndrome 10/02/2017  . Insomnia 03/13/2017  . Axillary pain 03/13/2017  . 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 Skin ulcer of right foot, limited to breakdown of skin ([place]) 10/26/2016  . Well woman exam 09/15/2016  . GERD (gastroesophageal reflux disease) 09/15/2016  . Vitamin D deficiency 09/10/2015  . Bleeding from varicose vein 08/06/2015  . Spondylosis 09/17/2014  . 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 Low back pain 09/09/2014  . HLD (hyperlipidemia) 04/16/2013  . Pharyngitis 05/08/2012  . HTN (hypertension) 05/08/2012  . Post menopausal syndrome 01/27/2012  . Migraine 09/11/2007  . 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 Venous (peripheral) insufficiency 09/08/2007   Past Medical History:  Diagnosis Date  . Back pain   . Endometriosis   . HLD (hyperlipidemia)   . Hypercholesteremia   . Hypothyroidism   . Migraine   . Venous stasis ulcer ([place]) 11/1997   Wound care center, [place]   Past Surgical History:  Procedure Laterality Date  . ABDOMINAL HYSTERECTOMY     1 1/2 ovaries gone, second to endometriosis  . APPENDECTOMY    . CYSTOSCOPY    .  HEMORRHOID SURGERY    . HERNIA REPAIR    . ROTATOR CUFF REPAIR    . THYROIDECTOMY     Social History   Tobacco Use  . Smoking status: Never Smoker  . Smokeless tobacco: Never Used  Substance Use Topics  . Alcohol use: No    Alcohol/week: 0.0 standard drinks  . Drug use: No   Family History  Problem Relation Age of Onset  . Stroke Mother   . Goiter Mother   . Stomach cancer Sister   . Heart attack Father   . Heart disease Father   . Heart attack Brother   . Kidney disease Brother   . Colon cancer Neg Hx   . Esophageal cancer Neg Hx   . Breast cancer Neg Hx    Allergies  Allergen Reactions  . Codeine     REACTION: NAUSEA AND VOMITING  . Tramadol     Vomiting, LOC   Current Outpatient Medications on File Prior to Visit  Medication Sig Dispense Refill  . aspirin EC 81 MG tablet Take 81 mg by mouth daily.    [name] atenolol (TENORMIN) 50 MG tablet TAKE ONE TABLET BY MOUTH DAILY 90 tablet 2  . butalbital-acetaminophen-caffeine (FIORICET, ESGIC) 50-325-40 MG tablet TAKE ONE TABLET BY MOUTH TWICE A DAY AS NEEDED FOR HEADACHE 60 tablet 0  . Cholecalciferol (VITAMIN D-3)  1000 UNITS CAPS Take 1 capsule by mouth daily.    . fluocinonide-emollient (LIDEX-E) 0.05 % cream Apply 1 application topically 2 (two) times daily as needed. 30 g 0  . fluticasone (FLONASE) 50 MCG/ACT nasal spray Place 2 sprays into both nostrils daily. 16 g 6  . ibuprofen (ADVIL,MOTRIN) 800 MG tablet Take 1 tablet (800 mg total) by mouth every 8 (eight) hours as needed. With food.    [name] levothyroxine (SYNTHROID, LEVOTHROID) 100 MCG tablet TAKE ONE TABLET BY MOUTH DAILY 90 tablet 2  . Multiple Vitamin (MULTIVITAMIN) tablet Take 1 tablet by mouth daily.      [name] omeprazole (PRILOSEC) 20 MG capsule TAKE ONE CAPSULE BY MOUTH DAILY 90 capsule 2  . rOPINIRole (REQUIP) 0.25 MG tablet Take 1 tablet (0.25 mg total) by mouth at bedtime as needed. 90 tablet 3  . simvastatin (ZOCOR) 40 MG tablet Take 1 tablet (40 mg total) by  mouth at bedtime. 90 tablet 3  . SUMAtriptan (IMITREX) 20 MG/ACT nasal spray Place 1 spray into the nose as needed.      . valACYclovir (VALTREX) 1000 MG tablet Take 1 tablet (1,000 mg total) by mouth 3 (three) times daily. 21 tablet 0   No current facility-administered medications on file prior to visit.     Review of Systems  Constitutional: Positive for appetite change and fatigue. Negative for fever.  HENT: Positive for congestion, postnasal drip, rhinorrhea, sinus pressure, sneezing and sore throat. Negative for ear discharge, ear pain and voice change.   Eyes: Negative for pain and discharge.  Respiratory: Positive for cough. Negative for shortness of breath, wheezing and stridor.   Cardiovascular: Negative for chest pain.  Gastrointestinal: Negative for diarrhea, nausea and vomiting.  Genitourinary: Negative for frequency, hematuria and urgency.  Musculoskeletal: Negative for arthralgias and myalgias.  Skin: Negative for rash.  Neurological: Positive for headaches. Negative for dizziness, weakness and light-headedness.  Psychiatric/Behavioral: Negative for confusion and dysphoric mood.       Objective:   Physical Exam  Constitutional: She appears well-developed and well-nourished. No distress.  obese and well appearing   HENT:  Head: Normocephalic and atraumatic.  Right Ear: Tympanic membrane and external ear normal.  Left Ear: Tympanic membrane and external ear normal.  Mouth/Throat: Oropharynx is clear and moist. No oropharyngeal exudate or posterior oropharyngeal erythema.  Nares are injected and congested  No sinus tenderness Clear rhinorrhea and post nasal drip  No throat erythema /swelling or exudate   Eyes: Pupils are equal, round, and reactive to light. Conjunctivae and EOM are normal. Right eye exhibits no discharge. Left eye exhibits no discharge.  Neck: Normal range of motion. Neck supple.  Cardiovascular: Normal rate and normal heart sounds.  Pulmonary/Chest:  Effort normal and breath sounds normal. No stridor. No respiratory distress. She has no wheezes. She has no rales. She exhibits no tenderness.  Good air exch  No rales or rhonchi No wheeze even on forced exp    Lymphadenopathy:    She has no cervical adenopathy.  Neurological: She is alert.  Skin: Skin is warm and dry. No rash noted.  Psychiatric: She has a normal mood and affect.  Pleasant           Assessment & Plan:   Problem List Items Addressed This Visit      Respiratory   URI with cough and congestion - Primary    Viral -day 3 and reassuring exam  No signs of bacterial infection at this time Handout on  viral illness given Disc symptomatic care - see instructions on AVS Px tessalon for prn use Update if not starting to improve in a week or if worsening    Meds ordered this encounter  Medications  . benzonatate (TESSALON) 200 MG capsule    Sig: Take 1 capsule (200 mg total) by mouth 3 (three) times daily as needed for cough. Do not bite pill    Dispense:  30 capsule    Refill:  1

## 2018-02-07 DIAGNOSIS — H524 Presbyopia: Secondary | ICD-10-CM | POA: Diagnosis not present

## 2018-02-07 DIAGNOSIS — H2513 Age-related nuclear cataract, bilateral: Secondary | ICD-10-CM | POA: Diagnosis not present

## 2018-02-07 DIAGNOSIS — H353132 Nonexudative age-related macular degeneration, bilateral, intermediate dry stage: Secondary | ICD-10-CM | POA: Diagnosis not present

## 2018-02-07 DIAGNOSIS — H18453 Nodular corneal degeneration, bilateral: Secondary | ICD-10-CM | POA: Diagnosis not present

## 2018-02-17 ENCOUNTER — Telehealth (INDEPENDENT_AMBULATORY_CARE_PROVIDER_SITE_OTHER): Payer: Self-pay | Admitting: Vascular Surgery

## 2018-02-17 NOTE — Telephone Encounter (Signed)
Please advise on below  

## 2018-02-17 NOTE — Telephone Encounter (Signed)
This is not necessarily normal, but it may not be caused by her vascular system.  Having issues with venous insufficiency would not cause these symptoms.  Her previous studies in 2018 suggested that she had normal flow in her arteries ( if it was not normal this could cause numbness).  We can bring her in for ABIs to take another look at her arterial system.  If these studies are normal, she should consult her PCP as it could be an issue involving her back.

## 2018-02-17 NOTE — Telephone Encounter (Signed)
Spoke with pt about Fallon's message. Per pt she has been having increased back pain and would like to consult first with her pcp and then follow back up with [place] if needed. She had no additional questions at this time. Nothing further is needed

## 2018-03-13 ENCOUNTER — Telehealth: Payer: Self-pay

## 2018-03-13 NOTE — Telephone Encounter (Signed)
[place] Night - Client Nonclinical Telephone Record [facility] Night - Client Client Site [facility] [place] Physician Renford Dills - MD Contact Type Call Who Is Calling Patient / Member / Family / Caregiver Caller Name [name] Caller Phone Number [phone] Patient Name [name] Patient DOB March 24, 1940 Call Type Message Only Information Provided Reason for Call Request to Schedule Office Appointment Initial Comment Caller states that she has a knot on the back of her right leg. She wants to be seen today. Declined triage. Additional Comment Please call her asap. Call Closed By: Windy Canny Transaction Date/Time: 03/13/2018 8:02:59 AM (ET)

## 2018-03-13 NOTE — Telephone Encounter (Addendum)
Noted. Thanks. Will see at OV.  

## 2018-03-13 NOTE — Telephone Encounter (Signed)
Pt has already scheduled an appt with Dr Damita Dunnings on 03/16/18 at 8:30. I spoke with pt and offered sooner appt with different provider but pt only wants to see Dr Damita Dunnings. No CP or SOB. There is some swelling in rt knee but pt said she has that and has gotten a knee brace to wear which helps pain. Pt said thinks has knot in rt lower leg on the side; no noted redness, warmth or new swelling; pt wears support hose for swelling in legs; pt sees varicose vein dr also. Pt advised if develops a lot of pain in lower rt leg,swelling,redness, CP or SOB to go to ED; pt voiced understanding. FYI to Dr Damita Dunnings.

## 2018-03-14 ENCOUNTER — Ambulatory Visit: Payer: Medicare Other | Admitting: Family Medicine

## 2018-03-14 DIAGNOSIS — L820 Inflamed seborrheic keratosis: Secondary | ICD-10-CM | POA: Diagnosis not present

## 2018-03-14 DIAGNOSIS — Z85828 Personal history of other malignant neoplasm of skin: Secondary | ICD-10-CM | POA: Diagnosis not present

## 2018-03-14 DIAGNOSIS — D1801 Hemangioma of skin and subcutaneous tissue: Secondary | ICD-10-CM | POA: Diagnosis not present

## 2018-03-14 DIAGNOSIS — L570 Actinic keratosis: Secondary | ICD-10-CM | POA: Diagnosis not present

## 2018-03-15 ENCOUNTER — Other Ambulatory Visit (INDEPENDENT_AMBULATORY_CARE_PROVIDER_SITE_OTHER): Payer: Self-pay | Admitting: Nurse Practitioner

## 2018-03-15 ENCOUNTER — Telehealth (INDEPENDENT_AMBULATORY_CARE_PROVIDER_SITE_OTHER): Payer: Self-pay | Admitting: Vascular Surgery

## 2018-03-15 NOTE — Telephone Encounter (Signed)
I called and left a message on patient voicemail to return a call back to the office pertaining to phone message

## 2018-03-16 ENCOUNTER — Ambulatory Visit
Admission: RE | Admit: 2018-03-16 | Discharge: 2018-03-16 | Disposition: A | Discharge status: Home / Self Care | Payer: Medicare Other | Source: Ambulatory Visit | Attending: Family Medicine | Admitting: Family Medicine

## 2018-03-16 ENCOUNTER — Encounter: Payer: Self-pay | Admitting: Family Medicine

## 2018-03-16 ENCOUNTER — Telehealth: Payer: Self-pay

## 2018-03-16 ENCOUNTER — Ambulatory Visit (INDEPENDENT_AMBULATORY_CARE_PROVIDER_SITE_OTHER): Payer: Medicare Other | Admitting: Family Medicine

## 2018-03-16 VITALS — BP 122/64 | HR 69 | Temp 97.8°F | Wt 196.6 lb

## 2018-03-16 DIAGNOSIS — M7989 Other specified soft tissue disorders: Secondary | ICD-10-CM

## 2018-03-16 NOTE — Telephone Encounter (Signed)
I didn't get a call report about this.  Please notify pt.   Neg for DVT.   Would try compression stockings and elevation in the meantime and update me as needed.  Thanks.

## 2018-03-16 NOTE — Telephone Encounter (Signed)
Left message for patient to call back. CRM created 

## 2018-03-16 NOTE — Telephone Encounter (Addendum)
Ms. Labarre had a DTV done this morning. Results are negative.

## 2018-03-16 NOTE — Progress Notes (Signed)
Knot on R leg, for about 3 days.  Started behind the R knee, pain on ROM, then with a puffy area on the medial proximal lower leg.  Some R leg swelling.  No FCNAVD.  No CP, not SOB.  No L sided sx.  No h/o DVT.  No injury, no clear trigger.  Popliteal area feels better compared to prev.    Meds, vitals, and allergies reviewed.   ROS: Per HPI unless specifically indicated in ROS section   nad ncat rrr ctab abd normal BS R>L trace BLE with a puffy area on the medial proximal R lower leg- locally ttp w/o erythema or bruise Knee w/o swelling or redness.

## 2018-03-16 NOTE — Telephone Encounter (Signed)
Patient returned call.  Please call patient back at [phone].

## 2018-03-16 NOTE — Telephone Encounter (Signed)
Spoke with patient. She stated she is in a lot of pain and would like to know if Dr. Damita Dunnings has any recommendations.  Please advise.

## 2018-03-16 NOTE — Assessment & Plan Note (Signed)
No CP, no h/o DVT.  Would check u/s to exclude DVT.  D/w pt about rationale for eval.  She agrees.  Awaiting call report.

## 2018-03-16 NOTE — Telephone Encounter (Signed)
I would try ibuprofen since she can't use use tramadol.  Would try compression stockings and elevation in the meantime.  She can try icing the area also.  Please update me as needed.

## 2018-03-16 NOTE — Patient Instructions (Signed)
Go see Rosaria Ferries on the way out.  Take care.  Glad to see you.

## 2018-03-17 NOTE — Telephone Encounter (Signed)
Spoke to pt

## 2018-03-17 NOTE — Telephone Encounter (Signed)
Pt returning call to nurse. Please call pt °

## 2018-05-07 ENCOUNTER — Other Ambulatory Visit: Payer: Self-pay | Admitting: Family Medicine

## 2018-05-21 ENCOUNTER — Other Ambulatory Visit: Payer: Self-pay | Admitting: Family Medicine

## 2018-06-19 ENCOUNTER — Other Ambulatory Visit: Payer: Self-pay | Admitting: *Deleted

## 2018-06-19 ENCOUNTER — Other Ambulatory Visit: Payer: Self-pay | Admitting: Family Medicine

## 2018-06-19 MED ORDER — LEVOTHYROXINE SODIUM 100 MCG PO TABS: 100.0000 ug | ORAL_TABLET | Freq: Every day | ORAL | 0 refills | Status: DC

## 2018-06-29 ENCOUNTER — Telehealth: Payer: Self-pay | Admitting: Family Medicine

## 2018-06-29 NOTE — Telephone Encounter (Signed)
I would monitor the site and update [place] if she has sx otherwise.  If she only has local irritation, then she doesn't need other treatment.  Thanks.

## 2018-06-29 NOTE — Telephone Encounter (Signed)
Patient advised.

## 2018-06-29 NOTE — Telephone Encounter (Signed)
Patient states her beautician found a tick in her hairline on her neck.  The tick was removed and saved and it clearly was removed intact.  Patient just wants to know if anything more needs to be done.  There is only just a tiny red spot at the area.

## 2018-06-29 NOTE — Telephone Encounter (Signed)
Patient is requesting a call back. Stated it was some what urgent. She did not give details as to what she was needing just that she needed to speak with Dr Josefine Class Nurse when she returns from lunch  Patient's Phone- [phone]

## 2018-07-09 ENCOUNTER — Other Ambulatory Visit: Payer: Self-pay | Admitting: Family Medicine

## 2018-07-11 NOTE — Telephone Encounter (Signed)
Sent. Thanks.   

## 2018-07-18 DIAGNOSIS — Z1231 Encounter for screening mammogram for malignant neoplasm of breast: Secondary | ICD-10-CM | POA: Diagnosis not present

## 2018-07-18 LAB — HM MAMMOGRAPHY

## 2018-08-01 ENCOUNTER — Encounter: Payer: Self-pay | Admitting: Family Medicine

## 2018-08-30 DIAGNOSIS — H353132 Nonexudative age-related macular degeneration, bilateral, intermediate dry stage: Secondary | ICD-10-CM | POA: Diagnosis not present

## 2018-10-09 ENCOUNTER — Other Ambulatory Visit: Payer: Self-pay

## 2018-10-09 ENCOUNTER — Ambulatory Visit: Payer: Medicare Other

## 2018-10-09 ENCOUNTER — Ambulatory Visit (INDEPENDENT_AMBULATORY_CARE_PROVIDER_SITE_OTHER): Payer: Medicare Other

## 2018-10-09 DIAGNOSIS — Z0000 Encounter for general adult medical examination without abnormal findings: Secondary | ICD-10-CM

## 2018-10-09 NOTE — Patient Instructions (Signed)
[name] , Thank you for taking time to come for your Medicare Wellness Visit. I appreciate your ongoing commitment to your health goals. Please review the following plan we discussed and let me know if I can assist you in the future.   Screening recommendations/referrals: Colonoscopy: no longer required, completed 10/20/2010 Mammogram: up to date, completed 07/18/2018 Bone Density: Will talk with provider about having this completed Recommended yearly ophthalmology/optometry visit for glaucoma screening and checkup Recommended yearly dental visit for hygiene and checkup  Vaccinations: Influenza vaccine: up to date, completed 10/06/2018 Pneumococcal vaccine: series completed Tdap vaccine: up to date, completed 01/18/2013 Shingles vaccine: series completed, completed Shingrix #2 10/06/2018    Advanced directives: Please bring a copy of your POA (Power of [place]) and/or Living Will to your next appointment.   Conditions/risks identified: hypertension, hyperlipidemia  Next appointment: 10/13/2018 @ 8:45 am    Preventive Care 65 Years and Older, Female Preventive care refers to lifestyle choices and visits with your health care provider that can promote health and wellness. What does preventive care include?  A yearly physical exam. This is also called an annual well check.  Dental exams once or twice a year.  Routine eye exams. Ask your health care provider how often you should have your eyes checked.  Personal lifestyle choices, including:  Daily care of your teeth and gums.  Regular physical activity.  Eating a healthy diet.  Avoiding tobacco and drug use.  Limiting alcohol use.  Practicing safe sex.  Taking low-dose aspirin every day.  Taking vitamin and mineral supplements as recommended by your health care provider. What happens during an annual well check? The services and screenings done by your health care provider during your annual well check will depend on  your age, overall health, lifestyle risk factors, and family history of disease. Counseling  Your health care provider may ask you questions about your:  Alcohol use.  Tobacco use.  Drug use.  Emotional well-being.  Home and relationship well-being.  Sexual activity.  Eating habits.  History of falls.  Memory and ability to understand (cognition).  Work and work Statistician.  Reproductive health. Screening  You may have the following tests or measurements:  Height, weight, and BMI.  Blood pressure.  Lipid and cholesterol levels. These may be checked every 5 years, or more frequently if you are over 74 years old.  Skin check.  Lung cancer screening. You may have this screening every year starting at age 83 if you have a 30-pack-year history of smoking and currently smoke or have quit within the past 15 years.  Fecal occult blood test (FOBT) of the stool. You may have this test every year starting at age 12.  Flexible sigmoidoscopy or colonoscopy. You may have a sigmoidoscopy every 5 years or a colonoscopy every 10 years starting at age 11.  Hepatitis C blood test.  Hepatitis B blood test.  Sexually transmitted disease (STD) testing.  Diabetes screening. This is done by checking your blood sugar (glucose) after you have not eaten for a while (fasting). You may have this done every 1-3 years.  Bone density scan. This is done to screen for osteoporosis. You may have this done starting at age 60.  Mammogram. This may be done every 1-2 years. Talk to your health care provider about how often you should have regular mammograms. Talk with your health care provider about your test results, treatment options, and if necessary, the need for more tests. Vaccines  Your health  care provider may recommend certain vaccines, such as:  Influenza vaccine. This is recommended every year.  Tetanus, diphtheria, and acellular pertussis (Tdap, Td) vaccine. You may need a Td booster  every 10 years.  Zoster vaccine. You may need this after age 73.  Pneumococcal 13-valent conjugate (PCV13) vaccine. One dose is recommended after age 75.  Pneumococcal polysaccharide (PPSV23) vaccine. One dose is recommended after age 82. Talk to your health care provider about which screenings and vaccines you need and how often you need them. This information is not intended to replace advice given to you by your health care provider. Make sure you discuss any questions you have with your health care provider. Document Released: 01/31/2015 Document Revised: 09/24/2015 Document Reviewed: 11/05/2014 Elsevier Interactive Patient Education  2017 [place] Prevention in the Home Falls can cause injuries. They can happen to people of all ages. There are many things you can do to make your home safe and to help prevent falls. What can I do on the outside of my home?  Regularly fix the edges of walkways and driveways and fix any cracks.  Remove anything that might make you trip as you walk through a door, such as a raised step or threshold.  Trim any bushes or trees on the path to your home.  Use bright outdoor lighting.  Clear any walking paths of anything that might make someone trip, such as rocks or tools.  Regularly check to see if handrails are loose or broken. Make sure that both sides of any steps have handrails.  Any raised decks and porches should have guardrails on the edges.  Have any leaves, snow, or ice cleared regularly.  Use sand or salt on walking paths during winter.  Clean up any spills in your garage right away. This includes oil or grease spills. What can I do in the bathroom?  Use night lights.  Install grab bars by the toilet and in the tub and shower. Do not use towel bars as grab bars.  Use non-skid mats or decals in the tub or shower.  If you need to sit down in the shower, use a plastic, non-slip stool.  Keep the floor dry. Clean up any  water that spills on the floor as soon as it happens.  Remove soap buildup in the tub or shower regularly.  Attach bath mats securely with double-sided non-slip rug tape.  Do not have throw rugs and other things on the floor that can make you trip. What can I do in the bedroom?  Use night lights.  Make sure that you have a light by your bed that is easy to reach.  Do not use any sheets or blankets that are too big for your bed. They should not hang down onto the floor.  Have a firm chair that has side arms. You can use this for support while you get dressed.  Do not have throw rugs and other things on the floor that can make you trip. What can I do in the kitchen?  Clean up any spills right away.  Avoid walking on wet floors.  Keep items that you use a lot in easy-to-reach places.  If you need to reach something above you, use a strong step stool that has a grab bar.  Keep electrical cords out of the way.  Do not use floor polish or wax that makes floors slippery. If you must use wax, use non-skid floor wax.  Do not have  throw rugs and other things on the floor that can make you trip. What can I do with my stairs?  Do not leave any items on the stairs.  Make sure that there are handrails on both sides of the stairs and use them. Fix handrails that are broken or loose. Make sure that handrails are as long as the stairways.  Check any carpeting to make sure that it is firmly attached to the stairs. Fix any carpet that is loose or worn.  Avoid having throw rugs at the top or bottom of the stairs. If you do have throw rugs, attach them to the floor with carpet tape.  Make sure that you have a light switch at the top of the stairs and the bottom of the stairs. If you do not have them, ask someone to add them for you. What else can I do to help prevent falls?  Wear shoes that:  Do not have high heels.  Have rubber bottoms.  Are comfortable and fit you well.  Are closed  at the toe. Do not wear sandals.  If you use a stepladder:  Make sure that it is fully opened. Do not climb a closed stepladder.  Make sure that both sides of the stepladder are locked into place.  Ask someone to hold it for you, if possible.  Clearly mark and make sure that you can see:  Any grab bars or handrails.  First and last steps.  Where the edge of each step is.  Use tools that help you move around (mobility aids) if they are needed. These include:  Canes.  Walkers.  Scooters.  Crutches.  Turn on the lights when you go into a dark area. Replace any light bulbs as soon as they burn out.  Set up your furniture so you have a clear path. Avoid moving your furniture around.  If any of your floors are uneven, fix them.  If there are any pets around you, be aware of where they are.  Review your medicines with your doctor. Some medicines can make you feel dizzy. This can increase your chance of falling. Ask your doctor what other things that you can do to help prevent falls. This information is not intended to replace advice given to you by your health care provider. Make sure you discuss any questions you have with your health care provider. Document Released: 10/31/2008 Document Revised: 06/12/2015 Document Reviewed: 02/08/2014 Elsevier Interactive Patient Education  2017 [employer].

## 2018-10-09 NOTE — Progress Notes (Signed)
PCP notes: none  Health Maintenance: Needs Dexa scan completed non    Abnormal Screenings: none    Patient concerns: none    Nurse concerns: none    Next PCP appt.: 10/13/2018 @ 8:45 am

## 2018-10-09 NOTE — Progress Notes (Signed)
Subjective:   [name] is a 78 y.o. female who presents for Medicare Annual (Subsequent) preventive examination.  Review of Systems:    This visit is being conducted through telemedicine due to the COVID-19 pandemic. This patient has given me verbal consent via doximity to conduct this visit, patient states they are participating from their home address. Some vital signs may be absent or patient reported.    Patient identification: identified by name, DOB, and current address  Cardiac Risk Factors include: advanced age (>44men, >58 women);hypertension;dyslipidemia     Objective:     Vitals: There were no vitals taken for this visit.  There is no height or weight on file to calculate BMI.  Advanced Directives 10/09/2018 09/26/2017 09/13/2016 07/29/2015  Does Patient Have a Medical Advance Directive? Yes No No No  Type of Paramedic of [place];Living will - - -  Copy of [place] in Chart? No - copy requested - - -  Would patient like information on creating a medical advance directive? - No - Patient declined - -    Tobacco Social History   Tobacco Use  Smoking Status Never Smoker  Smokeless Tobacco Never Used     Counseling given: Not Answered   Clinical Intake:  Pre-visit preparation completed: Yes  Pain : No/denies pain     Nutritional Risks: None Diabetes: No  How often do you need to have someone help you when you read instructions, pamphlets, or other written materials from your doctor or pharmacy?: 1 - Never What is the last grade level you completed in school?: 12th grade  Interpreter Needed?: No  Information entered by :: CJohnson, LPN  Past Medical History:  Diagnosis Date  . Back pain   . Endometriosis   . HLD (hyperlipidemia)   . Hypercholesteremia   . Hypothyroidism   . Migraine   . Venous stasis ulcer ([place]) 11/1997   Wound care center, [place]   Past Surgical History:  Procedure  Laterality Date  . ABDOMINAL HYSTERECTOMY     1 1/2 ovaries gone, second to endometriosis  . APPENDECTOMY    . CYSTOSCOPY    . HEMORRHOID SURGERY    . HERNIA REPAIR    . ROTATOR CUFF REPAIR    . THYROIDECTOMY     Family History  Problem Relation Age of Onset  . Stroke Mother   . Goiter Mother   . Stomach cancer Sister   . Heart attack Father   . Heart disease Father   . Heart attack Brother   . Kidney disease Brother   . Colon cancer Neg Hx   . Esophageal cancer Neg Hx   . Breast cancer Neg Hx    Social History   Socioeconomic History  . Marital status: Married    Spouse name: Not on file  . Number of children: 2  . Years of education: Not on file  . Highest education level: Not on file  Occupational History  . Occupation: Retired    Fish farm manager: RETIRED  Social Needs  . Financial resource strain: Not hard at all  . Food insecurity    Worry: Never true    Inability: Never true  . Transportation needs    Medical: No    Non-medical: No  Tobacco Use  . Smoking status: Never Smoker  . Smokeless tobacco: Never Used  Substance and Sexual Activity  . Alcohol use: No    Alcohol/week: 0.0 standard drinks  . Drug use:  No  . Sexual activity: Not on file  Lifestyle  . Physical activity    Days per week: 0 days    Minutes per session: 0 min  . Stress: Not at all  Relationships  . Social Herbalist on phone: Not on file    Gets together: Not on file    Attends religious service: Not on file    Active member of club or organization: Not on file    Attends meetings of clubs or organizations: Not on file    Relationship status: Not on file  Other Topics Concern  . Not on file  Social History Narrative   Lives with husband   Would desire CPR but would not want to be on life support for prolonged period of time if futile.    Outpatient Encounter Medications as of 10/09/2018  Medication Sig  . aspirin EC 81 MG tablet Take 81 mg by mouth daily.  [name] atenolol  (TENORMIN) 50 MG tablet TAKE ONE TABLET BY MOUTH DAILY  . benzonatate (TESSALON) 200 MG capsule Take 1 capsule (200 mg total) by mouth 3 (three) times daily as needed for cough. Do not bite pill  . butalbital-acetaminophen-caffeine (FIORICET, ESGIC) 50-325-40 MG tablet TAKE ONE TABLET BY MOUTH TWICE A DAY AS NEEDED FOR HEADACHE  . Cholecalciferol (VITAMIN D-3) 1000 UNITS CAPS Take 1 capsule by mouth daily.  . fluocinonide-emollient (LIDEX-E) 0.05 % cream Apply 1 application topically 2 (two) times daily as needed.  . fluticasone (FLONASE) 50 MCG/ACT nasal spray Place 2 sprays into both nostrils daily.  [name] ibuprofen (ADVIL) 800 MG tablet Take 1 tablet (800 mg total) by mouth every 8 (eight) hours as needed for moderate pain (with food.).  [name] levothyroxine (SYNTHROID) 100 MCG tablet Take 1 tablet (100 mcg total) by mouth daily.  . Multiple Vitamin (MULTIVITAMIN) tablet Take 1 tablet by mouth daily.    [name] omeprazole (PRILOSEC) 20 MG capsule TAKE ONE CAPSULE BY MOUTH DAILY  . rOPINIRole (REQUIP) 0.25 MG tablet Take 1 tablet (0.25 mg total) by mouth at bedtime as needed.  . simvastatin (ZOCOR) 40 MG tablet Take 1 tablet (40 mg total) by mouth at bedtime.  . SUMAtriptan (IMITREX) 20 MG/ACT nasal spray Place 1 spray into the nose as needed.    . valACYclovir (VALTREX) 1000 MG tablet Take 1 tablet (1,000 mg total) by mouth 3 (three) times daily.   No facility-administered encounter medications on file as of 10/09/2018.     Activities of Daily Living In your present state of health, do you have any difficulty performing the following activities: 10/09/2018  Hearing? N  Vision? N  Difficulty concentrating or making decisions? N  Walking or climbing stairs? N  Dressing or bathing? N  Doing errands, shopping? N  Preparing Food and eating ? N  Using the Toilet? N  In the past six months, have you accidently leaked urine? N  Do you have problems with loss of bowel control? N  Managing your Medications? N   Managing your Finances? N  Housekeeping or managing your Housekeeping? N  Some recent data might be hidden    Patient Care Team: Tonia Ghent, MD as PCP - General (Family Medicine) Odette Fraction as Consulting Physician (Optometry) Ladene Artist, MD as Consulting Physician (Gastroenterology) Danella Sensing, MD as Consulting Physician (Dermatology) Haydee Monica, MD as Consulting Physician (Endocrinology)    Assessment:   This is a routine wellness examination for Yenia.  Exercise Activities  and Dietary recommendations Current Exercise Habits: The patient does not participate in regular exercise at present, Exercise limited by: None identified  Goals    . Increase water intake     Starting 09/26/2017, I will attempt to drink at [facility] of water with each meal.     . Patient Stated     Starting 10/09/2018, Patient wants to increase social interactions.        Fall Risk Fall Risk  10/09/2018 09/26/2017 09/13/2016 09/10/2015 09/09/2014  Falls in the past year? 0 No No No No  Number falls in past yr: 0 - - - -  Injury with Fall? - - - - -  Risk for fall due to : Medication side effect - - - -  Risk for fall due to: Comment - - - - -  Follow up Falls evaluation completed;Falls prevention discussed - - - -   Is the patient's home free of loose throw rugs in walkways, pet beds, electrical cords, etc?   yes      Grab bars in the bathroom? no      Handrails on the stairs?   yes      Adequate lighting?   yes  Timed Get Up and Go performed: n/a  Depression Screen PHQ 2/9 Scores 10/09/2018 09/26/2017 09/13/2016 09/10/2015  PHQ - 2 Score 0 0 0 0  PHQ- 9 Score 0 0 2 -     Cognitive Function MMSE - Mini Mental State Exam 10/09/2018 09/26/2017 09/13/2016  Orientation to time 5 5 5   Orientation to Place 5 5 5   Registration 3 3 3   Attention/ Calculation 5 0 0  Recall 2 3 3   Language- name 2 objects - 0 0  Language- repeat 1 1 1   Language- follow 3 step command - 3 3  Language- read  & follow direction - 0 0  Write a sentence - 0 0  Copy design - 0 0  Total score - 20 20  Mini Cog  Mini-Cog screen was completed. Maximum score is 22. A value of 0 denotes this part of the MMSE was not completed or the patient failed this part of the Mini-Cog screening.      Immunization History  Administered Date(s) Administered  . Influenza Split 10/16/2010  . Influenza Whole 10/23/2007, 10/13/2011  . Influenza, Quadrivalent, Recombinant, Inj, Pf 10/06/2018  . Influenza,inj,Quad PF,6+ Mos 09/21/2012, 10/29/2013, 09/09/2014, 10/10/2015, 11/12/2016, 09/30/2017  . Pneumococcal Conjugate-13 09/03/2013  . Pneumococcal Polysaccharide-23 08/15/2006  . Td 12/25/2002  . Tdap 01/18/2013  . Zoster 02/02/2012  . Zoster Recombinat (Shingrix) 07/14/2018, 10/06/2018    Qualifies for Shingles Vaccine?yes  Screening Tests Health Maintenance  Topic Date Due  . DEXA SCAN  09/13/2048 (Originally 10/05/2005)  . TETANUS/TDAP  01/19/2023  . INFLUENZA VACCINE  Completed  . PNA vac Low Risk Adult  Completed    Cancer Screenings: Lung: Low Dose CT Chest recommended if Age 55-80 years, 30 pack-year currently smoking OR have quit w/in 15years. Patient does not qualify. Breast:  Up to date on Mammogram? Yes, completed 07/18/2018   Up to date of Bone Density/Dexa? No, will discuss with provider  Colorectal: no longer required  Additional Screenings: : Hepatitis C Screening: n/a     Plan:    Patient wants to improve her social interactions.    I have personally reviewed and noted the following in the patient's chart:   . Medical and social history . Use of alcohol, tobacco or illicit drugs  . Current  medications and supplements . Functional ability and status . Nutritional status . Physical activity . Advanced directives . List of other physicians . Hospitalizations, surgeries, and ER visits in previous 12 months . Vitals . Screenings to include cognitive, depression, and falls .  Referrals and appointments  In addition, I have reviewed and discussed with patient certain preventive protocols, quality metrics, and best practice recommendations. A written personalized care plan for preventive services as well as general preventive health recommendations were provided to patient.     Andrez Grime, LPN  [id number]

## 2018-10-11 ENCOUNTER — Other Ambulatory Visit: Payer: Self-pay | Admitting: Family Medicine

## 2018-10-11 ENCOUNTER — Other Ambulatory Visit (INDEPENDENT_AMBULATORY_CARE_PROVIDER_SITE_OTHER): Payer: Medicare Other

## 2018-10-11 ENCOUNTER — Other Ambulatory Visit: Payer: Self-pay

## 2018-10-11 DIAGNOSIS — E039 Hypothyroidism, unspecified: Secondary | ICD-10-CM

## 2018-10-11 DIAGNOSIS — E559 Vitamin D deficiency, unspecified: Secondary | ICD-10-CM

## 2018-10-11 DIAGNOSIS — I10 Essential (primary) hypertension: Secondary | ICD-10-CM

## 2018-10-11 LAB — COMPREHENSIVE METABOLIC PANEL
ALT: 31 U/L (ref 0–35)
AST: 30 U/L (ref 0–37)
Albumin: 3.9 g/dL (ref 3.5–5.2)
Alkaline Phosphatase: 94 U/L (ref 39–117)
BUN: 10 mg/dL (ref 6–23)
CO2: 26 mEq/L (ref 19–32)
Calcium: 9.2 mg/dL (ref 8.4–10.5)
Chloride: 103 mEq/L (ref 96–112)
Creatinine, Ser: 0.61 mg/dL (ref 0.40–1.20)
GFR: 94.86 mL/min (ref >60.00)
Glucose, Bld: 94 mg/dL (ref 70–99)
Potassium: 4.3 mEq/L (ref 3.5–5.1)
Sodium: 139 mEq/L (ref 135–145)
Total Bilirubin: 0.5 mg/dL (ref 0.2–1.2)
Total Protein: 6.7 g/dL (ref 6.0–8.3)

## 2018-10-11 LAB — CBC WITH DIFFERENTIAL/PLATELET
Basophils Absolute: 0.1 10*3/uL (ref 0.0–0.1)
Basophils Relative: 1.1 % (ref 0.0–3.0)
Eosinophils Absolute: 0.4 10*3/uL (ref 0.0–0.7)
Eosinophils Relative: 5.8 % — ABNORMAL HIGH (ref 0.0–5.0)
HCT: 37.5 % (ref 36.0–46.0)
Hemoglobin: 12.5 g/dL (ref 12.0–15.0)
Lymphocytes Relative: 29.8 % (ref 12.0–46.0)
Lymphs Abs: 2.1 10*3/uL (ref 0.7–4.0)
MCHC: 33.4 g/dL (ref 30.0–36.0)
MCV: 96.2 fl (ref 78.0–100.0)
Monocytes Absolute: 0.7 10*3/uL (ref 0.1–1.0)
Monocytes Relative: 10 % (ref 3.0–12.0)
Neutro Abs: 3.8 10*3/uL (ref 1.4–7.7)
Neutrophils Relative %: 53.3 % (ref 43.0–77.0)
Platelets: 283 10*3/uL (ref 150.0–400.0)
RBC: 3.9 Mil/uL (ref 3.87–5.11)
RDW: 13.3 % (ref 11.5–15.5)
WBC: 7.1 10*3/uL (ref 4.0–10.5)

## 2018-10-11 LAB — LIPID PANEL
Cholesterol: 149 mg/dL (ref 0–200)
HDL: 40.1 mg/dL (ref >39.00)
LDL Cholesterol: 77 mg/dL (ref 0–99)
NonHDL: 109
Total CHOL/HDL Ratio: 4
Triglycerides: 158 mg/dL — ABNORMAL HIGH (ref 0.0–149.0)
VLDL: 31.6 mg/dL (ref 0.0–40.0)

## 2018-10-11 LAB — TSH: TSH: 0.76 u[IU]/mL (ref 0.35–4.50)

## 2018-10-11 LAB — VITAMIN D 25 HYDROXY (VIT D DEFICIENCY, FRACTURES): VITD: 39.36 ng/mL (ref 30.00–100.00)

## 2018-10-13 ENCOUNTER — Encounter: Payer: Self-pay | Admitting: Family Medicine

## 2018-10-13 ENCOUNTER — Other Ambulatory Visit: Payer: Self-pay

## 2018-10-13 ENCOUNTER — Telehealth: Payer: Self-pay | Admitting: Family Medicine

## 2018-10-13 ENCOUNTER — Ambulatory Visit (INDEPENDENT_AMBULATORY_CARE_PROVIDER_SITE_OTHER): Payer: Medicare Other | Admitting: Family Medicine

## 2018-10-13 VITALS — BP 150/80 | HR 80 | Temp 97.4°F | Ht 68.5 in | Wt 196.6 lb

## 2018-10-13 DIAGNOSIS — I10 Essential (primary) hypertension: Secondary | ICD-10-CM

## 2018-10-13 DIAGNOSIS — G2581 Restless legs syndrome: Secondary | ICD-10-CM | POA: Diagnosis not present

## 2018-10-13 DIAGNOSIS — E039 Hypothyroidism, unspecified: Secondary | ICD-10-CM | POA: Diagnosis not present

## 2018-10-13 DIAGNOSIS — E785 Hyperlipidemia, unspecified: Secondary | ICD-10-CM

## 2018-10-13 DIAGNOSIS — G43909 Migraine, unspecified, not intractable, without status migrainosus: Secondary | ICD-10-CM

## 2018-10-13 DIAGNOSIS — Z7189 Other specified counseling: Secondary | ICD-10-CM

## 2018-10-13 DIAGNOSIS — Z0000 Encounter for general adult medical examination without abnormal findings: Secondary | ICD-10-CM

## 2018-10-13 DIAGNOSIS — M25569 Pain in unspecified knee: Secondary | ICD-10-CM

## 2018-10-13 DIAGNOSIS — M542 Cervicalgia: Secondary | ICD-10-CM

## 2018-10-13 DIAGNOSIS — Z780 Asymptomatic menopausal state: Secondary | ICD-10-CM

## 2018-10-13 MED ORDER — BUTALBITAL-APAP-CAFFEINE 50-325-40 MG PO TABS: ORAL_TABLET | ORAL | 0 refills | Status: DC

## 2018-10-13 MED ORDER — FLUTICASONE PROPIONATE 50 MCG/ACT NA SUSP: 2.0000 | Freq: Every day | NASAL | 6 refills | Status: DC

## 2018-10-13 MED ORDER — SIMVASTATIN 40 MG PO TABS: 40.0000 mg | ORAL_TABLET | Freq: Every day | ORAL | 3 refills | Status: DC

## 2018-10-13 MED ORDER — ROPINIROLE HCL 0.5 MG PO TABS: 0.5000 mg | ORAL_TABLET | Freq: Every evening | ORAL | 3 refills | Status: DC | PRN

## 2018-10-13 MED ORDER — ATENOLOL 50 MG PO TABS: 50.0000 mg | ORAL_TABLET | Freq: Every day | ORAL | 3 refills | Status: DC

## 2018-10-13 MED ORDER — OMEPRAZOLE 20 MG PO CPDR: 20.0000 mg | DELAYED_RELEASE_CAPSULE | Freq: Every day | ORAL | 3 refills | Status: DC

## 2018-10-13 MED ORDER — LEVOTHYROXINE SODIUM 100 MCG PO TABS: 100.0000 ug | ORAL_TABLET | Freq: Every day | ORAL | 3 refills | Status: DC

## 2018-10-13 NOTE — Patient Instructions (Addendum)
We'll call about the bone density appointment.  Take care.  Glad to see you.   Try taking ibuprofen twice a day for now with food.  See if that helps with neck and knee pain.  Use ice and heat as needed on your neck.  Use ice on your knee.    Try the higher dose of requip --- 0.5mg  at night.  See if that helps with restless leg troubles.  Take care.  Glad to see you.

## 2018-10-13 NOTE — Telephone Encounter (Signed)
Patient's husband,Ronald, called.  Patient was seen today.  Patient went to pick up prescription and when they got home they found out the prescription was for Fluticasone for seasonal allergies.  Jori Moll said patient doesn't have allergies and they're wondering if they received the wrong medication.  Patient uses [facility].

## 2018-10-13 NOTE — Telephone Encounter (Signed)
I asked about this med (along with her others) today.  She had old rx for it and I thought she needed a refill.  If she doesn't, then please cancel the rx and I apologize. Thanks.

## 2018-10-13 NOTE — Progress Notes (Signed)
R sided neck pain, started recently.  Unclear if that pain influenced her BP today.    R knee pain.  She has been painting baseboards but the pain may have been prior to that.  Some nighttime R foot numbness.  No L foot changes.  No weakness.    Hypertension:    Using medication without problems or lightheadedness: yes Chest pain with exertion: no Edema:no Short of breath:no Labs d/w pt.   She'll recheck BP at home and update me if persistently >140/>80  Hypothyroidism.  No neck mass, no dysphagia.  Compliant.  TSH wnl.    Elevated Cholesterol: Using medications without problems: yes Muscle aches:  Some aches, see above.   Diet compliance: encouraged.   Exercise: encouraged  RLS.  Some relief but not complete relief with 0.25mg  requip.  Sx worse at night.   Migraine hx.  Rare use of Fioricet.  Used only as needed.    Husband designated if patient were incapacitated.   Vaccines up to date.   Mammogram 2020.  DXA d/w pt.   Colonoscopy 2012  Meds, vitals, and allergies reviewed.   PMH and SH reviewed  ROS: Per HPI unless specifically indicated in ROS section   GEN: nad, alert and oriented HEENT: ncat NECK: supple w/o LA, right trapezius and right sternocleidomastoid sore but no focal mass otherwise.  No spreading erythema. CV: rrr. PULM: ctab, no inc wob ABD: soft, +bs EXT: no edema SKIN: no acute rash Normal dorsalis pedis pulses. Crepitus on range of motion right knee. Her right proximal calf is slightly tender to palpation as it inserts at the knee.  The calf itself is not tender otherwise.

## 2018-10-13 NOTE — Telephone Encounter (Signed)
Patient advised.

## 2018-10-16 DIAGNOSIS — M25569 Pain in unspecified knee: Secondary | ICD-10-CM | POA: Insufficient documentation

## 2018-10-16 DIAGNOSIS — M542 Cervicalgia: Secondary | ICD-10-CM | POA: Insufficient documentation

## 2018-10-16 NOTE — Assessment & Plan Note (Signed)
No change in meds at this point. She'll recheck BP at home and update me if persistently >140/>80

## 2018-10-16 NOTE — Assessment & Plan Note (Signed)
Rare use of Fioricet.  Used only as needed.   No adverse effect on medication.  Update me as needed.  She agrees.

## 2018-10-16 NOTE — Assessment & Plan Note (Signed)
No neck mass, no dysphagia.  Compliant.  TSH wnl.   Continue as is.

## 2018-10-16 NOTE — Assessment & Plan Note (Signed)
Some relief but not complete relief with 0.25mg  requip.  Sx worse at night.  Increase Requip to 0.5 mg at night and update me as needed.

## 2018-10-16 NOTE — Assessment & Plan Note (Signed)
Likely muscle strain/spasm She can use ice and heat and ibuprofen.  See after visit summary.  Update me as needed.  She agrees.  No emergent symptoms.

## 2018-10-16 NOTE — Assessment & Plan Note (Signed)
Husband designated if patient were incapacitated.   Vaccines up to date.   Mammogram 2020.  DXA d/w pt.   Colonoscopy 2012

## 2018-10-16 NOTE — Assessment & Plan Note (Signed)
Husband designated if patient were incapacitated. 

## 2018-10-16 NOTE — Assessment & Plan Note (Signed)
Continue statin.  Continue work on diet and exercise, encouraged.  Update me as needed.

## 2018-10-16 NOTE — Assessment & Plan Note (Signed)
Likely exacerbated by paining baseboards.  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  She is noted some nighttime right foot numbness without weakness.  She likely has some peripheral nerve compression that is positional.  She can use ice on her knee along with ibuprofen.  See after visit summary.

## 2018-10-20 DIAGNOSIS — N958 Other specified menopausal and perimenopausal disorders: Secondary | ICD-10-CM | POA: Diagnosis not present

## 2018-10-20 DIAGNOSIS — Z780 Asymptomatic menopausal state: Secondary | ICD-10-CM | POA: Diagnosis not present

## 2018-10-20 LAB — HM DEXA SCAN: HM Dexa Scan: NORMAL

## 2018-11-02 ENCOUNTER — Encounter: Payer: Self-pay | Admitting: Family Medicine

## 2018-12-01 ENCOUNTER — Encounter: Payer: Self-pay | Admitting: Family Medicine

## 2019-01-22 ENCOUNTER — Telehealth: Payer: Self-pay | Admitting: *Deleted

## 2019-01-22 NOTE — Telephone Encounter (Signed)
Richardean Canal NP left a voicemail stating that she was doing a home visit today with the patient. Olivia Mackie stated that she was doing a urine test on the patient and it came back positive.  Called Olivia Mackie back to get more information and she was with another patient. Olivia Mackie stated that she will have to call back.

## 2019-01-24 NOTE — Telephone Encounter (Signed)
[name] did not call back, so message was left for her to call the office back with additional information.

## 2019-01-24 NOTE — Telephone Encounter (Signed)
This does not need specific follow-up.  She is not diabetic based on her A1c, her glucose reading at home, or her glucose readings here.  She did not need to have this test done since we were already monitoring her sugar here at the clinic.  The plan would still be to work on healthy diet and exercise, regardless of her sugar.  Thanks.

## 2019-01-24 NOTE — Telephone Encounter (Signed)
Olivia Mackie with [employer] called back stating that she saw the patient Monday and her urine test was positive for glucose. Olivia Mackie stated that did checked her A1C that day and it was 5.4, glucose was 95 and that was about 3 hours after she had eaten. Olivia Mackie stated that patent told her the night before she had eaten a lot of caramel popcorn and is wondering if that could have caused the glucose in her urine test? Olivia Mackie stated that she was out doing a wellness visit on the patient and reports any abnormal results to the patient's PCP. Olivia Mackie stated that she is advising her PCP so that he can follow-up with patient.

## 2019-01-25 NOTE — Telephone Encounter (Signed)
Patient advised.

## 2019-02-12 DIAGNOSIS — H18453 Nodular corneal degeneration, bilateral: Secondary | ICD-10-CM | POA: Diagnosis not present

## 2019-02-12 DIAGNOSIS — H2513 Age-related nuclear cataract, bilateral: Secondary | ICD-10-CM | POA: Diagnosis not present

## 2019-02-12 DIAGNOSIS — H353132 Nonexudative age-related macular degeneration, bilateral, intermediate dry stage: Secondary | ICD-10-CM | POA: Diagnosis not present

## 2019-02-12 DIAGNOSIS — H52223 Regular astigmatism, bilateral: Secondary | ICD-10-CM | POA: Diagnosis not present

## 2019-02-27 DIAGNOSIS — H02834 Dermatochalasis of left upper eyelid: Secondary | ICD-10-CM | POA: Diagnosis not present

## 2019-02-27 DIAGNOSIS — H2513 Age-related nuclear cataract, bilateral: Secondary | ICD-10-CM | POA: Diagnosis not present

## 2019-02-27 DIAGNOSIS — I10 Essential (primary) hypertension: Secondary | ICD-10-CM | POA: Diagnosis not present

## 2019-02-27 DIAGNOSIS — H353131 Nonexudative age-related macular degeneration, bilateral, early dry stage: Secondary | ICD-10-CM | POA: Diagnosis not present

## 2019-02-27 DIAGNOSIS — H25043 Posterior subcapsular polar age-related cataract, bilateral: Secondary | ICD-10-CM | POA: Diagnosis not present

## 2019-03-01 ENCOUNTER — Ambulatory Visit (INDEPENDENT_AMBULATORY_CARE_PROVIDER_SITE_OTHER): Payer: Medicare Other | Admitting: Family Medicine

## 2019-03-01 ENCOUNTER — Encounter: Payer: Self-pay | Admitting: Family Medicine

## 2019-03-01 ENCOUNTER — Other Ambulatory Visit: Payer: Self-pay

## 2019-03-01 DIAGNOSIS — L723 Sebaceous cyst: Secondary | ICD-10-CM | POA: Diagnosis not present

## 2019-03-01 NOTE — Patient Instructions (Signed)
Keep the area covered in the meantime.  Use warm compresses twice a day and then gently try to express any material after that.  If it gets larger, I can drain it.  Take care.  Glad to see you.

## 2019-03-01 NOTE — Progress Notes (Signed)
This visit occurred during the SARS-CoV-2 public health emergency.  Safety protocols were in place, including screening questions prior to the visit, additional usage of staff PPE, and extensive cleaning of exam room while observing appropriate contact time as indicated for disinfecting solutions.  We talked about getting her and her husband signed up for the covid vaccine, done at [facility].    She has cataract surgery pending for 11 days from now.   Irritated and painful sebaceous cyst on the right lateral posterior shoulder/scapular area.  Recently got puffy and tender.  No fevers.  It is already drained some.  We talked about options.  Her daughter has cancer.  Stressors discussed.  Condolences offered.  Meds, vitals, and allergies reviewed.   ROS: Per HPI unless specifically indicated in ROS section   nad ncat rrr ctab R posterior shoulder seb cyst.   The plan was to attempt incision and drainage however she had significant drainage from the lesion during the process of numbing with 1% with lidocaine.  The lesion was expressed without formal incision.  It was clearly decompressed.  She felt better.  There was no opening for packing.  Discussed options with patient.  It is likely less invasive for her to continue with warm compresses and milking the area to try to drain any remaining material.  She is aware that she could have recurrence of the lesion.

## 2019-03-02 NOTE — Assessment & Plan Note (Signed)
The plan was to attempt incision and drainage however she had significant drainage from the lesion during the process of numbing with 1% with lidocaine.  The lesion was expressed without formal incision.  It was clearly decompressed.  She felt better.  There was no opening for packing.  Discussed options with patient.  It is likely less invasive for her to continue with warm compresses and milking the area to try to drain any remaining material.  She is aware that she could have recurrence of the lesion.  She will update me as needed.  At least 30 minutes were devoted to patient care in this encounter (this can potentially include time spent reviewing the patient's file/history, interviewing and examining the patient, counseling/reviewing plan with patient, ordering referrals, ordering tests, reviewing relevant laboratory or x-ray data, and documenting the encounter).

## 2019-03-12 DIAGNOSIS — H2511 Age-related nuclear cataract, right eye: Secondary | ICD-10-CM | POA: Diagnosis not present

## 2019-03-13 ENCOUNTER — Ambulatory Visit: Payer: Medicare Other | Admitting: Cardiovascular Disease

## 2019-03-13 DIAGNOSIS — H2512 Age-related nuclear cataract, left eye: Secondary | ICD-10-CM | POA: Diagnosis not present

## 2019-03-13 DIAGNOSIS — H25012 Cortical age-related cataract, left eye: Secondary | ICD-10-CM | POA: Diagnosis not present

## 2019-03-13 DIAGNOSIS — H25042 Posterior subcapsular polar age-related cataract, left eye: Secondary | ICD-10-CM | POA: Diagnosis not present

## 2019-03-14 ENCOUNTER — Telehealth: Payer: Self-pay

## 2019-03-14 MED ORDER — DIAZEPAM 5 MG PO TABS: 2.5000 mg | ORAL_TABLET | Freq: Two times a day (BID) | ORAL | 0 refills | Status: DC | PRN

## 2019-03-14 NOTE — Telephone Encounter (Signed)
rx sent.  Please give her my profound condolences.  Sedation caution on medication, start with a half a pill at a time.  Please update me as needed.  Thank you.

## 2019-03-14 NOTE — Telephone Encounter (Signed)
Pt notified as instructed and pt voiced understanding. 

## 2019-03-14 NOTE — Telephone Encounter (Signed)
Pt said Dr Damita Dunnings is aware of pts home situation; pts daughter has CA and pt thinks her daughter is near the end of life and request a mild nerve pill for pt to cope to [employer].No SI/HI. Pt said in past has taken valium. Pt will ck with pharmacy later today.

## 2019-03-15 DIAGNOSIS — Z85828 Personal history of other malignant neoplasm of skin: Secondary | ICD-10-CM | POA: Diagnosis not present

## 2019-03-15 DIAGNOSIS — L720 Epidermal cyst: Secondary | ICD-10-CM | POA: Diagnosis not present

## 2019-03-15 DIAGNOSIS — D2261 Melanocytic nevi of right upper limb, including shoulder: Secondary | ICD-10-CM | POA: Diagnosis not present

## 2019-03-15 DIAGNOSIS — L814 Other melanin hyperpigmentation: Secondary | ICD-10-CM | POA: Diagnosis not present

## 2019-03-15 DIAGNOSIS — L821 Other seborrheic keratosis: Secondary | ICD-10-CM | POA: Diagnosis not present

## 2019-03-16 ENCOUNTER — Ambulatory Visit: Payer: Medicare Other | Attending: Internal Medicine

## 2019-03-16 DIAGNOSIS — Z23 Encounter for immunization: Secondary | ICD-10-CM

## 2019-03-16 NOTE — Progress Notes (Signed)
   Covid-19 Vaccination Clinic  Name:  [name]    MRN: [id number] DOB: Sep 19, 1940  03/16/2019  Ms. Bassano was observed post Covid-19 immunization for 15 minutes without incidence. She was provided with Vaccine Information Sheet and instruction to access the V-Safe system.   Ms. Stanko was instructed to call 911 with any severe reactions post vaccine: [name] Difficulty breathing  . Swelling of your face and throat  . A fast heartbeat  . A bad rash all over your body  . Dizziness and weakness    Immunizations Administered    Name Date Dose VIS Date Route   Pfizer COVID-19 Vaccine 03/16/2019  8:14 AM 0.3 mL 12/29/2018 Intramuscular   Manufacturer: [place]   Lot: [id number]   [facility]: [id number]

## 2019-03-25 NOTE — Progress Notes (Signed)
Cardiology Office Note  Date:  03/26/2019   ID:  [name], DOB 1940/09/04, MRN [id number]  PCP:  Tonia Ghent, MD   Chief Complaint  Patient presents with  . New Patient (Initial Visit)    Self ref to establish care. Meds reviewed by the pt. verbally. "doing well."    HPI:  [name] is a 79 year old woman with past medical history of HTN Hyperlipidemia RLS, migraines Nonsmoker Self referred for discussion of cardiac risk factor  In general she reports that she feels well with no complaints Denies any shortness of breath or chest pain on exertion No regular exercise program  Numb feet on month at night, pins and needles  Previous imaging studies reviewed with her CT ABD/pelvis 08/2014, images pulled up and reviewed in detail Very mild aortic atherosclerosis  CT neck 01/2013, unable to pull up images No mention of carotid atherosclerosis  Father died after fall off scaffolding broke both arms  EKG personally reviewed by myself on todays visit Normal sinus rhythm rate 71 bpm no significant ST-T wave changes   Lab Results  Component Value Date   CHOL 149 10/11/2018   HDL 40.10 10/11/2018   LDLCALC 77 10/11/2018   TRIG 158.0 (H) 10/11/2018     PMH:   has a past medical history of Back pain, Endometriosis, HLD (hyperlipidemia), Hypercholesteremia, Hypothyroidism, Migraine, and Venous stasis ulcer ([place]) (11/1997).  PSH:    Past Surgical History:  Procedure Laterality Date  . ABDOMINAL HYSTERECTOMY     1 1/2 ovaries gone, second to endometriosis  . APPENDECTOMY    . CYSTOSCOPY    . HEMORRHOID SURGERY    . HERNIA REPAIR    . ROTATOR CUFF REPAIR    . THYROIDECTOMY      Current Outpatient Medications  Medication Sig Dispense Refill  . aspirin EC 81 MG tablet Take 81 mg by mouth daily.    [name] atenolol (TENORMIN) 50 MG tablet Take 1 tablet (50 mg total) by mouth daily. 90 tablet 3  . benzonatate (TESSALON) 200 MG capsule Take 1 capsule (200 mg  total) by mouth 3 (three) times daily as needed for cough. Do not bite pill 30 capsule 1  . butalbital-acetaminophen-caffeine (FIORICET) 50-325-40 MG tablet TAKE ONE TABLET BY MOUTH TWICE A DAY AS NEEDED FOR HEADACHE 60 tablet 0  . Cholecalciferol (VITAMIN D-3) 1000 UNITS CAPS Take 1 capsule by mouth daily.    . diazepam (VALIUM) 5 MG tablet Take 0.5-1 tablets (2.5-5 mg total) by mouth every 12 (twelve) hours as needed for anxiety. 20 tablet 0  . fluocinonide-emollient (LIDEX-E) 0.05 % cream Apply 1 application topically 2 (two) times daily as needed. 30 g 0  . fluticasone (FLONASE) 50 MCG/ACT nasal spray Place 2 sprays into both nostrils daily. 16 g 6  . ibuprofen (ADVIL) 800 MG tablet Take 1 tablet (800 mg total) by mouth every 8 (eight) hours as needed for moderate pain (with food.). 180 tablet 1  . levothyroxine (SYNTHROID) 100 MCG tablet Take 1 tablet (100 mcg total) by mouth daily. 90 tablet 3  . Multiple Vitamin (MULTIVITAMIN) tablet Take 1 tablet by mouth daily.      [name] omeprazole (PRILOSEC) 20 MG capsule Take 1 capsule (20 mg total) by mouth daily. 90 capsule 3  . rOPINIRole (REQUIP) 0.5 MG tablet Take 1 tablet (0.5 mg total) by mouth at bedtime as needed. 90 tablet 3  . simvastatin (ZOCOR) 40 MG tablet Take 1 tablet (40 mg total) by  mouth at bedtime. 90 tablet 3   No current facility-administered medications for this visit.     Allergies:   Codeine and Tramadol   Social History:  The patient  reports that she has never smoked. She has never used smokeless tobacco. She reports that she does not drink alcohol or use drugs.   Family History:   family history includes Breast cancer in her daughter; Goiter in her mother; Heart attack in her brother and father; Heart disease in her father; Kidney disease in her brother; Stomach cancer in her sister; Stroke in her mother.    Review of Systems: Review of Systems  Constitutional: Negative.   HENT: Negative.   Respiratory: Negative.    Cardiovascular: Negative.   Gastrointestinal: Negative.   Musculoskeletal: Negative.   Neurological: Negative.   Psychiatric/Behavioral: Negative.   All other systems reviewed and are negative.    PHYSICAL EXAM: VS:  BP 134/70 (BP Location: Right Arm, Patient Position: Sitting, Cuff Size: Normal)   Pulse 71   Temp 98.1 F (36.7 C)   Ht 5\' 8"  (1.727 m)   Wt 194 lb 8 oz (88.2 kg)   SpO2 98%   BMI 29.57 kg/m  , BMI Body mass index is 29.57 kg/m. GEN: Well nourished, well developed, in no acute distress HEENT: normal Neck: no JVD, carotid bruits, or masses Cardiac: RRR; no murmurs, rubs, or gallops,no edema  Respiratory:  clear to auscultation bilaterally, normal work of breathing GI: soft, nontender, nondistended, + BS MS: no deformity or atrophy Skin: warm and dry, no rash Neuro:  Strength and sensation are intact Psych: euthymic mood, full affect    Recent Labs: 10/11/2018: ALT 31; BUN 10; Creatinine, Ser 0.61; Hemoglobin 12.5; Platelets 283.0; Potassium 4.3; Sodium 139; TSH 0.76    Lipid Panel Lab Results  Component Value Date   CHOL 149 10/11/2018   HDL 40.10 10/11/2018   LDLCALC 77 10/11/2018   TRIG 158.0 (H) 10/11/2018      Wt Readings from Last 3 Encounters:  03/26/19 194 lb 8 oz (88.2 kg)  03/01/19 193 lb 8 oz (87.8 kg)  10/13/18 196 lb 9 oz (89.2 kg)       ASSESSMENT AND PLAN:  Problem List Items Addressed This Visit      Cardiology Problems   HTN (hypertension) - Primary   Relevant Orders   EKG 12-Lead   HLD (hyperlipidemia)   Relevant Orders   EKG 12-Lead     Other   Leg swelling     Encounter for preventive care Very mild aortic atherosclerosis seen on CT scan 2016 Recommend she continue her cholesterol medication She is non-smoker, nondiabetic Overall in good shape heading forward  Essential hypertension Blood pressure stable on current medications Recommend she monitor blood pressure at home and if it runs low call our  office  Hyperlipidemia Cholesterol is at goal on the current lipid regimen. No changes to the medications were made.  Leg swelling Stable, recommended leg elevation and compression hose   Disposition:   F/U as needed   Total encounter time more than 45 minutes  Greater than 50% was spent in counseling and coordination of care with the patient    Signed, Esmond Plants, M.D., Ph.D. [place], [place]

## 2019-03-26 ENCOUNTER — Ambulatory Visit (INDEPENDENT_AMBULATORY_CARE_PROVIDER_SITE_OTHER): Payer: Medicare Other | Admitting: Cardiovascular Disease

## 2019-03-26 ENCOUNTER — Ambulatory Visit: Payer: Medicare Other | Admitting: Cardiovascular Disease

## 2019-03-26 ENCOUNTER — Other Ambulatory Visit: Payer: Self-pay

## 2019-03-26 ENCOUNTER — Encounter: Payer: Self-pay | Admitting: Cardiovascular Disease

## 2019-03-26 VITALS — BP 134/70 | HR 71 | Temp 98.1°F | Ht 68.0 in | Wt 194.5 lb

## 2019-03-26 DIAGNOSIS — I700 Atherosclerosis of aorta: Secondary | ICD-10-CM

## 2019-03-26 DIAGNOSIS — E782 Mixed hyperlipidemia: Secondary | ICD-10-CM | POA: Diagnosis not present

## 2019-03-26 DIAGNOSIS — M7989 Other specified soft tissue disorders: Secondary | ICD-10-CM | POA: Diagnosis not present

## 2019-03-26 DIAGNOSIS — I10 Essential (primary) hypertension: Secondary | ICD-10-CM

## 2019-03-26 NOTE — Patient Instructions (Signed)

## 2019-04-02 DIAGNOSIS — H2512 Age-related nuclear cataract, left eye: Secondary | ICD-10-CM | POA: Diagnosis not present

## 2019-04-10 ENCOUNTER — Ambulatory Visit: Payer: Medicare Other | Attending: Internal Medicine

## 2019-04-10 DIAGNOSIS — Z23 Encounter for immunization: Secondary | ICD-10-CM

## 2019-04-10 NOTE — Progress Notes (Signed)
   Covid-19 Vaccination Clinic  Name:  [name]    MRN: [id number] DOB: 08-29-1940  04/10/2019  [name] was observed post Covid-19 immunization for 15 minutes without incident. She was provided with Vaccine Information Sheet and instruction to access the V-Safe system.   [name] was instructed to call 911 with any severe reactions post vaccine: [name] Difficulty breathing  . Swelling of face and throat  . A fast heartbeat  . A bad rash all over body  . Dizziness and weakness   Immunizations Administered    Name Date Dose VIS Date Route   Pfizer COVID-19 Vaccine 04/10/2019  8:33 AM 0.3 mL 12/29/2018 Intramuscular   Manufacturer: [place]   Lot: [id number]   [place]: [id number]

## 2019-07-24 DIAGNOSIS — Z1231 Encounter for screening mammogram for malignant neoplasm of breast: Secondary | ICD-10-CM | POA: Diagnosis not present

## 2019-07-24 LAB — HM MAMMOGRAPHY

## 2019-08-02 ENCOUNTER — Encounter: Payer: Self-pay | Admitting: Family Medicine

## 2019-08-10 ENCOUNTER — Telehealth (INDEPENDENT_AMBULATORY_CARE_PROVIDER_SITE_OTHER): Payer: Self-pay | Admitting: Vascular Surgery

## 2019-08-10 NOTE — Telephone Encounter (Signed)
Bring her in with ABIs and RLE DVT study.  If the pain worsens over the weekend she should go to the ED.  I would also discuss with PCP as this could be a non vascular issue.  Me or Dew within the next week or so

## 2019-08-10 NOTE — Telephone Encounter (Signed)
A detail message was left on patient voicemail with medical advice and patient has been schedule to come in the office.

## 2019-08-10 NOTE — Telephone Encounter (Signed)
Called stating that she has been having trouble with her right leg for the past 3-4 days, behind the knee (in the bend of the knee) she says is so painful and at night her feet goes numb. Patient would like to come in to be seen. Patient was last seen 09-09-2017. Please advise.

## 2019-08-15 DIAGNOSIS — H35373 Puckering of macula, bilateral: Secondary | ICD-10-CM | POA: Diagnosis not present

## 2019-08-15 DIAGNOSIS — H353132 Nonexudative age-related macular degeneration, bilateral, intermediate dry stage: Secondary | ICD-10-CM | POA: Diagnosis not present

## 2019-08-23 ENCOUNTER — Other Ambulatory Visit (INDEPENDENT_AMBULATORY_CARE_PROVIDER_SITE_OTHER): Payer: Self-pay | Admitting: Nurse Practitioner

## 2019-08-23 DIAGNOSIS — M79604 Pain in right leg: Secondary | ICD-10-CM

## 2019-08-23 DIAGNOSIS — R200 Anesthesia of skin: Secondary | ICD-10-CM

## 2019-08-24 ENCOUNTER — Ambulatory Visit (INDEPENDENT_AMBULATORY_CARE_PROVIDER_SITE_OTHER): Payer: Medicare Other

## 2019-08-24 ENCOUNTER — Ambulatory Visit (INDEPENDENT_AMBULATORY_CARE_PROVIDER_SITE_OTHER): Payer: Medicare Other | Admitting: Nurse Practitioner

## 2019-08-24 ENCOUNTER — Encounter (INDEPENDENT_AMBULATORY_CARE_PROVIDER_SITE_OTHER): Payer: Self-pay | Admitting: Nurse Practitioner

## 2019-08-24 ENCOUNTER — Other Ambulatory Visit: Payer: Self-pay

## 2019-08-24 VITALS — BP 137/58 | HR 64 | Resp 16 | Ht 68.0 in | Wt 196.0 lb

## 2019-08-24 DIAGNOSIS — M7989 Other specified soft tissue disorders: Secondary | ICD-10-CM

## 2019-08-24 DIAGNOSIS — E782 Mixed hyperlipidemia: Secondary | ICD-10-CM | POA: Diagnosis not present

## 2019-08-24 DIAGNOSIS — I10 Essential (primary) hypertension: Secondary | ICD-10-CM | POA: Diagnosis not present

## 2019-08-24 DIAGNOSIS — M79604 Pain in right leg: Secondary | ICD-10-CM

## 2019-08-24 DIAGNOSIS — R200 Anesthesia of skin: Secondary | ICD-10-CM | POA: Diagnosis not present

## 2019-08-28 ENCOUNTER — Encounter (INDEPENDENT_AMBULATORY_CARE_PROVIDER_SITE_OTHER): Payer: Self-pay | Admitting: Nurse Practitioner

## 2019-08-28 NOTE — Progress Notes (Signed)
Subjective:    Patient ID: [name], female    DOB: [birth year], 79 y.o.   MRN: [id number] Chief Complaint  Patient presents with  . Follow-up    ultrasound    The patient returns her office today regarding right lower extremity pain and leg swelling.  The patient notes that there was swelling behind her knee and felt as if there is some numbness and tingling in her toes.  She also had difficulty with bending her knee.  Today she notes that this has mostly resolved at this time.  She denies any discoloration of her lower extremities.  The numbness and tingling have also resolved.  She denies any trauma to the area or any specific precipitating event.  She denies any TIA or amaurosis fugax-like symptoms.  Today noninvasive studies show an ABI of 1.20 on the right and 1.22 on the left.  The patient has triphasic tibial artery waveforms bilaterally with good toe waveforms.  The patient also underwent a right lower extremity DVT study which showed no evidence of DVT in the right lower extremity.  The patient had previously had a great saphenous vein ablation which appears to be patent as well.   Review of Systems  Cardiovascular: Positive for leg swelling.  Musculoskeletal: Positive for joint swelling.  All other systems reviewed and are negative.      Objective:   Physical Exam Vitals reviewed.  HENT:     Head: Normocephalic.  Cardiovascular:     Rate and Rhythm: Normal rate and regular rhythm.     Pulses: Normal pulses.  Pulmonary:     Effort: Pulmonary effort is normal.  Musculoskeletal:        General: Swelling present. Normal range of motion.  Skin:    General: Skin is warm and dry.  Neurological:     Mental Status: She is alert and oriented to person, place, and time.  Psychiatric:        Mood and Affect: Mood normal.        Behavior: Behavior normal.        Thought Content: Thought content normal.        Judgment: Judgment normal.     BP (!) 137/58 (BP  Location: Right Arm)   Pulse 64   Resp 16   Ht 5\' 8"  (1.727 m)   Wt 196 lb (88.9 kg)   BMI 29.80 kg/m   Past Medical History:  Diagnosis Date  . Back pain   . Endometriosis   . HLD (hyperlipidemia)   . Hypercholesteremia   . Hypothyroidism   . Migraine   . Venous stasis ulcer ([place]) 11/1997   Wound care center, [place]    Social History   Socioeconomic History  . Marital status: Married    Spouse name: Not on file  . Number of children: 2  . Years of education: Not on file  . Highest education level: Not on file  Occupational History  . Occupation: Retired    Fish farm manager: RETIRED  Tobacco Use  . Smoking status: Never Smoker  . Smokeless tobacco: Never Used  Vaping Use  . Vaping Use: Never used  Substance and Sexual Activity  . Alcohol use: No    Alcohol/week: 0.0 standard drinks  . Drug use: No  . Sexual activity: Not on file  Other Topics Concern  . Not on file  Social History Narrative   Lives with husband   Would desire CPR but would not want to be on  life support for prolonged period of time if futile.   Social Determinants of Health   Financial Resource Strain: Low Risk   . Difficulty of Paying Living Expenses: Not hard at all  Food Insecurity: No Food Insecurity  . Worried About Charity fundraiser in the Last Year: Never true  . Ran Out of Food in the Last Year: Never true  Transportation Needs: No Transportation Needs  . Lack of Transportation (Medical): No  . Lack of Transportation (Non-Medical): No  Physical Activity: Inactive  . Days of Exercise per Week: 0 days  . Minutes of Exercise per Session: 0 min  Stress: No Stress Concern Present  . Feeling of Stress : Not at all  Social Connections:   . Frequency of Communication with Friends and Family:   . Frequency of Social Gatherings with Friends and Family:   . Attends Religious Services:   . Active Member of Clubs or Organizations:   . Attends Archivist Meetings:   [name] Marital  Status:   Intimate Partner Violence: Not At Risk  . Fear of Current or Ex-Partner: No  . Emotionally Abused: No  . Physically Abused: No  . Sexually Abused: No    Past Surgical History:  Procedure Laterality Date  . ABDOMINAL HYSTERECTOMY     1 1/2 ovaries gone, second to endometriosis  . APPENDECTOMY    . CYSTOSCOPY    . HEMORRHOID SURGERY    . HERNIA REPAIR    . ROTATOR CUFF REPAIR    . THYROIDECTOMY      Family History  Problem Relation Age of Onset  . Stroke Mother   . Goiter Mother   . Stomach cancer Sister   . Heart attack Father   . Heart disease Father   . Heart attack Brother   . Kidney disease Brother   . Breast cancer Daughter   . Colon cancer Neg Hx   . Esophageal cancer Neg Hx     Allergies  Allergen Reactions  . Codeine     REACTION: NAUSEA AND VOMITING  . Tramadol     Vomiting, LOC       Assessment & Plan:   1. Leg swelling The patient's left lower extremity symptoms have resolved at this time.  There is no evidence of DVT on ultrasound.  However, no Baker's cyst was visualized either.  It is possible the patient had a ruptured Baker's cyst that caused some of the pain and discomfort behind her knee.  The patient also notes that she had swelling throughout her leg that is also mostly resolved.  The patient will follow up with [place] again if it recurs. 2. Pain of right lower extremity Recommend:  I do not find evidence of Vascular pathology that would explain the patient's symptoms  The patient has atypical pain symptoms for vascular disease  I do not find evidence of Vascular pathology that would explain the patient's symptoms.   The patient should continue walking and begin a more formal exercise program. The patient should continue his antiplatelet therapy and aggressive treatment of the lipid abnormalities. The patient should begin wearing graduated compression socks 15-20 mmHg strength to control her mild edema.  Patient will follow up on  an as-needed basis.    3. Mixed hyperlipidemia Continue statin as ordered and reviewed, no changes at this time   4. Essential hypertension Continue antihypertensive medications as already ordered, these medications have been reviewed and there are no changes at this time.  Current Outpatient Medications on File Prior to Visit  Medication Sig Dispense Refill  . aspirin EC 81 MG tablet Take 81 mg by mouth daily.    [name] atenolol (TENORMIN) 50 MG tablet Take 1 tablet (50 mg total) by mouth daily. 90 tablet 3  . benzonatate (TESSALON) 200 MG capsule Take 1 capsule (200 mg total) by mouth 3 (three) times daily as needed for cough. Do not bite pill 30 capsule 1  . butalbital-acetaminophen-caffeine (FIORICET) 50-325-40 MG tablet TAKE ONE TABLET BY MOUTH TWICE A DAY AS NEEDED FOR HEADACHE 60 tablet 0  . Cholecalciferol (VITAMIN D-3) 1000 UNITS CAPS Take 1 capsule by mouth daily.    . diazepam (VALIUM) 5 MG tablet Take 0.5-1 tablets (2.5-5 mg total) by mouth every 12 (twelve) hours as needed for anxiety. 20 tablet 0  . fluocinonide-emollient (LIDEX-E) 0.05 % cream Apply 1 application topically 2 (two) times daily as needed. 30 g 0  . ibuprofen (ADVIL) 800 MG tablet Take 1 tablet (800 mg total) by mouth every 8 (eight) hours as needed for moderate pain (with food.). 180 tablet 1  . levothyroxine (SYNTHROID) 100 MCG tablet Take 1 tablet (100 mcg total) by mouth daily. 90 tablet 3  . Multiple Vitamin (MULTIVITAMIN) tablet Take 1 tablet by mouth daily.      [name] omeprazole (PRILOSEC) 20 MG capsule Take 1 capsule (20 mg total) by mouth daily. 90 capsule 3  . rOPINIRole (REQUIP) 0.5 MG tablet Take 1 tablet (0.5 mg total) by mouth at bedtime as needed. 90 tablet 3  . simvastatin (ZOCOR) 40 MG tablet Take 1 tablet (40 mg total) by mouth at bedtime. 90 tablet 3  . fluticasone (FLONASE) 50 MCG/ACT nasal spray Place 2 sprays into both nostrils daily. (Patient not taking: Reported on 08/24/2019) 16 g 6   No  current facility-administered medications on file prior to visit.    There are no Patient Instructions on file for this visit. No follow-ups on file.   Kris Hartmann, NP

## 2019-09-06 ENCOUNTER — Other Ambulatory Visit: Payer: Self-pay | Admitting: Family Medicine

## 2019-09-06 NOTE — Telephone Encounter (Signed)
Electronic refill request. IBP 800 mg Last office visit:   07/24/2019 Last Filled:    180 tablet 1 07/11/2018  Please advise.

## 2019-09-07 NOTE — Telephone Encounter (Signed)
Sent. Thanks.   

## 2019-09-13 ENCOUNTER — Telehealth: Payer: Self-pay | Admitting: Family Medicine

## 2019-09-13 NOTE — Telephone Encounter (Signed)
D/w pt about options.  She has inc in RLS sx with 0.5mg  requip at night.  D/w pt about inc to 2 tabs at night in the meantime with f/u pending for next month.  She agrees.

## 2019-10-03 ENCOUNTER — Other Ambulatory Visit: Payer: Self-pay | Admitting: Family Medicine

## 2019-10-03 DIAGNOSIS — E559 Vitamin D deficiency, unspecified: Secondary | ICD-10-CM

## 2019-10-03 DIAGNOSIS — I10 Essential (primary) hypertension: Secondary | ICD-10-CM

## 2019-10-03 DIAGNOSIS — E039 Hypothyroidism, unspecified: Secondary | ICD-10-CM

## 2019-10-03 DIAGNOSIS — G2581 Restless legs syndrome: Secondary | ICD-10-CM

## 2019-10-06 ENCOUNTER — Other Ambulatory Visit: Payer: Self-pay

## 2019-10-06 ENCOUNTER — Ambulatory Visit (INDEPENDENT_AMBULATORY_CARE_PROVIDER_SITE_OTHER): Payer: Medicare Other

## 2019-10-06 DIAGNOSIS — Z23 Encounter for immunization: Secondary | ICD-10-CM | POA: Diagnosis not present

## 2019-10-08 ENCOUNTER — Other Ambulatory Visit: Payer: Self-pay

## 2019-10-08 ENCOUNTER — Other Ambulatory Visit (INDEPENDENT_AMBULATORY_CARE_PROVIDER_SITE_OTHER): Payer: Medicare Other

## 2019-10-08 DIAGNOSIS — I10 Essential (primary) hypertension: Secondary | ICD-10-CM | POA: Diagnosis not present

## 2019-10-08 DIAGNOSIS — E039 Hypothyroidism, unspecified: Secondary | ICD-10-CM

## 2019-10-08 DIAGNOSIS — E559 Vitamin D deficiency, unspecified: Secondary | ICD-10-CM | POA: Diagnosis not present

## 2019-10-08 DIAGNOSIS — G2581 Restless legs syndrome: Secondary | ICD-10-CM

## 2019-10-08 LAB — COMPREHENSIVE METABOLIC PANEL
ALT: 39 U/L — ABNORMAL HIGH (ref 0–35)
AST: 38 U/L — ABNORMAL HIGH (ref 0–37)
Albumin: 4 g/dL (ref 3.5–5.2)
Alkaline Phosphatase: 102 U/L (ref 39–117)
BUN: 13 mg/dL (ref 6–23)
CO2: 28 mEq/L (ref 19–32)
Calcium: 8.8 mg/dL (ref 8.4–10.5)
Chloride: 105 mEq/L (ref 96–112)
Creatinine, Ser: 0.67 mg/dL (ref 0.40–1.20)
GFR: 84.9 mL/min (ref >60.00)
Glucose, Bld: 105 mg/dL — ABNORMAL HIGH (ref 70–99)
Potassium: 4 mEq/L (ref 3.5–5.1)
Sodium: 141 mEq/L (ref 135–145)
Total Bilirubin: 0.5 mg/dL (ref 0.2–1.2)
Total Protein: 6.4 g/dL (ref 6.0–8.3)

## 2019-10-08 LAB — CBC WITH DIFFERENTIAL/PLATELET
Basophils Absolute: 0.1 10*3/uL (ref 0.0–0.1)
Basophils Relative: 1.3 % (ref 0.0–3.0)
Eosinophils Absolute: 0.4 10*3/uL (ref 0.0–0.7)
Eosinophils Relative: 7.3 % — ABNORMAL HIGH (ref 0.0–5.0)
HCT: 36.9 % (ref 36.0–46.0)
Hemoglobin: 12.4 g/dL (ref 12.0–15.0)
Lymphocytes Relative: 26 % (ref 12.0–46.0)
Lymphs Abs: 1.5 10*3/uL (ref 0.7–4.0)
MCHC: 33.7 g/dL (ref 30.0–36.0)
MCV: 95.6 fl (ref 78.0–100.0)
Monocytes Absolute: 0.7 10*3/uL (ref 0.1–1.0)
Monocytes Relative: 13.1 % — ABNORMAL HIGH (ref 3.0–12.0)
Neutro Abs: 3 10*3/uL (ref 1.4–7.7)
Neutrophils Relative %: 52.3 % (ref 43.0–77.0)
Platelets: 256 10*3/uL (ref 150.0–400.0)
RBC: 3.86 Mil/uL — ABNORMAL LOW (ref 3.87–5.11)
RDW: 13 % (ref 11.5–15.5)
WBC: 5.7 10*3/uL (ref 4.0–10.5)

## 2019-10-08 LAB — TSH: TSH: 0.29 u[IU]/mL — ABNORMAL LOW (ref 0.35–4.50)

## 2019-10-08 LAB — LIPID PANEL
Cholesterol: 166 mg/dL (ref 0–200)
HDL: 40.7 mg/dL (ref >39.00)
LDL Cholesterol: 87 mg/dL (ref 0–99)
NonHDL: 125.24
Total CHOL/HDL Ratio: 4
Triglycerides: 193 mg/dL — ABNORMAL HIGH (ref 0.0–149.0)
VLDL: 38.6 mg/dL (ref 0.0–40.0)

## 2019-10-08 LAB — VITAMIN D 25 HYDROXY (VIT D DEFICIENCY, FRACTURES): VITD: 24.78 ng/mL — ABNORMAL LOW (ref 30.00–100.00)

## 2019-10-09 ENCOUNTER — Ambulatory Visit: Payer: Medicare Other

## 2019-10-15 ENCOUNTER — Ambulatory Visit (INDEPENDENT_AMBULATORY_CARE_PROVIDER_SITE_OTHER): Payer: Medicare Other | Admitting: Family Medicine

## 2019-10-15 ENCOUNTER — Encounter: Payer: Self-pay | Admitting: Family Medicine

## 2019-10-15 ENCOUNTER — Other Ambulatory Visit: Payer: Self-pay

## 2019-10-15 VITALS — BP 150/64 | HR 78 | Temp 96.1°F | Ht 68.0 in | Wt 199.1 lb

## 2019-10-15 DIAGNOSIS — E559 Vitamin D deficiency, unspecified: Secondary | ICD-10-CM

## 2019-10-15 DIAGNOSIS — F419 Anxiety disorder, unspecified: Secondary | ICD-10-CM | POA: Diagnosis not present

## 2019-10-15 DIAGNOSIS — G2581 Restless legs syndrome: Secondary | ICD-10-CM | POA: Diagnosis not present

## 2019-10-15 DIAGNOSIS — Z7189 Other specified counseling: Secondary | ICD-10-CM

## 2019-10-15 DIAGNOSIS — K760 Fatty (change of) liver, not elsewhere classified: Secondary | ICD-10-CM

## 2019-10-15 DIAGNOSIS — I10 Essential (primary) hypertension: Secondary | ICD-10-CM

## 2019-10-15 DIAGNOSIS — E039 Hypothyroidism, unspecified: Secondary | ICD-10-CM | POA: Diagnosis not present

## 2019-10-15 DIAGNOSIS — Z0000 Encounter for general adult medical examination without abnormal findings: Secondary | ICD-10-CM

## 2019-10-15 DIAGNOSIS — E782 Mixed hyperlipidemia: Secondary | ICD-10-CM

## 2019-10-15 MED ORDER — LEVOTHYROXINE SODIUM 100 MCG PO TABS
ORAL_TABLET | ORAL | 3 refills | Status: DC
Start: 2019-10-15 — End: 2020-10-16

## 2019-10-15 MED ORDER — FLUOCINONIDE EMULSIFIED BASE 0.05 % EX CREA: 1.0000 "application " | TOPICAL_CREAM | Freq: Two times a day (BID) | CUTANEOUS | 2 refills | Status: DC

## 2019-10-15 MED ORDER — ATENOLOL 50 MG PO TABS
25.0000 mg | ORAL_TABLET | Freq: Every day | ORAL | 3 refills | Status: DC
Start: 2019-10-15 — End: 2020-10-16

## 2019-10-15 MED ORDER — ROPINIROLE HCL 1 MG PO TABS
1.0000 mg | ORAL_TABLET | Freq: Every evening | ORAL | 3 refills | Status: DC | PRN
Start: 2019-10-15 — End: 2020-04-01

## 2019-10-15 MED ORDER — DIAZEPAM 5 MG PO TABS
2.5000 mg | ORAL_TABLET | Freq: Two times a day (BID) | ORAL | 1 refills | Status: DC | PRN
Start: 2019-10-15 — End: 2020-09-03

## 2019-10-15 MED ORDER — VITAMIN D 50 MCG (2000 UT) PO CAPS: 2000.0000 [IU] | ORAL_CAPSULE | Freq: Every day | ORAL | Status: AC

## 2019-10-15 MED ORDER — SIMVASTATIN 40 MG PO TABS
40.0000 mg | ORAL_TABLET | Freq: Every day | ORAL | 3 refills | Status: DC
Start: 2019-10-15 — End: 2020-10-13

## 2019-10-15 NOTE — Patient Instructions (Addendum)
If you keep getting lightheaded, then cut the atenolol back to 1/2 tab a day.   Change levothyroxine to 1 a day except for 1/2 on Sundays.  Total of 6.5 tabs in a week.   Increase requip to 1mg  at night.    Start taking 2000 units of vitamin D a day.   Recheck labs in about 3 months.  Nonfasting lab visit.    Stop aspirin.    Take care.  Glad to see you.

## 2019-10-15 NOTE — Progress Notes (Signed)
This visit occurred during the SARS-CoV-2 public health emergency.  Safety protocols were in place, including screening questions prior to the visit, additional usage of staff PPE, and extensive cleaning of exam room while observing appropriate contact time as indicated for disinfecting solutions.  Husband designated if patient were incapacitated. Vaccines up to date.  Colonoscopy 2012 Mammogram 2021  Vitamin D low- she may have been off replacement.  D/w pt about restart at 2000 IU a day.    TSH low- compliant.  No neck mass, no dysphagia.  D/w pt about lowering her dose and recheck in about 3 months.    LFTs slightly elevated.  Previous CT with suspected mild steatosis   Anxiety.  Her daughter has cancer with f/u pending.  Stressors d/w pt.  BZD used prn.  No adverse effect on medication.  RLS.  Prev with relief from requip, less so recently.  Compliant.  No adverse effect on medication.  Hypertension:    Using medication without problems or lightheadedness: can get lightheaded on standing.   Chest pain with exertion:no Edema:no Short of breath:no Labs d/w pt.    Lidex cream helped.  No ADE on med.    Elevated Cholesterol: Using medications without problems: yes Muscle aches: no Diet compliance: encouraged.   Exercise: encouraged Labs d/w pt.    PMH and SH reviewed  Meds, vitals, and allergies reviewed.   ROS: Per HPI unless specifically indicated in ROS section   GEN: nad, alert and oriented HEENT: ncat NECK: supple w/o LA CV: rrr. PULM: ctab, no inc wob ABD: soft, +bs EXT: no edema SKIN: no acute rash

## 2019-10-17 DIAGNOSIS — K760 Fatty (change of) liver, not elsewhere classified: Secondary | ICD-10-CM | POA: Insufficient documentation

## 2019-10-17 NOTE — Assessment & Plan Note (Signed)
Husband designated if patient were incapacitated. Vaccines up to date.  Colonoscopy 2012 Mammogram 2021

## 2019-10-17 NOTE — Assessment & Plan Note (Signed)
Husband designated if patient were incapacitated. 

## 2019-10-17 NOTE — Assessment & Plan Note (Signed)
  TSH low-  No neck mass, no dysphagia.  D/w pt about lowering her dose and recheck in about 3 months.

## 2019-10-17 NOTE — Assessment & Plan Note (Signed)
Vitamin D low- she may have been off replacement.  D/w pt about restart at 2000 IU a day.

## 2019-10-18 DIAGNOSIS — F419 Anxiety disorder, unspecified: Secondary | ICD-10-CM | POA: Insufficient documentation

## 2019-10-18 NOTE — Assessment & Plan Note (Signed)
Continue simvastatin.  Continue work on diet and exercise.  Labs discussed patient.

## 2019-10-18 NOTE — Assessment & Plan Note (Signed)
LFTs slightly elevated.  Previous CT with suspected mild steatosis.  Discussed with patient about gradual weight loss goals.

## 2019-10-18 NOTE — Assessment & Plan Note (Signed)
Her daughter has cancer with f/u pending.  Stressors d/w pt.  BZD used prn.  No adverse effect on medication.  Continue benzodiazepine as needed.

## 2019-10-18 NOTE — Assessment & Plan Note (Signed)
If lightheaded then cut atenolol half.  Update me as needed otherwise.  She agrees.  Labs discussed with patient.

## 2019-10-18 NOTE — Assessment & Plan Note (Signed)
Prev with relief from requip, less so recently.  Compliant.  No adverse effect on medication.  Increase Requip to 1 mg a day.

## 2019-11-11 ENCOUNTER — Other Ambulatory Visit: Payer: Self-pay | Admitting: Family Medicine

## 2019-11-13 DIAGNOSIS — R1032 Left lower quadrant pain: Secondary | ICD-10-CM | POA: Diagnosis not present

## 2019-11-30 ENCOUNTER — Telehealth: Payer: Self-pay

## 2019-11-30 ENCOUNTER — Encounter: Payer: Self-pay | Admitting: Family Medicine

## 2019-11-30 NOTE — Telephone Encounter (Signed)
Per Dr. Damita Dunnings, bone density scan was normal and she needs to continue oral vit D.    Spoke with pt relaying results and Dr. Josefine Class message.  Pt verbalizes understanding.

## 2020-01-14 ENCOUNTER — Other Ambulatory Visit: Payer: Self-pay

## 2020-01-14 ENCOUNTER — Other Ambulatory Visit (INDEPENDENT_AMBULATORY_CARE_PROVIDER_SITE_OTHER): Payer: Medicare Other

## 2020-01-14 DIAGNOSIS — E039 Hypothyroidism, unspecified: Secondary | ICD-10-CM | POA: Diagnosis not present

## 2020-01-14 DIAGNOSIS — E559 Vitamin D deficiency, unspecified: Secondary | ICD-10-CM | POA: Diagnosis not present

## 2020-01-14 LAB — TSH: TSH: 2.4 u[IU]/mL (ref 0.35–4.50)

## 2020-01-14 LAB — VITAMIN D 25 HYDROXY (VIT D DEFICIENCY, FRACTURES): VITD: 40.79 ng/mL (ref 30.00–100.00)

## 2020-01-17 ENCOUNTER — Other Ambulatory Visit: Payer: Self-pay

## 2020-01-17 ENCOUNTER — Telehealth: Payer: Self-pay | Admitting: Cardiovascular Disease

## 2020-01-17 ENCOUNTER — Emergency Department: Payer: Medicare Other

## 2020-01-17 ENCOUNTER — Emergency Department
Admission: EM | Admit: 2020-01-17 | Discharge: 2020-01-17 | Disposition: A | Discharge status: Home / Self Care | Payer: Medicare Other | Attending: Emergency Medicine | Admitting: Emergency Medicine

## 2020-01-17 ENCOUNTER — Telehealth: Payer: Self-pay

## 2020-01-17 ENCOUNTER — Encounter: Payer: Self-pay | Admitting: Emergency Medicine

## 2020-01-17 DIAGNOSIS — R109 Unspecified abdominal pain: Secondary | ICD-10-CM | POA: Diagnosis not present

## 2020-01-17 DIAGNOSIS — R000 Tachycardia, unspecified: Secondary | ICD-10-CM | POA: Diagnosis not present

## 2020-01-17 DIAGNOSIS — R52 Pain, unspecified: Secondary | ICD-10-CM | POA: Diagnosis not present

## 2020-01-17 DIAGNOSIS — R079 Chest pain, unspecified: Secondary | ICD-10-CM | POA: Diagnosis not present

## 2020-01-17 DIAGNOSIS — I959 Hypotension, unspecified: Secondary | ICD-10-CM | POA: Diagnosis not present

## 2020-01-17 DIAGNOSIS — M549 Dorsalgia, unspecified: Secondary | ICD-10-CM | POA: Diagnosis not present

## 2020-01-17 DIAGNOSIS — E039 Hypothyroidism, unspecified: Secondary | ICD-10-CM | POA: Diagnosis not present

## 2020-01-17 DIAGNOSIS — R0789 Other chest pain: Secondary | ICD-10-CM

## 2020-01-17 DIAGNOSIS — Z79899 Other long term (current) drug therapy: Secondary | ICD-10-CM | POA: Diagnosis not present

## 2020-01-17 DIAGNOSIS — I10 Essential (primary) hypertension: Secondary | ICD-10-CM | POA: Insufficient documentation

## 2020-01-17 LAB — BASIC METABOLIC PANEL
Anion gap: 9 (ref 5–15)
BUN: 11 mg/dL (ref 8–23)
CO2: 27 mmol/L (ref 22–32)
Calcium: 9 mg/dL (ref 8.9–10.3)
Chloride: 101 mmol/L (ref 98–111)
Creatinine, Ser: 0.65 mg/dL (ref 0.44–1.00)
GFR, Estimated: >60 mL/min (ref >60)
Glucose, Bld: 118 mg/dL — ABNORMAL HIGH (ref 70–99)
Potassium: 4.6 mmol/L (ref 3.5–5.1)
Sodium: 137 mmol/L (ref 135–145)

## 2020-01-17 LAB — CBC
HCT: 40.7 % (ref 36.0–46.0)
Hemoglobin: 13.8 g/dL (ref 12.0–15.0)
MCH: 32.5 pg (ref 26.0–34.0)
MCHC: 33.9 g/dL (ref 30.0–36.0)
MCV: 95.8 fL (ref 80.0–100.0)
Platelets: 290 10*3/uL (ref 150–400)
RBC: 4.25 MIL/uL (ref 3.87–5.11)
RDW: 13 % (ref 11.5–15.5)
WBC: 9.7 10*3/uL (ref 4.0–10.5)
nRBC: 0 % (ref 0.0–0.2)

## 2020-01-17 LAB — TROPONIN I (HIGH SENSITIVITY)
Troponin I (High Sensitivity): 6 ng/L (ref <18)
Troponin I (High Sensitivity): 6 ng/L (ref <18)

## 2020-01-17 NOTE — Telephone Encounter (Signed)
Spoke with patients husband per release form. He states that wife is having active chest pain and after calling patients primary care provider they advised to call 911. EMS is currently there with patient and has not begun their assessment. Husband wanted to see if she could come in to see Dr. Mariah Milling today. Reviewed that he is not in the office but covering over at the hospital. Advised that she go to ED for further work up or EMS recommendations. He verbalized understanding of our conversation with no further questions at this time.

## 2020-01-17 NOTE — Telephone Encounter (Signed)
[facility] Day - Client TELEPHONE ADVICE RECORD Access[employer]urse Patient Name: [name] Gender: Female DOB: 11-May-1940 Age: 79 Y 3 M 12 D Return Phone Number: [phone] (Primary), [phone] (Secondary) Address[place] [address] Client [facility] Day - Client Client Site [facility] - Day Physician Raechel Ache - MD Contact Type Call Who Is Calling Patient / Member / Family / Caregiver Call Type Triage / Clinical Caller Name [facility] Relationship To Patient Spouse Return Phone Number [phone] (Primary) Chief Complaint CHEST PAIN (>=21 years) - pain, pressure, heaviness or tightness Reason for Call Symptomatic / Request for Health Information Initial Comment Transferred from answering service, PT is having chest pain and feels like she is choking. Translation No Nurse Assessment Nurse: Su Hilt, RN, Lupita Leash Date/Time Lamount Cohen Time): 01/17/2020 8:16:48 AM Confirm and document reason for call. If symptomatic, describe symptoms. ---Caller states she is having symptoms of chest pain. States the pain started behind the shoulder blades last night and is now and middle of her chest. Does the patient have any new or worsening symptoms? ---Yes Will a triage be completed? ---Yes Related visit to physician within the last 2 weeks? ---No Does the PT have any chronic conditions? (i.e. diabetes, asthma, this includes High risk factors for pregnancy, etc.) ---Yes List chronic conditions. ---HTN Is this a behavioral health or substance abuse call? ---No Guidelines Guideline Title Affirmed Question Affirmed Notes Nurse Date/Time Lamount Cohen Time) Chest Pain Sounds like a lifethreatening emergency to the triager Su Hilt, RN, Lupita Leash 01/17/2020 8:18:24 AM Disp. Time Lamount Cohen Time) Disposition Final User 01/17/2020 8:15:10 AM Send to Urgent Dorian Pod 01/17/2020 8:29:27 AM 911 Outcome  Documentation Su Hilt, RN, Lupita Leash Reason: EMS has been dispatched 01/17/2020 8:26:42 AM Call EMS 911 Now Yes Su Hilt, RN, Lupita Leash PLEASE NOTE: All timestamps contained within this report are represented as [place] Standard Time. CONFIDENTIALTY NOTICE: This fax transmission is intended only for the addressee. It contains information that is legally privileged, confidential or otherwise protected from use or disclosure. If you are not the intended recipient, you are strictly prohibited from reviewing, disclosing, copying using or disseminating any of this information or taking any action in reliance on or regarding this information. If you have received this fax in error, please notify us immediately by telephone so that we can arrange for its return to [place]. Phone: [phone], Toll-Free: [phone], Fax: [phone] Page: 2 of 2 Call Id: [id number] Caller Disagree/Comply Comply Caller Understands Yes PreDisposition Did not know what to do Care Advice Given Per Guideline CALL EMS 911 NOW: * Immediate medical attention is needed. You need to hang up and call 911 (or an ambulance). * Triager Discretion: I'll call you back in a few minutes to be sure you were able to reach them. Comments User: Rocky Link, RN Date/Time Lamount Cohen Time): 01/17/2020 8:25:03 AM Caller states she has multiple other heart issues but she cannot remember the name of her diagnosis. Spoke with husband and he will call 911 for immediate evaluation. User: Rocky Link, RN Date/Time Lamount Cohen Time): 01/17/2020 8:26:37 AM Caller states the pain in her chest comes and goes and she is also having intermittent episodes of sweating and dizziness. Referrals GO TO FACILITY UNDECIDED

## 2020-01-17 NOTE — ED Triage Notes (Addendum)
First RN note: pt to ED via [facility] with c/o L sided back pain that radiates to L flank. Per EMS pt with episode yesterday that radiated up into chest and L shoulder. Per EMS pt with NSR and normal EKG. Per EMS pt c/o back pain at this time.   172/102 78HR 99RA 98.4

## 2020-01-17 NOTE — Telephone Encounter (Signed)
Per chart review tab pt is at [facility] ED. Sending note to Dr Para March who is out of office and Pamala Hurry NP who is in office.

## 2020-01-17 NOTE — Telephone Encounter (Signed)
Pt c/o of Chest Pain: STAT if CP now or developed within 24 hours  1. Are you having CP right now? Not at the moment, EMS is currently at patient's house  2. Are you experiencing any other symptoms (ex. SOB, nausea, vomiting, sweating)?  Feels like she is choking, sharp pain in left shoulder blade, flutters last night   3. How long have you been experiencing CP? Since 1 am this morning.  4. Is your CP continuous or coming and going? continuous  5. Have you taken Nitroglycerin? no ?

## 2020-01-17 NOTE — ED Provider Notes (Signed)
[facility] Emergency Department Provider Note  Time seen: 2:32 PM  I have reviewed the triage vital signs and the nursing notes.   HISTORY  Chief Complaint Chest Pain and Back Pain   HPI [name] is a 79 y.o. female past medical history of hypertension, hyperlipidemia, chronic back pain, presents to the emergency department for left back pain.   According to the patient yesterday she was experiencing pain in the left back that was worse with deep inspiration.  She thought it was musculoskeletal as she had been lifting heavier objects and has a history of back pain.  However he began wearing her.  Today she states the pain was gone but she wanted to be evaluated as a precaution.  Denies any pain currently.  No back pain or anterior chest pain.  No pain with inspiration.  No abdominal pain.  Denies any nausea diaphoresis or shortness of breath at any time.  Past Medical History:  Diagnosis Date  . Back pain   . Endometriosis   . HLD (hyperlipidemia)   . Hypercholesteremia   . Hypothyroidism   . Migraine   . Venous stasis ulcer (HCC) 11/1997   Wound care center, [place]    Patient Active Problem List   Diagnosis Date Noted  . Anxiety 10/18/2019  . Fatty liver 10/17/2019  . Neck pain 10/16/2018  . Knee pain 10/16/2018  . Health care maintenance 10/02/2017  . Advance care planning 10/02/2017  . Hypothyroidism 10/02/2017  . Restless leg syndrome 10/02/2017  . Insomnia 03/13/2017  . 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 Well woman exam 09/15/2016  . GERD (gastroesophageal reflux disease) 09/15/2016  . Vitamin D deficiency 09/10/2015  . Bleeding from varicose vein 08/06/2015  . Spondylosis 09/17/2014  . 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 Low back pain 09/09/2014  . HLD (hyperlipidemia) 04/16/2013  . HTN (hypertension) 05/08/2012  . Post menopausal syndrome 01/27/2012  . Migraine 09/11/2007  . 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 Venous (peripheral) insufficiency 09/08/2007    Past Surgical History:  Procedure Laterality Date  . ABDOMINAL HYSTERECTOMY     1 1/2 ovaries gone, second to endometriosis  . APPENDECTOMY    . CYSTOSCOPY    . HEMORRHOID SURGERY    . HERNIA REPAIR    . ROTATOR CUFF REPAIR    . THYROIDECTOMY      Prior to Admission medications   Medication Sig Start Date End Date Taking? Authorizing Provider  atenolol (TENORMIN) 50 MG tablet Take 0.5-1 tablets (25-50 mg total) by mouth daily. 10/15/19   Joaquim Nam, MD  benzonatate (TESSALON) 200 MG capsule Take 1 capsule (200 mg total) by mouth 3 (three) times daily as needed for cough. Do not bite pill 12/22/17   Tower, Audrie Gallus, MD  butalbital-acetaminophen-caffeine (FIORICET) [phone] MG tablet TAKE ONE TABLET BY MOUTH TWICE A DAY AS NEEDED FOR HEADACHE 10/13/18   Joaquim Nam, MD  Cholecalciferol (VITAMIN D) 50 MCG (2000 UT) CAPS Take 1 capsule (2,000 Units total) by mouth daily. 10/15/19   Joaquim Nam, MD  diazepam (VALIUM) 5 MG tablet Take 0.5-1 tablets (2.5-5 mg total) by mouth every 12 (twelve) hours as needed for anxiety. 10/15/19   Joaquim Nam, MD  fluocinonide-emollient (LIDEX-E) 0.05 % cream Apply 1 application topically 2 (two) times daily. 10/15/19   Joaquim Nam, MD  fluticasone Aleda Grana) 50 MCG/ACT nasal spray Place 2 sprays into both nostrils daily. 10/13/18   Crawford Givens  S, MD  ibuprofen (ADVIL) 800 MG tablet TAKE ONE TABLET BY MOUTH EVERY 8 HOURS AS NEEDED FOR MODERATE PAIN. TAKE WITH FOOD 09/07/19   Joaquim Nam, MD  levothyroxine (SYNTHROID) 100 MCG tablet 1 a day except for 1/2 tab a day on Sundays 10/15/19   Joaquim Nam, MD  Multiple Vitamin (MULTIVITAMIN) tablet Take 1 tablet by mouth daily.      [provider]  omeprazole (PRILOSEC) 20 MG capsule TAKE ONE CAPSULE BY MOUTH DAILY 11/12/19   Joaquim Nam, MD  rOPINIRole (REQUIP) 1 MG tablet Take 1 tablet (1 mg total) by mouth at  bedtime as needed. 10/15/19   Joaquim Nam, MD  simvastatin (ZOCOR) 40 MG tablet Take 1 tablet (40 mg total) by mouth at bedtime. 10/15/19   Joaquim Nam, MD    Allergies  Allergen Reactions  . Codeine     REACTION: NAUSEA AND VOMITING  . Tramadol     Vomiting, LOC    Family History  Problem Relation Age of Onset  . Stroke Mother   . Goiter Mother   . Stomach cancer Sister   . Heart attack Father   . Heart disease Father   . Heart attack Brother   . Kidney disease Brother   . Breast cancer Daughter   . Colon cancer Neg Hx   . Esophageal cancer Neg Hx     Social History Social History   Tobacco Use  . Smoking status: Never Smoker  . Smokeless tobacco: Never Used  Vaping Use  . Vaping Use: Never used  Substance Use Topics  . Alcohol use: No    Alcohol/week: 0.0 standard drinks  . Drug use: No    Review of Systems Constitutional: Negative for fever. Cardiovascular: Left posterior chest pain moderate, yesterday, now resolved Respiratory: Negative for shortness of breath. Gastrointestinal: Negative for abdominal pain, vomiting and diarrhea. Musculoskeletal: Negative for leg pain or swelling Skin: Negative for skin complaints  Neurological: Negative for headache All other ROS negative  ____________________________________________   PHYSICAL EXAM:  VITAL SIGNS: ED Triage Vitals [01/17/20 0953]  Enc Vitals Group     BP (!) 151/102     Pulse Rate 67     Resp 20     Temp 98.7 F (37.1 C)     Temp Source Oral     SpO2 97 %     Weight 190 lb (86.2 kg)     Height 5\' 8"  (1.727 m)     Head Circumference      Peak Flow      Pain Score 5     Pain Loc      Pain Edu?      Excl. in GC?    Constitutional: Alert and oriented. Well appearing and in no distress. Eyes: Normal exam ENT      Head: Normocephalic and atraumatic.      Mouth/Throat: Mucous membranes are moist. Cardiovascular: Normal rate, regular rhythm.  Chest wall is nontender to  palpation. Respiratory: Normal respiratory effort without tachypnea nor retractions. Breath sounds are clear Gastrointestinal: Soft and nontender. No distention.  Musculoskeletal: Nontender with normal range of motion in all extremities. No lower extremity tenderness or edema. Neurologic:  Normal speech and language. No gross focal neurologic deficits are appreciated. Skin:  Skin is warm, dry and intact.  Psychiatric: Mood and affect are normal.   ____________________________________________    EKG  EKG viewed and interpreted by myself shows a normal sinus rhythm at  60 bpm with a narrow QRS, normal axis, normal intervals, no concerning ST changes.  ____________________________________________    RADIOLOGY  Chest x-ray is negative  ____________________________________________   INITIAL IMPRESSION / ASSESSMENT AND PLAN / ED COURSE  Pertinent labs & imaging results that were available during my care of the patient were reviewed by me and considered in my medical decision making (see chart for details).   Patient presents to the emergency department for left posterior chest pain yesterday states it is worse with movement or deep inspiration.  It has since resolved.  Denies any chest pain.  No pleuritic pain.  Patient's work-up today is reassuring with a negative chest x-ray, lab work is within normal limits including negative troponin x2.  EKG is reassuring.  Given the patient's overall reassuring work-up I did discuss with Dr. Rockey Situ who will follow up with the patient as an outpatient for further evaluation and consideration of a stress test if needed.  Provided my typical chest pain return precautions.  Patient agreeable to plan of care.  [name] was evaluated in Emergency Department on 01/17/2020 for the symptoms described in the history of present illness. She was evaluated in the context of the global COVID-19 pandemic, which necessitated consideration that the patient might  be at risk for infection with the SARS-CoV-2 virus that causes COVID-19. Institutional protocols and algorithms that pertain to the evaluation of patients at risk for COVID-19 are in a state of rapid change based on information released by regulatory bodies including the CDC and federal and state organizations. These policies and algorithms were followed during the patient's care in the ED.  ____________________________________________   FINAL CLINICAL IMPRESSION(S) / ED DIAGNOSES  Chest pain Back pain   Harvest Dark, MD 01/17/20 1437

## 2020-01-17 NOTE — Telephone Encounter (Signed)
Agree with ER eval.  Will await ER notes.  Thanks.

## 2020-01-17 NOTE — Telephone Encounter (Signed)
noted 

## 2020-01-17 NOTE — Telephone Encounter (Signed)
Spoke with patients spouse per release form and he states that they did take her to ED for further evaluation. He reports that they are not able to see her in the ED but advised that she is where she needs to be for further work up. No further questions for now and provided reassurance that she is in good hands. He verbalized understanding and had no further questions at this time.   Another number to call for Mr. Odekirk is [phone]

## 2020-01-17 NOTE — ED Triage Notes (Signed)
Pt states CP last night at approx 0100, denies CP now, states "real sharp", worsens with deep breath. Pt states 5/10 back pain. Pt states under a lot of stress at this time. Pt A&O x4.

## 2020-02-11 DIAGNOSIS — H353132 Nonexudative age-related macular degeneration, bilateral, intermediate dry stage: Secondary | ICD-10-CM | POA: Diagnosis not present

## 2020-02-20 DIAGNOSIS — M545 Low back pain, unspecified: Secondary | ICD-10-CM | POA: Diagnosis not present

## 2020-02-27 DIAGNOSIS — M545 Low back pain, unspecified: Secondary | ICD-10-CM | POA: Diagnosis not present

## 2020-03-10 DIAGNOSIS — Z85828 Personal history of other malignant neoplasm of skin: Secondary | ICD-10-CM | POA: Diagnosis not present

## 2020-03-10 DIAGNOSIS — L821 Other seborrheic keratosis: Secondary | ICD-10-CM | POA: Diagnosis not present

## 2020-03-10 DIAGNOSIS — C44519 Basal cell carcinoma of skin of other part of trunk: Secondary | ICD-10-CM | POA: Diagnosis not present

## 2020-03-12 DIAGNOSIS — M47817 Spondylosis without myelopathy or radiculopathy, lumbosacral region: Secondary | ICD-10-CM | POA: Diagnosis not present

## 2020-03-12 DIAGNOSIS — M545 Low back pain, unspecified: Secondary | ICD-10-CM | POA: Diagnosis not present

## 2020-03-23 ENCOUNTER — Encounter: Payer: Self-pay | Admitting: Family Medicine

## 2020-04-01 ENCOUNTER — Other Ambulatory Visit: Payer: Self-pay | Admitting: Family Medicine

## 2020-04-01 DIAGNOSIS — G2581 Restless legs syndrome: Secondary | ICD-10-CM

## 2020-04-01 MED ORDER — ROPINIROLE HCL 1 MG PO TABS: 1.5000 mg | ORAL_TABLET | Freq: Every evening | ORAL | Status: DC | PRN

## 2020-04-21 DIAGNOSIS — M545 Low back pain, unspecified: Secondary | ICD-10-CM | POA: Diagnosis not present

## 2020-04-21 DIAGNOSIS — M47817 Spondylosis without myelopathy or radiculopathy, lumbosacral region: Secondary | ICD-10-CM | POA: Diagnosis not present

## 2020-04-24 ENCOUNTER — Encounter: Payer: Self-pay | Admitting: Family Medicine

## 2020-04-24 ENCOUNTER — Ambulatory Visit (INDEPENDENT_AMBULATORY_CARE_PROVIDER_SITE_OTHER): Payer: Medicare Other | Admitting: Family Medicine

## 2020-04-24 ENCOUNTER — Other Ambulatory Visit: Payer: Self-pay

## 2020-04-24 DIAGNOSIS — H6120 Impacted cerumen, unspecified ear: Secondary | ICD-10-CM

## 2020-04-24 DIAGNOSIS — G2581 Restless legs syndrome: Secondary | ICD-10-CM | POA: Diagnosis not present

## 2020-04-24 LAB — CBC WITH DIFFERENTIAL/PLATELET
Basophils Absolute: 0.1 10*3/uL (ref 0.0–0.1)
Basophils Relative: 1.2 % (ref 0.0–3.0)
Eosinophils Absolute: 0.2 10*3/uL (ref 0.0–0.7)
Eosinophils Relative: 3.2 % (ref 0.0–5.0)
HCT: 40.4 % (ref 36.0–46.0)
Hemoglobin: 13.5 g/dL (ref 12.0–15.0)
Lymphocytes Relative: 22.5 % (ref 12.0–46.0)
Lymphs Abs: 1.6 10*3/uL (ref 0.7–4.0)
MCHC: 33.4 g/dL (ref 30.0–36.0)
MCV: 96.4 fl (ref 78.0–100.0)
Monocytes Absolute: 0.7 10*3/uL (ref 0.1–1.0)
Monocytes Relative: 9.4 % (ref 3.0–12.0)
Neutro Abs: 4.5 10*3/uL (ref 1.4–7.7)
Neutrophils Relative %: 63.7 % (ref 43.0–77.0)
Platelets: 277 10*3/uL (ref 150.0–400.0)
RBC: 4.19 Mil/uL (ref 3.87–5.11)
RDW: 13.1 % (ref 11.5–15.5)
WBC: 7.1 10*3/uL (ref 4.0–10.5)

## 2020-04-24 LAB — BASIC METABOLIC PANEL
BUN: 11 mg/dL (ref 6–23)
CO2: 29 mEq/L (ref 19–32)
Calcium: 9.4 mg/dL (ref 8.4–10.5)
Chloride: 104 mEq/L (ref 96–112)
Creatinine, Ser: 0.8 mg/dL (ref 0.40–1.20)
GFR: 70.01 mL/min (ref >60.00)
Glucose, Bld: 99 mg/dL (ref 70–99)
Potassium: 4.5 mEq/L (ref 3.5–5.1)
Sodium: 141 mEq/L (ref 135–145)

## 2020-04-24 LAB — TSH: TSH: 0.84 u[IU]/mL (ref 0.35–4.50)

## 2020-04-24 LAB — IRON: Iron: 107 ug/dL (ref 42–145)

## 2020-04-24 MED ORDER — ROPINIROLE HCL 1 MG PO TABS
1.5000 mg | ORAL_TABLET | Freq: Every evening | ORAL | 3 refills | Status: DC | PRN
Start: 2020-04-24 — End: 2020-06-25

## 2020-04-24 NOTE — Progress Notes (Signed)
This visit occurred during the SARS-CoV-2 public health emergency.  Safety protocols were in place, including screening questions prior to the visit, additional usage of staff PPE, and extensive cleaning of exam room while observing appropriate contact time as indicated for disinfecting solutions.  RLS sx.  Taking 1mg  requip.  Sx start about 5 PM.  B leg movement.  "something's in there but it can't stay still."  Increase to 2mg  only helped a little.   She had treatment for varicose veins.     She is considering back injection for pain, d/w pt.    L ear feels stuffy, no pain.  No fevers.   Meds, vitals, and allergies reviewed.   ROS: Per HPI unless specifically indicated in ROS section   GEN: nad, alert and oriented HEENT: ncat, B cerumen impaction. Resolved with irrigation and curette B, she clearly felt better. Recheck TM wnl.  NECK: supple w/o LA CV: rrr PULM: ctab, no inc wob ABD: soft, +bs EXT: no edema SKIN: well perfused.

## 2020-04-24 NOTE — Patient Instructions (Addendum)
Go to the lab on the way out.   If you have mychart we'll likely use that to update you.    Take care.  Glad to see you. 

## 2020-04-27 ENCOUNTER — Other Ambulatory Visit: Payer: Self-pay | Admitting: Family Medicine

## 2020-04-27 MED ORDER — IRON (FERROUS SULFATE) 325 (65 FE) MG PO TABS: 325.0000 mg | ORAL_TABLET | ORAL | 1 refills | Status: DC

## 2020-04-27 NOTE — Assessment & Plan Note (Signed)
Discussed options.  Continue Requip for now.  See orders.  Can take 1.5-2 mg at bedtime.  See notes on labs.

## 2020-04-27 NOTE — Assessment & Plan Note (Signed)
Resolved with irrigation and curette.  Recheck canal and TM normal bilaterally.

## 2020-05-01 DIAGNOSIS — Z85828 Personal history of other malignant neoplasm of skin: Secondary | ICD-10-CM | POA: Diagnosis not present

## 2020-05-01 DIAGNOSIS — L718 Other rosacea: Secondary | ICD-10-CM | POA: Diagnosis not present

## 2020-05-14 DIAGNOSIS — M47817 Spondylosis without myelopathy or radiculopathy, lumbosacral region: Secondary | ICD-10-CM | POA: Diagnosis not present

## 2020-06-21 ENCOUNTER — Encounter: Payer: Self-pay | Admitting: Family Medicine

## 2020-06-23 DIAGNOSIS — M545 Low back pain, unspecified: Secondary | ICD-10-CM | POA: Diagnosis not present

## 2020-06-23 DIAGNOSIS — M47817 Spondylosis without myelopathy or radiculopathy, lumbosacral region: Secondary | ICD-10-CM | POA: Diagnosis not present

## 2020-06-25 ENCOUNTER — Other Ambulatory Visit: Payer: Self-pay | Admitting: Family Medicine

## 2020-06-25 MED ORDER — GABAPENTIN 300 MG PO CAPS: 300.0000 mg | ORAL_CAPSULE | Freq: Every day | ORAL | 1 refills | Status: DC

## 2020-06-25 MED ORDER — ROPINIROLE HCL 2 MG PO TABS: 2.0000 mg | ORAL_TABLET | Freq: Every evening | ORAL | 1 refills | Status: DC | PRN

## 2020-07-02 DIAGNOSIS — M47817 Spondylosis without myelopathy or radiculopathy, lumbosacral region: Secondary | ICD-10-CM | POA: Diagnosis not present

## 2020-07-09 ENCOUNTER — Other Ambulatory Visit: Payer: Self-pay | Admitting: Family Medicine

## 2020-07-14 ENCOUNTER — Other Ambulatory Visit: Payer: Self-pay

## 2020-07-14 ENCOUNTER — Telehealth: Payer: Self-pay

## 2020-07-14 ENCOUNTER — Other Ambulatory Visit: Payer: Self-pay | Admitting: Family Medicine

## 2020-07-14 ENCOUNTER — Telehealth (INDEPENDENT_AMBULATORY_CARE_PROVIDER_SITE_OTHER): Payer: Medicare Other | Admitting: Family Medicine

## 2020-07-14 ENCOUNTER — Encounter: Payer: Self-pay | Admitting: Family Medicine

## 2020-07-14 DIAGNOSIS — U071 COVID-19: Secondary | ICD-10-CM

## 2020-07-14 DIAGNOSIS — G2581 Restless legs syndrome: Secondary | ICD-10-CM | POA: Diagnosis not present

## 2020-07-14 DIAGNOSIS — J069 Acute upper respiratory infection, unspecified: Secondary | ICD-10-CM

## 2020-07-14 DIAGNOSIS — G43909 Migraine, unspecified, not intractable, without status migrainosus: Secondary | ICD-10-CM | POA: Diagnosis not present

## 2020-07-14 MED ORDER — MOLNUPIRAVIR EUA 200MG CAPSULE: 4.0000 | ORAL_CAPSULE | Freq: Two times a day (BID) | ORAL | 0 refills | Status: AC

## 2020-07-14 MED ORDER — BENZONATATE 200 MG PO CAPS
200.0000 mg | ORAL_CAPSULE | Freq: Three times a day (TID) | ORAL | 1 refills | Status: DC | PRN
Start: 2020-07-14 — End: 2020-07-31

## 2020-07-14 NOTE — Telephone Encounter (Signed)
Pt's husband left VM on coumadin clinic line reporting he and his wife are positive for covid.   Contacted pt who reports he and his wife just returned from vacation yesterday and were not feeling well and tested this morning and both are positive. Pt c/o productive cough w/yellow phlegm, HA and scratchy throat. Denies any other symptoms. Pt reports same symptoms for his wife. He is requesting covid medication if PCP feels they need it. Advised VV are usually done before med is prescribed but a msg will be sent to PCP and this office will f/u. Advised if any changes in symptoms or worsening to contact office. Advised of ER precautions. Pt verbalized understanding.

## 2020-07-14 NOTE — Telephone Encounter (Signed)
Refill request for butalbital-acetaminophen-caffeine (FIORICET) 50-325-40 MG tablet  LOV - 04/24/20 Next OV - 07/14/20 Last refill - 10/13/18 #60/0

## 2020-07-14 NOTE — Progress Notes (Signed)
Virtual visit completed through WebEx or similar program Patient location: home  Provider location: [place] [facility], office  Participants: Patient and me (unless stated otherwise below)  Pandemic considerations d/w pt.   Limitations and rationale for visit method d/w patient.  Patient agreed to proceed.   CC: covid   HPI: she needed refill on fioricet, rx done.  D/w pt.    RLS worse in the meantime.  Still on requip at baseline. D/w pt about inc to 2.5mg  at night while sx are worse for covid.  This would hopefully be a temporary increase.    Covid sx started yesterday, tested positive today.  HA, cough, chest congestion.  Taste is still normal.  Can still take a deep breath.  Hoarse voice.  No fevers.  Aching.    Meds and allergies reviewed.   ROS: Per HPI unless specifically indicated in ROS section   NAD Speech wnl  A/P: COVID.  Discussed with patient about options.  Can use Tessalon for cough.  Supportive care.  Routine cautions given to patient.  If short of breath then seek evaluation.  Discussed options for COVID-specific treatment.  Discussed emergency authorization and options.  Reasonable to start [place].  Routine instructions given to patient she can update [place] as needed.  Refill done on Fioricet.  She can increase her Requip to 2.5 mg at night to see if that helps with RLS symptoms.  This will would be a temporary increase.

## 2020-07-14 NOTE — Telephone Encounter (Signed)
Please see about getting patient on the schedule for VV.  Thanks.

## 2020-07-14 NOTE — Telephone Encounter (Signed)
Pt's husband has been advised of apt for himself and his wife for today and apts were added to the schedule.

## 2020-07-14 NOTE — Telephone Encounter (Signed)
Sent. Thanks.   

## 2020-07-16 DIAGNOSIS — U071 COVID-19: Secondary | ICD-10-CM | POA: Insufficient documentation

## 2020-07-16 NOTE — Assessment & Plan Note (Signed)
   She can increase her Requip to 2.5 mg at night to see if that helps with RLS symptoms.  This will would be a temporary increase.

## 2020-07-16 NOTE — Assessment & Plan Note (Signed)
Refill done on Fioricet.

## 2020-07-16 NOTE — Assessment & Plan Note (Signed)
COVID.  Discussed with patient about options.  Can use Tessalon for cough.  Supportive care.  Routine cautions given to patient.  If short of breath then seek evaluation.  Discussed options for COVID-specific treatment.  Discussed emergency authorization and options.  Reasonable to start [place].  Routine instructions given to patient she can update [place] as needed.  She agrees with plan.

## 2020-07-28 ENCOUNTER — Other Ambulatory Visit: Payer: Self-pay | Admitting: Family Medicine

## 2020-07-29 DIAGNOSIS — Z1231 Encounter for screening mammogram for malignant neoplasm of breast: Secondary | ICD-10-CM | POA: Diagnosis not present

## 2020-07-29 LAB — HM MAMMOGRAPHY

## 2020-07-31 ENCOUNTER — Encounter: Payer: Self-pay | Admitting: Family Medicine

## 2020-07-31 ENCOUNTER — Ambulatory Visit (INDEPENDENT_AMBULATORY_CARE_PROVIDER_SITE_OTHER): Payer: Medicare Other | Admitting: Family Medicine

## 2020-07-31 DIAGNOSIS — R059 Cough, unspecified: Secondary | ICD-10-CM

## 2020-07-31 MED ORDER — DOXYCYCLINE HYCLATE 100 MG PO TABS: 100.0000 mg | ORAL_TABLET | Freq: Two times a day (BID) | ORAL | 0 refills | Status: DC

## 2020-07-31 MED ORDER — GABAPENTIN 300 MG PO CAPS: 300.0000 mg | ORAL_CAPSULE | Freq: Two times a day (BID) | ORAL | Status: DC

## 2020-07-31 MED ORDER — ALBUTEROL SULFATE HFA 108 (90 BASE) MCG/ACT IN AERS: 1.0000 | INHALATION_SPRAY | Freq: Four times a day (QID) | RESPIRATORY_TRACT | 1 refills | Status: DC | PRN

## 2020-07-31 NOTE — Progress Notes (Signed)
This visit occurred during the SARS-CoV-2 public health emergency.  Safety protocols were in place, including screening questions prior to the visit, additional usage of staff PPE, and extensive cleaning of exam room while observing appropriate contact time as indicated for disinfecting solutions.  >10 days from covid.  Cough.  Green sputum.  RLS worse since covid.  Gabapentin helped.  Still on requip at baseline.  D/w pt about trying gabapentin BID for RLS sx.  No fevers.  Cough is worse at night, not just laying down but worse at night in general.  Green sputum.  Some wheeze.    Meds, vitals, and allergies reviewed.   ROS: Per HPI unless specifically indicated in ROS section   GEN: nad, alert and oriented HEENT: ncat NECK: supple w/o LA CV: rrr.   PULM: ctab except for scant wheeze. no inc wob ABD: soft, +bs EXT: no edema SKIN: Well-perfused.

## 2020-07-31 NOTE — Patient Instructions (Signed)
Use the inhaler for the cough.  Start doxycycline.  Update me as needed.  Rest and fluids.  Take care.  Glad to see you.

## 2020-08-03 NOTE — Assessment & Plan Note (Signed)
Presumed combination of bronchitis and postinfectious cough after having COVID.  Still okay for outpatient follow-up.  Use albuterol as needed with routine cautions discussed with patient.  She understood.  Routine instructions given to patient.  Start doxycycline routine cautions.  Rest and fluids.  Update [place] as needed.  She agrees with plan.

## 2020-08-10 ENCOUNTER — Other Ambulatory Visit: Payer: Self-pay | Admitting: Family Medicine

## 2020-08-11 DIAGNOSIS — M47817 Spondylosis without myelopathy or radiculopathy, lumbosacral region: Secondary | ICD-10-CM | POA: Diagnosis not present

## 2020-08-11 DIAGNOSIS — M545 Low back pain, unspecified: Secondary | ICD-10-CM | POA: Diagnosis not present

## 2020-08-11 NOTE — Telephone Encounter (Signed)
Refill request for ferrous sulfate 325 mg tabs  LOV - 07/31/20 Next OV - not scheduled Last refill - 04/27/20 #30/1

## 2020-08-12 NOTE — Telephone Encounter (Signed)
Sent. Thanks.   

## 2020-08-22 ENCOUNTER — Other Ambulatory Visit: Payer: Self-pay

## 2020-08-22 ENCOUNTER — Encounter: Payer: Medicare Other | Attending: Physician Assistant | Admitting: Physician Assistant

## 2020-08-22 DIAGNOSIS — I872 Venous insufficiency (chronic) (peripheral): Secondary | ICD-10-CM | POA: Diagnosis not present

## 2020-08-22 DIAGNOSIS — L97322 Non-pressure chronic ulcer of left ankle with fat layer exposed: Secondary | ICD-10-CM | POA: Insufficient documentation

## 2020-08-22 DIAGNOSIS — L97329 Non-pressure chronic ulcer of left ankle with unspecified severity: Secondary | ICD-10-CM | POA: Diagnosis present

## 2020-08-22 NOTE — Progress Notes (Addendum)
NICKOL, ROSEMAN ([id number]) Visit Report for 08/22/2020 Abuse/Suicide Risk Screen Details Patient Name: Kim, DAGGETT T. Date of Service: 08/22/2020 8:45 AM Medical Record Number: [id number] Patient Account Number: [id number] Date of Birth/Sex: Apr 13, 1940 (79 y.o. F) Treating RN: Carlene Coria Primary Care Deryn Massengale: Elsie Stain Other Clinician: Referring Todd Jelinski: Referral, Self Treating Edge Mauger/Extender: Skipper Cliche in Treatment: 0 Abuse/Suicide Risk Screen Items Answer ABUSE RISK SCREEN: Has anyone close to you tried to hurt or harm you recentlyo No Do you feel uncomfortable with anyone in your familyo No Has anyone forced you do things that you didnot want to doo No Electronic Signature(s) Signed: 08/22/2020 11:52:50 AM By: Carlene Coria RN Entered By: Carlene Coria on 08/22/2020 09:09:23 [name] T. ([id number]) -------------------------------------------------------------------------------- Activities of Daily Living Details Patient Name: [name] T. Date of Service: 08/22/2020 8:45 AM Medical Record Number: [id number] Patient Account Number: [id number] Date of Birth/Sex: 16-Nov-1940 (79 y.o. F) Treating RN: Carlene Coria Primary Care Payal Stanforth: Elsie Stain Other Clinician: Referring Xavior Niazi: Referral, Self Treating Hetty Linhart/Extender: Skipper Cliche in Treatment: 0 Activities of Daily Living Items Answer Activities of Daily Living (Please select one for each item) Drive Automobile Completely Able Take Medications Completely Able Use Telephone Completely Able Care for Appearance Completely Able Use Toilet Completely Able Bath / Shower Completely Able Dress Self Completely Able Feed Self Completely Able Walk Completely Able Get In / Out Bed Completely Able Housework Completely Able Prepare Meals Completely Able Handle Money Completely Able Shop for Self Completely Able Electronic Signature(s) Signed: 08/22/2020 11:52:50 AM By: Carlene Coria  RN Entered By: Carlene Coria on 08/22/2020 09:09:50 [name] ([id number]) -------------------------------------------------------------------------------- Education Screening Details Patient Name: Kim [name] T. Date of Service: 08/22/2020 8:45 AM Medical Record Number: [id number] Patient Account Number: [id number] Date of Birth/Sex: 06/13/1940 (79 y.o. F) Treating RN: Carlene Coria Primary Care Khyra Viscuso: Elsie Stain Other Clinician: Referring Iline Buchinger: Referral, Self Treating Jazmeen Axtell/Extender: Skipper Cliche in Treatment: 0 Primary Learner Assessed: Patient Learning Preferences/Education Level/Primary Language Learning Preference: Explanation Highest Education Level: College or Above Preferred Language: English Cognitive Barrier Language Barrier: No Translator Needed: No Memory Deficit: No Emotional Barrier: No Cultural/Religious Beliefs Affecting Medical Care: No Physical Barrier Impaired Vision: Yes Glasses Impaired Hearing: No Decreased Hand dexterity: No Knowledge/Comprehension Knowledge Level: Medium Comprehension Level: High Ability to understand written instructions: High Ability to understand verbal instructions: High Motivation Anxiety Level: Anxious Cooperation: Cooperative Education Importance: Acknowledges Need Interest in Health Problems: Asks Questions Perception: Coherent Willingness to Engage in Self-Management High Activities: Readiness to Engage in Self-Management High Activities: Electronic Signature(s) Signed: 08/22/2020 11:52:50 AM By: Carlene Coria RN Entered By: Carlene Coria on 08/22/2020 09:10:25 [name] ([id number]) -------------------------------------------------------------------------------- Fall Risk Assessment Details Patient Name: [name] T. Date of Service: 08/22/2020 8:45 AM Medical Record Number: [id number] Patient Account Number: [id number] Date of Birth/Sex: [birth year] (79 y.o. F) Treating RN: Carlene Coria Primary Care Draper Gallon: Elsie Stain Other Clinician: Referring Jalana Moore: Referral, Self Treating Ronisha Herringshaw/Extender: Skipper Cliche in Treatment: 0 Fall Risk Assessment Items Have you had 2 or more falls in the last 12 monthso 0 No Have you had any fall that resulted in injury in the last 12 monthso 0 No FALLS RISK SCREEN History of falling - immediate or within 3 months 0 No Secondary diagnosis (Do you have 2 or more medical diagnoseso) 15 Yes Ambulatory aid None/bed rest/wheelchair/nurse 0 Yes Crutches/cane/walker 0 No Furniture 0 No Intravenous therapy Access/Saline/Heparin Lock 0 No Gait/Transferring Normal/ bed rest/ wheelchair 0 Yes Weak (short steps  with or without shuffle, stooped but able to lift head while walking, may 0 No seek support from furniture) Impaired (short steps with shuffle, may have difficulty arising from chair, head down, impaired 0 No balance) Mental Status Oriented to own ability 0 Yes Electronic Signature(s) Signed: 08/22/2020 3:31:39 PM By: Carlene Coria RN Previous Signature: 08/22/2020 3:30:48 PM Version By: Carlene Coria RN Previous Signature: 08/22/2020 11:52:50 AM Version By: Carlene Coria RN Entered By: Carlene Coria on 08/22/2020 15:31:39 [name] T. ([id number]) -------------------------------------------------------------------------------- Foot Assessment Details Patient Name: [name] T. Date of Service: 08/22/2020 8:45 AM Medical Record Number: [id number] Patient Account Number: [id number] Date of Birth/Sex: 1940/05/22 (79 y.o. F) Treating RN: Carlene Coria Primary Care Alysa Duca: Elsie Stain Other Clinician: Referring Nedra Mcinnis: Referral, Self Treating Raechal Raben/Extender: Skipper Cliche in Treatment: 0 Foot Assessment Items Site Locations + = Sensation present, - = Sensation absent, C = Callus, U = Ulcer R = Redness, W = Warmth, M = Maceration, PU = Pre-ulcerative lesion F = Fissure, S = Swelling, D =  Dryness Assessment Right: Left: Other Deformity: No No Prior Foot Ulcer: No No Prior Amputation: No No Charcot Joint: No No Ambulatory Status: Ambulatory Without Help Gait: Steady Electronic Signature(s) Signed: 08/22/2020 11:52:50 AM By: Carlene Coria RN Entered By: Carlene Coria on 08/22/2020 09:12:48 [name] T. ([id number]) -------------------------------------------------------------------------------- Nutrition Risk Screening Details Patient Name: [name] T. Date of Service: 08/22/2020 8:45 AM Medical Record Number: [id number] Patient Account Number: [id number] Date of Birth/Sex: Jun 25, 1940 (79 y.o. F) Treating RN: Carlene Coria Primary Care Alexy Heldt: Elsie Stain Other Clinician: Referring Kally Cadden: Referral, Self Treating Abijah Roussel/Extender: Skipper Cliche in Treatment: 0 Height (in): 68 Weight (lbs): 192 Body Mass Index (BMI): 29.2 Nutrition Risk Screening Items Score Screening NUTRITION RISK SCREEN: I have an illness or condition that made me change the kind and/or amount of food I eat 0 No I eat fewer than two meals per day 0 No I eat few fruits and vegetables, or milk products 0 No I have three or more drinks of beer, liquor or wine almost every day 0 No I have tooth or mouth problems that make it hard for me to eat 0 No I don't always have enough money to buy the food I need 0 No I eat alone most of the time 0 No I take three or more different prescribed or over-the-counter drugs a day 1 Yes Without wanting to, I have lost or gained 10 pounds in the last six months 0 No I am not always physically able to shop, cook and/or feed myself 0 No Nutrition Protocols Good Risk Protocol Moderate Risk Protocol High Risk Proctocol Risk Level: Good Risk Score: 1 Electronic Signature(s) Signed: 08/22/2020 11:52:50 AM By: Carlene Coria RN Entered By: Carlene Coria on 08/22/2020 09:10:51

## 2020-08-22 NOTE — Progress Notes (Addendum)
[name], [name] ([id number]) Visit Report for 08/22/2020 Allergy List Details Patient Name: [name], [name]. Date of Service: 08/22/2020 8:45 AM Medical Record Number: [id number] Patient Account Number: [id number] Date of Birth/Sex: 1940-10-26 (79 y.o. F) Treating RN: Carlene Coria Primary Care Andriana Casa: Elsie Stain Other Clinician: Referring Andriana Casa: Referral, Self Treating Lenyx Boody/Extender: Jeri Cos Weeks in Treatment: 0 Allergies Active Allergies codeine Reaction: vomiting Allergy Notes Electronic Signature(s) Signed: 08/22/2020 11:52:50 AM By: Carlene Coria RN Entered By: Carlene Coria on 08/22/2020 09:08:28 [name], [name] ([id number]) -------------------------------------------------------------------------------- Arrival Information Details Patient Name: [name], Kampbell [name]. Date of Service: 08/22/2020 8:45 AM Medical Record Number: [id number] Patient Account Number: [id number] Date of Birth/Sex: 1940/07/30 (79 y.o. F) Treating RN: Carlene Coria Primary Care Offie Waide: Elsie Stain Other Clinician: Referring Taheera Thomann: Referral, Self Treating Romeo Zielinski/Extender: Skipper Cliche in Treatment: 0 Visit Information Patient Arrived: Ambulatory Arrival Time: 09:00 Accompanied By: husband Transfer Assistance: None Patient Identification Verified: Yes Secondary Verification Process Completed: Yes Patient Requires Transmission-Based Precautions: No Patient Has Alerts: No History Since Last Visit All ordered tests and consults were completed: No Added or deleted any medications: No Any new allergies or adverse reactions: No Had a fall or experienced change in activities of daily living that may affect risk of falls: No Signs or symptoms of abuse/neglect since last visito No Hospitalized since last visit: No Implantable device outside of the clinic excluding cellular tissue based products placed in the center since last visit: No Has Dressing in Place as Prescribed:  Yes Electronic Signature(s) Signed: 08/22/2020 11:52:50 AM By: Carlene Coria RN Entered By: Carlene Coria on 08/22/2020 09:06:02 [name], [name] ([id number]) -------------------------------------------------------------------------------- Clinic Level of Care Assessment Details Patient Name: [name], Adamary [name]. Date of Service: 08/22/2020 8:45 AM Medical Record Number: [id number] Patient Account Number: [id number] Date of Birth/Sex: 02-02-1940 (79 y.o. F) Treating RN: Carlene Coria Primary Care Yui Mulvaney: Elsie Stain Other Clinician: Referring Jaree Trinka: Referral, Self Treating Eldean Klatt/Extender: Skipper Cliche in Treatment: 0 Clinic Level of Care Assessment Items TOOL 1 Quantity Score X - Use when EandM and Procedure is performed on INITIAL visit 1 0 ASSESSMENTS - Nursing Assessment / Reassessment X - General Physical Exam (combine w/ comprehensive assessment (listed just below) when performed on new 1 20 pt. evals) X- 1 25 Comprehensive Assessment (HX, ROS, Risk Assessments, Wounds Hx, etc.) ASSESSMENTS - Wound and Skin Assessment / Reassessment '[]'$  - Dermatologic / Skin Assessment (not related to wound area) 0 ASSESSMENTS - Ostomy and/or Continence Assessment and Care '[]'$  - Incontinence Assessment and Management 0 '[]'$  - 0 Ostomy Care Assessment and Management (repouching, etc.) PROCESS - Coordination of Care X - Simple Patient / Family Education for ongoing care 1 15 '[]'$  - 0 Complex (extensive) Patient / Family Education for ongoing care '[]'$  - 0 Staff obtains Programmer, systems, Records, Test Results / Process Orders '[]'$  - 0 Staff telephones HHA, Nursing Homes / Clarify orders / etc '[]'$  - 0 Routine Transfer to another Facility (non-emergent condition) '[]'$  - 0 Routine Hospital Admission (non-emergent condition) X- 1 15 New Admissions / Biomedical engineer / Ordering NPWT, Apligraf, etc. '[]'$  - 0 Emergency Hospital Admission (emergent condition) PROCESS - Special Needs '[]'$  - Pediatric /  Minor Patient Management 0 '[]'$  - 0 Isolation Patient Management '[]'$  - 0 Hearing / Language / Visual special needs '[]'$  - 0 Assessment of Community assistance (transportation, D/C planning, etc.) '[]'$  - 0 Additional assistance / Altered mentation '[]'$  - 0 Support Surface(s) Assessment (bed, cushion, seat, etc.) INTERVENTIONS - Miscellaneous '[]'$  - External ear  exam 0 '[]'$  - 0 Patient Transfer (multiple staff / Civil Service fast streamer / Similar devices) '[]'$  - 0 Simple Staple / Suture removal (25 or less) '[]'$  - 0 Complex Staple / Suture removal (26 or more) '[]'$  - 0 Hypo/Hyperglycemic Management (do not check if billed separately) X- 1 15 Ankle / Brachial Index (ABI) - do not check if billed separately Has the patient been seen at the hospital within the last three years: Yes Total Score: 90 Level Of Care: New/Established - Level 3 [name], [name]. ([id number]) Electronic Signature(s) Signed: 08/22/2020 11:52:50 AM By: Carlene Coria RN Entered By: Carlene Coria on 08/22/2020 09:35:41 [name], [name]. ([id number]) -------------------------------------------------------------------------------- Compression Therapy Details Patient Name: [name], Jamayah [name]. Date of Service: 08/22/2020 8:45 AM Medical Record Number: [id number] Patient Account Number: [id number] Date of Birth/Sex: 1940-08-10 (79 y.o. F) Treating RN: Carlene Coria Primary Care Rasheen Schewe: Elsie Stain Other Clinician: Referring Mala Gibbard: Referral, Self Treating Krishiv Sandler/Extender: Skipper Cliche in Treatment: 0 Compression Therapy Performed for Wound Assessment: Wound #3 Left,Medial Ankle Performed By: Clinician Carlene Coria, RN Compression Type: Three Layer Post Procedure Diagnosis Same as Pre-procedure Electronic Signature(s) Signed: 08/22/2020 11:52:50 AM By: Carlene Coria RN Entered By: Carlene Coria on 08/22/2020 09:35:57 [name], Kim TMarland Kitchen  ([id number]) -------------------------------------------------------------------------------- Encounter Discharge Information Details Patient Name: [name], Addalie [name]. Date of Service: 08/22/2020 8:45 AM Medical Record Number: [id number] Patient Account Number: [id number] Date of Birth/Sex: October 19, 1940 (79 y.o. F) Treating RN: Carlene Coria Primary Care Naija Troost: Elsie Stain Other Clinician: Referring Glenys Snader: Referral, Self Treating Kara Melching/Extender: Skipper Cliche in Treatment: 0 Encounter Discharge Information Items Discharge Condition: Stable Ambulatory Status: Ambulatory Discharge Destination: Home Transportation: Private Auto Accompanied By: husband Schedule Follow-up Appointment: Yes Clinical Summary of Care: Patient Declined Electronic Signature(s) Signed: 08/22/2020 11:52:50 AM By: Carlene Coria RN Entered By: Carlene Coria on 08/22/2020 09:38:05 [name], [name]. ([id number]) -------------------------------------------------------------------------------- Lower Extremity Assessment Details Patient Name: [name], [name]. Date of Service: 08/22/2020 8:45 AM Medical Record Number: [id number] Patient Account Number: [id number] Date of Birth/Sex: 1941/01/09 (79 y.o. F) Treating RN: Carlene Coria Primary Care Corin Tilly: Elsie Stain Other Clinician: Referring Jaileigh Weimer: Referral, Self Treating Edna Rede/Extender: Skipper Cliche in Treatment: 0 Edema Assessment Assessed: [Left: No] [Right: No] [Left: Edema] [Right: :] Calf Left: Right: Point of Measurement: 35 cm From Medial Instep 38 cm Ankle Left: Right: Point of Measurement: 10 cm From Medial Instep 22.4 cm Knee To Floor Left: Right: From Medial Instep 48 cm Vascular Assessment Pulses: Dorsalis Pedis Palpable: [Left:Yes] Doppler Audible: [Left:Yes] Blood Pressure: Brachial: [Left:144] Ankle: [Left:Dorsalis Pedis: 160 1.11] Electronic Signature(s) Signed: 08/22/2020 11:52:50 AM By: Carlene Coria RN Entered By:  Carlene Coria on 08/22/2020 09:17:41 [name], [name]. ([id number]) -------------------------------------------------------------------------------- Multi Wound Chart Details Patient Name: [name], Jordanne [name]. Date of Service: 08/22/2020 8:45 AM Medical Record Number: [id number] Patient Account Number: [id number] Date of Birth/Sex: 02-22-1940 (79 y.o. F) Treating RN: Carlene Coria Primary Care Anuoluwapo Mefferd: Elsie Stain Other Clinician: Referring Maysen Bonsignore: Referral, Self Treating Nicholi Ghuman/Extender: Skipper Cliche in Treatment: 0 Vital Signs Height(in): 68 Pulse(bpm): 70 Weight(lbs): 192 Blood Pressure(mmHg): 144/82 Body Mass Index(BMI): 29 Temperature(F): 97.9 Respiratory Rate(breaths/min): 20 Photos: [N/A:N/A] Wound Location: Left, Medial Ankle N/A N/A Wounding Event: Gradually Appeared N/A N/A Primary Etiology: Venous Leg Ulcer N/A N/A Comorbid History: Hypertension N/A N/A Date Acquired: 08/13/2020 N/A N/A Weeks of Treatment: 0 N/A N/A Wound Status: Open N/A N/A Measurements L x W x D (cm) 0.4x0.2x0.2 N/A N/A Area (cm) : 0.063 N/A N/A Volume (cm) : 0.013 N/A N/A Classification: Full  Thickness Without Exposed N/A N/A Support Structures Exudate Amount: Medium N/A N/A Exudate Type: Serosanguineous N/A N/A Exudate Color: red, brown N/A N/A Granulation Amount: Small (1-33%) N/A N/A Granulation Quality: Pink N/A N/A Necrotic Amount: Large (67-100%) N/A N/A Exposed Structures: Fat Layer (Subcutaneous Tissue): N/A N/A Yes Epithelialization: None N/A N/A Treatment Notes Electronic Signature(s) Signed: 08/22/2020 11:52:50 AM By: Carlene Coria RN Entered By: Carlene Coria on 08/22/2020 09:30:23 [name], [name] ([id number]) -------------------------------------------------------------------------------- [place] Details Patient Name: [name]. Date of Service: 08/22/2020 8:45 AM Medical Record Number: [id number] Patient Account Number: [id number] Date  of Birth/Sex: 1940-07-31 (79 y.o. F) Treating RN: Carlene Coria Primary Care Mickie Badders: Elsie Stain Other Clinician: Referring Jajaira Ruis: Referral, Self Treating Namiyah Grantham/Extender: Skipper Cliche in Treatment: 0 Active Inactive Abuse / Safety / Falls / Self Care Management Nursing Diagnoses: Potential for falls Goals: Patient will remain injury free related to falls Date Initiated: 08/22/2020 Target Resolution Date: 09/22/2020 Goal Status: Active Interventions: Assess Activities of Daily Living upon admission and as needed Assess fall risk on admission and as needed Assess: immobility, friction, shearing, incontinence upon admission and as needed Assess impairment of mobility on admission and as needed per policy Assess personal safety and home safety (as indicated) on admission and as needed Assess self care needs on admission and as needed Notes: Wound/Skin Impairment Nursing Diagnoses: Knowledge deficit related to ulceration/compromised skin integrity Goals: Patient/caregiver will verbalize understanding of skin care regimen Date Initiated: 08/22/2020 Target Resolution Date: 09/22/2020 Goal Status: Active Ulcer/skin breakdown will have a volume reduction of 30% by week 4 Date Initiated: 08/22/2020 Target Resolution Date: 09/22/2020 Goal Status: Active Ulcer/skin breakdown will have a volume reduction of 50% by week 8 Date Initiated: 08/22/2020 Target Resolution Date: 10/22/2020 Goal Status: Active Ulcer/skin breakdown will have a volume reduction of 80% by week 12 Date Initiated: 08/22/2020 Target Resolution Date: 11/22/2020 Goal Status: Active Ulcer/skin breakdown will heal within 14 weeks Date Initiated: 08/22/2020 Target Resolution Date: 12/22/2020 Goal Status: Active Interventions: Assess patient/caregiver ability to obtain necessary supplies Assess patient/caregiver ability to perform ulcer/skin care regimen upon admission and as needed Assess ulceration(s) every  visit Notes: Electronic Signature(s) Signed: 08/22/2020 3:32:40 PM By: Carlene Coria RN Previous Signature: 08/22/2020 11:52:50 AM Version By: Carlene Coria RN Beidler, Orean [name]. ([id number]) Entered By: Carlene Coria on 08/22/2020 15:32:39 Bracknell, Zakariah [name]. ([id number]) -------------------------------------------------------------------------------- Pain Assessment Details Patient Name: [name], Manami [name]. Date of Service: 08/22/2020 8:45 AM Medical Record Number: [id number] Patient Account Number: [id number] Date of Birth/Sex: [birth year] (79 y.o. F) Treating RN: Carlene Coria Primary Care Canda Podgorski: Elsie Stain Other Clinician: Referring Thomasine Klutts: Referral, Self Treating Tyreanna Bisesi/Extender: Skipper Cliche in Treatment: 0 Active Problems Location of Pain Severity and Description of Pain Patient Has Paino No Site Locations Pain Management and Medication Current Pain Management: Electronic Signature(s) Signed: 08/22/2020 11:52:50 AM By: Carlene Coria RN Entered By: Carlene Coria on 08/22/2020 09:06:10 Coble, [name] ([id number]) -------------------------------------------------------------------------------- Patient/Caregiver Education Details Patient Name: [name], Charise [name]. Date of Service: 08/22/2020 8:45 AM Medical Record Number: [id number] Patient Account Number: [id number] Date of Birth/Gender: Apr 13, 1940 (80 y.o. F) Treating RN: Carlene Coria Primary Care Physician: Elsie Stain Other Clinician: Referring Physician: Referral, Self Treating Physician/Extender: Skipper Cliche in Treatment: 0 Education Assessment Education Provided To: Patient Education Topics Provided Wound/Skin Impairment: Methods: Explain/Verbal Responses: State content correctly Electronic Signature(s) Signed: 08/22/2020 11:52:50 AM By: Carlene Coria RN Entered By: Carlene Coria on 08/22/2020 09:36:51 Kneece, Areanna [name].  ([id number]) -------------------------------------------------------------------------------- Wound Assessment Details Patient Name: [name],  Chrys [name]. Date of Service: 08/22/2020 8:45 AM Medical Record Number: [id number] Patient Account Number: [id number] Date of Birth/Sex: [birth year] (79 y.o. F) Treating RN: Carlene Coria Primary Care Ethelmae Ringel: Elsie Stain Other Clinician: Referring Jamare Vanatta: Referral, Self Treating Sheronica Corey/Extender: Skipper Cliche in Treatment: 0 Wound Status Wound Number: 3 Primary Etiology: Venous Leg Ulcer Wound Location: Left, Medial Ankle Wound Status: Open Wounding Event: Gradually Appeared Comorbid History: Hypertension Date Acquired: 08/13/2020 Weeks Of Treatment: 0 Clustered Wound: No Photos Wound Measurements Length: (cm) 0.4 Width: (cm) 0.2 Depth: (cm) 0.2 Area: (cm) 0.063 Volume: (cm) 0.013 % Reduction in Area: % Reduction in Volume: Epithelialization: None Tunneling: No Undermining: No Wound Description Classification: Full Thickness Without Exposed Support Structures Exudate Amount: Medium Exudate Type: Serosanguineous Exudate Color: red, brown Foul Odor After Cleansing: No Slough/Fibrino Yes Wound Bed Granulation Amount: Small (1-33%) Exposed Structure Granulation Quality: Pink Fat Layer (Subcutaneous Tissue) Exposed: Yes Necrotic Amount: Large (67-100%) Necrotic Quality: Adherent Slough Treatment Notes Wound #3 (Ankle) Wound Laterality: Left, Medial Cleanser Normal Saline Discharge Instruction: Wash your hands with soap and water. Remove old dressing, discard into plastic bag and place into trash. Cleanse the wound with Normal Saline prior to applying a clean dressing using gauze sponges, not tissues or cotton balls. Do not scrub or use excessive force. Pat dry using gauze sponges, not tissue or cotton balls. Soap and Water Discharge Instruction: Gently cleanse wound with antibacterial soap, rinse and pat dry prior to  dressing wounds Peri-Wound Care Dejonge, Yoona [name]. ([id number]) Topical Primary Dressing Silvercel 4 1/4x 4 1/4 (in/in) Discharge Instruction: Apply Silvercel 4 1/4x 4 1/4 (in/in) as instructed Secondary Dressing Gauze Discharge Instruction: As directed: dry, moistened with saline or moistened with Dakins Solution Secured With Compression Wrap Profore Lite LF 3 Multilayer Compression [place] Discharge Instruction: Apply 3 multi-layer wrap as prescribed. Compression Stockings Add-Ons Electronic Signature(s) Signed: 08/22/2020 11:52:50 AM By: Carlene Coria RN Entered By: Carlene Coria on 08/22/2020 09:22:09 [place], [name] ([id number]) -------------------------------------------------------------------------------- Vitals Details Patient Name: [name], See [name]. Date of Service: 08/22/2020 8:45 AM Medical Record Number: [id number] Patient Account Number: [id number] Date of Birth/Sex: 12-Aug-1940 (79 y.o. F) Treating RN: Carlene Coria Primary Care Jadasia Haws: Elsie Stain Other Clinician: Referring Curran Lenderman: Referral, Self Treating Micheala Morissette/Extender: Skipper Cliche in Treatment: 0 Vital Signs Time Taken: 09:06 Temperature (F): 97.9 Height (in): 68 Pulse (bpm): 70 Source: Stated Respiratory Rate (breaths/min): 20 Weight (lbs): 192 Blood Pressure (mmHg): 144/82 Source: Stated Reference Range: 80 - 120 mg / dl Body Mass Index (BMI): 29.2 Electronic Signature(s) Signed: 08/22/2020 11:52:50 AM By: Carlene Coria RN Entered By: Carlene Coria on 08/22/2020 09:08:06

## 2020-08-22 NOTE — Progress Notes (Signed)
Kim, [name] ([id number]) Visit Report for 08/22/2020 Chief Complaint Document Details Patient Name: Kim, HENKELMAN [name]. Date of Service: 08/22/2020 8:45 AM Medical Record Number: [id number] Patient Account Number: [id number] Date of Birth/Sex: 1940/03/21 (79 y.o. F) Treating RN: Carlene Coria Primary Care Provider: Elsie Stain Other Clinician: Referring Provider: Referral, Self Treating Provider/Extender: Skipper Cliche in Treatment: 0 Information Obtained from: Patient Chief Complaint Patient presents for treatment of an open ulcer due to venous insufficiency Situated at her right medial ankle for about 3 weeks Electronic Signature(s) Signed: 08/22/2020 9:26:19 AM By: [name] PA-C Entered By: [name] on 08/22/2020 09:26:19 [name], [name]. ([id number]) -------------------------------------------------------------------------------- HPI Details Patient Name: [name], [name]. Date of Service: 08/22/2020 8:45 AM Medical Record Number: [id number] Patient Account Number: [id number] Date of Birth/Sex: June 27, 1940 (79 y.o. F) Treating RN: Carlene Coria Primary Care Provider: Elsie Stain Other Clinician: Referring Provider: Referral, Self Treating Provider/Extender: Skipper Cliche in Treatment: 0 History of Present Illness HPI Description: 80 year old patient was recently seen by her PCP [name] and he noted erythema and ulceration and started her on doxycycline. He noted a intact dorsalis pedis pulse.the medial right ankle had skin ulceration which had been treated by Keflex recently and notices made that she had had wound care treatments earlier. past medical history significant for back pain, endometriosis, high cholesterol, hypothyroidism, migraine and venous stasis ulcer treated at [facility]. she has never been a smoker the patient gives a history of right varicose vein stripping done in the year 2000 at [facility]. She has not been wearing her  compression since. 11/05/2016 -- the venous duplex study has been done for reflux and we are awaiting her arterial study to be done. The patient is of a very nervous disposition and seems to be very anxious. 11/15/2016 -- venous duplex reflux study -- venous incompetence noted in the right saphenofemoral junction and the right great saphenous vein on the right lower extremity. The right great saphenous vein was harvested as mentioned above. Arterial study was done on 11/12/2016 but the results are not yet available. I will refer her to see the vascular surgeons for an opinion. 11/22/2016 -- lower extremity arterial duplex examination done showed no hemodynamically significant stenosis in bilateral arterial flow examination. As far as a venous examination goes she had declined to get her left leg studied and the right leg is also noted on 11/15/2016. On asking the patient about this she says she never told the technician not to do her left leg but it may have been a misunderstanding. The patient will be seeing the vascular surgeons soon and I have asked them to address whether the left leg needs a venous duplex study. 12/13/2016 -- was seen by Dr. Leotis Pain on 11/30/2016, who reviewed her recent venous duplex study,a noninvasive arterial study, and after a thorough review has recommended to continue with compression stockings daily and if the venous ulceration recurs then he would consider her for form sclerotherapy of the incompetent accessory saphenous vein. 12/31/2016 -- the patient was recently discharged but comes back with a small open area on her medial foot which is in the region of previous varicosities which are under the care of Dr. Corene Cornea dew. The patient has been very compliant with wearing her compression stockings. 01/14/2017 -- she has done very well and her wounds are now healed. She has still not gotten an appointment to see Dr. Lucky Cowboy and I have urged her to keep up with  this Readmission: 08/22/2020 upon evaluation today patient presents for follow-up concerning issues that she has been having with wounds on her left medial ankle. She tells me that she was actually working outside of roughly a week ago on 08/13/2020 when she does not remember any particular injury but nonetheless ended up with having significant blood in her compression sock when she came back inside. This scared her and they almost went to the hospital but opted not to due to the fact that she was able to get the bleeding stopped. Nonetheless she continued to have issues here with some pain and drainage from the area and though it does not appear to be actively bleeding at this time I still see evidence of an open wound. She does have multiple spider veins noted in and around this area and it was actually from 1 of these that the bleeding began. She has not seen anyone about this and potentially there could be a possibility that Dr. Otho Ket someone could possibly see about doing something to collapse these and prevent this type of thing from going on again. Obviously this would not be cosmetic but now true medical issue since she is already had 1 instance of rupture and spontaneous bleeding despite any specific injury. I discussed this with her today she is definitely interested in checking into this, and I will possibly see about contacting the office of Dr. Doing Dr. Delana Meyer to see if this is something they do or if I would need to refer her to [facility]. Fortunately the patient does not have any significant major medical problems that would affect the healing other than the chronic venous insufficiency. Electronic Signature(s) Signed: 08/22/2020 6:13:29 PM By: [name] PA-C Entered By: [name] on 08/22/2020 18:13:29 [name], [name] ([id number]) -------------------------------------------------------------------------------- Physical Exam Details Patient Name: [name], [name]. Date of  Service: 08/22/2020 8:45 AM Medical Record Number: [id number] Patient Account Number: [id number] Date of Birth/Sex: [birth year] (79 y.o. F) Treating RN: Carlene Coria Primary Care Provider: Elsie Stain Other Clinician: Referring Provider: Referral, Self Treating Provider/Extender: Skipper Cliche in Treatment: 0 Constitutional patient is hypertensive.. pulse regular and within target range for patient.Marland [name] respirations regular, non-labored and within target range for patient.Marland [name] temperature within target range for patient.. Well-nourished and well-hydrated in no acute distress. Eyes conjunctiva clear no eyelid edema noted. pupils equal round and reactive to light and accommodation. Ears, Nose, Mouth, and Throat no gross abnormality of ear auricles or external auditory canals. normal hearing noted during conversation. mucus membranes moist. Respiratory normal breathing without difficulty. Cardiovascular 2+ dorsalis pedis/posterior tibialis pulses. 1+ pitting edema of the bilateral lower extremities. Musculoskeletal normal gait and posture. no significant deformity or arthritic changes, no loss or range of motion, no clubbing. Psychiatric this patient is able to make decisions and demonstrates good insight into disease process. Alert and Oriented x 3. pleasant and cooperative. Notes Upon inspection patient's wound bed actually showed signs of good granulation and I did not see any evidence of a extremely wide open wound which is great news. Overall I think that the patient is more nervous that is why her blood pressure seems to be elevated today as well. She was actually tearful at times she is very concerned. Nonetheless I think that the wound is doing well I do not see any signs of infection and to be honest I think she will probably do quite well with a compression wrap along with appropriate dressings no sharp debridement was necessary today. Electronic  Signature(s) Signed: 08/22/2020 6:14:00  PM By: [name] PA-C Entered By: [name] on 08/22/2020 18:14:00 Ashraf, [name] ([id number]) -------------------------------------------------------------------------------- Physician Orders Details Patient Name: [name]. Date of Service: 08/22/2020 8:45 AM Medical Record Number: [id number] Patient Account Number: [id number] Date of Birth/Sex: August 14, 1940 (79 y.o. F) Treating RN: Carlene Coria Primary Care Provider: Elsie Stain Other Clinician: Referring Provider: Referral, Self Treating Provider/Extender: Skipper Cliche in Treatment: 0 Verbal / Phone Orders: No Diagnosis Coding ICD-10 Coding Code Description I87.2 Venous insufficiency (chronic) (peripheral) L97.322 Non-pressure chronic ulcer of left ankle with fat layer exposed Follow-up Appointments o Return Appointment in 1 week. Edema Control - Lymphedema / Segmental Compressive Device / Other o Optional: One layer of unna paste to top of compression wrap (to act as an anchor). o Elevate, Exercise Daily and Avoid Standing for Long Periods of Time. o Elevate legs to the level of the heart and pump ankles as often as possible o Elevate leg(s) parallel to the floor when sitting. Wound Treatment Wound #3 - Ankle Wound Laterality: Left, Medial Cleanser: Normal Saline 1 x Per Week Discharge Instructions: Wash your hands with soap and water. Remove old dressing, discard into plastic bag and place into trash. Cleanse the wound with Normal Saline prior to applying a clean dressing using gauze sponges, not tissues or cotton balls. Do not scrub or use excessive force. Pat dry using gauze sponges, not tissue or cotton balls. Cleanser: Soap and Water 1 x Per Week Discharge Instructions: Gently cleanse wound with antibacterial soap, rinse and pat dry prior to dressing wounds Primary Dressing: Silvercel 4 1/4x 4 1/4 (in/in) 1 x Per Week Discharge Instructions: Apply Silvercel 4 1/4x 4 1/4 (in/in) as  instructed Secondary Dressing: Gauze 1 x Per Week Discharge Instructions: As directed: dry, moistened with saline or moistened with Dakins Solution Compression Wrap: Profore Lite LF 3 Multilayer Compression Bandaging System 1 x Per Week Discharge Instructions: Apply 3 multi-layer wrap as prescribed. Electronic Signature(s) Signed: 08/22/2020 11:52:50 AM By: Carlene Coria RN Signed: 08/22/2020 6:21:30 PM By: [name] PA-C Entered By: Carlene Coria on 08/22/2020 09:34:24 Benzel, [name] ([id number]) -------------------------------------------------------------------------------- Problem List Details Patient Name: Louissaint, [name]. Date of Service: 08/22/2020 8:45 AM Medical Record Number: [id number] Patient Account Number: [id number] Date of Birth/Sex: [birth year] (79 y.o. F) Treating RN: Carlene Coria Primary Care Provider: Elsie Stain Other Clinician: Referring Provider: Referral, Self Treating Provider/Extender: Skipper Cliche in Treatment: 0 Active Problems ICD-10 Encounter Code Description Active Date MDM Diagnosis I87.2 Venous insufficiency (chronic) (peripheral) 08/22/2020 No Yes L97.322 Non-pressure chronic ulcer of left ankle with fat layer exposed 08/22/2020 No Yes Inactive Problems Resolved Problems Electronic Signature(s) Signed: 08/22/2020 9:26:11 AM By: [name] PA-C Entered By: [name] on 08/22/2020 09:26:11 [name], [name]. ([id number]) -------------------------------------------------------------------------------- Progress Note Details Patient Name: [name], [name]. Date of Service: 08/22/2020 8:45 AM Medical Record Number: [id number] Patient Account Number: [id number] Date of Birth/Sex: 08-22-1940 (79 y.o. F) Treating RN: Carlene Coria Primary Care Provider: Elsie Stain Other Clinician: Referring Provider: Referral, Self Treating Provider/Extender: Skipper Cliche in Treatment: 0 Subjective Chief Complaint Information obtained from  Patient Patient presents for treatment of an open ulcer due to venous insufficiency Situated at her right medial ankle for about 3 weeks History of Present Illness (HPI) 80 year old patient was recently seen by her PCP [name] and he noted erythema and ulceration and started her on doxycycline. He noted a intact dorsalis pedis pulse.the medial right  ankle had skin ulceration which had been treated by Keflex recently and notices made that she had had wound care treatments earlier. past medical history significant for back pain, endometriosis, high cholesterol, hypothyroidism, migraine and venous stasis ulcer treated at [facility]. she has never been a smoker the patient gives a history of right varicose vein stripping done in the year 2000 at [facility]. She has not been wearing her compression since. 11/05/2016 -- the venous duplex study has been done for reflux and we are awaiting her arterial study to be done. The patient is of a very nervous disposition and seems to be very anxious. 11/15/2016 -- venous duplex reflux study -- venous incompetence noted in the right saphenofemoral junction and the right great saphenous vein on the right lower extremity. The right great saphenous vein was harvested as mentioned above. Arterial study was done on 11/12/2016 but the results are not yet available. I will refer her to see the vascular surgeons for an opinion. 11/22/2016 -- lower extremity arterial duplex examination done showed no hemodynamically significant stenosis in bilateral arterial flow examination. As far as a venous examination goes she had declined to get her left leg studied and the right leg is also noted on 11/15/2016. On asking the patient about this she says she never told the technician not to do her left leg but it may have been a misunderstanding. The patient will be seeing the vascular surgeons soon and I have asked them to address whether the left leg needs a venous duplex  study. 12/13/2016 -- was seen by Dr. Leotis Pain on 11/30/2016, who reviewed her recent venous duplex study,a noninvasive arterial study, and after a thorough review has recommended to continue with compression stockings daily and if the venous ulceration recurs then he would consider her for form sclerotherapy of the incompetent accessory saphenous vein. 12/31/2016 -- the patient was recently discharged but comes back with a small open area on her medial foot which is in the region of previous varicosities which are under the care of Dr. Corene Cornea dew. The patient has been very compliant with wearing her compression stockings. 01/14/2017 -- she has done very well and her wounds are now healed. She has still not gotten an appointment to see Dr. Lucky Cowboy and I have urged her to keep up with this Readmission: 08/22/2020 upon evaluation today patient presents for follow-up concerning issues that she has been having with wounds on her left medial ankle. She tells me that she was actually working outside of roughly a week ago on 08/13/2020 when she does not remember any particular injury but nonetheless ended up with having significant blood in her compression sock when she came back inside. This scared her and they almost went to the hospital but opted not to due to the fact that she was able to get the bleeding stopped. Nonetheless she continued to have issues here with some pain and drainage from the area and though it does not appear to be actively bleeding at this time I still see evidence of an open wound. She does have multiple spider veins noted in and around this area and it was actually from 1 of these that the bleeding began. She has not seen anyone about this and potentially there could be a possibility that Dr. Otho Ket someone could possibly see about doing something to collapse these and prevent this type of thing from going on again. Obviously this would not be cosmetic but now true medical issue since she  is already had 1 instance of rupture and spontaneous bleeding despite any specific injury. I discussed this with her today she is definitely interested in checking into this, and I will possibly see about contacting the office of Dr. Doing Dr. Delana Meyer to see if this is something they do or if I would need to refer her to [facility]. Fortunately the patient does not have any significant major medical problems that would affect the healing other than the chronic venous insufficiency. Patient History Information obtained from Patient. Allergies codeine (Reaction: vomiting) Family History Cancer - Siblings, Heart Disease - Father, Stroke - Mother, No family history of Diabetes, Hypertension, Kidney Disease, Lung Disease, Seizures, Thyroid Problems, Tuberculosis. Social History Never smoker, Marital Status - Married, Alcohol Use - Never, Drug Use - No History, Caffeine Use - Daily. Medical History MACAELA, MAUS ([id number]) Eyes Denies history of Cataracts, Glaucoma, Optic Neuritis Ear/Nose/Mouth/Throat Denies history of Chronic sinus problems/congestion, Middle ear problems Hematologic/Lymphatic Denies history of Anemia, Hemophilia, Human Immunodeficiency Virus, Lymphedema, Sickle Cell Disease Respiratory Denies history of Aspiration, Asthma, Chronic Obstructive Pulmonary Disease (COPD), Sleep Apnea, Tuberculosis Cardiovascular Patient has history of Hypertension - medication controlled Denies history of Angina, Arrhythmia, Congestive Heart Failure, Coronary Artery Disease, Deep Vein Thrombosis, Hypotension, Myocardial Infarction, Peripheral Arterial Disease, Peripheral Venous Disease, Phlebitis, Vasculitis Gastrointestinal Denies history of Cirrhosis , Colitis, Crohn s, Hepatitis A, Hepatitis B, Hepatitis C Endocrine Denies history of Type I Diabetes, Type II Diabetes Genitourinary Denies history of End Stage Renal Disease Immunological Denies history of Lupus Erythematosus, Raynaud  s Integumentary (Skin) Denies history of History of Burn, History of pressure wounds Musculoskeletal Denies history of Gout, Rheumatoid Arthritis, Osteoarthritis, Osteomyelitis Neurologic Denies history of Dementia, Neuropathy, Quadriplegia, Paraplegia, Seizure Disorder Oncologic Denies history of Received Chemotherapy, Received Radiation Psychiatric Denies history of Anorexia/bulimia, Confinement Anxiety Medical And Surgical History Notes Cardiovascular Right leg stripped veins (03/1998) Objective Constitutional patient is hypertensive.. pulse regular and within target range for patient.Marland [name] respirations regular, non-labored and within target range for patient.Marland [name] temperature within target range for patient.. Well-nourished and well-hydrated in no acute distress. Vitals Time Taken: 9:06 AM, Height: 68 in, Source: Stated, Weight: 192 lbs, Source: Stated, BMI: 29.2, Temperature: 97.9 F, Pulse: 70 bpm, Respiratory Rate: 20 breaths/min, Blood Pressure: 144/82 mmHg. Eyes conjunctiva clear no eyelid edema noted. pupils equal round and reactive to light and accommodation. Ears, Nose, Mouth, and Throat no gross abnormality of ear auricles or external auditory canals. normal hearing noted during conversation. mucus membranes moist. Respiratory normal breathing without difficulty. Cardiovascular 2+ dorsalis pedis/posterior tibialis pulses. 1+ pitting edema of the bilateral lower extremities. Musculoskeletal normal gait and posture. no significant deformity or arthritic changes, no loss or range of motion, no clubbing. Psychiatric this patient is able to make decisions and demonstrates good insight into disease process. Alert and Oriented x 3. pleasant and cooperative. General Notes: Upon inspection patient's wound bed actually showed signs of good granulation and I did not see any evidence of a extremely wide open wound which is great news. Overall I think that the patient is more nervous that is  why her blood pressure seems to be elevated today as well. She was actually tearful at times she is very concerned. Nonetheless I think that the wound is doing well I do not see any signs of infection and to be honest I think she will probably do quite well with a compression wrap along with appropriate dressings no sharp debridement was [name], [name]. ([id number]) necessary today. Integumentary (  Hair, Skin) Wound #3 status is Open. Original cause of wound was Gradually Appeared. The date acquired was: 08/13/2020. The wound is located on the Left,Medial Ankle. The wound measures 0.4cm length x 0.2cm width x 0.2cm depth; 0.063cm^2 area and 0.013cm^3 volume. There is Fat Layer (Subcutaneous Tissue) exposed. There is no tunneling or undermining noted. There is a medium amount of serosanguineous drainage noted. There is small (1-33%) pink granulation within the wound bed. There is a large (67-100%) amount of necrotic tissue within the wound bed including Adherent Slough. Assessment Active Problems ICD-10 Venous insufficiency (chronic) (peripheral) Non-pressure chronic ulcer of left ankle with fat layer exposed Procedures Wound #3 Pre-procedure diagnosis of Wound #3 is a Venous Leg Ulcer located on the Left,Medial Ankle . There was a Three Layer Compression Therapy Procedure by Carlene Coria, RN. Post procedure Diagnosis Wound #3: Same as Pre-Procedure Plan Follow-up Appointments: Return Appointment in 1 week. Edema Control - Lymphedema / Segmental Compressive Device / Other: Optional: One layer of unna paste to top of compression wrap (to act as an anchor). Elevate, Exercise Daily and Avoid Standing for Long Periods of Time. Elevate legs to the level of the heart and pump ankles as often as possible Elevate leg(s) parallel to the floor when sitting. WOUND #3: - Ankle Wound Laterality: Left, Medial Cleanser: Normal Saline 1 x Per Week/ Discharge Instructions: Wash your hands with soap and  water. Remove old dressing, discard into plastic bag and place into trash. Cleanse the wound with Normal Saline prior to applying a clean dressing using gauze sponges, not tissues or cotton balls. Do not scrub or use excessive force. Pat dry using gauze sponges, not tissue or cotton balls. Cleanser: Soap and Water 1 x Per Week/ Discharge Instructions: Gently cleanse wound with antibacterial soap, rinse and pat dry prior to dressing wounds Primary Dressing: Silvercel 4 1/4x 4 1/4 (in/in) 1 x Per Week/ Discharge Instructions: Apply Silvercel 4 1/4x 4 1/4 (in/in) as instructed Secondary Dressing: Gauze 1 x Per Week/ Discharge Instructions: As directed: dry, moistened with saline or moistened with Dakins Solution Compression Wrap: Profore Lite LF 3 Multilayer Compression Bandaging System 1 x Per Week/ Discharge Instructions: Apply 3 multi-layer wrap as prescribed. 1. I would recommend currently that we go ahead and initiate treatment with silver alginate followed by a 3 layer compression wrap which I think will be sufficient. 2. I would also recommend that we go ahead and have the patient continue to elevate her legs I think this is something that would be beneficial for her as far as helping with any edema and right now I think wearing her compression sock on the right leg is still the right thing to do. 3. With regard to the spider veins of which 1 of these ruptured and cause the bleeding issue I think that she could see about having these collapsed since they seem to be a significant issue for her from a medical standpoint not just cosmetic. Subsequently I Minna check with Dr. Maryruth Bun office that she sees him and see if there is anything that he would recommend in this regard. Otherwise my suggestion would probably be that we make a referral to [facility] to see if there is anything they can do to help her out in this regard. We will see patient back for reevaluation in 1 week here in the clinic. If anything  worsens or changes patient will contact our office for additional recommendations. [name], [name] ([id number]) Electronic Signature(s) Signed: 08/22/2020 6:15:02 PM  By: [name] PA-C Entered By: [name] on 08/22/2020 18:15:02 [name], [name] ([id number]) -------------------------------------------------------------------------------- ROS/PFSH Details Patient Name: [name], Kare [name]. Date of Service: 08/22/2020 8:45 AM Medical Record Number: [id number] Patient Account Number: [id number] Date of Birth/Sex: 04/10/1940 (79 y.o. F) Treating RN: Carlene Coria Primary Care Provider: Elsie Stain Other Clinician: Referring Provider: Referral, Self Treating Provider/Extender: Skipper Cliche in Treatment: 0 Information Obtained From Patient Eyes Medical History: Negative for: Cataracts; Glaucoma; Optic Neuritis Ear/Nose/Mouth/Throat Medical History: Negative for: Chronic sinus problems/congestion; Middle ear problems Hematologic/Lymphatic Medical History: Negative for: Anemia; Hemophilia; Human Immunodeficiency Virus; Lymphedema; Sickle Cell Disease Respiratory Medical History: Negative for: Aspiration; Asthma; Chronic Obstructive Pulmonary Disease (COPD); Sleep Apnea; Tuberculosis Cardiovascular Medical History: Positive for: Hypertension - medication controlled Negative for: Angina; Arrhythmia; Congestive Heart Failure; Coronary Artery Disease; Deep Vein Thrombosis; Hypotension; Myocardial Infarction; Peripheral Arterial Disease; Peripheral Venous Disease; Phlebitis; Vasculitis Past Medical History Notes: Right leg stripped veins (03/1998) Gastrointestinal Medical History: Negative for: Cirrhosis ; Colitis; Crohnos; Hepatitis A; Hepatitis B; Hepatitis C Endocrine Medical History: Negative for: Type I Diabetes; Type II Diabetes Genitourinary Medical History: Negative for: End Stage Renal Disease Immunological Medical History: Negative for: Lupus  Erythematosus; Raynaudos Integumentary (Skin) Medical History: Negative for: History of Burn; History of pressure wounds Musculoskeletal Glacken, Jennaya [name]. ([id number]) Medical History: Negative for: Gout; Rheumatoid Arthritis; Osteoarthritis; Osteomyelitis Neurologic Medical History: Negative for: Dementia; Neuropathy; Quadriplegia; Paraplegia; Seizure Disorder Oncologic Medical History: Negative for: Received Chemotherapy; Received Radiation Psychiatric Medical History: Negative for: Anorexia/bulimia; Confinement Anxiety Immunizations Pneumococcal Vaccine: Received Pneumococcal Vaccination: Yes Received Pneumococcal Vaccination On or After 60th Birthday: Yes Implantable Devices None Family and Social History Cancer: Yes - Siblings; Diabetes: No; Heart Disease: Yes - Father; Hypertension: No; Kidney Disease: No; Lung Disease: No; Seizures: No; Stroke: Yes - Mother; Thyroid Problems: No; Tuberculosis: No; Never smoker; Marital Status - Married; Alcohol Use: Never; Drug Use: No History; Caffeine Use: Daily Electronic Signature(s) Signed: 08/22/2020 11:52:50 AM By: Carlene Coria RN Signed: 08/22/2020 6:21:30 PM By: [name] PA-C Entered By: Carlene Coria on 08/22/2020 09:09:11 Beltre, Orlandria [name] [name] ([id number]) -------------------------------------------------------------------------------- SuperBill Details Patient Name: [name], Aleida [name]. Date of Service: 08/22/2020 Medical Record Number: [id number] Patient Account Number: [id number] Date of Birth/Sex: 1940-11-15 (80 y.o. F) Treating RN: Carlene Coria Primary Care Provider: Elsie Stain Other Clinician: Referring Provider: Referral, Self Treating Provider/Extender: Skipper Cliche in Treatment: 0 Diagnosis Coding ICD-10 Codes Code Description I87.2 Venous insufficiency (chronic) (peripheral) L97.322 Non-pressure chronic ulcer of left ankle with fat layer exposed Facility Procedures CPT4 Code: [id number] Description: [facility] VISIT-LEV 3 EST PT Modifier: Quantity: 1 Physician Procedures CPT4 Code: [id number] Description: [id number] - WC PHYS LEVEL 4 - NEW PT Modifier: Quantity: 1 CPT4 Code: Description: ICD-10 Diagnosis Description I87.2 Venous insufficiency (chronic) (peripheral) L97.322 Non-pressure chronic ulcer of left ankle with fat layer exposed Modifier: Quantity: Electronic Signature(s) Signed: 08/22/2020 6:15:23 PM By: [name] PA-C Entered By: [name] on 08/22/2020 18:15:23

## 2020-08-27 ENCOUNTER — Other Ambulatory Visit: Payer: Self-pay

## 2020-08-27 ENCOUNTER — Encounter: Payer: Medicare Other | Admitting: Physician Assistant

## 2020-08-27 DIAGNOSIS — L97322 Non-pressure chronic ulcer of left ankle with fat layer exposed: Secondary | ICD-10-CM | POA: Diagnosis not present

## 2020-08-27 DIAGNOSIS — I872 Venous insufficiency (chronic) (peripheral): Secondary | ICD-10-CM | POA: Diagnosis not present

## 2020-08-27 NOTE — Progress Notes (Addendum)
[name], LOUGHEED ([id number]) Visit Report for 08/27/2020 Chief Complaint Document Details Patient Name: [name], [name] Date of Service: 08/27/2020 1:15 PM Medical Record Number: [id number] Patient Account Number: [id number] Date of Birth/Sex: 24-Jan-1940 (80 y.o. F) Treating RN: Carlene Coria Primary Care Provider: Elsie Stain Other Clinician: Referring Provider: Elsie Stain Treating Provider/Extender: Skipper Cliche in Treatment: 0 Information Obtained from: Patient Chief Complaint Patient presents for treatment of an open ulcer due to venous insufficiency Situated at her right medial ankle for about 3 weeks Electronic Signature(s) Signed: 08/27/2020 2:05:15 PM By: [name] PA-C Entered By: [name] on 08/27/2020 14:05:15 Mccaskey, [name] ([id number]) -------------------------------------------------------------------------------- HPI Details Patient Name: [name], [name] Date of Service: 08/27/2020 1:15 PM Medical Record Number: [id number] Patient Account Number: [id number] Date of Birth/Sex: [birth year] (80 y.o. F) Treating RN: Carlene Coria Primary Care Provider: Elsie Stain Other Clinician: Referring Provider: Elsie Stain Treating Provider/Extender: Skipper Cliche in Treatment: 0 History of Present Illness HPI Description: 80 year old patient was recently seen by her PCP [name] and he noted erythema and ulceration and started her on doxycycline. He noted a intact dorsalis pedis pulse.the medial right ankle had skin ulceration which had been treated by Keflex recently and notices made that she had had wound care treatments earlier. past medical history significant for back pain, endometriosis, high cholesterol, hypothyroidism, migraine and venous stasis ulcer treated at [facility]. she has never been a smoker the patient gives a history of right varicose vein stripping done in the year 2000 at [facility]. She has not been wearing her  compression since. 11/05/2016 -- the venous duplex study has been done for reflux and we are awaiting her arterial study to be done. The patient is of a very nervous disposition and seems to be very anxious. 11/15/2016 -- venous duplex reflux study -- venous incompetence noted in the right saphenofemoral junction and the right great saphenous vein on the right lower extremity. The right great saphenous vein was harvested as mentioned above. Arterial study was done on 11/12/2016 but the results are not yet available. I will refer her to see the vascular surgeons for an opinion. 11/22/2016 -- lower extremity arterial duplex examination done showed no hemodynamically significant stenosis in bilateral arterial flow examination. As far as a venous examination goes she had declined to get her left leg studied and the right leg is also noted on 11/15/2016. On asking the patient about this she says she never told the technician not to do her left leg but it may have been a misunderstanding. The patient will be seeing the vascular surgeons soon and I have asked them to address whether the left leg needs a venous duplex study. 12/13/2016 -- was seen by Dr. Leotis Pain on 11/30/2016, who reviewed her recent venous duplex study,a noninvasive arterial study, and after a thorough review has recommended to continue with compression stockings daily and if the venous ulceration recurs then he would consider her for form sclerotherapy of the incompetent accessory saphenous vein. 12/31/2016 -- the patient was recently discharged but comes back with a small open area on her medial foot which is in the region of previous varicosities which are under the care of Dr. Corene Cornea dew. The patient has been very compliant with wearing her compression stockings. 01/14/2017 -- she has done very well and her wounds are now healed. She has still not gotten an appointment to see Dr. Lucky Cowboy and I have urged her to keep up with  this Readmission: 08/22/2020 upon evaluation today patient presents for follow-up concerning issues that she has been having with wounds on her left medial ankle. She tells me that she was actually working outside of roughly a week ago on 08/13/2020 when she does not remember any particular injury but nonetheless ended up with having significant blood in her compression sock when she came back inside. This scared her and they almost went to the hospital but opted not to due to the fact that she was able to get the bleeding stopped. Nonetheless she continued to have issues here with some pain and drainage from the area and though it does not appear to be actively bleeding at this time I still see evidence of an open wound. She does have multiple spider veins noted in and around this area and it was actually from 1 of these that the bleeding began. She has not seen anyone about this and potentially there could be a possibility that Dr. Otho Ket someone could possibly see about doing something to collapse these and prevent this type of thing from going on again. Obviously this would not be cosmetic but now true medical issue since she is already had 1 instance of rupture and spontaneous bleeding despite any specific injury. I discussed this with her today she is definitely interested in checking into this, and I will possibly see about contacting the office of Dr. Doing Dr. Delana Meyer to see if this is something they do or if I would need to refer her to [facility]. Fortunately the patient does not have any significant major medical problems that would affect the healing other than the chronic venous insufficiency. 08/27/2020 upon evaluation today patient's wound actually showing signs of excellent improvement which is great news. Fortunately there does not appear to be any signs of active infection which is also excellent news. In general I think that she is making great progress. Electronic Signature(s) Signed:  08/27/2020 2:17:59 PM By: [name] PA-C Entered By: [name] on 08/27/2020 14:17:59 [name], Kalianne TMarland Kitchen ([id number]) -------------------------------------------------------------------------------- Physical Exam Details Patient Name: Besse, Aubriauna T. Date of Service: 08/27/2020 1:15 PM Medical Record Number: [id number] Patient Account Number: [id number] Date of Birth/Sex: [birth year] (80 y.o. F) Treating RN: Carlene Coria Primary Care Provider: Elsie Stain Other Clinician: Referring Provider: Elsie Stain Treating Provider/Extender: Jeri Cos Weeks in Treatment: 0 Constitutional Well-nourished and well-hydrated in no acute distress. Respiratory normal breathing without difficulty. Psychiatric this patient is able to make decisions and demonstrates good insight into disease process. Alert and Oriented x 3. pleasant and cooperative. Notes Upon inspection patient's wound bed showed signs of great granulation and epithelization at this point. I do not see any signs of active infection which is great news and overall very pleased with where things stand today. No fevers, chills, nausea, vomiting, or diarrhea. Electronic Signature(s) Signed: 08/27/2020 2:18:33 PM By: [name] PA-C Entered By: [name] on 08/27/2020 14:18:33 Krontz, Damisha TMarland Kitchen ([id number]) -------------------------------------------------------------------------------- Physician Orders Details Patient Name: [name], Anija T. Date of Service: 08/27/2020 1:15 PM Medical Record Number: [id number] Patient Account Number: [id number] Date of Birth/Sex: [birth year] (80 y.o. F) Treating RN: Carlene Coria Primary Care Provider: Elsie Stain Other Clinician: Referring Provider: Elsie Stain Treating Provider/Extender: Skipper Cliche in Treatment: 0 Verbal / Phone Orders: No Diagnosis Coding ICD-10 Coding Code Description I87.2 Venous insufficiency (chronic) (peripheral) L97.322 Non-pressure  chronic ulcer of left ankle with fat layer exposed Follow-up Appointments o Return Appointment in 1 week. Edema Control -  Lymphedema / Segmental Compressive Device / Other o Optional: One layer of unna paste to top of compression wrap (to act as an anchor). o Elevate, Exercise Daily and Avoid Standing for Long Periods of Time. o Elevate legs to the level of the heart and pump ankles as often as possible o Elevate leg(s) parallel to the floor when sitting. Wound Treatment Wound #3 - Ankle Wound Laterality: Left, Medial Cleanser: Normal Saline 3 x Per Week Discharge Instructions: Wash your hands with soap and water. Remove old dressing, discard into plastic bag and place into trash. Cleanse the wound with Normal Saline prior to applying a clean dressing using gauze sponges, not tissues or cotton balls. Do not scrub or use excessive force. Pat dry using gauze sponges, not tissue or cotton balls. Cleanser: Soap and Water 3 x Per Week Discharge Instructions: Gently cleanse wound with antibacterial soap, rinse and pat dry prior to dressing wounds Primary Dressing: Silvercel 4 1/4x 4 1/4 (in/in) 3 x Per Week Discharge Instructions: Apply Silvercel 4 1/4x 4 1/4 (in/in) as instructed Secondary Dressing: Bordered Gauze Sterile-HBD 4x4 (in/in) 3 x Per Week Discharge Instructions: Cover wound with Bordered Guaze Sterile as directed Secondary Dressing: Gauze 3 x Per Week Discharge Instructions: As directed: dry, moistened with saline or moistened with Dakins Solution Electronic Signature(s) Signed: 08/28/2020 5:38:02 PM By: [name] PA-C Signed: 08/28/2020 8:40:56 PM By: Carlene Coria RN Entered By: Carlene Coria on 08/27/2020 14:12:50 Gaubert, Laquinda T. ([id number]) -------------------------------------------------------------------------------- Problem List Details Patient Name: Reining, Ylonda T. Date of Service: 08/27/2020 1:15 PM Medical Record Number: [id number] Patient Account  Number: [id number] Date of Birth/Sex: 07-31-1940 (80 y.o. F) Treating RN: Carlene Coria Primary Care Provider: Elsie Stain Other Clinician: Referring Provider: Elsie Stain Treating Provider/Extender: Jeri Cos Weeks in Treatment: 0 Active Problems ICD-10 Encounter Code Description Active Date MDM Diagnosis I87.2 Venous insufficiency (chronic) (peripheral) 08/22/2020 No Yes L97.322 Non-pressure chronic ulcer of left ankle with fat layer exposed 08/22/2020 No Yes Inactive Problems Resolved Problems Electronic Signature(s) Signed: 08/27/2020 2:05:09 PM By: [name] PA-C Entered By: [name] on 08/27/2020 14:05:09 Makris, Annet T. ([id number]) -------------------------------------------------------------------------------- Progress Note Details Patient Name: Metayer, Cathlin T. Date of Service: 08/27/2020 1:15 PM Medical Record Number: [id number] Patient Account Number: [id number] Date of Birth/Sex: 1940/02/22 (80 y.o. F) Treating RN: Carlene Coria Primary Care Provider: Elsie Stain Other Clinician: Referring Provider: Elsie Stain Treating Provider/Extender: Skipper Cliche in Treatment: 0 Subjective Chief Complaint Information obtained from Patient Patient presents for treatment of an open ulcer due to venous insufficiency Situated at her right medial ankle for about 3 weeks History of Present Illness (HPI) 80 year old patient was recently seen by her PCP [name] and he noted erythema and ulceration and started her on doxycycline. He noted a intact dorsalis pedis pulse.the medial right ankle had skin ulceration which had been treated by Keflex recently and notices made that she had had wound care treatments earlier. past medical history significant for back pain, endometriosis, high cholesterol, hypothyroidism, migraine and venous stasis ulcer treated at [facility]. she has never been a smoker the patient gives a history of right varicose vein  stripping done in the year 2000 at [facility]. She has not been wearing her compression since. 11/05/2016 -- the venous duplex study has been done for reflux and we are awaiting her arterial study to be done. The patient is of a very nervous disposition and seems to be very anxious. 11/15/2016 -- venous duplex reflux study --  venous incompetence noted in the right saphenofemoral junction and the right great saphenous vein on the right lower extremity. The right great saphenous vein was harvested as mentioned above. Arterial study was done on 11/12/2016 but the results are not yet available. I will refer her to see the vascular surgeons for an opinion. 11/22/2016 -- lower extremity arterial duplex examination done showed no hemodynamically significant stenosis in bilateral arterial flow examination. As far as a venous examination goes she had declined to get her left leg studied and the right leg is also noted on 11/15/2016. On asking the patient about this she says she never told the technician not to do her left leg but it may have been a misunderstanding. The patient will be seeing the vascular surgeons soon and I have asked them to address whether the left leg needs a venous duplex study. 12/13/2016 -- was seen by Dr. Leotis Pain on 11/30/2016, who reviewed her recent venous duplex study,a noninvasive arterial study, and after a thorough review has recommended to continue with compression stockings daily and if the venous ulceration recurs then he would consider her for form sclerotherapy of the incompetent accessory saphenous vein. 12/31/2016 -- the patient was recently discharged but comes back with a small open area on her medial foot which is in the region of previous varicosities which are under the care of Dr. Corene Cornea dew. The patient has been very compliant with wearing her compression stockings. 01/14/2017 -- she has done very well and her wounds are now healed. She has still not gotten an  appointment to see Dr. Lucky Cowboy and I have urged her to keep up with this Readmission: 08/22/2020 upon evaluation today patient presents for follow-up concerning issues that she has been having with wounds on her left medial ankle. She tells me that she was actually working outside of roughly a week ago on 08/13/2020 when she does not remember any particular injury but nonetheless ended up with having significant blood in her compression sock when she came back inside. This scared her and they almost went to the hospital but opted not to due to the fact that she was able to get the bleeding stopped. Nonetheless she continued to have issues here with some pain and drainage from the area and though it does not appear to be actively bleeding at this time I still see evidence of an open wound. She does have multiple spider veins noted in and around this area and it was actually from 1 of these that the bleeding began. She has not seen anyone about this and potentially there could be a possibility that Dr. Otho Ket someone could possibly see about doing something to collapse these and prevent this type of thing from going on again. Obviously this would not be cosmetic but now true medical issue since she is already had 1 instance of rupture and spontaneous bleeding despite any specific injury. I discussed this with her today she is definitely interested in checking into this, and I will possibly see about contacting the office of Dr. Doing Dr. Delana Meyer to see if this is something they do or if I would need to refer her to [facility]. Fortunately the patient does not have any significant major medical problems that would affect the healing other than the chronic venous insufficiency. 08/27/2020 upon evaluation today patient's wound actually showing signs of excellent improvement which is great news. Fortunately there does not appear to be any signs of active infection which is also excellent news. In  general I think that she  is making great progress. Objective Constitutional Casella, Demeshia T. ([id number]) Well-nourished and well-hydrated in no acute distress. Vitals Time Taken: 1:43 PM, Height: 68 in, Weight: 192 lbs, BMI: 29.2, Temperature: 97.7 F, Pulse: 63 bpm, Respiratory Rate: 18 breaths/min, Blood Pressure: 149/58 mmHg. Respiratory normal breathing without difficulty. Psychiatric this patient is able to make decisions and demonstrates good insight into disease process. Alert and Oriented x 3. pleasant and cooperative. General Notes: Upon inspection patient's wound bed showed signs of great granulation and epithelization at this point. I do not see any signs of active infection which is great news and overall very pleased with where things stand today. No fevers, chills, nausea, vomiting, or diarrhea. Integumentary (Hair, Skin) Wound #3 status is Open. Original cause of wound was Gradually Appeared. The date acquired was: 08/13/2020. The wound is located on the Left,Medial Ankle. The wound measures 0.3cm length x 0.2cm width x 0.2cm depth; 0.047cm^2 area and 0.009cm^3 volume. There is Fat Layer (Subcutaneous Tissue) exposed. There is no tunneling or undermining noted. There is a medium amount of serosanguineous drainage noted. There is small (1-33%) pink granulation within the wound bed. There is a large (67-100%) amount of necrotic tissue within the wound bed including Adherent Slough. Assessment Active Problems ICD-10 Venous insufficiency (chronic) (peripheral) Non-pressure chronic ulcer of left ankle with fat layer exposed Plan Follow-up Appointments: Return Appointment in 1 week. Edema Control - Lymphedema / Segmental Compressive Device / Other: Optional: One layer of unna paste to top of compression wrap (to act as an anchor). Elevate, Exercise Daily and Avoid Standing for Long Periods of Time. Elevate legs to the level of the heart and pump ankles as often as possible Elevate leg(s) parallel to  the floor when sitting. WOUND #3: - Ankle Wound Laterality: Left, Medial Cleanser: Normal Saline 3 x Per Week/ Discharge Instructions: Wash your hands with soap and water. Remove old dressing, discard into plastic bag and place into trash. Cleanse the wound with Normal Saline prior to applying a clean dressing using gauze sponges, not tissues or cotton balls. Do not scrub or use excessive force. Pat dry using gauze sponges, not tissue or cotton balls. Cleanser: Soap and Water 3 x Per Week/ Discharge Instructions: Gently cleanse wound with antibacterial soap, rinse and pat dry prior to dressing wounds Primary Dressing: Silvercel 4 1/4x 4 1/4 (in/in) 3 x Per Week/ Discharge Instructions: Apply Silvercel 4 1/4x 4 1/4 (in/in) as instructed Secondary Dressing: Bordered Gauze Sterile-HBD 4x4 (in/in) 3 x Per Week/ Discharge Instructions: Cover wound with Bordered Guaze Sterile as directed Secondary Dressing: Gauze 3 x Per Week/ Discharge Instructions: As directed: dry, moistened with saline or moistened with Dakins Solution 1. Would recommend currently that we actually go ahead and have the patient adjust her compression socks. We will use silver alginate which I think is okay and then subsequently we will have her use her compression socks over top of this to help keep things under control from a compression standpoint. This will allow her as well to be able to go to the bar and dinner theater in [place] which she plans for this Saturday and wear the shoes she wants. 2. I am also can recommend at this time that we have the patient go ahead and continue with the elevation is much as possible to try to help keep edema under control I think up with her new compression socks which seem to be excellent I think she will do quite well.  We will see patient back for reevaluation in 1 week here in the clinic. If anything worsens or changes patient will contact our office for  additional recommendations. JISELLE, MATZNER ([id number]) Electronic Signature(s) Signed: 08/27/2020 2:19:18 PM By: [name] PA-C Entered By: [name] on 08/27/2020 14:19:18 Lamere, Manda T. ([id number]) -------------------------------------------------------------------------------- SuperBill Details Patient Name: [name] Date of Service: 08/27/2020 Medical Record Number: [id number] Patient Account Number: [id number] Date of Birth/Sex: 1940-04-16 (80 y.o. F) Treating RN: Carlene Coria Primary Care Provider: Elsie Stain Other Clinician: Referring Provider: Elsie Stain Treating Provider/Extender: Skipper Cliche in Treatment: 0 Diagnosis Coding ICD-10 Codes Code Description I87.2 Venous insufficiency (chronic) (peripheral) L97.322 Non-pressure chronic ulcer of left ankle with fat layer exposed Facility Procedures CPT4 Code: [id number] Description: [phone] - WOUND CARE VISIT-LEV 2 EST PT Modifier: Quantity: 1 Physician Procedures CPT4 Code: [id number] Description: [id number] - WC PHYS LEVEL 3 - EST PT Modifier: Quantity: 1 CPT4 Code: Description: ICD-10 Diagnosis Description I87.2 Venous insufficiency (chronic) (peripheral) L97.322 Non-pressure chronic ulcer of left ankle with fat layer exposed Modifier: Quantity: Electronic Signature(s) Signed: 08/27/2020 2:19:31 PM By: [name] PA-C Entered By: [name] on 08/27/2020 14:19:30

## 2020-08-29 DIAGNOSIS — M5459 Other low back pain: Secondary | ICD-10-CM | POA: Diagnosis not present

## 2020-08-29 DIAGNOSIS — M47817 Spondylosis without myelopathy or radiculopathy, lumbosacral region: Secondary | ICD-10-CM | POA: Diagnosis not present

## 2020-08-29 NOTE — Progress Notes (Signed)
SANJIDA, HIRALDO ([id number]) Visit Report for 08/27/2020 Arrival Information Details Patient Name: Kim, [name]. Date of Service: 08/27/2020 1:15 PM Medical Record Number: [id number] Patient Account Number: [id number] Date of Birth/Sex: Feb 13, 1940 (79 y.o. F) Treating RN: Carlene Coria Primary Care Imanol Bihl: Elsie Stain Other Clinician: Referring Melvina Pangelinan: Elsie Stain Treating Pati Thinnes/Extender: Skipper Cliche in Treatment: 0 Visit Information History Since Last Visit All ordered tests and consults were completed: No Patient Arrived: Ambulatory Added or deleted any medications: No Arrival Time: 13:37 Any new allergies or adverse reactions: No Accompanied By: husband Had a fall or experienced change in No Transfer Assistance: None activities of daily living that may affect Patient Identification Verified: Yes risk of falls: Secondary Verification Process Completed: Yes Signs or symptoms of abuse/neglect since last visito No Patient Requires Transmission-Based Precautions: No Hospitalized since last visit: No Patient Has Alerts: No Implantable device outside of the clinic excluding No cellular tissue based products placed in the center since last visit: Has Dressing in Place as Prescribed: Yes Has Compression in Place as Prescribed: Yes Pain Present Now: No Electronic Signature(s) Signed: 08/28/2020 8:40:56 PM By: Carlene Coria RN Entered By: Carlene Coria on 08/27/2020 13:43:35 Rizo, [name] ([id number]) -------------------------------------------------------------------------------- Clinic Level of Care Assessment Details Patient Name: [name], [name] Date of Service: 08/27/2020 1:15 PM Medical Record Number: [id number] Patient Account Number: [id number] Date of Birth/Sex: 1940/12/02 (79 y.o. F) Treating RN: Carlene Coria Primary Care Yulia Ulrich: Elsie Stain Other Clinician: Referring Laroy Mustard: Elsie Stain Treating Stephenie Navejas/Extender: Skipper Cliche  in Treatment: 0 Clinic Level of Care Assessment Items TOOL 4 Quantity Score X - Use when only an EandM is performed on FOLLOW-UP visit 1 0 ASSESSMENTS - Nursing Assessment / Reassessment '[]'$  - Reassessment of Co-morbidities (includes updates in patient status) 0 '[]'$  - 0 Reassessment of Adherence to Treatment Plan ASSESSMENTS - Wound and Skin Assessment / Reassessment X - Simple Wound Assessment / Reassessment - one wound 1 5 '[]'$  - 0 Complex Wound Assessment / Reassessment - multiple wounds '[]'$  - 0 Dermatologic / Skin Assessment (not related to wound area) ASSESSMENTS - Focused Assessment '[]'$  - Circumferential Edema Measurements - multi extremities 0 '[]'$  - 0 Nutritional Assessment / Counseling / Intervention '[]'$  - 0 Lower Extremity Assessment (monofilament, tuning fork, pulses) '[]'$  - 0 Peripheral Arterial Disease Assessment (using hand held doppler) ASSESSMENTS - Ostomy and/or Continence Assessment and Care '[]'$  - Incontinence Assessment and Management 0 '[]'$  - 0 Ostomy Care Assessment and Management (repouching, etc.) PROCESS - Coordination of Care X - Simple Patient / Family Education for ongoing care 1 15 '[]'$  - 0 Complex (extensive) Patient / Family Education for ongoing care '[]'$  - 0 Staff obtains Programmer, systems, Records, Test Results / Process Orders '[]'$  - 0 Staff telephones HHA, Nursing Homes / Clarify orders / etc '[]'$  - 0 Routine Transfer to another Facility (non-emergent condition) '[]'$  - 0 Routine Hospital Admission (non-emergent condition) '[]'$  - 0 New Admissions / Biomedical engineer / Ordering NPWT, Apligraf, etc. '[]'$  - 0 Emergency Hospital Admission (emergent condition) X- 1 10 Simple Discharge Coordination '[]'$  - 0 Complex (extensive) Discharge Coordination PROCESS - Special Needs '[]'$  - Pediatric / Minor Patient Management 0 '[]'$  - 0 Isolation Patient Management '[]'$  - 0 Hearing / Language / Visual special needs '[]'$  - 0 Assessment of Community assistance (transportation, D/C  planning, etc.) '[]'$  - 0 Additional assistance / Altered mentation '[]'$  - 0 Support Surface(s) Assessment (bed, cushion, seat, etc.) INTERVENTIONS - Wound Cleansing / Measurement [name], [name] ([id number]) X-  1 5 Simple Wound Cleansing - one wound '[]'$  - 0 Complex Wound Cleansing - multiple wounds X- 1 5 Wound Imaging (photographs - any number of wounds) '[]'$  - 0 Wound Tracing (instead of photographs) X- 1 5 Simple Wound Measurement - one wound '[]'$  - 0 Complex Wound Measurement - multiple wounds INTERVENTIONS - Wound Dressings X - Small Wound Dressing one or multiple wounds 1 10 '[]'$  - 0 Medium Wound Dressing one or multiple wounds '[]'$  - 0 Large Wound Dressing one or multiple wounds '[]'$  - 0 Application of Medications - topical X- 1 10 Application of Medications - injection INTERVENTIONS - Miscellaneous '[]'$  - External ear exam 0 '[]'$  - 0 Specimen Collection (cultures, biopsies, blood, body fluids, etc.) '[]'$  - 0 Specimen(s) / Culture(s) sent or taken to Lab for analysis '[]'$  - 0 Patient Transfer (multiple staff / Civil Service fast streamer / Similar devices) '[]'$  - 0 Simple Staple / Suture removal (25 or less) '[]'$  - 0 Complex Staple / Suture removal (26 or more) '[]'$  - 0 Hypo / Hyperglycemic Management (close monitor of Blood Glucose) '[]'$  - 0 Ankle / Brachial Index (ABI) - do not check if billed separately X- 1 5 Vital Signs Has the patient been seen at the hospital within the last three years: Yes Total Score: 70 Level Of Care: New/Established - Level 2 Electronic Signature(s) Signed: 08/28/2020 8:40:56 PM By: Carlene Coria RN Entered By: Carlene Coria on 08/27/2020 14:13:29 Parmenter, [name] ([id number]) -------------------------------------------------------------------------------- Encounter Discharge Information Details Patient Name: [name], Shaleena T. Date of Service: 08/27/2020 1:15 PM Medical Record Number: [id number] Patient Account Number: [id number] Date of Birth/Sex: [birth year] (79 y.o.  F) Treating RN: Carlene Coria Primary Care Amal Renbarger: Elsie Stain Other Clinician: Referring Cheyenna Pankowski: Elsie Stain Treating Agnieszka Newhouse/Extender: Skipper Cliche in Treatment: 0 Encounter Discharge Information Items Discharge Condition: Stable Ambulatory Status: Ambulatory Discharge Destination: Home Transportation: Private Auto Accompanied By: self Schedule Follow-up Appointment: Yes Clinical Summary of Care: Patient Declined Electronic Signature(s) Signed: 08/28/2020 8:40:56 PM By: Carlene Coria RN Entered By: Carlene Coria on 08/27/2020 14:14:18 Docter, [name] ([id number]) -------------------------------------------------------------------------------- Lower Extremity Assessment Details Patient Name: Kissoon, Tmya T. Date of Service: 08/27/2020 1:15 PM Medical Record Number: [id number] Patient Account Number: [id number] Date of Birth/Sex: 1940/08/05 (79 y.o. F) Treating RN: Carlene Coria Primary Care Sarabeth Benton: Elsie Stain Other Clinician: Referring Zaineb Nowaczyk: Elsie Stain Treating Adaja Wander/Extender: Jeri Cos Weeks in Treatment: 0 Edema Assessment Assessed: [name]: No] [Right: No] Edema: [Left: Ye] [Right: s] Calf Left: Right: Point of Measurement: 35 cm From Medial Instep 36 cm Ankle Left: Right: Point of Measurement: 10 cm From Medial Instep 22 cm Vascular Assessment Pulses: Dorsalis Pedis Palpable: [Left:Yes] Electronic Signature(s) Signed: 08/28/2020 8:40:56 PM By: Carlene Coria RN Entered By: Carlene Coria on 08/27/2020 13:58:54 Decicco, [name] ([id number]) -------------------------------------------------------------------------------- Multi Wound Chart Details Patient Name: [name], [name] Date of Service: 08/27/2020 1:15 PM Medical Record Number: [id number] Patient Account Number: [id number] Date of Birth/Sex: 1940/08/18 (79 y.o. F) Treating RN: Carlene Coria Primary Care Pranshu Lyster: Elsie Stain Other Clinician: Referring Chay Mazzoni: Elsie Stain Treating Finnlee Guarnieri/Extender: Skipper Cliche in Treatment: 0 Vital Signs Height(in): 68 Pulse(bpm): 33 Weight(lbs): 192 Blood Pressure(mmHg): 149/58 Body Mass Index(BMI): 29 Temperature(F): 97.7 Respiratory Rate(breaths/min): 18 Photos: [N/A:N/A] Wound Location: Left, Medial Ankle N/A N/A Wounding Event: Gradually Appeared N/A N/A Primary Etiology: Venous Leg Ulcer N/A N/A Comorbid History: Hypertension N/A N/A Date Acquired: 08/13/2020 N/A N/A Weeks of Treatment: 0 N/A N/A Wound Status: Open N/A N/A Measurements L x W x D (cm)  0.3x0.2x0.2 N/A N/A Area (cm) : 0.047 N/A N/A Volume (cm) : 0.009 N/A N/A % Reduction in Area: 25.40% N/A N/A % Reduction in Volume: 30.80% N/A N/A Classification: Full Thickness Without Exposed N/A N/A Support Structures Exudate Amount: Medium N/A N/A Exudate Type: Serosanguineous N/A N/A Exudate Color: red, brown N/A N/A Granulation Amount: Small (1-33%) N/A N/A Granulation Quality: Pink N/A N/A Necrotic Amount: Large (67-100%) N/A N/A Exposed Structures: Fat Layer (Subcutaneous Tissue): N/A N/A Yes Epithelialization: None N/A N/A Treatment Notes Electronic Signature(s) Signed: 08/28/2020 8:40:56 PM By: Carlene Coria RN Entered By: Carlene Coria on 08/27/2020 14:08:53 Mahmood, [name] ([id number]) -------------------------------------------------------------------------------- [facility] Details Patient Name: Kim Fallen T. Date of Service: 08/27/2020 1:15 PM Medical Record Number: [id number] Patient Account Number: [id number] Date of Birth/Sex: [birth year] (79 y.o. F) Treating RN: Carlene Coria Primary Care Vianka Ertel: Elsie Stain Other Clinician: Referring Jetta Murray: Elsie Stain Treating Mackensi Mahadeo/Extender: Skipper Cliche in Treatment: 0 Active Inactive Abuse / Safety / Falls / Self Care Management Nursing Diagnoses: Potential for falls Goals: Patient will remain injury free related to falls Date  Initiated: 08/22/2020 Target Resolution Date: 09/22/2020 Goal Status: Active Interventions: Assess Activities of Daily Living upon admission and as needed Assess fall risk on admission and as needed Assess: immobility, friction, shearing, incontinence upon admission and as needed Assess impairment of mobility on admission and as needed per policy Assess personal safety and home safety (as indicated) on admission and as needed Assess self care needs on admission and as needed Notes: Wound/Skin Impairment Nursing Diagnoses: Knowledge deficit related to ulceration/compromised skin integrity Goals: Patient/caregiver will verbalize understanding of skin care regimen Date Initiated: 08/22/2020 Target Resolution Date: 09/22/2020 Goal Status: Active Ulcer/skin breakdown will have a volume reduction of 30% by week 4 Date Initiated: 08/22/2020 Target Resolution Date: 09/22/2020 Goal Status: Active Ulcer/skin breakdown will have a volume reduction of 50% by week 8 Date Initiated: 08/22/2020 Target Resolution Date: 10/22/2020 Goal Status: Active Ulcer/skin breakdown will have a volume reduction of 80% by week 12 Date Initiated: 08/22/2020 Target Resolution Date: 11/22/2020 Goal Status: Active Ulcer/skin breakdown will heal within 14 weeks Date Initiated: 08/22/2020 Target Resolution Date: 12/22/2020 Goal Status: Active Interventions: Assess patient/caregiver ability to obtain necessary supplies Assess patient/caregiver ability to perform ulcer/skin care regimen upon admission and as needed Assess ulceration(s) every visit Notes: Electronic Signature(s) Signed: 08/28/2020 8:40:56 PM By: Carlene Coria RN Entered By: Carlene Coria on 08/27/2020 14:08:29 [name], [name] ([id number]) Wieand, [name] ([id number]) -------------------------------------------------------------------------------- Pain Assessment Details Patient Name: [name], Soriya T. Date of Service: 08/27/2020 1:15 PM Medical Record Number:  [id number] Patient Account Number: [id number] Date of Birth/Sex: [birth year] (79 y.o. F) Treating RN: Carlene Coria Primary Care Kentarius Partington: Elsie Stain Other Clinician: Referring Jameriah Trotti: Elsie Stain Treating Kennie Snedden/Extender: Skipper Cliche in Treatment: 0 Active Problems Location of Pain Severity and Description of Pain Patient Has Paino No Site Locations Pain Management and Medication Current Pain Management: Electronic Signature(s) Signed: 08/28/2020 8:40:56 PM By: Carlene Coria RN Entered By: Carlene Coria on 08/27/2020 13:44:03 Oelke, Apryle [name] Kitchen ([id number]) -------------------------------------------------------------------------------- Patient/Caregiver Education Details Patient Name: Kim Fallen T. Date of Service: 08/27/2020 1:15 PM Medical Record Number: [id number] Patient Account Number: [id number] Date of Birth/Gender: 09-Feb-1940 (80 y.o. F) Treating RN: Carlene Coria Primary Care Physician: Elsie Stain Other Clinician: Referring Physician: Elsie Stain Treating Physician/Extender: Skipper Cliche in Treatment: 0 Education Assessment Education Provided To: Patient Education Topics Provided Wound/Skin Impairment: Methods: Explain/Verbal Responses: State content correctly Electronic Signature(s) Signed: 08/28/2020 8:40:56 PM By: Dolores Lory,  Carrie RN Entered By: Carlene Coria on 08/27/2020 14:13:45 [name], [name] ([id number]) -------------------------------------------------------------------------------- Wound Assessment Details Patient Name: Thune, Nakiya T. Date of Service: 08/27/2020 1:15 PM Medical Record Number: [id number] Patient Account Number: [id number] Date of Birth/Sex: 1940-10-10 (79 y.o. F) Treating RN: Carlene Coria Primary Care Breannah Kratt: Elsie Stain Other Clinician: Referring Nakea Gouger: Elsie Stain Treating Coreena Rubalcava/Extender: Jeri Cos Weeks in Treatment: 0 Wound Status Wound Number: 3 Primary Etiology: Venous Leg Ulcer Wound  Location: Left, Medial Ankle Wound Status: Open Wounding Event: Gradually Appeared Comorbid History: Hypertension Date Acquired: 08/13/2020 Weeks Of Treatment: 0 Clustered Wound: No Photos Wound Measurements Length: (cm) 0.3 Width: (cm) 0.2 Depth: (cm) 0.2 Area: (cm) 0.047 Volume: (cm) 0.009 % Reduction in Area: 25.4% % Reduction in Volume: 30.8% Epithelialization: None Tunneling: No Undermining: No Wound Description Classification: Full Thickness Without Exposed Support Structures Exudate Amount: Medium Exudate Type: Serosanguineous Exudate Color: red, brown Foul Odor After Cleansing: No Slough/Fibrino Yes Wound Bed Granulation Amount: Small (1-33%) Exposed Structure Granulation Quality: Pink Fat Layer (Subcutaneous Tissue) Exposed: Yes Necrotic Amount: Large (67-100%) Necrotic Quality: Adherent Slough Treatment Notes Wound #3 (Ankle) Wound Laterality: Left, Medial Cleanser Normal Saline Discharge Instruction: Wash your hands with soap and water. Remove old dressing, discard into plastic bag and place into trash. Cleanse the wound with Normal Saline prior to applying a clean dressing using gauze sponges, not tissues or cotton balls. Do not scrub or use excessive force. Pat dry using gauze sponges, not tissue or cotton balls. Soap and Water Discharge Instruction: Gently cleanse wound with antibacterial soap, rinse and pat dry prior to dressing wounds Peri-Wound Care Cheong, Ivis T. ([id number]) Topical Primary Dressing Silvercel 4 1/4x 4 1/4 (in/in) Discharge Instruction: Apply Silvercel 4 1/4x 4 1/4 (in/in) as instructed Secondary Dressing Bordered Gauze Sterile-HBD 4x4 (in/in) Discharge Instruction: Cover wound with Bordered Guaze Sterile as directed Gauze Discharge Instruction: As directed: dry, moistened with saline or moistened with Dakins Solution Secured With Compression Wrap Compression Stockings Add-Ons Electronic Signature(s) Signed: 08/28/2020  8:40:56 PM By: Carlene Coria RN Entered By: Carlene Coria on 08/27/2020 13:55:25 Pine, Lyrical T. ([id number]) -------------------------------------------------------------------------------- Vitals Details Patient Name: [name], [name] T. Date of Service: 08/27/2020 1:15 PM Medical Record Number: [id number] Patient Account Number: [id number] Date of Birth/Sex: 1940/05/16 (79 y.o. F) Treating RN: Carlene Coria Primary Care Mikele Sifuentes: Elsie Stain Other Clinician: Referring Rhilee Currin: Elsie Stain Treating Rosanne Wohlfarth/Extender: Skipper Cliche in Treatment: 0 Vital Signs Time Taken: 13:43 Temperature (F): 97.7 Height (in): 68 Pulse (bpm): 63 Weight (lbs): 192 Respiratory Rate (breaths/min): 18 Body Mass Index (BMI): 29.2 Blood Pressure (mmHg): 149/58 Reference Range: 80 - 120 mg / dl Electronic Signature(s) Signed: 08/28/2020 8:40:56 PM By: Carlene Coria RN Entered By: Carlene Coria on 08/27/2020 13:43:56

## 2020-08-30 ENCOUNTER — Other Ambulatory Visit: Payer: Self-pay | Admitting: Family Medicine

## 2020-09-02 NOTE — Telephone Encounter (Signed)
Refill request for Valium 5 mg tablets  LOV - 07/31/20   Next OV - not scheduled Last refill - 10/15/19 #30/1

## 2020-09-03 NOTE — Telephone Encounter (Signed)
Sent. Thanks.   

## 2020-09-04 ENCOUNTER — Other Ambulatory Visit: Payer: Self-pay | Admitting: Family Medicine

## 2020-09-04 MED ORDER — GABAPENTIN 300 MG PO CAPS: ORAL_CAPSULE | ORAL | Status: DC

## 2020-09-04 NOTE — Progress Notes (Signed)
D/w pt about trying '900mg'$  total daily dose of gabapentin for RLS.  She'll update me as needed.

## 2020-09-05 ENCOUNTER — Other Ambulatory Visit: Payer: Self-pay

## 2020-09-05 ENCOUNTER — Encounter: Payer: Medicare Other | Admitting: Physician Assistant

## 2020-09-05 DIAGNOSIS — I872 Venous insufficiency (chronic) (peripheral): Secondary | ICD-10-CM | POA: Diagnosis not present

## 2020-09-05 DIAGNOSIS — L97322 Non-pressure chronic ulcer of left ankle with fat layer exposed: Secondary | ICD-10-CM | POA: Diagnosis not present

## 2020-09-05 NOTE — Progress Notes (Addendum)
NYOKA, CLIPPARD ([id number]) Visit Report for 09/05/2020 Arrival Information Details Patient Name: [name], [name]. Date of Service: 09/05/2020 2:15 PM Medical Record Number: [id number] Patient Account Number: [id number] Date of Birth/Sex: Jan 22, 1940 (80 y.o. F) Treating RN: Carlene Coria Primary Care Roena Sassaman: Elsie Stain Other Clinician: Referring Mihira Tozzi: Elsie Stain Treating Arpan Eskelson/Extender: Skipper Cliche in Treatment: 2 Visit Information History Since Last Visit All ordered tests and consults were completed: No Patient Arrived: Ambulatory Added or deleted any medications: No Arrival Time: 14:22 Any new allergies or adverse reactions: No Accompanied By: husband Had a fall or experienced change in No Transfer Assistance: None activities of daily living that may affect Patient Identification Verified: Yes risk of falls: Secondary Verification Process Completed: Yes Signs or symptoms of abuse/neglect since last visito No Patient Requires Transmission-Based Precautions: No Hospitalized since last visit: No Patient Has Alerts: No Implantable device outside of the clinic excluding No cellular tissue based products placed in the center since last visit: Has Dressing in Place as Prescribed: Yes Has Compression in Place as Prescribed: Yes Pain Present Now: No Electronic Signature(s) Signed: 09/10/2020 9:51:49 AM By: Carlene Coria RN Entered By: Carlene Coria on 09/05/2020 14:27:46 Mcknight, Tasheena T. ([id number]) -------------------------------------------------------------------------------- Clinic Level of Care Assessment Details Patient Name: [name], [name] Date of Service: 09/05/2020 2:15 PM Medical Record Number: [id number] Patient Account Number: [id number] Date of Birth/Sex: 1940/04/15 (80 y.o. F) Treating RN: Carlene Coria Primary Care Devantae Babe: Elsie Stain Other Clinician: Referring Sophie Tamez: Elsie Stain Treating Reve Crocket/Extender: Skipper Cliche  in Treatment: 2 Clinic Level of Care Assessment Items TOOL 1 Quantity Score '[]'$  - Use when EandM and Procedure is performed on INITIAL visit 0 ASSESSMENTS - Nursing Assessment / Reassessment '[]'$  - General Physical Exam (combine w/ comprehensive assessment (listed just below) when performed on new 0 pt. evals) '[]'$  - 0 Comprehensive Assessment (HX, ROS, Risk Assessments, Wounds Hx, etc.) ASSESSMENTS - Wound and Skin Assessment / Reassessment '[]'$  - Dermatologic / Skin Assessment (not related to wound area) 0 ASSESSMENTS - Ostomy and/or Continence Assessment and Care '[]'$  - Incontinence Assessment and Management 0 '[]'$  - 0 Ostomy Care Assessment and Management (repouching, etc.) PROCESS - Coordination of Care '[]'$  - Simple Patient / Family Education for ongoing care 0 '[]'$  - 0 Complex (extensive) Patient / Family Education for ongoing care '[]'$  - 0 Staff obtains Programmer, systems, Records, Test Results / Process Orders '[]'$  - 0 Staff telephones HHA, Nursing Homes / Clarify orders / etc '[]'$  - 0 Routine Transfer to another Facility (non-emergent condition) '[]'$  - 0 Routine Hospital Admission (non-emergent condition) '[]'$  - 0 New Admissions / Biomedical engineer / Ordering NPWT, Apligraf, etc. '[]'$  - 0 Emergency Hospital Admission (emergent condition) PROCESS - Special Needs '[]'$  - Pediatric / Minor Patient Management 0 '[]'$  - 0 Isolation Patient Management '[]'$  - 0 Hearing / Language / Visual special needs '[]'$  - 0 Assessment of Community assistance (transportation, D/C planning, etc.) '[]'$  - 0 Additional assistance / Altered mentation '[]'$  - 0 Support Surface(s) Assessment (bed, cushion, seat, etc.) INTERVENTIONS - Miscellaneous '[]'$  - External ear exam 0 '[]'$  - 0 Patient Transfer (multiple staff / Civil Service fast streamer / Similar devices) '[]'$  - 0 Simple Staple / Suture removal (25 or less) '[]'$  - 0 Complex Staple / Suture removal (26 or more) '[]'$  - 0 Hypo/Hyperglycemic Management (do not check if billed separately) '[]'$  -  0 Ankle / Brachial Index (ABI) - do not check if billed separately Has the patient been seen at the hospital within the last  three years: Yes Total Score: 0 Level Of Care: ____ [name] ([id number]) Electronic Signature(s) Signed: 09/10/2020 9:51:49 AM By: Carlene Coria RN Entered By: Carlene Coria on 09/05/2020 14:46:42 Seelbach, Tevis TMarland Kitchen ([id number]) -------------------------------------------------------------------------------- Encounter Discharge Information Details Patient Name: [name], [name] Date of Service: 09/05/2020 2:15 PM Medical Record Number: [id number] Patient Account Number: [id number] Date of Birth/Sex: 03/28/1940 (80 y.o. F) Treating RN: Carlene Coria Primary Care Zionna Homewood: Elsie Stain Other Clinician: Referring Kortlynn Poust: Elsie Stain Treating Adjoa Althouse/Extender: Skipper Cliche in Treatment: 2 Encounter Discharge Information Items Post Procedure Vitals Discharge Condition: Stable Temperature (F): 98 Ambulatory Status: Ambulatory Pulse (bpm): 76 Discharge Destination: Home Respiratory Rate (breaths/min): 18 Transportation: Private Auto Blood Pressure (mmHg): 164/62 Accompanied By: wife Schedule Follow-up Appointment: Yes Clinical Summary of Care: Patient Declined Electronic Signature(s) Signed: 09/10/2020 9:51:49 AM By: Carlene Coria RN Entered By: Carlene Coria on 09/05/2020 14:48:03 Rodin, Kayanna T. ([id number]) -------------------------------------------------------------------------------- Lower Extremity Assessment Details Patient Name: [name], [name] Date of Service: 09/05/2020 2:15 PM Medical Record Number: [id number] Patient Account Number: [id number] Date of Birth/Sex: 1940-11-23 (80 y.o. F) Treating RN: Carlene Coria Primary Care Ethelene Closser: Elsie Stain Other Clinician: Referring Osborn Pullin: Elsie Stain Treating Kerilyn Cortner/Extender: Jeri Cos Weeks in Treatment: 2 Edema Assessment Assessed: [Left: No] [Right: No] Edema: [Left:  Ye] [Right: s] Calf Left: Right: Point of Measurement: 35 cm From Medial Instep 35 cm Ankle Left: Right: Point of Measurement: 10 cm From Medial Instep 22 cm Vascular Assessment Pulses: Dorsalis Pedis Palpable: [Left:Yes] Electronic Signature(s) Signed: 09/10/2020 9:51:49 AM By: Carlene Coria RN Entered By: Carlene Coria on 09/05/2020 14:34:14 Walkup, Rei T. ([id number]) -------------------------------------------------------------------------------- Multi Wound Chart Details Patient Name: [name], Breniyah T. Date of Service: 09/05/2020 2:15 PM Medical Record Number: [id number] Patient Account Number: [id number] Date of Birth/Sex: 1940/04/26 (80 y.o. F) Treating RN: Carlene Coria Primary Care Chevis Weisensel: Elsie Stain Other Clinician: Referring Fina Heizer: Elsie Stain Treating Merrell Rettinger/Extender: Skipper Cliche in Treatment: 2 Vital Signs Height(in): 68 Pulse(bpm): 2 Weight(lbs): 192 Blood Pressure(mmHg): 164/62 Body Mass Index(BMI): 29 Temperature(F): 98 Respiratory Rate(breaths/min): 18 Photos: [N/A:N/A] Wound Location: Left, Medial Ankle N/A N/A Wounding Event: Gradually Appeared N/A N/A Primary Etiology: Venous Leg Ulcer N/A N/A Comorbid History: Hypertension N/A N/A Date Acquired: 08/13/2020 N/A N/A Weeks of Treatment: 2 N/A N/A Wound Status: Open N/A N/A Measurements L x W x D (cm) 0.1x0.1x0.1 N/A N/A Area (cm) : 0.008 N/A N/A Volume (cm) : 0.001 N/A N/A % Reduction in Area: 87.30% N/A N/A % Reduction in Volume: 92.30% N/A N/A Classification: Full Thickness Without Exposed N/A N/A Support Structures Exudate Amount: Medium N/A N/A Exudate Type: Serosanguineous N/A N/A Exudate Color: red, brown N/A N/A Granulation Amount: Small (1-33%) N/A N/A Granulation Quality: Pink N/A N/A Necrotic Amount: Large (67-100%) N/A N/A Exposed Structures: Fat Layer (Subcutaneous Tissue): N/A N/A Yes Epithelialization: None N/A N/A Treatment Notes Electronic  Signature(s) Signed: 09/10/2020 9:51:49 AM By: Carlene Coria RN Entered By: Carlene Coria on 09/05/2020 14:35:02 Marteney, Taniaya Darene Lamer ([id number]) -------------------------------------------------------------------------------- [place] Details Patient Name: [name] Date of Service: 09/05/2020 2:15 PM Medical Record Number: [id number] Patient Account Number: [id number] Date of Birth/Sex: [birth year] (80 y.o. F) Treating RN: Carlene Coria Primary Care Rajveer Handler: Elsie Stain Other Clinician: Referring Joeleen Wortley: Elsie Stain Treating Elora Wolter/Extender: Skipper Cliche in Treatment: 2 Active Inactive Abuse / Safety / Falls / Self Care Management Nursing Diagnoses: Potential for falls Goals: Patient will remain injury free related to falls Date Initiated: 08/22/2020 Target Resolution Date: 09/22/2020 Goal Status: Active Interventions: Assess  Activities of Daily Living upon admission and as needed Assess fall risk on admission and as needed Assess: immobility, friction, shearing, incontinence upon admission and as needed Assess impairment of mobility on admission and as needed per policy Assess personal safety and home safety (as indicated) on admission and as needed Assess self care needs on admission and as needed Notes: Wound/Skin Impairment Nursing Diagnoses: Knowledge deficit related to ulceration/compromised skin integrity Goals: Patient/caregiver will verbalize understanding of skin care regimen Date Initiated: 08/22/2020 Target Resolution Date: 09/22/2020 Goal Status: Active Ulcer/skin breakdown will have a volume reduction of 30% by week 4 Date Initiated: 08/22/2020 Target Resolution Date: 09/22/2020 Goal Status: Active Ulcer/skin breakdown will have a volume reduction of 50% by week 8 Date Initiated: 08/22/2020 Target Resolution Date: 10/22/2020 Goal Status: Active Ulcer/skin breakdown will have a volume reduction of 80% by week 12 Date Initiated:  08/22/2020 Target Resolution Date: 11/22/2020 Goal Status: Active Ulcer/skin breakdown will heal within 14 weeks Date Initiated: 08/22/2020 Target Resolution Date: 12/22/2020 Goal Status: Active Interventions: Assess patient/caregiver ability to obtain necessary supplies Assess patient/caregiver ability to perform ulcer/skin care regimen upon admission and as needed Assess ulceration(s) every visit Notes: Electronic Signature(s) Signed: 09/10/2020 9:51:49 AM By: Carlene Coria RN Entered By: Carlene Coria on 09/05/2020 14:34:39 Donaho, Adalia T. ([id number]) Yebra, Phylicia T. ([id number]) -------------------------------------------------------------------------------- Pain Assessment Details Patient Name: [name], Liliana T. Date of Service: 09/05/2020 2:15 PM Medical Record Number: [id number] Patient Account Number: [id number] Date of Birth/Sex: [birth year] (80 y.o. F) Treating RN: Carlene Coria Primary Care Thelonious Kauffmann: Elsie Stain Other Clinician: Referring Timoteo Carreiro: Elsie Stain Treating Jaidin Richison/Extender: Skipper Cliche in Treatment: 2 Active Problems Location of Pain Severity and Description of Pain Patient Has Paino No Site Locations Pain Management and Medication Current Pain Management: Electronic Signature(s) Signed: 09/10/2020 9:51:49 AM By: Carlene Coria RN Entered By: Carlene Coria on 09/05/2020 14:28:44 Asch, Caren Macadam ([id number]) -------------------------------------------------------------------------------- Patient/Caregiver Education Details Patient Name: [name] Date of Service: 09/05/2020 2:15 PM Medical Record Number: [id number] Patient Account Number: [id number] Date of Birth/Gender: 1941/01/04 (80 y.o. F) Treating RN: Carlene Coria Primary Care Physician: Elsie Stain Other Clinician: Referring Physician: Elsie Stain Treating Physician/Extender: Skipper Cliche in Treatment: 2 Education Assessment Education Provided To: Patient Education  Topics Provided Wound/Skin Impairment: Methods: Explain/Verbal Responses: State content correctly Electronic Signature(s) Signed: 09/10/2020 9:51:49 AM By: Carlene Coria RN Entered By: Carlene Coria on 09/05/2020 14:46:58 Richeson, Sol T. ([id number]) -------------------------------------------------------------------------------- Wound Assessment Details Patient Name: Heesch, Shanaya T. Date of Service: 09/05/2020 2:15 PM Medical Record Number: [id number] Patient Account Number: [id number] Date of Birth/Sex: 1940-10-07 (80 y.o. F) Treating RN: Carlene Coria Primary Care Graison Leinberger: Elsie Stain Other Clinician: Referring Aidan Caloca: Elsie Stain Treating Kelli Robeck/Extender: Jeri Cos Weeks in Treatment: 2 Wound Status Wound Number: 3 Primary Etiology: Venous Leg Ulcer Wound Location: Left, Medial Ankle Wound Status: Open Wounding Event: Gradually Appeared Comorbid History: Hypertension Date Acquired: 08/13/2020 Weeks Of Treatment: 2 Clustered Wound: No Photos Wound Measurements Length: (cm) 0.1 Width: (cm) 0.1 Depth: (cm) 0.1 Area: (cm) 0.008 Volume: (cm) 0.001 % Reduction in Area: 87.3% % Reduction in Volume: 92.3% Epithelialization: None Tunneling: No Undermining: No Wound Description Classification: Full Thickness Without Exposed Support Structures Exudate Amount: Medium Exudate Type: Serosanguineous Exudate Color: red, brown Foul Odor After Cleansing: No Slough/Fibrino Yes Wound Bed Granulation Amount: Small (1-33%) Exposed Structure Granulation Quality: Pink Fat Layer (Subcutaneous Tissue) Exposed: Yes Necrotic Amount: Large (67-100%) Necrotic Quality: Adherent Slough Treatment Notes Wound #3 (Ankle) Wound Laterality: Left, Medial Cleanser Normal Saline  Discharge Instruction: Wash your hands with soap and water. Remove old dressing, discard into plastic bag and place into trash. Cleanse the wound with Normal Saline prior to applying a clean dressing using  gauze sponges, not tissues or cotton balls. Do not scrub or use excessive force. Pat dry using gauze sponges, not tissue or cotton balls. Soap and Water Discharge Instruction: Gently cleanse wound with antibacterial soap, rinse and pat dry prior to dressing wounds Peri-Wound Care Coppolino, Auriella T. ([id number]) Topical Primary Dressing Silvercel 4 1/4x 4 1/4 (in/in) Discharge Instruction: Apply Silvercel 4 1/4x 4 1/4 (in/in) as instructed Secondary Dressing Bordered Gauze Sterile-HBD 4x4 (in/in) Discharge Instruction: Cover wound with Bordered Guaze Sterile as directed Gauze Discharge Instruction: As directed: dry, moistened with saline or moistened with Dakins Solution Secured With Compression Wrap Compression Stockings Add-Ons Electronic Signature(s) Signed: 09/10/2020 9:51:49 AM By: Carlene Coria RN Entered By: Carlene Coria on 09/05/2020 14:33:17 Sotto, Jourdin T. ([id number]) -------------------------------------------------------------------------------- [place] Details Patient Name: [name], Arlana T. Date of Service: 09/05/2020 2:15 PM Medical Record Number: [id number] Patient Account Number: [id number] Date of Birth/Sex: 1940/09/07 (80 y.o. F) Treating RN: Carlene Coria Primary Care Venancio Chenier: Elsie Stain Other Clinician: Referring Davide Risdon: Elsie Stain Treating Kejuan Bekker/Extender: Skipper Cliche in Treatment: 2 Vital Signs Time Taken: 14:27 Temperature (F): 98 Height (in): 68 Pulse (bpm): 76 Weight (lbs): 192 Respiratory Rate (breaths/min): 18 Body Mass Index (BMI): 29.2 Blood Pressure (mmHg): 164/62 Reference Range: 80 - 120 mg / dl Electronic Signature(s) Signed: 09/10/2020 9:51:49 AM By: Carlene Coria RN Entered By: Carlene Coria on 09/05/2020 14:28:17

## 2020-09-05 NOTE — Progress Notes (Addendum)
NYAJA, BARREAU ([id number]) Visit Report for 09/05/2020 Chief Complaint Document Details Patient Name: [name], [name]. Date of Service: 09/05/2020 2:15 PM Medical Record Number: [id number] Patient Account Number: [id number] Date of Birth/Sex: [birth year] (79 y.o. F) Treating RN: Carlene Coria Primary Care Provider: Elsie Stain Other Clinician: Referring Provider: Elsie Stain Treating Provider/Extender: Skipper Cliche in Treatment: 2 Information Obtained from: Patient Chief Complaint Patient presents for treatment of an open ulcer due to venous insufficiency Situated at her right medial ankle for about 3 weeks Electronic Signature(s) Signed: 09/05/2020 2:23:57 PM By: [name] PA-C Entered By: [name] on 09/05/2020 14:23:57 [name]. ([id number]) -------------------------------------------------------------------------------- Debridement Details Patient Name: [name], [name]. Date of Service: 09/05/2020 2:15 PM Medical Record Number: [id number] Patient Account Number: [id number] Date of Birth/Sex: 06-02-1940 (79 y.o. F) Treating RN: Carlene Coria Primary Care Provider: Elsie Stain Other Clinician: Referring Provider: Elsie Stain Treating Provider/Extender: Skipper Cliche in Treatment: 2 Debridement Performed for Wound #3 Left,Medial Ankle Assessment: Performed By: Physician Tommie Sams., PA-C Debridement Type: Debridement Severity of Tissue Pre Debridement: Fat layer exposed Level of Consciousness (Pre- Awake and Alert procedure): Pre-procedure Verification/Time Out Yes - 14:43 Taken: Start Time: 14:43 Pain Control: Lidocaine 4% Topical Solution Total Area Debrided (L x W): 0.1 (cm) x 0.1 (cm) = 0.01 (cm) Tissue and other material Viable, Non-Viable, Slough, Subcutaneous, Skin: Dermis , Skin: Epidermis, Slough debrided: Level: Skin/Subcutaneous Tissue Debridement Description: Excisional Instrument: Curette Bleeding:  Minimum Hemostasis Achieved: Pressure End Time: 14:45 Procedural Pain: 0 Post Procedural Pain: 0 Response to Treatment: Procedure was tolerated well Level of Consciousness (Post- Awake and Alert procedure): Post Debridement Measurements of Total Wound Length: (cm) 0.2 Width: (cm) 0.2 Depth: (cm) 0.1 Volume: (cm) 0.003 Character of Wound/Ulcer Post Debridement: Improved Severity of Tissue Post Debridement: Fat layer exposed Post Procedure Diagnosis Same as Pre-procedure Electronic Signature(s) Signed: 09/05/2020 5:46:21 PM By: [name] PA-C Signed: 09/10/2020 9:51:49 AM By: Carlene Coria RN Entered By: Carlene Coria on 09/05/2020 14:44:28 [name], [name]. ([id number]) -------------------------------------------------------------------------------- HPI Details Patient Name: [name], [name]. Date of Service: 09/05/2020 2:15 PM Medical Record Number: [id number] Patient Account Number: [id number] Date of Birth/Sex: 10/24/1940 (79 y.o. F) Treating RN: Carlene Coria Primary Care Provider: Elsie Stain Other Clinician: Referring Provider: Elsie Stain Treating Provider/Extender: Skipper Cliche in Treatment: 2 History of Present Illness HPI Description: 80 year old patient was recently seen by her PCP [name] and he noted erythema and ulceration and started her on doxycycline. He noted a intact dorsalis pedis pulse.the medial right ankle had skin ulceration which had been treated by Keflex recently and notices made that she had had wound care treatments earlier. past medical history significant for back pain, endometriosis, high cholesterol, hypothyroidism, migraine and venous stasis ulcer treated at [facility]. she has never been a smoker the patient gives a history of right varicose vein stripping done in the year 2000 at [facility]. She has not been wearing her compression since. 11/05/2016 -- the venous duplex study has been done for reflux and we are awaiting  her arterial study to be done. The patient is of a very nervous disposition and seems to be very anxious. 11/15/2016 -- venous duplex reflux study -- venous incompetence noted in the right saphenofemoral junction and the right great saphenous vein on the right lower extremity. The right great saphenous vein was harvested as mentioned above. Arterial study was done on 11/12/2016 but the results are not yet available. I will  refer her to see the vascular surgeons for an opinion. 11/22/2016 -- lower extremity arterial duplex examination done showed no hemodynamically significant stenosis in bilateral arterial flow examination. As far as a venous examination goes she had declined to get her left leg studied and the right leg is also noted on 11/15/2016. On asking the patient about this she says she never told the technician not to do her left leg but it may have been a misunderstanding. The patient will be seeing the vascular surgeons soon and I have asked them to address whether the left leg needs a venous duplex study. 12/13/2016 -- was seen by Dr. Leotis Pain on 11/30/2016, who reviewed her recent venous duplex study,a noninvasive arterial study, and after a thorough review has recommended to continue with compression stockings daily and if the venous ulceration recurs then he would consider her for form sclerotherapy of the incompetent accessory saphenous vein. 12/31/2016 -- the patient was recently discharged but comes back with a small open area on her medial foot which is in the region of previous varicosities which are under the care of Dr. Corene Cornea dew. The patient has been very compliant with wearing her compression stockings. 01/14/2017 -- she has done very well and her wounds are now healed. She has still not gotten an appointment to see Dr. Lucky Cowboy and I have urged her to keep up with this Readmission: 08/22/2020 upon evaluation today patient presents for follow-up concerning issues that she has  been having with wounds on her left medial ankle. She tells me that she was actually working outside of roughly a week ago on 08/13/2020 when she does not remember any particular injury but nonetheless ended up with having significant blood in her compression sock when she came back inside. This scared her and they almost went to the hospital but opted not to due to the fact that she was able to get the bleeding stopped. Nonetheless she continued to have issues here with some pain and drainage from the area and though it does not appear to be actively bleeding at this time I still see evidence of an open wound. She does have multiple spider veins noted in and around this area and it was actually from 1 of these that the bleeding began. She has not seen anyone about this and potentially there could be a possibility that Dr. Otho Ket someone could possibly see about doing something to collapse these and prevent this type of thing from going on again. Obviously this would not be cosmetic but now true medical issue since she is already had 1 instance of rupture and spontaneous bleeding despite any specific injury. I discussed this with her today she is definitely interested in checking into this, and I will possibly see about contacting the office of Dr. Doing Dr. Delana Meyer to see if this is something they do or if I would need to refer her to [facility]. Fortunately the patient does not have any significant major medical problems that would affect the healing other than the chronic venous insufficiency. 08/27/2020 upon evaluation today patient's wound actually showing signs of excellent improvement which is great news. Fortunately there does not appear to be any signs of active infection which is also excellent news. In general I think that she is making great progress. 09/05/2020 upon evaluation today patient presents for reevaluation in regard to her ankle ulcer. This actually is showing signs of improvement  and again is very small and very pleased with what I see today.  She has done well with her compression sock at home using diet and the silver alginate along with a border foam to cover. In general I think she is very close to complete resolution. Electronic Signature(s) Signed: 09/05/2020 5:31:08 PM By: [name] PA-C Entered By: [name] on 09/05/2020 17:31:08 [name], [name] ([id number]) -------------------------------------------------------------------------------- Physical Exam Details Patient Name: [name], [name]. Date of Service: 09/05/2020 2:15 PM Medical Record Number: [id number] Patient Account Number: [id number] Date of Birth/Sex: Apr 20, 1940 (79 y.o. F) Treating RN: Carlene Coria Primary Care Provider: Elsie Stain Other Clinician: Referring Provider: Elsie Stain Treating Provider/Extender: Jeri Cos Weeks in Treatment: 2 Constitutional Well-nourished and well-hydrated in no acute distress. Respiratory normal breathing without difficulty. Psychiatric this patient is able to make decisions and demonstrates good insight into disease process. Alert and Oriented x 3. pleasant and cooperative. Notes Upon inspection patient's wound bed showed signs of good granulation epithelization at this point. Fortunately there does not appear to be any evidence of active infection which is great news and overall I am extremely pleased with where things stand today. No fevers, chills, nausea, vomiting, or diarrhea. Electronic Signature(s) Signed: 09/05/2020 5:31:22 PM By: [name] PA-C Entered By: [name] on 09/05/2020 17:31:22 [name], [name] ([id number]) -------------------------------------------------------------------------------- Physician Orders Details Patient Name: [name], Diva [name]. Date of Service: 09/05/2020 2:15 PM Medical Record Number: [id number] Patient Account Number: [id number] Date of Birth/Sex: 09/01/1940 (79 y.o. F) Treating RN:  Carlene Coria Primary Care Provider: Elsie Stain Other Clinician: Referring Provider: Elsie Stain Treating Provider/Extender: Skipper Cliche in Treatment: 2 Verbal / Phone Orders: No Diagnosis Coding ICD-10 Coding Code Description I87.2 Venous insufficiency (chronic) (peripheral) L97.322 Non-pressure chronic ulcer of left ankle with fat layer exposed Follow-up Appointments o Return Appointment in 1 week. Edema Control - Lymphedema / Segmental Compressive Device / Other o Optional: One layer of unna paste to top of compression wrap (to act as an anchor). o Elevate, Exercise Daily and Avoid Standing for Long Periods of Time. o Elevate legs to the level of the heart and pump ankles as often as possible o Elevate leg(s) parallel to the floor when sitting. Wound Treatment Wound #3 - Ankle Wound Laterality: Left, Medial Cleanser: Normal Saline 3 x Per Week Discharge Instructions: Wash your hands with soap and water. Remove old dressing, discard into plastic bag and place into trash. Cleanse the wound with Normal Saline prior to applying a clean dressing using gauze sponges, not tissues or cotton balls. Do not scrub or use excessive force. Pat dry using gauze sponges, not tissue or cotton balls. Cleanser: Soap and Water 3 x Per Week Discharge Instructions: Gently cleanse wound with antibacterial soap, rinse and pat dry prior to dressing wounds Primary Dressing: Silvercel 4 1/4x 4 1/4 (in/in) 3 x Per Week Discharge Instructions: Apply Silvercel 4 1/4x 4 1/4 (in/in) as instructed Secondary Dressing: Bordered Gauze Sterile-HBD 4x4 (in/in) 3 x Per Week Discharge Instructions: Cover wound with Bordered Guaze Sterile as directed Secondary Dressing: Gauze 3 x Per Week Discharge Instructions: As directed: dry, moistened with saline or moistened with Dakins Solution Electronic Signature(s) Signed: 09/05/2020 5:46:21 PM By: [name] PA-C Signed: 09/10/2020 9:51:49 AM By: Carlene Coria RN Entered By: Carlene Coria on 09/05/2020 14:44:56 [name], [name]. ([id number]) -------------------------------------------------------------------------------- Problem List Details Patient Name: Pavlak, Gwendolyn [name]. Date of Service: 09/05/2020 2:15 PM Medical Record Number: [id number] Patient Account Number: [id number] Date of Birth/Sex: 1940/06/13 (79 y.o. F) Treating RN: Carlene Coria Primary Care  Provider: Elsie Stain Other Clinician: Referring Provider: Elsie Stain Treating Provider/Extender: Skipper Cliche in Treatment: 2 Active Problems ICD-10 Encounter Code Description Active Date MDM Diagnosis I87.2 Venous insufficiency (chronic) (peripheral) 08/22/2020 No Yes L97.322 Non-pressure chronic ulcer of left ankle with fat layer exposed 08/22/2020 No Yes Inactive Problems Resolved Problems Electronic Signature(s) Signed: 09/05/2020 2:23:52 PM By: [name] PA-C Entered By: [name] on 09/05/2020 14:23:51 [name], [name]. ([id number]) -------------------------------------------------------------------------------- Progress Note Details Patient Name: [name], [name]. Date of Service: 09/05/2020 2:15 PM Medical Record Number: [id number] Patient Account Number: [id number] Date of Birth/Sex: [birth year] (79 y.o. F) Treating RN: Carlene Coria Primary Care Provider: Elsie Stain Other Clinician: Referring Provider: Elsie Stain Treating Provider/Extender: Skipper Cliche in Treatment: 2 Subjective Chief Complaint Information obtained from Patient Patient presents for treatment of an open ulcer due to venous insufficiency Situated at her right medial ankle for about 3 weeks History of Present Illness (HPI) 80 year old patient was recently seen by her PCP [name] and he noted erythema and ulceration and started her on doxycycline. He noted a intact dorsalis pedis pulse.the medial right ankle had skin ulceration which had been treated by Keflex  recently and notices made that she had had wound care treatments earlier. past medical history significant for back pain, endometriosis, high cholesterol, hypothyroidism, migraine and venous stasis ulcer treated at [facility] she has never been a smoker the patient gives a history of right varicose vein stripping done in the year 2000 at [facility]. She has not been wearing her compression since. 11/05/2016 -- the venous duplex study has been done for reflux and we are awaiting her arterial study to be done. The patient is of a very nervous disposition and seems to be very anxious. 11/15/2016 -- venous duplex reflux study -- venous incompetence noted in the right saphenofemoral junction and the right great saphenous vein on the right lower extremity. The right great saphenous vein was harvested as mentioned above. Arterial study was done on 11/12/2016 but the results are not yet available. I will refer her to see the vascular surgeons for an opinion. 11/22/2016 -- lower extremity arterial duplex examination done showed no hemodynamically significant stenosis in bilateral arterial flow examination. As far as a venous examination goes she had declined to get her left leg studied and the right leg is also noted on 11/15/2016. On asking the patient about this she says she never told the technician not to do her left leg but it may have been a misunderstanding. The patient will be seeing the vascular surgeons soon and I have asked them to address whether the left leg needs a venous duplex study. 12/13/2016 -- was seen by Dr. Leotis Pain on 11/30/2016, who reviewed her recent venous duplex study,a noninvasive arterial study, and after a thorough review has recommended to continue with compression stockings daily and if the venous ulceration recurs then he would consider her for form sclerotherapy of the incompetent accessory saphenous vein. 12/31/2016 -- the patient was recently discharged but comes back  with a small open area on her medial foot which is in the region of previous varicosities which are under the care of Dr. Corene Cornea dew. The patient has been very compliant with wearing her compression stockings. 01/14/2017 -- she has done very well and her wounds are now healed. She has still not gotten an appointment to see Dr. Lucky Cowboy and I have urged her to keep up with this Readmission: 08/22/2020 upon evaluation today patient  presents for follow-up concerning issues that she has been having with wounds on her left medial ankle. She tells me that she was actually working outside of roughly a week ago on 08/13/2020 when she does not remember any particular injury but nonetheless ended up with having significant blood in her compression sock when she came back inside. This scared her and they almost went to the hospital but opted not to due to the fact that she was able to get the bleeding stopped. Nonetheless she continued to have issues here with some pain and drainage from the area and though it does not appear to be actively bleeding at this time I still see evidence of an open wound. She does have multiple spider veins noted in and around this area and it was actually from 1 of these that the bleeding began. She has not seen anyone about this and potentially there could be a possibility that Dr. Otho Ket someone could possibly see about doing something to collapse these and prevent this type of thing from going on again. Obviously this would not be cosmetic but now true medical issue since she is already had 1 instance of rupture and spontaneous bleeding despite any specific injury. I discussed this with her today she is definitely interested in checking into this, and I will possibly see about contacting the office of Dr. Doing Dr. Delana Meyer to see if this is something they do or if I would need to refer her to [facility]. Fortunately the patient does not have any significant major medical problems that would  affect the healing other than the chronic venous insufficiency. 08/27/2020 upon evaluation today patient's wound actually showing signs of excellent improvement which is great news. Fortunately there does not appear to be any signs of active infection which is also excellent news. In general I think that she is making great progress. 09/05/2020 upon evaluation today patient presents for reevaluation in regard to her ankle ulcer. This actually is showing signs of improvement and again is very small and very pleased with what I see today. She has done well with her compression sock at home using diet and the silver alginate along with a border foam to cover. In general I think she is very close to complete resolution. [name], [name]. ([id number]) Objective Constitutional Well-nourished and well-hydrated in no acute distress. Vitals Time Taken: 2:27 PM, Height: 68 in, Weight: 192 lbs, BMI: 29.2, Temperature: 98 F, Pulse: 76 bpm, Respiratory Rate: 18 breaths/min, Blood Pressure: 164/62 mmHg. Respiratory normal breathing without difficulty. Psychiatric this patient is able to make decisions and demonstrates good insight into disease process. Alert and Oriented x 3. pleasant and cooperative. General Notes: Upon inspection patient's wound bed showed signs of good granulation epithelization at this point. Fortunately there does not appear to be any evidence of active infection which is great news and overall I am extremely pleased with where things stand today. No fevers, chills, nausea, vomiting, or diarrhea. Integumentary (Hair, Skin) Wound #3 status is Open. Original cause of wound was Gradually Appeared. The date acquired was: 08/13/2020. The wound has been in treatment 2 weeks. The wound is located on the Left,Medial Ankle. The wound measures 0.1cm length x 0.1cm width x 0.1cm depth; 0.008cm^2 area and 0.001cm^3 volume. There is Fat Layer (Subcutaneous Tissue) exposed. There is no tunneling or  undermining noted. There is a medium amount of serosanguineous drainage noted. There is small (1-33%) pink granulation within the wound bed. There is a large (67-100%) amount of  necrotic tissue within the wound bed including Adherent Slough. Assessment Active Problems ICD-10 Venous insufficiency (chronic) (peripheral) Non-pressure chronic ulcer of left ankle with fat layer exposed Procedures Wound #3 Pre-procedure diagnosis of Wound #3 is a Venous Leg Ulcer located on the Left,Medial Ankle .Severity of Tissue Pre Debridement is: Fat layer exposed. There was a Excisional Skin/Subcutaneous Tissue Debridement with a total area of 0.01 sq cm performed by Tommie Sams., PA-C. With the following instrument(s): Curette to remove Viable and Non-Viable tissue/material. Material removed includes Subcutaneous Tissue, Slough, Skin: Dermis, and Skin: Epidermis after achieving pain control using Lidocaine 4% Topical Solution. No specimens were taken. A time out was conducted at 14:43, prior to the start of the procedure. A Minimum amount of bleeding was controlled with Pressure. The procedure was tolerated well with a pain level of 0 throughout and a pain level of 0 following the procedure. Post Debridement Measurements: 0.2cm length x 0.2cm width x 0.1cm depth; 0.003cm^3 volume. Character of Wound/Ulcer Post Debridement is improved. Severity of Tissue Post Debridement is: Fat layer exposed. Post procedure Diagnosis Wound #3: Same as Pre-Procedure Plan Follow-up Appointments: Return Appointment in 1 week. Edema Control - Lymphedema / Segmental Compressive Device / Other: Optional: One layer of unna paste to top of compression wrap (to act as an anchor). Elevate, Exercise Daily and Avoid Standing for Long Periods of Time. Elevate legs to the level of the heart and pump ankles as often as possible Elevate leg(s) parallel to the floor when sitting. WOUND #3: - Ankle Wound Laterality: Left,  Medial Cleanser: Normal Saline 3 x Per Week/ Discharge Instructions: Wash your hands with soap and water. Remove old dressing, discard into plastic bag and place into trash. Cleanse the wound with Normal Saline prior to applying a clean dressing using gauze sponges, not tissues or cotton balls. Do not scrub or use [name], [name]. ([id number]) excessive force. Pat dry using gauze sponges, not tissue or cotton balls. Cleanser: Soap and Water 3 x Per Week/ Discharge Instructions: Gently cleanse wound with antibacterial soap, rinse and pat dry prior to dressing wounds Primary Dressing: Silvercel 4 1/4x 4 1/4 (in/in) 3 x Per Week/ Discharge Instructions: Apply Silvercel 4 1/4x 4 1/4 (in/in) as instructed Secondary Dressing: Bordered Gauze Sterile-HBD 4x4 (in/in) 3 x Per Week/ Discharge Instructions: Cover wound with Bordered Guaze Sterile as directed Secondary Dressing: Gauze 3 x Per Week/ Discharge Instructions: As directed: dry, moistened with saline or moistened with Dakins Solution 1. I would recommend that we go ahead and continue with the silver alginate dressing which I think is doing a great job followed by the [employer] to cover. 2. I am also can recommend that we continue with the compression socks to be worn over top I think that still the best way to go. We will see patient back for reevaluation in 1 week here in the clinic. If anything worsens or changes patient will contact our office for additional recommendations. Electronic Signature(s) Signed: 09/05/2020 5:31:51 PM By: [name] PA-C Entered By: [name] on 09/05/2020 17:31:51 [name], [name]. ([id number]) -------------------------------------------------------------------------------- SuperBill Details Patient Name: [name]. Date of Service: 09/05/2020 Medical Record Number: [id number] Patient Account Number: [id number] Date of Birth/Sex: 1940-04-07 (80 y.o. F) Treating RN: Carlene Coria Primary Care  Provider: Elsie Stain Other Clinician: Referring Provider: Elsie Stain Treating Provider/Extender: Skipper Cliche in Treatment: 2 Diagnosis Coding ICD-10 Codes Code Description I87.2 Venous insufficiency (chronic) (peripheral) L97.322 Non-pressure chronic ulcer of left ankle with  fat layer exposed Facility Procedures CPT4 Code: [id number] Description: [id number] - DEB SUBQ TISSUE 20 SQ CM/< Modifier: Quantity: 1 CPT4 Code: Description: ICD-10 Diagnosis Description [id number] Non-pressure chronic ulcer of left ankle with fat layer exposed Modifier: Quantity: Physician Procedures CPT4 Code: [id number] Description: [id number] - WC PHYS SUBQ TISS 20 SQ CM Modifier: Quantity: 1 CPT4 Code: Description: ICD-10 Diagnosis Description [id number] Non-pressure chronic ulcer of left ankle with fat layer exposed Modifier: Quantity: Electronic Signature(s) Signed: 09/05/2020 5:32:08 PM By: [name] PA-C Entered By: [name] on 09/05/2020 17:32:08

## 2020-09-12 ENCOUNTER — Other Ambulatory Visit: Payer: Self-pay | Admitting: Family Medicine

## 2020-09-12 ENCOUNTER — Other Ambulatory Visit: Payer: Self-pay

## 2020-09-12 DIAGNOSIS — M5459 Other low back pain: Secondary | ICD-10-CM | POA: Diagnosis not present

## 2020-09-12 DIAGNOSIS — M47817 Spondylosis without myelopathy or radiculopathy, lumbosacral region: Secondary | ICD-10-CM | POA: Diagnosis not present

## 2020-09-12 MED ORDER — GABAPENTIN 300 MG PO CAPS
ORAL_CAPSULE | ORAL | 5 refills | Status: DC
  Filled 2020-09-12 (×2): qty 90, 30d supply, fill #0

## 2020-09-12 NOTE — Telephone Encounter (Signed)
Refill request Gabapentin Last refill 06/25/20 #90/1 Last office visit 07/14/20 Please confirm directions

## 2020-09-12 NOTE — Telephone Encounter (Signed)
Done. Durene Cal. Thanks.

## 2020-09-15 ENCOUNTER — Encounter: Payer: Medicare Other | Admitting: Physician Assistant

## 2020-09-15 ENCOUNTER — Other Ambulatory Visit: Payer: Self-pay

## 2020-09-15 DIAGNOSIS — I872 Venous insufficiency (chronic) (peripheral): Secondary | ICD-10-CM | POA: Diagnosis not present

## 2020-09-15 DIAGNOSIS — L97322 Non-pressure chronic ulcer of left ankle with fat layer exposed: Secondary | ICD-10-CM | POA: Diagnosis not present

## 2020-09-15 NOTE — Progress Notes (Signed)
CARLENE, DYER ([id number]) Visit Report for 09/15/2020 Arrival Information Details Patient Name: [name], [name]. Date of Service: 09/15/2020 8:00 AM Medical Record Number: [id number] Patient Account Number: [id number] Date of Birth/Sex: 05/06/1940 (80 y.o. F) Treating RN: Carlene Coria Primary Care Shamel Germond: Elsie Stain Other Clinician: Referring Nichol Ator: Elsie Stain Treating Phyllip Claw/Extender: Skipper Cliche in Treatment: 3 Visit Information History Since Last Visit All ordered tests and consults were completed: No Patient Arrived: Ambulatory Added or deleted any medications: No Arrival Time: 08:01 Any new allergies or adverse reactions: No Accompanied By: husband Had a fall or experienced change in No Transfer Assistance: None activities of daily living that may affect Patient Identification Verified: Yes risk of falls: Secondary Verification Process Completed: Yes Signs or symptoms of abuse/neglect since last visito No Patient Requires Transmission-Based Precautions: No Hospitalized since last visit: No Patient Has Alerts: No Implantable device outside of the clinic excluding No cellular tissue based products placed in the center since last visit: Has Dressing in Place as Prescribed: Yes Pain Present Now: No Electronic Signature(s) Signed: 09/15/2020 1:47:40 PM By: Carlene Coria RN Entered By: Carlene Coria on 09/15/2020 08:07:00 Klutz, Katriel T. ([id number]) -------------------------------------------------------------------------------- Clinic Level of Care Assessment Details Patient Name: [name], [name] Date of Service: 09/15/2020 8:00 AM Medical Record Number: [id number] Patient Account Number: [id number] Date of Birth/Sex: Mar 12, 1940 (80 y.o. F) Treating RN: Carlene Coria Primary Care Pennie Vanblarcom: Elsie Stain Other Clinician: Referring Lorenda Grecco: Elsie Stain Treating Malaki Koury/Extender: Skipper Cliche in Treatment: 3 Clinic Level of Care  Assessment Items TOOL 1 Quantity Score '[]'$  - Use when EandM and Procedure is performed on INITIAL visit 0 ASSESSMENTS - Nursing Assessment / Reassessment '[]'$  - General Physical Exam (combine w/ comprehensive assessment (listed just below) when performed on new 0 pt. evals) '[]'$  - 0 Comprehensive Assessment (HX, ROS, Risk Assessments, Wounds Hx, etc.) ASSESSMENTS - Wound and Skin Assessment / Reassessment '[]'$  - Dermatologic / Skin Assessment (not related to wound area) 0 ASSESSMENTS - Ostomy and/or Continence Assessment and Care '[]'$  - Incontinence Assessment and Management 0 '[]'$  - 0 Ostomy Care Assessment and Management (repouching, etc.) PROCESS - Coordination of Care '[]'$  - Simple Patient / Family Education for ongoing care 0 '[]'$  - 0 Complex (extensive) Patient / Family Education for ongoing care '[]'$  - 0 Staff obtains Programmer, systems, Records, Test Results / Process Orders '[]'$  - 0 Staff telephones HHA, Nursing Homes / Clarify orders / etc '[]'$  - 0 Routine Transfer to another Facility (non-emergent condition) '[]'$  - 0 Routine Hospital Admission (non-emergent condition) '[]'$  - 0 New Admissions / Biomedical engineer / Ordering NPWT, Apligraf, etc. '[]'$  - 0 Emergency Hospital Admission (emergent condition) PROCESS - Special Needs '[]'$  - Pediatric / Minor Patient Management 0 '[]'$  - 0 Isolation Patient Management '[]'$  - 0 Hearing / Language / Visual special needs '[]'$  - 0 Assessment of Community assistance (transportation, D/C planning, etc.) '[]'$  - 0 Additional assistance / Altered mentation '[]'$  - 0 Support Surface(s) Assessment (bed, cushion, seat, etc.) INTERVENTIONS - Miscellaneous '[]'$  - External ear exam 0 '[]'$  - 0 Patient Transfer (multiple staff / Civil Service fast streamer / Similar devices) '[]'$  - 0 Simple Staple / Suture removal (25 or less) '[]'$  - 0 Complex Staple / Suture removal (26 or more) '[]'$  - 0 Hypo/Hyperglycemic Management (do not check if billed separately) '[]'$  - 0 Ankle / Brachial Index (ABI) - do not  check if billed separately Has the patient been seen at the hospital within the last three years: Yes Total Score: 0 Level  Of Care: ____ [name] ([id number]) Electronic Signature(s) Signed: 09/15/2020 1:47:40 PM By: Carlene Coria RN Entered By: Carlene Coria on 09/15/2020 08:39:20 Vierling, Sokha TMarland Kitchen ([id number]) -------------------------------------------------------------------------------- Encounter Discharge Information Details Patient Name: Kim Crumble, [name] Date of Service: 09/15/2020 8:00 AM Medical Record Number: [id number] Patient Account Number: [id number] Date of Birth/Sex: Jun 27, 1940 (80 y.o. F) Treating RN: Carlene Coria Primary Care Daviel Allegretto: Elsie Stain Other Clinician: Referring Tahiry Spicer: Elsie Stain Treating Kindall Swaby/Extender: Skipper Cliche in Treatment: 3 Encounter Discharge Information Items Post Procedure Vitals Discharge Condition: Stable Temperature (F): 98.7 Ambulatory Status: Ambulatory Pulse (bpm): 78 Discharge Destination: Home Respiratory Rate (breaths/min): 18 Transportation: Private Auto Blood Pressure (mmHg): 174/78 Accompanied By: self Schedule Follow-up Appointment: Yes Clinical Summary of Care: Patient Declined Electronic Signature(s) Signed: 09/15/2020 1:47:40 PM By: Carlene Coria RN Entered By: Carlene Coria on 09/15/2020 08:40:41 Hockenberry, Shavaughn T. ([id number]) -------------------------------------------------------------------------------- Lower Extremity Assessment Details Patient Name: [name], [name] Date of Service: 09/15/2020 8:00 AM Medical Record Number: [id number] Patient Account Number: [id number] Date of Birth/Sex: Sep 14, 1940 (80 y.o. F) Treating RN: Carlene Coria Primary Care Elam Ellis: Elsie Stain Other Clinician: Referring Shantil Vallejo: Elsie Stain Treating Yehuda Printup/Extender: Jeri Cos Weeks in Treatment: 3 Edema Assessment Assessed: [Left: No] [Right: No] Edema: [Left: Ye] [Right: s] Calf Left:  Right: Point of Measurement: 35 cm From Medial Instep 36 cm Ankle Left: Right: Point of Measurement: 10 cm From Medial Instep 22 cm Vascular Assessment Pulses: Dorsalis Pedis Palpable: [Left:Yes] Electronic Signature(s) Signed: 09/15/2020 1:47:40 PM By: Carlene Coria RN Entered By: Carlene Coria on 09/15/2020 08:09:32 Takemoto, Laren T. ([id number]) -------------------------------------------------------------------------------- Multi Wound Chart Details Patient Name: Kim Crumble, Asta T. Date of Service: 09/15/2020 8:00 AM Medical Record Number: [id number] Patient Account Number: [id number] Date of Birth/Sex: 12-09-1940 (80 y.o. F) Treating RN: Carlene Coria Primary Care Ely Ballen: Elsie Stain Other Clinician: Referring Cathryne Mancebo: Elsie Stain Treating Ilea Hilton/Extender: Skipper Cliche in Treatment: 3 Vital Signs Height(in): 68 Pulse(bpm): 78 Weight(lbs): 192 Blood Pressure(mmHg): 174/78 Body Mass Index(BMI): 29 Temperature(F): 98.7 Respiratory Rate(breaths/min): 18 Photos: [N/A:N/A] Wound Location: Left, Medial Ankle N/A N/A Wounding Event: Gradually Appeared N/A N/A Primary Etiology: Venous Leg Ulcer N/A N/A Comorbid History: Hypertension N/A N/A Date Acquired: 08/13/2020 N/A N/A Weeks of Treatment: 3 N/A N/A Wound Status: Open N/A N/A Measurements L x W x D (cm) 0.1x0.1x0.1 N/A N/A Area (cm) : 0.008 N/A N/A Volume (cm) : 0.001 N/A N/A % Reduction in Area: 87.30% N/A N/A % Reduction in Volume: 92.30% N/A N/A Classification: Full Thickness Without Exposed N/A N/A Support Structures Exudate Amount: None Present N/A N/A Granulation Amount: None Present (0%) N/A N/A Necrotic Amount: Large (67-100%) N/A N/A Necrotic Tissue: Eschar N/A N/A Exposed Structures: Fascia: No N/A N/A Fat Layer (Subcutaneous Tissue): No Tendon: No Muscle: No Joint: No Bone: No Epithelialization: Large (67-100%) N/A N/A Treatment Notes Electronic Signature(s) Signed: 09/15/2020  1:47:40 PM By: Carlene Coria RN Entered By: Carlene Coria on 09/15/2020 08:35:43 Guilfoil, Katerina TMarland Kitchen ([id number]) -------------------------------------------------------------------------------- Multi-Disciplinary Care Plan Details Patient Name: [name] Date of Service: 09/15/2020 8:00 AM Medical Record Number: [id number] Patient Account Number: [id number] Date of Birth/Sex: 1940/08/21 (80 y.o. F) Treating RN: Carlene Coria Primary Care Zacherie Honeyman: Elsie Stain Other Clinician: Referring Christan Defranco: Elsie Stain Treating Winry Egnew/Extender: Skipper Cliche in Treatment: 3 Active Inactive Abuse / Safety / Falls / Self Care Management Nursing Diagnoses: Potential for falls Goals: Patient will remain injury free related to falls Date Initiated: 08/22/2020 Target Resolution Date: 09/22/2020 Goal Status: Active Interventions: Assess Activities of Daily Living upon  admission and as needed Assess fall risk on admission and as needed Assess: immobility, friction, shearing, incontinence upon admission and as needed Assess impairment of mobility on admission and as needed per policy Assess personal safety and home safety (as indicated) on admission and as needed Assess self care needs on admission and as needed Notes: Wound/Skin Impairment Nursing Diagnoses: Knowledge deficit related to ulceration/compromised skin integrity Goals: Patient/caregiver will verbalize understanding of skin care regimen Date Initiated: 08/22/2020 Target Resolution Date: 09/22/2020 Goal Status: Active Ulcer/skin breakdown will have a volume reduction of 30% by week 4 Date Initiated: 08/22/2020 Target Resolution Date: 09/22/2020 Goal Status: Active Ulcer/skin breakdown will have a volume reduction of 50% by week 8 Date Initiated: 08/22/2020 Target Resolution Date: 10/22/2020 Goal Status: Active Ulcer/skin breakdown will have a volume reduction of 80% by week 12 Date Initiated: 08/22/2020 Target Resolution Date:  11/22/2020 Goal Status: Active Ulcer/skin breakdown will heal within 14 weeks Date Initiated: 08/22/2020 Target Resolution Date: 12/22/2020 Goal Status: Active Interventions: Assess patient/caregiver ability to obtain necessary supplies Assess patient/caregiver ability to perform ulcer/skin care regimen upon admission and as needed Assess ulceration(s) every visit Notes: Electronic Signature(s) Signed: 09/15/2020 1:47:40 PM By: Carlene Coria RN Entered By: Carlene Coria on 09/15/2020 08:35:31 Procell, Jazlen T. ([id number]) Deckard, Larri T. ([id number]) -------------------------------------------------------------------------------- Pain Assessment Details Patient Name: Kim Crumble, Saddie T. Date of Service: 09/15/2020 8:00 AM Medical Record Number: [id number] Patient Account Number: [id number] Date of Birth/Sex: 1940-10-21 (80 y.o. F) Treating RN: Carlene Coria Primary Care Jakyia Gaccione: Elsie Stain Other Clinician: Referring Zakaiya Lares: Elsie Stain Treating Shenoa Hattabaugh/Extender: Skipper Cliche in Treatment: 3 Active Problems Location of Pain Severity and Description of Pain Patient Has Paino No Site Locations Pain Management and Medication Current Pain Management: Electronic Signature(s) Signed: 09/15/2020 1:47:40 PM By: Carlene Coria RN Entered By: Carlene Coria on 09/15/2020 08:07:56 Remund, Cortlyn TMarland Kitchen ([id number]) -------------------------------------------------------------------------------- Patient/Caregiver Education Details Patient Name: [name] Date of Service: 09/15/2020 8:00 AM Medical Record Number: [id number] Patient Account Number: [id number] Date of Birth/Gender: Nov 10, 1940 (80 y.o. F) Treating RN: Carlene Coria Primary Care Physician: Elsie Stain Other Clinician: Referring Physician: Elsie Stain Treating Physician/Extender: Skipper Cliche in Treatment: 3 Education Assessment Education Provided To: Patient Education Topics Provided Wound/Skin  Impairment: Methods: Explain/Verbal Responses: State content correctly Electronic Signature(s) Signed: 09/15/2020 1:47:40 PM By: Carlene Coria RN Entered By: Carlene Coria on 09/15/2020 08:39:33 Sakata, Annesha T. ([id number]) -------------------------------------------------------------------------------- Wound Assessment Details Patient Name: Beumer, Phillip T. Date of Service: 09/15/2020 8:00 AM Medical Record Number: [id number] Patient Account Number: [id number] Date of Birth/Sex: February 04, 1940 (80 y.o. F) Treating RN: Carlene Coria Primary Care Symphani Eckstrom: Elsie Stain Other Clinician: Referring Romell Cavanah: Elsie Stain Treating Berman Grainger/Extender: Jeri Cos Weeks in Treatment: 3 Wound Status Wound Number: 3 Primary Etiology: Venous Leg Ulcer Wound Location: Left, Medial Ankle Wound Status: Open Wounding Event: Gradually Appeared Comorbid History: Hypertension Date Acquired: 08/13/2020 Weeks Of Treatment: 3 Clustered Wound: No Photos Wound Measurements Length: (cm) 0.1 Width: (cm) 0.1 Depth: (cm) 0.1 Area: (cm) 0.008 Volume: (cm) 0.001 % Reduction in Area: 87.3% % Reduction in Volume: 92.3% Epithelialization: Large (67-100%) Tunneling: No Undermining: No Wound Description Classification: Full Thickness Without Exposed Support Structures Exudate Amount: None Present Foul Odor After Cleansing: No Slough/Fibrino Yes Wound Bed Granulation Amount: None Present (0%) Exposed Structure Necrotic Amount: Large (67-100%) Fascia Exposed: No Necrotic Quality: Eschar Fat Layer (Subcutaneous Tissue) Exposed: No Tendon Exposed: No Muscle Exposed: No Joint Exposed: No Bone Exposed: No Treatment Notes Wound #3 (Ankle) Wound Laterality: Left, Medial Cleanser  Normal Saline Discharge Instruction: Wash your hands with soap and water. Remove old dressing, discard into plastic bag and place into trash. Cleanse the wound with Normal Saline prior to applying a clean dressing using gauze  sponges, not tissues or cotton balls. Do not scrub or use excessive force. Pat dry using gauze sponges, not tissue or cotton balls. Soap and [address] SOMYA, STEINBRINK ([id number]) Discharge Instruction: Gently cleanse wound with antibacterial soap, rinse and pat dry prior to dressing wounds Peri-Wound Care Topical Primary Dressing Xeroform 5x9-HBD (in/in) Discharge Instruction: Apply Xeroform 5x9-HBD (in/in) as directed Secondary Dressing Bordered Gauze Sterile-HBD 4x4 (in/in) Discharge Instruction: Cover wound with Bordered Guaze Sterile as directed Gauze Discharge Instruction: As directed: dry, moistened with saline or moistened with Dakins Solution Secured With Compression Wrap Compression Stockings Add-Ons Electronic Signature(s) Signed: 09/15/2020 1:47:40 PM By: Carlene Coria RN Entered By: Carlene Coria on 09/15/2020 08:08:38 Boman, Taya T. ([id number]) -------------------------------------------------------------------------------- [place] Details Patient Name: Kim Crumble, Akila T. Date of Service: 09/15/2020 8:00 AM Medical Record Number: [id number] Patient Account Number: [id number] Date of Birth/Sex: 02-Oct-1940 (80 y.o. F) Treating RN: Carlene Coria Primary Care Zakiah Gauthreaux: Elsie Stain Other Clinician: Referring Sherrol Vicars: Elsie Stain Treating Novalie Leamy/Extender: Skipper Cliche in Treatment: 3 Vital Signs Time Taken: 08:07 Temperature (F): 98.7 Height (in): 68 Pulse (bpm): 78 Weight (lbs): 192 Respiratory Rate (breaths/min): 18 Body Mass Index (BMI): 29.2 Blood Pressure (mmHg): 174/78 Reference Range: 80 - 120 mg / dl Electronic Signature(s) Signed: 09/15/2020 1:47:40 PM By: Carlene Coria RN Entered By: Carlene Coria on 09/15/2020 08:07:47

## 2020-09-16 NOTE — Progress Notes (Addendum)
TOSHEBA, KEYLON ([id number]) Visit Report for 09/15/2020 Chief Complaint Document Details Patient Name: [name], [name] Date of Service: 09/15/2020 8:00 AM Medical Record Number: [id number] Patient Account Number: [id number] Date of Birth/Sex: 09/30/1940 (79 y.o. F) Treating RN: Carlene Coria Primary Care Provider: Elsie Stain Other Clinician: Referring Provider: Elsie Stain Treating Provider/Extender: Skipper Cliche in Treatment: 3 Information Obtained from: Patient Chief Complaint Patient presents for treatment of an open ulcer due to venous insufficiency Situated at her right medial ankle for about 3 weeks Electronic Signature(s) Signed: 09/15/2020 8:24:31 AM By: [name] PA-C Entered By: [name] on 09/15/2020 08:24:31 Spangler, Driana T. ([id number]) -------------------------------------------------------------------------------- Debridement Details Patient Name: [name], [name] Date of Service: 09/15/2020 8:00 AM Medical Record Number: [id number] Patient Account Number: [id number] Date of Birth/Sex: [birth year] (79 y.o. F) Treating RN: Carlene Coria Primary Care Provider: Elsie Stain Other Clinician: Referring Provider: Elsie Stain Treating Provider/Extender: Skipper Cliche in Treatment: 3 Debridement Performed for Wound #3 Left,Medial Ankle Assessment: Performed By: Physician Tommie Sams., PA-C Debridement Type: Debridement Severity of Tissue Pre Debridement: Fat layer exposed Level of Consciousness (Pre- Awake and Alert procedure): Pre-procedure Verification/Time Out Yes - 08:35 Taken: Start Time: 08:35 Pain Control: Lidocaine 4% Topical Solution Total Area Debrided (L x W): 0.5 (cm) x 0.5 (cm) = 0.25 (cm) Tissue and other material Non-Viable, Subcutaneous, Skin: Dermis , Skin: Epidermis debrided: Level: Skin/Subcutaneous Tissue Debridement Description: Excisional Instrument: Curette Bleeding: Minimum Hemostasis Achieved:  Pressure End Time: 08:38 Procedural Pain: 0 Post Procedural Pain: 0 Response to Treatment: Procedure was tolerated well Level of Consciousness (Post- Awake and Alert procedure): Post Debridement Measurements of Total Wound Length: (cm) 0.1 Width: (cm) 0.1 Depth: (cm) 0.1 Volume: (cm) 0.001 Character of Wound/Ulcer Post Debridement: Improved Severity of Tissue Post Debridement: Fat layer exposed Post Procedure Diagnosis Same as Pre-procedure Electronic Signature(s) Signed: 09/15/2020 1:47:40 PM By: Carlene Coria RN Signed: 09/15/2020 5:05:16 PM By: [name] PA-C Entered By: Carlene Coria on 09/15/2020 08:38:34 [name], [name] ([id number]) -------------------------------------------------------------------------------- HPI Details Patient Name: [name], [name] Date of Service: 09/15/2020 8:00 AM Medical Record Number: [id number] Patient Account Number: [id number] Date of Birth/Sex: 11/09/1940 (79 y.o. F) Treating RN: Carlene Coria Primary Care Provider: Elsie Stain Other Clinician: Referring Provider: Elsie Stain Treating Provider/Extender: Skipper Cliche in Treatment: 3 History of Present Illness HPI Description: 80 year old patient was recently seen by her PCP [name] and he noted erythema and ulceration and started her on doxycycline. He noted a intact dorsalis pedis pulse.the medial right ankle had skin ulceration which had been treated by Keflex recently and notices made that she had had wound care treatments earlier. past medical history significant for back pain, endometriosis, high cholesterol, hypothyroidism, migraine and venous stasis ulcer treated at [facility] she has never been a smoker the patient gives a history of right varicose vein stripping done in the year 2000 at [facility]. She has not been wearing her compression since. 11/05/2016 -- the venous duplex study has been done for reflux and we are awaiting her arterial study to be done.  The patient is of a very nervous disposition and seems to be very anxious. 11/15/2016 -- venous duplex reflux study -- venous incompetence noted in the right saphenofemoral junction and the right great saphenous vein on the right lower extremity. The right great saphenous vein was harvested as mentioned above. Arterial study was done on 11/12/2016 but the results are not yet available. I will refer her to  see the vascular surgeons for an opinion. 11/22/2016 -- lower extremity arterial duplex examination done showed no hemodynamically significant stenosis in bilateral arterial flow examination. As far as a venous examination goes she had declined to get her left leg studied and the right leg is also noted on 11/15/2016. On asking the patient about this she says she never told the technician not to do her left leg but it may have been a misunderstanding. The patient will be seeing the vascular surgeons soon and I have asked them to address whether the left leg needs a venous duplex study. 12/13/2016 -- was seen by Dr. Leotis Pain on 11/30/2016, who reviewed her recent venous duplex study,a noninvasive arterial study, and after a thorough review has recommended to continue with compression stockings daily and if the venous ulceration recurs then he would consider her for form sclerotherapy of the incompetent accessory saphenous vein. 12/31/2016 -- the patient was recently discharged but comes back with a small open area on her medial foot which is in the region of previous varicosities which are under the care of Dr. Corene Cornea dew. The patient has been very compliant with wearing her compression stockings. 01/14/2017 -- she has done very well and her wounds are now healed. She has still not gotten an appointment to see Dr. Lucky Cowboy and I have urged her to keep up with this Readmission: 08/22/2020 upon evaluation today patient presents for follow-up concerning issues that she has been having with wounds on her  left medial ankle. She tells me that she was actually working outside of roughly a week ago on 08/13/2020 when she does not remember any particular injury but nonetheless ended up with having significant blood in her compression sock when she came back inside. This scared her and they almost went to the hospital but opted not to due to the fact that she was able to get the bleeding stopped. Nonetheless she continued to have issues here with some pain and drainage from the area and though it does not appear to be actively bleeding at this time I still see evidence of an open wound. She does have multiple spider veins noted in and around this area and it was actually from 1 of these that the bleeding began. She has not seen anyone about this and potentially there could be a possibility that Dr. Otho Ket someone could possibly see about doing something to collapse these and prevent this type of thing from going on again. Obviously this would not be cosmetic but now true medical issue since she is already had 1 instance of rupture and spontaneous bleeding despite any specific injury. I discussed this with her today she is definitely interested in checking into this, and I will possibly see about contacting the office of Dr. Doing Dr. Delana Meyer to see if this is something they do or if I would need to refer her to [facility]. Fortunately the patient does not have any significant major medical problems that would affect the healing other than the chronic venous insufficiency. 08/27/2020 upon evaluation today patient's wound actually showing signs of excellent improvement which is great news. Fortunately there does not appear to be any signs of active infection which is also excellent news. In general I think that she is making great progress. 09/05/2020 upon evaluation today patient presents for reevaluation in regard to her ankle ulcer. This actually is showing signs of improvement and again is very small and very  pleased with what I see today. She has done  well with her compression sock at home using diet and the silver alginate along with a border foam to cover. In general I think she is very close to complete resolution. 09/15/2020 upon evaluation today patient appears to be doing better in regard to her wound. This is actually extremely small and I think is very close to complete resolution. With that being said there is a little bit of dried drainage and eschar over the surface of the wound that I did have to remove away today. She tolerated that without any complication which is great news. Electronic Signature(s) Signed: 09/15/2020 9:57:29 AM By: [name] PA-C Previous Signature: 09/15/2020 8:29:14 AM Version By: [name] PA-C Entered By: [name] on 09/15/2020 09:57:29 [name], [name] ([id number]) -------------------------------------------------------------------------------- Physical Exam Details Patient Name: Makarewicz, Tambi T. Date of Service: 09/15/2020 8:00 AM Medical Record Number: [id number] Patient Account Number: [id number] Date of Birth/Sex: 01-25-1940 (79 y.o. F) Treating RN: Carlene Coria Primary Care Provider: Elsie Stain Other Clinician: Referring Provider: Elsie Stain Treating Provider/Extender: Skipper Cliche in Treatment: 3 Constitutional Well-nourished and well-hydrated in no acute distress. Respiratory normal breathing without difficulty. Neurological cranial nerves 2-12 intact. Patient has normal sensation in the feet bilaterally to light touch. Notes Upon inspection patient's wound bed actually showed signs of good granulation in a lot of areas. She did have eschar covering this and so once I remove the eschar it became apparent that this wound was still open. With that being said there is no evidence of infection at this time. Electronic Signature(s) Signed: 09/15/2020 10:10:21 AM By: [name] PA-C Entered By: [name] on  09/15/2020 10:10:21 [name] ([id number]) -------------------------------------------------------------------------------- Physician Orders Details Patient Name: [name], Joycelynn T. Date of Service: 09/15/2020 8:00 AM Medical Record Number: [id number] Patient Account Number: [id number] Date of Birth/Sex: 04/06/1940 (79 y.o. F) Treating RN: Carlene Coria Primary Care Provider: Elsie Stain Other Clinician: Referring Provider: Elsie Stain Treating Provider/Extender: Skipper Cliche in Treatment: 3 Verbal / Phone Orders: No Diagnosis Coding ICD-10 Coding Code Description I87.2 Venous insufficiency (chronic) (peripheral) L97.322 Non-pressure chronic ulcer of left ankle with fat layer exposed Follow-up Appointments o Return Appointment in 1 week. Edema Control - Lymphedema / Segmental Compressive Device / Other o Elevate, Exercise Daily and Avoid Standing for Long Periods of Time. o Elevate legs to the level of the heart and pump ankles as often as possible o Elevate leg(s) parallel to the floor when sitting. Wound Treatment Wound #3 - Ankle Wound Laterality: Left, Medial Cleanser: Normal Saline 3 x Per Week Discharge Instructions: Wash your hands with soap and water. Remove old dressing, discard into plastic bag and place into trash. Cleanse the wound with Normal Saline prior to applying a clean dressing using gauze sponges, not tissues or cotton balls. Do not scrub or use excessive force. Pat dry using gauze sponges, not tissue or cotton balls. Cleanser: Soap and Water 3 x Per Week Discharge Instructions: Gently cleanse wound with antibacterial soap, rinse and pat dry prior to dressing wounds Primary Dressing: Xeroform 5x9-HBD (in/in) 3 x Per Week Discharge Instructions: Apply Xeroform 5x9-HBD (in/in) as directed Secondary Dressing: Bordered Gauze Sterile-HBD 4x4 (in/in) 3 x Per Week Discharge Instructions: Cover wound with Bordered Guaze Sterile as  directed Secondary Dressing: Gauze 3 x Per Week Discharge Instructions: As directed: dry, moistened with saline or moistened with Dakins Solution Electronic Signature(s) Signed: 09/15/2020 1:47:40 PM By: Carlene Coria RN Signed: 09/15/2020 5:05:16 PM By: [name]  PA-C Entered By: Carlene Coria on 09/15/2020 08:39:13 [name], [name] ([id number]) -------------------------------------------------------------------------------- Problem List Details Patient Name: Lezama, Katurah T. Date of Service: 09/15/2020 8:00 AM Medical Record Number: [id number] Patient Account Number: [id number] Date of Birth/Sex: 01-30-1940 (79 y.o. F) Treating RN: Carlene Coria Primary Care Provider: Elsie Stain Other Clinician: Referring Provider: Elsie Stain Treating Provider/Extender: Skipper Cliche in Treatment: 3 Active Problems ICD-10 Encounter Code Description Active Date MDM Diagnosis I87.2 Venous insufficiency (chronic) (peripheral) 08/22/2020 No Yes L97.322 Non-pressure chronic ulcer of left ankle with fat layer exposed 08/22/2020 No Yes Inactive Problems Resolved Problems Electronic Signature(s) Signed: 09/15/2020 8:24:13 AM By: [name] PA-C Entered By: [name] on 09/15/2020 08:24:13 [name], [name] ([id number]) -------------------------------------------------------------------------------- Progress Note Details Patient Name: [name], [name] Date of Service: 09/15/2020 8:00 AM Medical Record Number: [id number] Patient Account Number: [id number] Date of Birth/Sex: 1940/07/31 (79 y.o. F) Treating RN: Carlene Coria Primary Care Provider: Elsie Stain Other Clinician: Referring Provider: Elsie Stain Treating Provider/Extender: Skipper Cliche in Treatment: 3 Subjective Chief Complaint Information obtained from Patient Patient presents for treatment of an open ulcer due to venous insufficiency Situated at her right medial ankle for about 3 weeks History of Present  Illness (HPI) 80 year old patient was recently seen by her PCP [name] and he noted erythema and ulceration and started her on doxycycline. He noted a intact dorsalis pedis pulse.the medial right ankle had skin ulceration which had been treated by Keflex recently and notices made that she had had wound care treatments earlier. past medical history significant for back pain, endometriosis, high cholesterol, hypothyroidism, migraine and venous stasis ulcer treated at [facility]. she has never been a smoker the patient gives a history of right varicose vein stripping done in the year 2000 at [facility]. She has not been wearing her compression since. 11/05/2016 -- the venous duplex study has been done for reflux and we are awaiting her arterial study to be done. The patient is of a very nervous disposition and seems to be very anxious. 11/15/2016 -- venous duplex reflux study -- venous incompetence noted in the right saphenofemoral junction and the right great saphenous vein on the right lower extremity. The right great saphenous vein was harvested as mentioned above. Arterial study was done on 11/12/2016 but the results are not yet available. I will refer her to see the vascular surgeons for an opinion. 11/22/2016 -- lower extremity arterial duplex examination done showed no hemodynamically significant stenosis in bilateral arterial flow examination. As far as a venous examination goes she had declined to get her left leg studied and the right leg is also noted on 11/15/2016. On asking the patient about this she says she never told the technician not to do her left leg but it may have been a misunderstanding. The patient will be seeing the vascular surgeons soon and I have asked them to address whether the left leg needs a venous duplex study. 12/13/2016 -- was seen by Dr. Leotis Pain on 11/30/2016, who reviewed her recent venous duplex study,a noninvasive arterial study, and after  a thorough review has recommended to continue with compression stockings daily and if the venous ulceration recurs then he would consider her for form sclerotherapy of the incompetent accessory saphenous vein. 12/31/2016 -- the patient was recently discharged but comes back with a small open area on her medial foot which is in the region of previous varicosities which are under the care of Dr. Corene Cornea dew. The patient has  been very compliant with wearing her compression stockings. 01/14/2017 -- she has done very well and her wounds are now healed. She has still not gotten an appointment to see Dr. Lucky Cowboy and I have urged her to keep up with this Readmission: 08/22/2020 upon evaluation today patient presents for follow-up concerning issues that she has been having with wounds on her left medial ankle. She tells me that she was actually working outside of roughly a week ago on 08/13/2020 when she does not remember any particular injury but nonetheless ended up with having significant blood in her compression sock when she came back inside. This scared her and they almost went to the hospital but opted not to due to the fact that she was able to get the bleeding stopped. Nonetheless she continued to have issues here with some pain and drainage from the area and though it does not appear to be actively bleeding at this time I still see evidence of an open wound. She does have multiple spider veins noted in and around this area and it was actually from 1 of these that the bleeding began. She has not seen anyone about this and potentially there could be a possibility that Dr. Otho Ket someone could possibly see about doing something to collapse these and prevent this type of thing from going on again. Obviously this would not be cosmetic but now true medical issue since she is already had 1 instance of rupture and spontaneous bleeding despite any specific injury. I discussed this with her today she is definitely  interested in checking into this, and I will possibly see about contacting the office of Dr. Doing Dr. Delana Meyer to see if this is something they do or if I would need to refer her to [facility]. Fortunately the patient does not have any significant major medical problems that would affect the healing other than the chronic venous insufficiency. 08/27/2020 upon evaluation today patient's wound actually showing signs of excellent improvement which is great news. Fortunately there does not appear to be any signs of active infection which is also excellent news. In general I think that she is making great progress. 09/05/2020 upon evaluation today patient presents for reevaluation in regard to her ankle ulcer. This actually is showing signs of improvement and again is very small and very pleased with what I see today. She has done well with her compression sock at home using diet and the silver alginate along with a border foam to cover. In general I think she is very close to complete resolution. 09/15/2020 upon evaluation today patient appears to be doing better in regard to her wound. This is actually extremely small and I think is very close to complete resolution. With that being said there is a little bit of dried drainage and eschar over the surface of the wound that I did have to remove away today. She tolerated that without any complication which is great news. [name], [name] ([id number]) Objective Constitutional Well-nourished and well-hydrated in no acute distress. Vitals Time Taken: 8:07 AM, Height: 68 in, Weight: 192 lbs, BMI: 29.2, Temperature: 98.7 F, Pulse: 78 bpm, Respiratory Rate: 18 breaths/min, Blood Pressure: 174/78 mmHg. Respiratory normal breathing without difficulty. Neurological cranial nerves 2-12 intact. Patient has normal sensation in the feet bilaterally to light touch. General Notes: Upon inspection patient's wound bed actually showed signs of good granulation in a lot of  areas. She did have eschar covering this and so once I remove the eschar it became apparent  that this wound was still open. With that being said there is no evidence of infection at this time. Integumentary (Hair, Skin) Wound #3 status is Open. Original cause of wound was Gradually Appeared. The date acquired was: 08/13/2020. The wound has been in treatment 3 weeks. The wound is located on the Left,Medial Ankle. The wound measures 0.1cm length x 0.1cm width x 0.1cm depth; 0.008cm^2 area and 0.001cm^3 volume. There is no tunneling or undermining noted. There is a none present amount of drainage noted. There is no granulation within the wound bed. There is a large (67-100%) amount of necrotic tissue within the wound bed including Eschar. Assessment Active Problems ICD-10 Venous insufficiency (chronic) (peripheral) Non-pressure chronic ulcer of left ankle with fat layer exposed Procedures Wound #3 Pre-procedure diagnosis of Wound #3 is a Venous Leg Ulcer located on the Left,Medial Ankle .Severity of Tissue Pre Debridement is: Fat layer exposed. There was a Excisional Skin/Subcutaneous Tissue Debridement with a total area of 0.25 sq cm performed by Tommie Sams., PA-C. With the following instrument(s): Curette to remove Non-Viable tissue/material. Material removed includes Subcutaneous Tissue, Skin: Dermis, and Skin: Epidermis after achieving pain control using Lidocaine 4% Topical Solution. No specimens were taken. A time out was conducted at 08:35, prior to the start of the procedure. A Minimum amount of bleeding was controlled with Pressure. The procedure was tolerated well with a pain level of 0 throughout and a pain level of 0 following the procedure. Post Debridement Measurements: 0.1cm length x 0.1cm width x 0.1cm depth; 0.001cm^3 volume. Character of Wound/Ulcer Post Debridement is improved. Severity of Tissue Post Debridement is: Fat layer exposed. Post procedure Diagnosis Wound #3: Same  as Pre-Procedure Plan Follow-up Appointments: Return Appointment in 1 week. Edema Control - Lymphedema / Segmental Compressive Device / Other: Elevate, Exercise Daily and Avoid Standing for Long Periods of Time. Elevate legs to the level of the heart and pump ankles as often as possible Elevate leg(s) parallel to the floor when sitting. WOUND #3: - Ankle Wound Laterality: Left, Medial Cleanser: Normal Saline 3 x Per Week/ Discharge Instructions: Wash your hands with soap and water. Remove old dressing, discard into plastic bag and place into trash. [name], [name] ([id number]) Cleanse the wound with Normal Saline prior to applying a clean dressing using gauze sponges, not tissues or cotton balls. Do not scrub or use excessive force. Pat dry using gauze sponges, not tissue or cotton balls. Cleanser: Soap and Water 3 x Per Week/ Discharge Instructions: Gently cleanse wound with antibacterial soap, rinse and pat dry prior to dressing wounds Primary Dressing: Xeroform 5x9-HBD (in/in) 3 x Per Week/ Discharge Instructions: Apply Xeroform 5x9-HBD (in/in) as directed Secondary Dressing: Bordered Gauze Sterile-HBD 4x4 (in/in) 3 x Per Week/ Discharge Instructions: Cover wound with Bordered Guaze Sterile as directed Secondary Dressing: Gauze 3 x Per Week/ Discharge Instructions: As directed: dry, moistened with saline or moistened with Dakins Solution 1. Based on the fact this is as dry as it is I would recommend that we switch to a Xeroform gauze dressing which I think will do well for her. 2. I am also can recommend that we continue with the border gauze dressing or large Band-Aid with a gauze bolster behind it this is doing a great job at keeping the medicine right up against the wound and this little crevice of her ankle. We will see patient back for reevaluation in 1 week here in the clinic. If anything worsens or changes patient will contact our office  for additional recommendations. I fully  expect this to likely be healed next week. Electronic Signature(s) Signed: 09/15/2020 10:11:11 AM By: [name] PA-C Entered By: [name] on 09/15/2020 10:11:11 [name], [name] ([id number]) -------------------------------------------------------------------------------- SuperBill Details Patient Name: [name] Date of Service: 09/15/2020 Medical Record Number: [id number] Patient Account Number: [id number] Date of Birth/Sex: [birth year] (80 y.o. 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

## 2020-09-23 ENCOUNTER — Other Ambulatory Visit: Payer: Self-pay

## 2020-09-23 ENCOUNTER — Encounter: Payer: Medicare Other | Attending: Physician Assistant | Admitting: Physician Assistant

## 2020-09-23 DIAGNOSIS — L97322 Non-pressure chronic ulcer of left ankle with fat layer exposed: Secondary | ICD-10-CM | POA: Insufficient documentation

## 2020-09-23 DIAGNOSIS — I872 Venous insufficiency (chronic) (peripheral): Secondary | ICD-10-CM | POA: Diagnosis not present

## 2020-09-24 ENCOUNTER — Other Ambulatory Visit: Payer: Self-pay | Admitting: Family Medicine

## 2020-09-25 NOTE — Telephone Encounter (Signed)
Last refilled on 09/07/19 #180 with 1 refill LOV 07/31/20 cough, Next appointment 10/16/20 CPE

## 2020-09-26 NOTE — Telephone Encounter (Signed)
Sent. Thanks.   

## 2020-09-28 ENCOUNTER — Other Ambulatory Visit: Payer: Self-pay | Admitting: Family Medicine

## 2020-09-28 DIAGNOSIS — E039 Hypothyroidism, unspecified: Secondary | ICD-10-CM

## 2020-09-28 DIAGNOSIS — G2581 Restless legs syndrome: Secondary | ICD-10-CM

## 2020-09-28 DIAGNOSIS — E559 Vitamin D deficiency, unspecified: Secondary | ICD-10-CM

## 2020-09-29 ENCOUNTER — Ambulatory Visit: Payer: Medicare Other | Admitting: Physician Assistant

## 2020-10-01 ENCOUNTER — Other Ambulatory Visit: Payer: Self-pay

## 2020-10-01 NOTE — Progress Notes (Signed)
LORILEI, BUFORD ([id number]) Visit Report for 09/23/2020 Arrival Information Details Patient Name: [name], [name]. Date of Service: 09/23/2020 8:00 AM Medical Record Number: [id number] Patient Account Number: [id number] Date of Birth/Sex: March 27, 1940 (79 y.o. F) Treating RN: Carlene Coria Primary Care Alayia Meggison: Elsie Stain Other Clinician: Referring Latina Frank: Elsie Stain Treating Rayneisha Bouza/Extender: Skipper Cliche in Treatment: 4 Visit Information History Since Last Visit All ordered tests and consults were completed: No Patient Arrived: Ambulatory Added or deleted any medications: No Arrival Time: 08:07 Any new allergies or adverse reactions: No Accompanied By: husband Had a fall or experienced change in No Transfer Assistance: None activities of daily living that may affect Patient Identification Verified: Yes risk of falls: Secondary Verification Process Completed: Yes Signs or symptoms of abuse/neglect since last visito No Patient Requires Transmission-Based Precautions: No Hospitalized since last visit: No Patient Has Alerts: No Implantable device outside of the clinic excluding No cellular tissue based products placed in the center since last visit: Has Dressing in Place as Prescribed: Yes Pain Present Now: No Electronic Signature(s) Signed: 10/01/2020 7:54:52 AM By: Carlene Coria RN Entered By: Carlene Coria on 09/23/2020 08:10:18 Battey, Lillymae TMarland Kitchen ([id number]) -------------------------------------------------------------------------------- Clinic Level of Care Assessment Details Patient Name: [name], Kimra T. Date of Service: 09/23/2020 8:00 AM Medical Record Number: [id number] Patient Account Number: [id number] Date of Birth/Sex: 23-Jun-1940 (79 y.o. F) Treating RN: Carlene Coria Primary Care Brittain Smithey: Elsie Stain Other Clinician: Referring Dalbert Stillings: Elsie Stain Treating Daniela Siebers/Extender: Skipper Cliche in Treatment: 4 Clinic Level of Care Assessment  Items TOOL 4 Quantity Score X - Use when only an EandM is performed on FOLLOW-UP visit 1 0 ASSESSMENTS - Nursing Assessment / Reassessment X - Reassessment of Co-morbidities (includes updates in patient status) 1 10 X- 1 5 Reassessment of Adherence to Treatment Plan ASSESSMENTS - Wound and Skin Assessment / Reassessment X - Simple Wound Assessment / Reassessment - one wound 1 5 '[]'$  - 0 Complex Wound Assessment / Reassessment - multiple wounds '[]'$  - 0 Dermatologic / Skin Assessment (not related to wound area) ASSESSMENTS - Focused Assessment '[]'$  - Circumferential Edema Measurements - multi extremities 0 '[]'$  - 0 Nutritional Assessment / Counseling / Intervention '[]'$  - 0 Lower Extremity Assessment (monofilament, tuning fork, pulses) '[]'$  - 0 Peripheral Arterial Disease Assessment (using hand held doppler) ASSESSMENTS - Ostomy and/or Continence Assessment and Care '[]'$  - Incontinence Assessment and Management 0 '[]'$  - 0 Ostomy Care Assessment and Management (repouching, etc.) PROCESS - Coordination of Care X - Simple Patient / Family Education for ongoing care 1 15 '[]'$  - 0 Complex (extensive) Patient / Family Education for ongoing care '[]'$  - 0 Staff obtains Programmer, systems, Records, Test Results / Process Orders '[]'$  - 0 Staff telephones HHA, Nursing Homes / Clarify orders / etc '[]'$  - 0 Routine Transfer to another Facility (non-emergent condition) '[]'$  - 0 Routine Hospital Admission (non-emergent condition) '[]'$  - 0 New Admissions / Biomedical engineer / Ordering NPWT, Apligraf, etc. '[]'$  - 0 Emergency Hospital Admission (emergent condition) X- 1 10 Simple Discharge Coordination '[]'$  - 0 Complex (extensive) Discharge Coordination PROCESS - Special Needs '[]'$  - Pediatric / Minor Patient Management 0 '[]'$  - 0 Isolation Patient Management '[]'$  - 0 Hearing / Language / Visual special needs '[]'$  - 0 Assessment of Community assistance (transportation, D/C planning, etc.) '[]'$  - 0 Additional assistance /  Altered mentation '[]'$  - 0 Support Surface(s) Assessment (bed, cushion, seat, etc.) INTERVENTIONS - Wound Cleansing / Measurement Teuscher, Faigy T. ([id number]) X- 1 5 Simple Wound Cleansing -  one wound '[]'$  - 0 Complex Wound Cleansing - multiple wounds X- 1 5 Wound Imaging (photographs - any number of wounds) '[]'$  - 0 Wound Tracing (instead of photographs) X- 1 5 Simple Wound Measurement - one wound '[]'$  - 0 Complex Wound Measurement - multiple wounds INTERVENTIONS - Wound Dressings '[]'$  - Small Wound Dressing one or multiple wounds 0 '[]'$  - 0 Medium Wound Dressing one or multiple wounds '[]'$  - 0 Large Wound Dressing one or multiple wounds '[]'$  - 0 Application of Medications - topical '[]'$  - 0 Application of Medications - injection INTERVENTIONS - Miscellaneous '[]'$  - External ear exam 0 '[]'$  - 0 Specimen Collection (cultures, biopsies, blood, body fluids, etc.) '[]'$  - 0 Specimen(s) / Culture(s) sent or taken to Lab for analysis '[]'$  - 0 Patient Transfer (multiple staff / Civil Service fast streamer / Similar devices) '[]'$  - 0 Simple Staple / Suture removal (25 or less) '[]'$  - 0 Complex Staple / Suture removal (26 or more) '[]'$  - 0 Hypo / Hyperglycemic Management (close monitor of Blood Glucose) '[]'$  - 0 Ankle / Brachial Index (ABI) - do not check if billed separately X- 1 5 Vital Signs Has the patient been seen at the hospital within the last three years: Yes Total Score: 65 Level Of Care: New/Established - Level 2 Electronic Signature(s) Signed: 10/01/2020 7:54:52 AM By: Carlene Coria RN Entered By: Carlene Coria on 09/23/2020 08:43:28 Aufiero, Jamirah T. ([id number]) -------------------------------------------------------------------------------- Encounter Discharge Information Details Patient Name: [name], Turkessa T. Date of Service: 09/23/2020 8:00 AM Medical Record Number: [id number] Patient Account Number: [id number] Date of Birth/Sex: 1940/11/21 (79 y.o. F) Treating RN: Carlene Coria Primary Care Laney Bagshaw:  Elsie Stain Other Clinician: Referring Arisha Gervais: Elsie Stain Treating Braxson Hollingsworth/Extender: Skipper Cliche in Treatment: 4 Encounter Discharge Information Items Discharge Condition: Stable Ambulatory Status: Ambulatory Discharge Destination: Home Transportation: Private Auto Accompanied By: husband Schedule Follow-up Appointment: Yes Clinical Summary of Care: Patient Declined Electronic Signature(s) Signed: 10/01/2020 7:54:52 AM By: Carlene Coria RN Entered By: Carlene Coria on 09/23/2020 08:44:23 Neuberger, Chamille T. ([id number]) -------------------------------------------------------------------------------- Lower Extremity Assessment Details Patient Name: Amster, Aayat T. Date of Service: 09/23/2020 8:00 AM Medical Record Number: [id number] Patient Account Number: [id number] Date of Birth/Sex: [birth year] (79 y.o. F) Treating RN: Carlene Coria Primary Care Yannely Kintzel: Elsie Stain Other Clinician: Referring Lynne Takemoto: Elsie Stain Treating Allexus Ovens/Extender: Jeri Cos Weeks in Treatment: 4 Edema Assessment Assessed: [name]: No] [Right: No] [Left: Edema] [Right: :] Calf Left: Right: Point of Measurement: 35 cm From Medial Instep 37 cm Ankle Left: Right: Point of Measurement: 10 cm From Medial Instep 22 cm Vascular Assessment Pulses: Dorsalis Pedis Palpable: [Left:Yes] Electronic Signature(s) Signed: 10/01/2020 7:54:52 AM By: Carlene Coria RN Entered By: Carlene Coria on 09/23/2020 08:14:33 Utter, Lanell T. ([id number]) -------------------------------------------------------------------------------- Multi Wound Chart Details Patient Name: [name], Tsuyako T. Date of Service: 09/23/2020 8:00 AM Medical Record Number: [id number] Patient Account Number: [id number] Date of Birth/Sex: 09-08-1940 (79 y.o. 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] [N/A:N/A] Comorbid History: [3:Hypertension] [N/A:N/A] Date Acquired: [3:08/13/2020] [N/A:N/A] Weeks of Treatment: [3:4] [N/A:N/A] Wound Status: [3:Healed - Epithelialized] [N/A:N/A] Measurements L x W x D (cm) [3:0x0x0] [N/A:N/A] Area (cm) : [3:0] [N/A:N/A] Volume (cm) : [3:0] [  N/A:N/A] % Reduction in Area: [3:100.00%] [N/A:N/A] % Reduction in Volume: [3:100.00%] [N/A:N/A] Classification: [3:Full Thickness Without Exposed Support Structures] [N/A:N/A] Exudate Amount: [3:None Present] [N/A:N/A] Granulation Amount: [3:None Present (0%)] [N/A:N/A] Necrotic Amount: [3:None Present (0%)] [N/A:N/A] Exposed Structures: [3:Fascia: No Fat Layer (Subcutaneous Tissue): No Tendon: No Muscle: No Joint: No Bone: No Large (67-100%)] [N/A:N/A N/A] Treatment Notes Electronic Signature(s) Signed: 10/01/2020 7:54:52 AM By: Carlene Coria RN Entered By: Carlene Coria on 09/23/2020 08:42:01 Trolinger, Kimberleigh TMarland Kitchen ([id number]) -------------------------------------------------------------------------------- Dare Details Patient Name: [name] Date of Service: 09/23/2020 8:00 AM Medical Record Number: [id number] Patient Account Number: [id number] Date of Birth/Sex: Dec 19, 1940 (79 y.o. F) Treating RN: Carlene Coria Primary Care Karmon Andis: Elsie Stain Other Clinician: Referring Dara Beidleman: Elsie Stain Treating Breyanna Valera/Extender: Jeri Cos Weeks in Treatment: 4 Active Inactive Electronic Signature(s) Signed: 10/01/2020 7:54:52 AM By: Carlene Coria RN Entered By: Carlene Coria on 09/23/2020 08:41:53 Flury, Elonda T.  ([id number]) -------------------------------------------------------------------------------- Pain Assessment Details Patient Name: Costanza, Jamariah T. Date of Service: 09/23/2020 8:00 AM Medical Record Number: [id number] Patient Account Number: [id number] Date of Birth/Sex: 1941/01/07 (79 y.o. F) Treating RN: Carlene Coria Primary Care Karey Suthers: Elsie Stain Other Clinician: Referring Binnie Droessler: Elsie Stain Treating Judea Riches/Extender: Skipper Cliche in Treatment: 4 Active Problems Location of Pain Severity and Description of Pain Patient Has Paino No Site Locations Pain Management and Medication Current Pain Management: Electronic Signature(s) Signed: 10/01/2020 7:54:52 AM By: Carlene Coria RN Entered By: Carlene Coria on 09/23/2020 08:11:17 Parlato, Kaidyn Darene Lamer ([id number]) -------------------------------------------------------------------------------- Patient/Caregiver Education Details Patient Name: [name] Date of Service: 09/23/2020 8:00 AM Medical Record Number: [id number] Patient Account Number: [id number] Date of Birth/Gender: May 27, 1940 (79 y.o. F) Treating RN: Carlene Coria Primary Care Physician: Elsie Stain Other Clinician: Referring Physician: Elsie Stain Treating Physician/Extender: Skipper Cliche in Treatment: 4 Education Assessment Education Provided To: Patient Education Topics Provided Wound/Skin Impairment: Methods: Explain/Verbal Responses: State content correctly Electronic Signature(s) Signed: 10/01/2020 7:54:52 AM By: Carlene Coria RN Entered By: Carlene Coria on 09/23/2020 08:43:46 Stong, Yoana T. ([id number]) -------------------------------------------------------------------------------- Wound Assessment Details Patient Name: Abernathy, Chandy T. Date of Service: 09/23/2020 8:00 AM Medical Record Number: [id number] Patient Account Number: [id number] Date of Birth/Sex: 16-Jul-1940 (79 y.o. F) Treating RN: Carlene Coria Primary Care  Monseratt Ledin: Elsie Stain Other Clinician: Referring Pebble Botkin: Elsie Stain Treating Chenay Nesmith/Extender: Jeri Cos Weeks in Treatment: 4 Wound Status Wound Number: 3 Primary Etiology: Venous Leg Ulcer Wound Location: Left, Medial Ankle Wound Status: Healed - Epithelialized Wounding Event: Gradually Appeared Comorbid History: Hypertension Date Acquired: 08/13/2020 Weeks Of Treatment: 4 Clustered Wound: No Wound Measurements Length: (cm) 0 Width: (cm) 0 Depth: (cm) 0 Area: (cm) 0 Volume: (cm) 0 % Reduction in Area: 100% % Reduction in Volume: 100% Epithelialization: Large (67-100%) Tunneling: No Undermining: No Wound Description Classification: Full Thickness Without Exposed Support Structure Exudate Amount: None Present s Foul Odor After Cleansing: No Slough/Fibrino No Wound Bed Granulation Amount: None Present (0%) Exposed Structure Necrotic Amount: None Present (0%) Fascia Exposed: No Fat Layer (Subcutaneous Tissue) Exposed: No Tendon Exposed: No Muscle Exposed: No Joint Exposed: No Bone Exposed: No Treatment Notes Wound #3 (Ankle) Wound Laterality: Left, Medial Cleanser Peri-Wound Care Topical Primary Dressing Secondary Dressing Secured With Compression Wrap Compression Stockings Add-Ons Electronic Signature(s) Signed: 10/01/2020 7:54:52 AM By: Carlene Coria RN Entered By: Carlene Coria on 09/23/2020 08:41:27 Braunschweig, Mardelle T. ([id number]) -------------------------------------------------------------------------------- Vitals Details Patient Name: [name], Mettie T. Date of Service: 09/23/2020 8:00 AM Medical Record Number: [id number] Patient Account Number: [id number] Date of Birth/Sex: 07-01-1940 (  80 y.o. F) Treating RN: Carlene Coria Primary Care Samary Shatz: Elsie Stain Other Clinician: Referring Muriah Harsha: Elsie Stain Treating Amberlynn Tempesta/Extender: Skipper Cliche in Treatment: 4 Vital Signs Time Taken: 08:10 Temperature (F): 98 Height (in):  68 Pulse (bpm): 76 Weight (lbs): 192 Respiratory Rate (breaths/min): 18 Body Mass Index (BMI): 29.2 Blood Pressure (mmHg): 165/73 Reference Range: 80 - 120 mg / dl Electronic Signature(s) Signed: 10/01/2020 7:54:52 AM By: Carlene Coria RN Entered By: Carlene Coria on 09/23/2020 08:11:10

## 2020-10-01 NOTE — Progress Notes (Signed)
ANOLA, LIEDTKE ([id number]) Visit Report for 09/23/2020 Chief Complaint Document Details Patient Name: [name], [name] Date of Service: 09/23/2020 8:00 AM Medical Record Number: [id number] Patient Account Number: [id number] Date of Birth/Sex: [birth year] (79 y.o. F) Treating RN: Carlene Coria Primary Care Provider: Elsie Stain Other Clinician: Referring Provider: Elsie Stain Treating Provider/Extender: Skipper Cliche in Treatment: 4 Information Obtained from: Patient Chief Complaint Patient presents for treatment of an open ulcer due to venous insufficiency Situated at her right medial ankle for about 3 weeks Electronic Signature(s) Signed: 09/26/2020 3:40:33 PM By: [name] PA-C Entered By: [name] on 09/26/2020 15:40:33 Matos, Makaylynn T. ([id number]) -------------------------------------------------------------------------------- HPI Details Patient Name: [name], [name] Date of Service: 09/23/2020 8:00 AM Medical Record Number: [id number] Patient Account Number: [id number] Date of Birth/Sex: 1940-01-23 (79 y.o. F) Treating RN: Carlene Coria Primary Care Provider: Elsie Stain Other Clinician: Referring Provider: Elsie Stain Treating Provider/Extender: Skipper Cliche in Treatment: 4 History of Present Illness HPI Description: 80 year old patient was recently seen by her PCP [name] and he noted erythema and ulceration and started her on doxycycline. He noted a intact dorsalis pedis pulse.the medial right ankle had skin ulceration which had been treated by Keflex recently and notices made that she had had wound care treatments earlier. past medical history significant for back pain, endometriosis, high cholesterol, hypothyroidism, migraine and venous stasis ulcer treated at [facility]. she has never been a smoker the patient gives a history of right varicose vein stripping done in the year 2000 at [facility]. She has not been wearing her  compression since. 11/05/2016 -- the venous duplex study has been done for reflux and we are awaiting her arterial study to be done. The patient is of a very nervous disposition and seems to be very anxious. 11/15/2016 -- venous duplex reflux study -- venous incompetence noted in the right saphenofemoral junction and the right great saphenous vein on the right lower extremity. The right great saphenous vein was harvested as mentioned above. Arterial study was done on 11/12/2016 but the results are not yet available. I will refer her to see the vascular surgeons for an opinion. 11/22/2016 -- lower extremity arterial duplex examination done showed no hemodynamically significant stenosis in bilateral arterial flow examination. As far as a venous examination goes she had declined to get her left leg studied and the right leg is also noted on 11/15/2016. On asking the patient about this she says she never told the technician not to do her left leg but it may have been a misunderstanding. The patient will be seeing the vascular surgeons soon and I have asked them to address whether the left leg needs a venous duplex study. 12/13/2016 -- was seen by Dr. Leotis Pain on 11/30/2016, who reviewed her recent venous duplex study,a noninvasive arterial study, and after a thorough review has recommended to continue with compression stockings daily and if the venous ulceration recurs then he would consider her for form sclerotherapy of the incompetent accessory saphenous vein. 12/31/2016 -- the patient was recently discharged but comes back with a small open area on her medial foot which is in the region of previous varicosities which are under the care of Dr. Corene Cornea dew. The patient has been very compliant with wearing her compression stockings. 01/14/2017 -- she has done very well and her wounds are now healed. She has still not gotten an appointment to see Dr. Lucky Cowboy and I have urged her to keep up with  this Readmission: 08/22/2020 upon evaluation today patient presents for follow-up concerning issues that she has been having with wounds on her left medial ankle. She tells me that she was actually working outside of roughly a week ago on 08/13/2020 when she does not remember any particular injury but nonetheless ended up with having significant blood in her compression sock when she came back inside. This scared her and they almost went to the hospital but opted not to due to the fact that she was able to get the bleeding stopped. Nonetheless she continued to have issues here with some pain and drainage from the area and though it does not appear to be actively bleeding at this time I still see evidence of an open wound. She does have multiple spider veins noted in and around this area and it was actually from 1 of these that the bleeding began. She has not seen anyone about this and potentially there could be a possibility that Dr. Otho Ket someone could possibly see about doing something to collapse these and prevent this type of thing from going on again. Obviously this would not be cosmetic but now true medical issue since she is already had 1 instance of rupture and spontaneous bleeding despite any specific injury. I discussed this with her today she is definitely interested in checking into this, and I will possibly see about contacting the office of Dr. Doing Dr. Delana Meyer to see if this is something they do or if I would need to refer her to [facility]. Fortunately the patient does not have any significant major medical problems that would affect the healing other than the chronic venous insufficiency. 08/27/2020 upon evaluation today patient's wound actually showing signs of excellent improvement which is great news. Fortunately there does not appear to be any signs of active infection which is also excellent news. In general I think that she is making great progress. 09/05/2020 upon evaluation today  patient presents for reevaluation in regard to her ankle ulcer. This actually is showing signs of improvement and again is very small and very pleased with what I see today. She has done well with her compression sock at home using diet and the silver alginate along with a border foam to cover. In general I think she is very close to complete resolution. 09/15/2020 upon evaluation today patient appears to be doing better in regard to her wound. This is actually extremely small and I think is very close to complete resolution. With that being said there is a little bit of dried drainage and eschar over the surface of the wound that I did have to remove away today. She tolerated that without any complication which is great news. 09/23/2020 upon evaluation today patient appears to be doing well currently in regard to her wound. Fortunately she in fact is doing so well that I feel like she is indeed healed today which is awesome news. There does not appear to be any signs of active infection which is also great news. Electronic Signature(s) Signed: 09/26/2020 3:42:05 PM By: [name] PA-C Previous Signature: 09/26/2020 3:40:47 PM Version By: [name] PA-C Entered By: [name] on 09/26/2020 15:42:04 Davie, Avaley T. ([id number]) DALERY, HEITKAMP ([id number]) -------------------------------------------------------------------------------- Physical Exam Details Patient Name: [name], [name] Date of Service: 09/23/2020 8:00 AM Medical Record Number: [id number] Patient Account Number: [id number] Date of Birth/Sex: 01-22-1940 (79 y.o. F) Treating RN: Carlene Coria Primary Care Provider: Elsie Stain Other Clinician: Referring Provider: Elsie Stain Treating  Provider/Extender: Jeri Cos Weeks in Treatment: 4 Constitutional Well-nourished and well-hydrated in no acute distress. Respiratory normal breathing without difficulty. Psychiatric this patient is able to make decisions and  demonstrates good insight into disease process. Alert and Oriented x 3. pleasant and cooperative. Notes Upon inspection patient's wound bed actually showed signs of complete epithelization and this is great news I do not see anything open at this point. Overall I am extremely happy with where we stand today. Electronic Signature(s) Signed: 09/26/2020 3:41:02 PM By: [name] PA-C Entered By: [name] on 09/26/2020 15:41:02 Defibaugh, Magdalen TMarland Kitchen ([id number]) -------------------------------------------------------------------------------- Physician Orders Details Patient Name: Kim Fallen T. Date of Service: 09/23/2020 8:00 AM Medical Record Number: [id number] Patient Account Number: [id number] Date of Birth/Sex: 12-31-1940 (79 y.o. F) Treating RN: Carlene Coria Primary Care Provider: Elsie Stain Other Clinician: Referring Provider: Elsie Stain Treating Provider/Extender: Skipper Cliche in Treatment: 4 Verbal / Phone Orders: No Diagnosis Coding Discharge From [facility] Services o Discharge from [place] [facility] Complete o Wear compression garments daily. Put garments on first thing when you wake up and remove them before bed. o Moisturize legs daily after removing compression garments. Electronic Signature(s) Signed: 09/26/2020 11:43:44 AM By: [name] PA-C Signed: 10/01/2020 7:54:52 AM By: Carlene Coria RN Entered By: Carlene Coria on 09/23/2020 08:42:54 Gover, Solstice TMarland Kitchen ([id number]) -------------------------------------------------------------------------------- Problem List Details Patient Name: Caver, Laiken T. Date of Service: 09/23/2020 8:00 AM Medical Record Number: [id number] Patient Account Number: [id number] Date of Birth/Sex: Jan 20, 1940 (79 y.o. F) Treating RN: Carlene Coria Primary Care Provider: Elsie Stain Other Clinician: Referring Provider: Elsie Stain Treating Provider/Extender: Skipper Cliche in Treatment: 4 Active  Problems ICD-10 Encounter Code Description Active Date MDM Diagnosis I87.2 Venous insufficiency (chronic) (peripheral) 08/22/2020 No Yes L97.322 Non-pressure chronic ulcer of left ankle with fat layer exposed 08/22/2020 No Yes Inactive Problems Resolved Problems Electronic Signature(s) Signed: 09/26/2020 3:40:27 PM By: [name] PA-C Entered By: [name] on 09/26/2020 15:40:27 Konczal, Eriona T. ([id number]) -------------------------------------------------------------------------------- Progress Note Details Patient Name: Leidy, Tanikka T. Date of Service: 09/23/2020 8:00 AM Medical Record Number: [id number] Patient Account Number: [id number] Date of Birth/Sex: 12-Mar-1940 (79 y.o. F) Treating RN: Carlene Coria Primary Care Provider: Elsie Stain Other Clinician: Referring Provider: Elsie Stain Treating Provider/Extender: Skipper Cliche in Treatment: 4 Subjective Chief Complaint Information obtained from Patient Patient presents for treatment of an open ulcer due to venous insufficiency Situated at her right medial ankle for about 3 weeks History of Present Illness (HPI) 80 year old patient was recently seen by her PCP [name] and he noted erythema and ulceration and started her on doxycycline. He noted a intact dorsalis pedis pulse.the medial right ankle had skin ulceration which had been treated by Keflex recently and notices made that she had had wound care treatments earlier. past medical history significant for back pain, endometriosis, high cholesterol, hypothyroidism, migraine and venous stasis ulcer treated at [facility]. she has never been a smoker the patient gives a history of right varicose vein stripping done in the year 2000 at [facility]. She has not been wearing her compression since. 11/05/2016 -- the venous duplex study has been done for reflux and we are awaiting her arterial study to be done. The patient is of a very nervous disposition and  seems to be very anxious. 11/15/2016 -- venous duplex reflux study -- venous incompetence noted in the right saphenofemoral junction and the right great saphenous vein on the right lower extremity. The  right great saphenous vein was harvested as mentioned above. Arterial study was done on 11/12/2016 but the results are not yet available. I will refer her to see the vascular surgeons for an opinion. 11/22/2016 -- lower extremity arterial duplex examination done showed no hemodynamically significant stenosis in bilateral arterial flow examination. As far as a venous examination goes she had declined to get her left leg studied and the right leg is also noted on 11/15/2016. On asking the patient about this she says she never told the technician not to do her left leg but it may have been a misunderstanding. The patient will be seeing the vascular surgeons soon and I have asked them to address whether the left leg needs a venous duplex study. 12/13/2016 -- was seen by Dr. Leotis Pain on 11/30/2016, who reviewed her recent venous duplex study,a noninvasive arterial study, and after a thorough review has recommended to continue with compression stockings daily and if the venous ulceration recurs then he would consider her for form sclerotherapy of the incompetent accessory saphenous vein. 12/31/2016 -- the patient was recently discharged but comes back with a small open area on her medial foot which is in the region of previous varicosities which are under the care of Dr. Corene Cornea dew. The patient has been very compliant with wearing her compression stockings. 01/14/2017 -- she has done very well and her wounds are now healed. She has still not gotten an appointment to see Dr. Lucky Cowboy and I have urged her to keep up with this Readmission: 08/22/2020 upon evaluation today patient presents for follow-up concerning issues that she has been having with wounds on her left medial ankle. She tells me that she was  actually working outside of roughly a week ago on 08/13/2020 when she does not remember any particular injury but nonetheless ended up with having significant blood in her compression sock when she came back inside. This scared her and they almost went to the hospital but opted not to due to the fact that she was able to get the bleeding stopped. Nonetheless she continued to have issues here with some pain and drainage from the area and though it does not appear to be actively bleeding at this time I still see evidence of an open wound. She does have multiple spider veins noted in and around this area and it was actually from 1 of these that the bleeding began. She has not seen anyone about this and potentially there could be a possibility that Dr. Otho Ket someone could possibly see about doing something to collapse these and prevent this type of thing from going on again. Obviously this would not be cosmetic but now true medical issue since she is already had 1 instance of rupture and spontaneous bleeding despite any specific injury. I discussed this with her today she is definitely interested in checking into this, and I will possibly see about contacting the office of Dr. Doing Dr. Delana Meyer to see if this is something they do or if I would need to refer her to [facility]. Fortunately the patient does not have any significant major medical problems that would affect the healing other than the chronic venous insufficiency. 08/27/2020 upon evaluation today patient's wound actually showing signs of excellent improvement which is great news. Fortunately there does not appear to be any signs of active infection which is also excellent news. In general I think that she is making great progress. 09/05/2020 upon evaluation today patient presents for reevaluation in regard to  her ankle ulcer. This actually is showing signs of improvement and again is very small and very pleased with what I see today. She has done well  with her compression sock at home using diet and the silver alginate along with a border foam to cover. In general I think she is very close to complete resolution. 09/15/2020 upon evaluation today patient appears to be doing better in regard to her wound. This is actually extremely small and I think is very close to complete resolution. With that being said there is a little bit of dried drainage and eschar over the surface of the wound that I did have to remove away today. She tolerated that without any complication which is great news. 09/23/2020 upon evaluation today patient appears to be doing well currently in regard to her wound. Fortunately she in fact is doing so well that I feel like she is indeed healed today which is awesome news. There does not appear to be any signs of active infection which is also great news. DAJON, HUTCHERSON T. ([id number]) Objective Constitutional Well-nourished and well-hydrated in no acute distress. Vitals Time Taken: 8:10 AM, Height: 68 in, Weight: 192 lbs, BMI: 29.2, Temperature: 98 F, Pulse: 76 bpm, Respiratory Rate: 18 breaths/min, Blood Pressure: 165/73 mmHg. Respiratory normal breathing without difficulty. Psychiatric this patient is able to make decisions and demonstrates good insight into disease process. Alert and Oriented x 3. pleasant and cooperative. General Notes: Upon inspection patient's wound bed actually showed signs of complete epithelization and this is great news I do not see anything open at this point. Overall I am extremely happy with where we stand today. Integumentary (Hair, Skin) Wound #3 status is Healed - Epithelialized. Original cause of wound was Gradually Appeared. The date acquired was: 08/13/2020. The wound has been in treatment 4 weeks. The wound is located on the Left,Medial Ankle. The wound measures 0cm length x 0cm width x 0cm depth; 0cm^2 area and 0cm^3 volume. There is no tunneling or undermining noted. There is a none  present amount of drainage noted. There is no granulation within the wound bed. There is no necrotic tissue within the wound bed. Assessment Active Problems ICD-10 Venous insufficiency (chronic) (peripheral) Non-pressure chronic ulcer of left ankle with fat layer exposed Plan Discharge From [facility] Services: Discharge from [place] Treatment Complete Wear compression garments daily. Put garments on first thing when you wake up and remove them before bed. Moisturize legs daily after removing compression garments. 1. Would recommend currently that we going continue with the wound care measures as before and the patient is in agreement with plan. This includes the use of the compression wraps. I do think that this is something she needs to ongoing continue with that was discussed with the patient today. Otherwise I really feel like she is completely healed and everything otherwise seems to be doing quite well. We will see patient back for follow-up visit as needed. Electronic Signature(s) Signed: 09/26/2020 3:42:16 PM By: [name] PA-C Previous Signature: 09/26/2020 3:41:43 PM Version By: [name] PA-C Entered By: [name] on 09/26/2020 15:42:15 Pidcock, Carlene T. ([id number]) -------------------------------------------------------------------------------- SuperBill Details Patient Name: Kim Fallen T. Date of Service: 09/23/2020 Medical Record Number: [id number] Patient Account Number: [id number] Date of Birth/Sex: 1940-03-20 (80 y.o. F) Treating RN: Carlene Coria Primary Care Provider: Elsie Stain Other Clinician: Referring Provider: Elsie Stain Treating Provider/Extender: Skipper Cliche in Treatment: 4 Diagnosis Coding ICD-10 Codes Code Description I87.2 Venous insufficiency (chronic) (peripheral)  [id number] Non-pressure chronic ulcer of left ankle with fat layer exposed Facility Procedures CPT4 Code: [id number] Description: [phone] - WOUND CARE VISIT-LEV  2 EST PT Modifier: Quantity: 1 Physician Procedures CPT4 Code: [id number] Description: [id number] - WC PHYS LEVEL 3 - EST PT Modifier: Quantity: 1 CPT4 Code: Description: ICD-10 Diagnosis Description I87.2 Venous insufficiency (chronic) (peripheral) L97.322 Non-pressure chronic ulcer of left ankle with fat layer exposed Modifier: Quantity: Electronic Signature(s) Signed: 09/26/2020 3:42:27 PM By: [name] PA-C Previous Signature: 09/26/2020 11:43:44 AM Version By: [name] PA-C Entered By: [name] on 09/26/2020 15:42:27

## 2020-10-03 DIAGNOSIS — M5459 Other low back pain: Secondary | ICD-10-CM | POA: Diagnosis not present

## 2020-10-03 DIAGNOSIS — M47817 Spondylosis without myelopathy or radiculopathy, lumbosacral region: Secondary | ICD-10-CM | POA: Diagnosis not present

## 2020-10-10 ENCOUNTER — Other Ambulatory Visit: Payer: Medicare Other

## 2020-10-12 ENCOUNTER — Other Ambulatory Visit: Payer: Self-pay | Admitting: Family Medicine

## 2020-10-16 ENCOUNTER — Encounter: Payer: Medicare Other | Admitting: Family Medicine

## 2020-10-16 ENCOUNTER — Other Ambulatory Visit: Payer: Self-pay

## 2020-10-16 ENCOUNTER — Ambulatory Visit (INDEPENDENT_AMBULATORY_CARE_PROVIDER_SITE_OTHER): Payer: Medicare Other | Admitting: Family Medicine

## 2020-10-16 ENCOUNTER — Encounter: Payer: Self-pay | Admitting: Family Medicine

## 2020-10-16 VITALS — BP 140/80 | HR 84 | Temp 98.0°F | Ht 68.0 in | Wt 207.0 lb

## 2020-10-16 DIAGNOSIS — Z23 Encounter for immunization: Secondary | ICD-10-CM | POA: Diagnosis not present

## 2020-10-16 DIAGNOSIS — E039 Hypothyroidism, unspecified: Secondary | ICD-10-CM | POA: Diagnosis not present

## 2020-10-16 DIAGNOSIS — I10 Essential (primary) hypertension: Secondary | ICD-10-CM

## 2020-10-16 DIAGNOSIS — E559 Vitamin D deficiency, unspecified: Secondary | ICD-10-CM

## 2020-10-16 DIAGNOSIS — Z7189 Other specified counseling: Secondary | ICD-10-CM

## 2020-10-16 DIAGNOSIS — F419 Anxiety disorder, unspecified: Secondary | ICD-10-CM | POA: Diagnosis not present

## 2020-10-16 DIAGNOSIS — Z0000 Encounter for general adult medical examination without abnormal findings: Secondary | ICD-10-CM

## 2020-10-16 DIAGNOSIS — E782 Mixed hyperlipidemia: Secondary | ICD-10-CM

## 2020-10-16 DIAGNOSIS — G2581 Restless legs syndrome: Secondary | ICD-10-CM | POA: Diagnosis not present

## 2020-10-16 LAB — CBC WITH DIFFERENTIAL/PLATELET
Basophils Absolute: 0.1 10*3/uL (ref 0.0–0.1)
Basophils Relative: 1.2 % (ref 0.0–3.0)
Eosinophils Absolute: 0.4 10*3/uL (ref 0.0–0.7)
Eosinophils Relative: 6 % — ABNORMAL HIGH (ref 0.0–5.0)
HCT: 37.5 % (ref 36.0–46.0)
Hemoglobin: 12.5 g/dL (ref 12.0–15.0)
Lymphocytes Relative: 25.2 % (ref 12.0–46.0)
Lymphs Abs: 1.6 10*3/uL (ref 0.7–4.0)
MCHC: 33.3 g/dL (ref 30.0–36.0)
MCV: 96.7 fl (ref 78.0–100.0)
Monocytes Absolute: 0.6 10*3/uL (ref 0.1–1.0)
Monocytes Relative: 9.3 % (ref 3.0–12.0)
Neutro Abs: 3.7 10*3/uL (ref 1.4–7.7)
Neutrophils Relative %: 58.3 % (ref 43.0–77.0)
Platelets: 257 10*3/uL (ref 150.0–400.0)
RBC: 3.88 Mil/uL (ref 3.87–5.11)
RDW: 13.1 % (ref 11.5–15.5)
WBC: 6.4 10*3/uL (ref 4.0–10.5)

## 2020-10-16 LAB — VITAMIN D 25 HYDROXY (VIT D DEFICIENCY, FRACTURES): VITD: 37.49 ng/mL (ref 30.00–100.00)

## 2020-10-16 LAB — LIPID PANEL
Cholesterol: 173 mg/dL (ref 0–200)
HDL: 46.7 mg/dL (ref >39.00)
LDL Cholesterol: 96 mg/dL (ref 0–99)
NonHDL: 125.98
Total CHOL/HDL Ratio: 4
Triglycerides: 152 mg/dL — ABNORMAL HIGH (ref 0.0–149.0)
VLDL: 30.4 mg/dL (ref 0.0–40.0)

## 2020-10-16 LAB — TSH: TSH: 1.16 u[IU]/mL (ref 0.35–5.50)

## 2020-10-16 LAB — COMPREHENSIVE METABOLIC PANEL
ALT: 35 U/L (ref 0–35)
AST: 32 U/L (ref 0–37)
Albumin: 4.1 g/dL (ref 3.5–5.2)
Alkaline Phosphatase: 98 U/L (ref 39–117)
BUN: 12 mg/dL (ref 6–23)
CO2: 28 mEq/L (ref 19–32)
Calcium: 9.4 mg/dL (ref 8.4–10.5)
Chloride: 106 mEq/L (ref 96–112)
Creatinine, Ser: 0.73 mg/dL (ref 0.40–1.20)
GFR: 77.88 mL/min (ref >60.00)
Glucose, Bld: 98 mg/dL (ref 70–99)
Potassium: 5 mEq/L (ref 3.5–5.1)
Sodium: 142 mEq/L (ref 135–145)
Total Bilirubin: 0.7 mg/dL (ref 0.2–1.2)
Total Protein: 6.6 g/dL (ref 6.0–8.3)

## 2020-10-16 LAB — IRON: Iron: 98 ug/dL (ref 42–145)

## 2020-10-16 MED ORDER — ATENOLOL 50 MG PO TABS: 50.0000 mg | ORAL_TABLET | Freq: Every day | ORAL | 3 refills | Status: DC

## 2020-10-16 MED ORDER — ROPINIROLE HCL 2 MG PO TABS: 2.0000 mg | ORAL_TABLET | Freq: Every evening | ORAL | 3 refills | Status: DC | PRN

## 2020-10-16 MED ORDER — LEVOTHYROXINE SODIUM 100 MCG PO TABS: ORAL_TABLET | ORAL | 3 refills | Status: DC

## 2020-10-16 MED ORDER — DIAZEPAM 5 MG PO TABS: ORAL_TABLET | ORAL | 1 refills | Status: DC

## 2020-10-16 MED ORDER — OMEPRAZOLE 20 MG PO CPDR: 20.0000 mg | DELAYED_RELEASE_CAPSULE | Freq: Every day | ORAL | 3 refills | Status: DC

## 2020-10-16 NOTE — Progress Notes (Signed)
This visit occurred during the SARS-CoV-2 public health emergency.  Safety protocols were in place, including screening questions prior to the visit, additional usage of staff PPE, and extensive cleaning of exam room while observing appropriate contact time as indicated for disinfecting solutions.  I have personally reviewed the Medicare Annual Wellness questionnaire and have noted 1. The patient's medical and social history 2. Their use of alcohol, tobacco or illicit drugs 3. Their current medications and supplements 4. The patient's functional ability including ADL's, fall risks, home safety risks and hearing or visual             impairment. 5. Diet and physical activities 6. Evidence for depression or mood disorders  The patients weight, height, BMI have been recorded in the chart and visual acuity is per eye clinic.  I have made referrals, counseling and provided education to the patient based review of the above and I have provided the pt with a written personalized care plan for preventive services.  Provider list updated- see scanned forms.  Routine anticipatory guidance given to patient.  See health maintenance. The possibility exists that previously documented standard health maintenance information may have been brought forward from a previous encounter into this note.  If needed, that same information has been updated to reflect the current situation based on today's encounter.    Flu 2022 Shingles prev done.  PNA UTD Tetanus 2015 Covid prev done.  Colon CA screening not due given her age Breast cancer screening done this year per patient report.   DXA 2020 Advance directive-Husband designated if patient were incapacitated.   Cognitive function addressed- see scanned forms- and if abnormal then additional documentation follows.   In addition to [facility] Wellness, follow up visit for the below conditions:  RLS.  Taking iron QOD, with labs pending.  Still on requip and  gabapentin.  No ADE on iron but she did notice occ darker stools as expected.    She is in therapy about her back.    Her daughter is on hospice.  D/w pt.  Condolences offered.   Prn use of BZD, no ADE on med.    Elevated Cholesterol: Using medications without problems: yes Muscle aches: some R quad pain.   Diet compliance: d/w pt.  Exercise: d/w pt.    Hypothyroidism.  No ADE on med.  Compliant on empty stomach.  Labs pending.  No neck mass noted by patient.    Hypertension:    Using medication without problems or lightheadedness: yes Chest pain with exertion:no Edema:no Short of breath:no Labs pending.     PMH and SH reviewed  Meds, vitals, and allergies reviewed.   ROS: Per HPI.  Unless specifically indicated otherwise in HPI, the patient denies:  General: fever. Eyes: acute vision changes ENT: sore throat Cardiovascular: chest pain Respiratory: SOB GI: vomiting GU: dysuria Musculoskeletal: acute back pain Derm: acute rash Neuro: acute motor dysfunction Psych: worsening mood Endocrine: polydipsia Heme: bleeding Allergy: hayfever  GEN: nad, alert and oriented HEENT: ncat NECK: supple w/o LA CV: rrr. PULM: ctab, no inc wob ABD: soft, +bs EXT: no edema SKIN: no acute rash

## 2020-10-16 NOTE — Patient Instructions (Addendum)
Go to the lab on the way out.   If you have mychart we'll likely use that to update you.    Take care.  Glad to see you.  Stop simvastatin for 10 days and let me know if the aches get better.

## 2020-10-20 ENCOUNTER — Telehealth: Payer: Self-pay | Admitting: Family Medicine

## 2020-10-20 DIAGNOSIS — H353132 Nonexudative age-related macular degeneration, bilateral, intermediate dry stage: Secondary | ICD-10-CM | POA: Diagnosis not present

## 2020-10-20 NOTE — Assessment & Plan Note (Addendum)
Taking iron QOD, with labs pending.  Still on requip and gabapentin.  No ADE on iron but she did notice occ darker stools as expected.  Recheck labs before making any medication changes, other than temporarily holding her statin.  See notes on labs.  Continue iron gabapentin and Requip for now.

## 2020-10-20 NOTE — Assessment & Plan Note (Signed)
Continue atenolol.  See notes on labs.

## 2020-10-20 NOTE — Assessment & Plan Note (Signed)
Flu 2022 Shingles prev done.  PNA UTD Tetanus 2015 Covid prev done.  Colon CA screening not due given her age Breast cancer screening done this year per patient report.   DXA 2020 Advance directive-Husband designated if patient were incapacitated.   Cognitive function addressed- see scanned forms- and if abnormal then additional documentation follows.

## 2020-10-20 NOTE — Telephone Encounter (Signed)
Pt returned call . Would like a call back [phone]

## 2020-10-20 NOTE — Assessment & Plan Note (Signed)
Continue levothyroxine.  See notes on labs. 

## 2020-10-20 NOTE — Assessment & Plan Note (Signed)
Continue as needed benzodiazepine use.  No adverse effect on medication.  Condolences offered regarding her daughter.

## 2020-10-20 NOTE — Assessment & Plan Note (Signed)
Advance directive-Husband designated if patient were incapacitated.

## 2020-10-20 NOTE — Assessment & Plan Note (Signed)
I asked her to stop simvastatin for 10 days and see if she noticed any changes in her legs.  See after visit summary.

## 2020-10-21 NOTE — Telephone Encounter (Signed)
Patient notified of lab results and verbalized understanding.  ° °

## 2020-10-24 DIAGNOSIS — M5459 Other low back pain: Secondary | ICD-10-CM | POA: Diagnosis not present

## 2020-10-24 DIAGNOSIS — M47817 Spondylosis without myelopathy or radiculopathy, lumbosacral region: Secondary | ICD-10-CM | POA: Diagnosis not present

## 2020-11-03 ENCOUNTER — Encounter: Payer: Self-pay | Admitting: Family Medicine

## 2020-11-17 DIAGNOSIS — M47817 Spondylosis without myelopathy or radiculopathy, lumbosacral region: Secondary | ICD-10-CM | POA: Diagnosis not present

## 2020-11-17 DIAGNOSIS — M545 Low back pain, unspecified: Secondary | ICD-10-CM | POA: Diagnosis not present

## 2020-12-03 DIAGNOSIS — M47817 Spondylosis without myelopathy or radiculopathy, lumbosacral region: Secondary | ICD-10-CM | POA: Diagnosis not present

## 2020-12-14 NOTE — Telephone Encounter (Addendum)
Addendum, update from patient.  She is back on statin.  She didn't note a change off statin.  Her RLS sx are some better in the meantime.  We agreed for her to continue as is.  She'll update me as needed.

## 2020-12-22 DIAGNOSIS — M47817 Spondylosis without myelopathy or radiculopathy, lumbosacral region: Secondary | ICD-10-CM | POA: Diagnosis not present

## 2020-12-22 DIAGNOSIS — M545 Low back pain, unspecified: Secondary | ICD-10-CM | POA: Diagnosis not present

## 2020-12-28 ENCOUNTER — Other Ambulatory Visit: Payer: Self-pay | Admitting: Family Medicine

## 2021-01-08 ENCOUNTER — Other Ambulatory Visit: Payer: Self-pay

## 2021-01-08 ENCOUNTER — Encounter: Payer: Self-pay | Admitting: Family Medicine

## 2021-01-08 ENCOUNTER — Telehealth (INDEPENDENT_AMBULATORY_CARE_PROVIDER_SITE_OTHER): Payer: Medicare Other | Admitting: Family Medicine

## 2021-01-08 VITALS — BP 154/72 | HR 73 | Ht 68.0 in

## 2021-01-08 DIAGNOSIS — R051 Acute cough: Secondary | ICD-10-CM

## 2021-01-08 MED ORDER — AZITHROMYCIN 250 MG PO TABS: ORAL_TABLET | ORAL | 0 refills | Status: DC

## 2021-01-08 MED ORDER — PROMETHAZINE-DM 6.25-15 MG/5ML PO SYRP: 5.0000 mL | ORAL_SOLUTION | Freq: Every evening | ORAL | 0 refills | Status: DC | PRN

## 2021-01-08 NOTE — Progress Notes (Signed)
VIRTUAL VISIT Due to national recommendations of social distancing due to [place] 19, a virtual visit is felt to be most appropriate for this patient at this time.   I connected with the patient on 01/08/21 at  2:40 PM EST by virtual telehealth platform and verified that I am speaking with the correct person using two identifiers.   I discussed the limitations, risks, security and privacy concerns of performing an evaluation and management service by  virtual telehealth platform and the availability of in person appointments. I also discussed with the patient that there may be a patient responsible charge related to this service. The patient expressed understanding and agreed to proceed.  Patient location: Home Provider Location: [place] Participants: Eliezer Lofts and [name]   Chief Complaint  Patient presents with   Cough    Negative Covid Test today    History of Present Illness:  80 year old female patient of Dr. Josefine Class with history of HTN presents with new onset cough.   Husband seen by myself earlier today with possible viral vs bacterial bronchitis.   Date of onset:  12/18  She reports she started with runny nose and productive cough.  No fever, no chills.  NO CP, no SOB , no wheeze. No ear pain,  mild scratchy throat.  No body aches.   Has been using tylenol.    Cough keeping her up at night.     Nonsmoker   No history of chronic lung disease. COVID 19 screen COVID testing:12/22 negative today COVID vaccine: 10/30/2019 , 04/10/2019 , 03/16/2019 COVID exposure: No recent travel or known exposure to [place], flu  The importance of social distancing was discussed today.    Review of Systems  Constitutional:  Negative for chills and fever.  HENT:  Positive for congestion and sore throat. Negative for ear pain.   Eyes:  Negative for pain and redness.  Respiratory:  Positive for cough. Negative for shortness of breath.   Cardiovascular:  Negative  for chest pain, palpitations and leg swelling.  Gastrointestinal:  Negative for abdominal pain, blood in stool, constipation, diarrhea, nausea and vomiting.  Genitourinary:  Negative for dysuria.  Musculoskeletal:  Negative for falls and myalgias.  Skin:  Negative for rash.  Neurological:  Negative for dizziness.  Psychiatric/Behavioral:  Negative for depression. The patient is not nervous/anxious.      Past Medical History:  Diagnosis Date   Back pain    Endometriosis    HLD (hyperlipidemia)    Hypothyroidism    Migraine    Venous stasis ulcer ([place]) 11/1997   Wound care center, [place]    reports that she has never smoked. She has never used smokeless tobacco. She reports that she does not drink alcohol and does not use drugs.   Current Outpatient Medications:    albuterol (VENTOLIN HFA) 108 (90 Base) MCG/ACT inhaler, Inhale 1-2 puffs into the lungs every 6 (six) hours as needed for wheezing (for cough)., Disp: 8 g, Rfl: 1   atenolol (TENORMIN) 50 MG tablet, Take 1 tablet (50 mg total) by mouth daily., Disp: 90 tablet, Rfl: 3   butalbital-acetaminophen-caffeine (FIORICET) 50-325-40 MG tablet, TAKE ONE TABLET BY MOUTH TWICE A DAY AS NEEDED FOR FOR HEADACHE, Disp: 60 tablet, Rfl: 0   Cholecalciferol (VITAMIN D) 50 MCG (2000 UT) CAPS, Take 1 capsule (2,000 Units total) by mouth daily., Disp: , Rfl:    diazepam (VALIUM) 5 MG tablet, TAKE ONE-HALF TO ONE TABLET BY MOUTH EVERY  12 HOURS AS NEEDED FOR ANXIETY, Disp: 30 tablet, Rfl: 1   ferrous sulfate (FEROSUL) 325 (65 FE) MG tablet, TAKE ONE TABLET BY MOUTH EVERY OTHER DAY, Disp: 45 tablet, Rfl: 1   fluocinonide-emollient (LIDEX-E) 0.05 % cream, Apply 1 application topically 2 (two) times daily., Disp: 60 g, Rfl: 2   gabapentin (NEURONTIN) 300 MG capsule, Take up to 3 tabs a day for RLS, Disp: 90 capsule, Rfl: 5   ibuprofen (ADVIL) 800 MG tablet, TAKE ONE TABLET BY MOUTH EVERY 8 HOURS AS NEEDED FOR MODERATE PAIN.  TAKE WITH FOOD, Disp: 180  tablet, Rfl: 1   levothyroxine (SYNTHROID) 100 MCG tablet, 1 a day except for 1/2 tab a day on Sundays, Disp: 90 tablet, Rfl: 3   Multiple Vitamin (MULTIVITAMIN) tablet, Take 1 tablet by mouth daily., Disp: , Rfl:    omeprazole (PRILOSEC) 20 MG capsule, Take 1 capsule (20 mg total) by mouth daily., Disp: 90 capsule, Rfl: 3   rOPINIRole (REQUIP) 2 MG tablet, Take 1 tablet (2 mg total) by mouth at bedtime as needed., Disp: 90 tablet, Rfl: 3   simvastatin (ZOCOR) 40 MG tablet, TAKE ONE TABLET BY MOUTH EVERY NIGHT AT BEDTIME, Disp: 90 tablet, Rfl: 2   Observations/Objective: Blood pressure (!) 154/72, pulse 73, height 5\' 8"  (1.727 m).  Physical Exam  Physical Exam Constitutional:      General: The patient is not in acute distress. Pulmonary:     Effort: Pulmonary effort is normal. No respiratory distress.  Neurological:     Mental Status: The patient is alert and oriented to person, place, and time.  Psychiatric:        Mood and Affect: Mood normal.        Behavior: Behavior normal.   Assessment and Plan Problem List Items Addressed This Visit     Acute cough - Primary    Likely viral URI and will treat with symptomatic and supportive care. If not improving by day 7 of illness she can fill rx for azithromycin to cover bacterial infection possibility.  Go to ER if severe shortness of breath and call if not improving as expected.      Meds ordered this encounter  Medications   azithromycin (ZITHROMAX) 250 MG tablet    Sig: 2 tab po x 1 day then 1 tab po daily    Dispense:  6 tablet    Refill:  0   promethazine-dextromethorphan (PROMETHAZINE-DM) 6.25-15 MG/5ML syrup    Sig: Take 5 mLs by mouth at bedtime as needed for cough.    Dispense:  118 mL    Refill:  0      I discussed the assessment and treatment plan with the patient. The patient was provided an opportunity to ask questions and all were answered. The patient agreed with the plan and demonstrated an understanding of the  instructions.   The patient was advised to call back or seek an in-person evaluation if the symptoms worsen or if the condition fails to improve as anticipated.     Eliezer Lofts, MD

## 2021-01-08 NOTE — Assessment & Plan Note (Signed)
Likely viral URI and will treat with symptomatic and supportive care. If not improving by day 7 of illness she can fill rx for azithromycin to cover bacterial infection possibility.  Go to ER if severe shortness of breath and call if not improving as expected.

## 2021-01-20 ENCOUNTER — Telehealth: Payer: Self-pay | Admitting: Family Medicine

## 2021-01-20 MED ORDER — BENZONATATE 200 MG PO CAPS: 200.0000 mg | ORAL_CAPSULE | Freq: Two times a day (BID) | ORAL | 0 refills | Status: DC | PRN

## 2021-01-20 NOTE — Telephone Encounter (Signed)
Rx for benzonatate sent in but pt should follow up with Dr. Damita Dunnings if cough not improving.

## 2021-01-20 NOTE — Telephone Encounter (Signed)
Patient has been notified rx was sent in. Advised if she does not get better after taking this she will need a f/u appt. Patient verbalized understanding.

## 2021-01-20 NOTE — Telephone Encounter (Signed)
Pt husband called stating that pt has a bad cough and would like for Dr Damita Dunnings to call in prescription Benzonatate 200 mg. Pt husband stated that Dr Damita Dunnings called that in for pt before. Please advise.

## 2021-01-20 NOTE — Telephone Encounter (Signed)
Patient was seen by Dr. Diona Browner on 01/08/21. Are you okay to send in for patient?

## 2021-01-26 ENCOUNTER — Telehealth: Payer: Self-pay | Admitting: Family Medicine

## 2021-01-26 NOTE — Telephone Encounter (Signed)
[name] with [facility] called stating that she saw pt on Saturday and pt is still doing a lot of coughing and still having some congestion that's been going on for about 2 to 3 weeks. [name] states that she has taken all of the Azithromycin. [name] states that pt seen Dr Diona Browner on December 22 and doesn't seem to be doing any better. Please advise.

## 2021-01-26 NOTE — Telephone Encounter (Signed)
Spoke with patient and scheduled her with Dr. Damita Dunnings on 01/29/21 at 4:00 pm to be rechecked

## 2021-01-29 ENCOUNTER — Ambulatory Visit (INDEPENDENT_AMBULATORY_CARE_PROVIDER_SITE_OTHER): Payer: Medicare Other | Admitting: Family Medicine

## 2021-01-29 ENCOUNTER — Encounter: Payer: Self-pay | Admitting: Family Medicine

## 2021-01-29 ENCOUNTER — Other Ambulatory Visit: Payer: Self-pay

## 2021-01-29 DIAGNOSIS — R059 Cough, unspecified: Secondary | ICD-10-CM | POA: Diagnosis not present

## 2021-01-29 MED ORDER — ALBUTEROL SULFATE HFA 108 (90 BASE) MCG/ACT IN AERS: 1.0000 | INHALATION_SPRAY | Freq: Three times a day (TID) | RESPIRATORY_TRACT | 1 refills | Status: DC | PRN

## 2021-01-29 MED ORDER — BENZONATATE 200 MG PO CAPS: 200.0000 mg | ORAL_CAPSULE | Freq: Three times a day (TID) | ORAL | 2 refills | Status: DC | PRN

## 2021-01-29 NOTE — Progress Notes (Signed)
This visit occurred during the SARS-CoV-2 public health emergency.  Safety protocols were in place, including screening questions prior to the visit, additional usage of staff PPE, and extensive cleaning of exam room while observing appropriate contact time as indicated for disinfecting solutions.  RLS sx got worse with recent illness, d/w pt.  Husband was sick with similar illness prev.  Her symptoms started ~01/04/21.  Covid neg.  She took zithromax.  Benzonatate didn't help much.  Still coughing.  No sputum usually, now only rare sputum.  Cough worse at night.  No fevers now or prev.  No vomiting.  Some occ wheeze. Can still take a deep breath. Cough is slowly getting better.  No facial pain.  No ear pain.  She is clearly better than she was at [employer].   Meds, vitals, and allergies reviewed.   ROS: Per HPI unless specifically indicated in ROS section   GEN: nad, alert and oriented HEENT: ncat NECK: supple w/o LA CV: rrr.   PULM: ctab, no inc wob ABD: soft, +bs EXT: no edema SKIN: Well-perfused.

## 2021-01-29 NOTE — Patient Instructions (Signed)
Use tessalon as needed.  Okay to use albuterol if needed.  Cough should gradually get better.  Update me as needed.  Take care.  Glad to see you.

## 2021-01-30 ENCOUNTER — Ambulatory Visit: Payer: Medicare Other | Admitting: Family Medicine

## 2021-02-01 ENCOUNTER — Other Ambulatory Visit: Payer: Self-pay | Admitting: Family Medicine

## 2021-02-01 DIAGNOSIS — R059 Cough, unspecified: Secondary | ICD-10-CM | POA: Insufficient documentation

## 2021-02-01 NOTE — Assessment & Plan Note (Signed)
She is getting better but slowly.  Likely a postinfectious cough that should gradually improve.  Discussed pathophysiology.  Okay for outpatient follow-up Use tessalon as needed.  Okay to use albuterol if needed.   Update me as needed.  She agrees with plan.

## 2021-02-16 DIAGNOSIS — H353132 Nonexudative age-related macular degeneration, bilateral, intermediate dry stage: Secondary | ICD-10-CM | POA: Diagnosis not present

## 2021-02-24 ENCOUNTER — Encounter: Payer: Self-pay | Admitting: Gastroenterology

## 2021-03-16 DIAGNOSIS — L820 Inflamed seborrheic keratosis: Secondary | ICD-10-CM | POA: Diagnosis not present

## 2021-03-16 DIAGNOSIS — Z85828 Personal history of other malignant neoplasm of skin: Secondary | ICD-10-CM | POA: Diagnosis not present

## 2021-04-03 ENCOUNTER — Other Ambulatory Visit: Payer: Self-pay

## 2021-04-03 ENCOUNTER — Ambulatory Visit (INDEPENDENT_AMBULATORY_CARE_PROVIDER_SITE_OTHER): Payer: Medicare Other | Admitting: Family Medicine

## 2021-04-03 ENCOUNTER — Encounter: Payer: Self-pay | Admitting: Family Medicine

## 2021-04-03 VITALS — BP 122/78 | HR 76 | Temp 98.1°F | Ht 68.0 in | Wt 202.0 lb

## 2021-04-03 DIAGNOSIS — R202 Paresthesia of skin: Secondary | ICD-10-CM

## 2021-04-03 DIAGNOSIS — G2581 Restless legs syndrome: Secondary | ICD-10-CM

## 2021-04-03 LAB — CBC WITH DIFFERENTIAL/PLATELET
Basophils Absolute: 0.1 10*3/uL (ref 0.0–0.1)
Basophils Relative: 1.3 % (ref 0.0–3.0)
Eosinophils Absolute: 0.3 10*3/uL (ref 0.0–0.7)
Eosinophils Relative: 5 % (ref 0.0–5.0)
HCT: 38 % (ref 36.0–46.0)
Hemoglobin: 13 g/dL (ref 12.0–15.0)
Lymphocytes Relative: 25.7 % (ref 12.0–46.0)
Lymphs Abs: 1.4 10*3/uL (ref 0.7–4.0)
MCHC: 34.1 g/dL (ref 30.0–36.0)
MCV: 96.9 fl (ref 78.0–100.0)
Monocytes Absolute: 0.6 10*3/uL (ref 0.1–1.0)
Monocytes Relative: 10.2 % (ref 3.0–12.0)
Neutro Abs: 3.2 10*3/uL (ref 1.4–7.7)
Neutrophils Relative %: 57.8 % (ref 43.0–77.0)
Platelets: 242 10*3/uL (ref 150.0–400.0)
RBC: 3.92 Mil/uL (ref 3.87–5.11)
RDW: 13.3 % (ref 11.5–15.5)
WBC: 5.6 10*3/uL (ref 4.0–10.5)

## 2021-04-03 LAB — COMPREHENSIVE METABOLIC PANEL
ALT: 34 U/L (ref 0–35)
AST: 31 U/L (ref 0–37)
Albumin: 4 g/dL (ref 3.5–5.2)
Alkaline Phosphatase: 106 U/L (ref 39–117)
BUN: 14 mg/dL (ref 6–23)
CO2: 30 mEq/L (ref 19–32)
Calcium: 9.1 mg/dL (ref 8.4–10.5)
Chloride: 104 mEq/L (ref 96–112)
Creatinine, Ser: 0.77 mg/dL (ref 0.40–1.20)
GFR: 72.82 mL/min (ref >60.00)
Glucose, Bld: 128 mg/dL — ABNORMAL HIGH (ref 70–99)
Potassium: 4.7 mEq/L (ref 3.5–5.1)
Sodium: 140 mEq/L (ref 135–145)
Total Bilirubin: 0.5 mg/dL (ref 0.2–1.2)
Total Protein: 6.6 g/dL (ref 6.0–8.3)

## 2021-04-03 LAB — IRON: Iron: 69 ug/dL (ref 42–145)

## 2021-04-03 LAB — VITAMIN B12: Vitamin B-12: 481 pg/mL (ref 211–911)

## 2021-04-03 LAB — TSH: TSH: 1.35 u[IU]/mL (ref 0.35–5.50)

## 2021-04-03 MED ORDER — GABAPENTIN 300 MG PO CAPS: ORAL_CAPSULE | ORAL | 5 refills | Status: DC

## 2021-04-03 NOTE — Patient Instructions (Signed)
Go to the lab on the way out.   If you have mychart we'll likely use that to update you.    ?Try taking 3 gabapentin at night.  Let me know if that isn't helping.  ?Take care.  Glad to see you. ?

## 2021-04-03 NOTE — Progress Notes (Signed)
This visit occurred during the SARS-CoV-2 public health emergency.  Safety protocols were in place, including screening questions prior to the visit, additional usage of staff PPE, and extensive cleaning of exam room while observing appropriate contact time as indicated for disinfecting solutions. ? ?RLS sx.  More pain and numbness in the legs.  Recent death of daughter noted and discussed.  Her neuropathy sx are worse in the BLE and she thought that was contributing.  She is having more pain and numbness in the BLE but no weakness, no foot drop.  No ADE on meds.  She usually got relief from PM meds until about 5 AM but now meds seem to be wearing out sooner.  She still taking gabapentin and Requip at baseline. ? ?Meds, vitals, and allergies reviewed.  ? ?ROS: Per HPI unless specifically indicated in ROS section  ? ?GEN: nad, alert and oriented, tearful but regains composure.  ?HEENT: ncat ?NECK: supple w/o LA ?CV: rrr.  ?PULM: ctab, no inc wob ?ABD: soft, +bs ?EXT: no edema ?SKIN: well perfused.  ? ? ?25 minutes were devoted to patient care in this encounter (this includes time spent reviewing the patient's file/history, interviewing and examining the patient, counseling/reviewing plan with patient).  ? ?

## 2021-04-05 NOTE — Assessment & Plan Note (Signed)
I suspect the death of her daughter has to contribute to her symptoms.  Discussed.  Reasonable to check routine labs today.  Reasonable to try taking 3 gabapentin at night.  She can let me know if that isn't helping.   ?

## 2021-04-12 ENCOUNTER — Encounter: Payer: Self-pay | Admitting: Family Medicine

## 2021-04-15 ENCOUNTER — Telehealth: Payer: Self-pay | Admitting: Family Medicine

## 2021-04-15 NOTE — Telephone Encounter (Signed)
Please check with patient.  See my chart message about her leg cramps.  See if they are exertional.  See what other details she can get.  See if any recent medication change had any effect.  I would not start taking extra potassium yet since her most recent potassium level was normal.  Please let me know about any details you get.  Thanks. ?

## 2021-04-15 NOTE — Telephone Encounter (Signed)
The gabapentin dose is the specific issue we may need to address.  See if she is taking 300 mg total at night or if she is taking a single 300 mg tablet.  If she is only taking 300 mg total at night then I would try taking 600 mg at night.  If she is already taking a dose higher than 300 mg at night then let me know.  Thanks. ?

## 2021-04-15 NOTE — Telephone Encounter (Signed)
Patient states the leg cramps mainly happen at night when she's sleeping. She already takes gabapentin 300 mg every night. Patient has not had any recent medication changes. I advised her not to take any extra potassium as instructed.  ?

## 2021-04-16 NOTE — Telephone Encounter (Signed)
lmtcb

## 2021-04-17 MED ORDER — ROPINIROLE HCL 2 MG PO TABS: 3.0000 mg | ORAL_TABLET | Freq: Every evening | ORAL | Status: DC | PRN

## 2021-04-17 NOTE — Telephone Encounter (Signed)
Called patient and advised about medication directions. Patient verbalized understanding.  ?

## 2021-04-17 NOTE — Telephone Encounter (Signed)
I would leave gabapentin as is and try inc in requip to '3mg'$  at night, ie 1.5 of the '2mg'$  tabs.  Let me know if that isn't helping.  Thanks.  ?

## 2021-04-17 NOTE — Addendum Note (Signed)
Addended by: Tonia Ghent on: 04/17/2021 02:13 PM ? ? Modules accepted: Orders ? ?

## 2021-04-17 NOTE — Telephone Encounter (Signed)
Patient states she is taking 3 capsules of 300 mg gabapentin every night and then 1 capsule of 300 mg in the mornings.  ?

## 2021-05-18 ENCOUNTER — Other Ambulatory Visit: Payer: Self-pay | Admitting: Family Medicine

## 2021-05-18 NOTE — Telephone Encounter (Signed)
Refill request for GABAPENTIN 300 MG CAPSULE ? ?LOV - 04/03/21 ?Next OV - not scheduled ?Last refill - 04/03/21 #120/5 ? ?

## 2021-06-08 DIAGNOSIS — H353132 Nonexudative age-related macular degeneration, bilateral, intermediate dry stage: Secondary | ICD-10-CM | POA: Diagnosis not present

## 2021-06-08 DIAGNOSIS — H353221 Exudative age-related macular degeneration, left eye, with active choroidal neovascularization: Secondary | ICD-10-CM | POA: Diagnosis not present

## 2021-06-19 ENCOUNTER — Encounter: Payer: Self-pay | Admitting: Family Medicine

## 2021-06-19 ENCOUNTER — Ambulatory Visit (INDEPENDENT_AMBULATORY_CARE_PROVIDER_SITE_OTHER): Payer: Medicare Other | Admitting: Family Medicine

## 2021-06-19 DIAGNOSIS — K148 Other diseases of tongue: Secondary | ICD-10-CM | POA: Diagnosis not present

## 2021-06-19 DIAGNOSIS — G2581 Restless legs syndrome: Secondary | ICD-10-CM

## 2021-06-19 MED ORDER — ROPINIROLE HCL 2 MG PO TABS: 4.0000 mg | ORAL_TABLET | Freq: Every evening | ORAL | Status: DC | PRN

## 2021-06-19 MED ORDER — TRIAMCINOLONE ACETONIDE 0.1 % MT PSTE: PASTE | OROMUCOSAL | 0 refills | Status: DC

## 2021-06-19 NOTE — Patient Instructions (Addendum)
Please ask the front about a record release from [facility]   Try taking '4mg'$  requip at night.  Let me know if that isn't helping.  Try triamcinolone/orabase on the tongue spot.  If not better, then let me know and we'll refer to ENT.  Take care.  Glad to see you.

## 2021-06-19 NOTE — Progress Notes (Signed)
Tongue lesion present for about 2-3 weeks.  Using warm salt water.  It gets in the way, on the tip of the tongue.  Unclear if there was a trigger, ie after eating hot food. No other oral lesion.  Not enlarging or draining but not getting better.  No FCNAVD except for some nausea this AM.  Resolved in the meantime.    We talked about anxiety and "my nerves."  Sig stressors with her daughter's death, has been working a lot at home with yard work.  Her RLS sx are worse in the meantime, she attributed that to recent stressors.    She had seen [facility].  See after visit summary. Meds, vitals, and allergies reviewed.   ROS: Per HPI unless specifically indicated in ROS section   GEN: nad, alert and oriented HEENT: mucous membranes moist, she has a very small <<1 cm whitish papule on the dorsal tip of the tongue without ulceration drainage or surrounding erythema.  No other lesions in the mouth.  No other ulcerations or leukoplakia.  No exudates.  No other abnormality seen on the gum lines or in the oral cavity. NECK: supple w/o LA CV: rrr.  PULM: ctab, no inc wob

## 2021-06-21 DIAGNOSIS — K148 Other diseases of tongue: Secondary | ICD-10-CM | POA: Insufficient documentation

## 2021-06-21 NOTE — Assessment & Plan Note (Addendum)
Reasonable to treat RLS with a higher dose of Requip, 4 mg at night.  If that does not work she can let me know.  We talked about concurrent treatment for anxiety to see if that would help.  Still okay for outpatient follow-up.  She can see how she feels on the higher dose of Requip and then let me know if she needs a new prescription sent.

## 2021-06-21 NOTE — Assessment & Plan Note (Signed)
This does not look cancerous and it may be benign irritation.  Discussed options. Try triamcinolone/orabase on the area.  If not better, then she can let me know and we'll refer to ENT.

## 2021-06-23 ENCOUNTER — Encounter: Payer: Self-pay | Admitting: Family Medicine

## 2021-06-25 ENCOUNTER — Other Ambulatory Visit: Payer: Self-pay | Admitting: Family Medicine

## 2021-06-25 MED ORDER — ROPINIROLE HCL 4 MG PO TABS: 4.0000 mg | ORAL_TABLET | Freq: Every evening | ORAL | 3 refills | Status: DC | PRN

## 2021-06-26 ENCOUNTER — Other Ambulatory Visit: Payer: Self-pay | Admitting: Family Medicine

## 2021-06-26 MED ORDER — ESCITALOPRAM OXALATE 5 MG PO TABS: 5.0000 mg | ORAL_TABLET | Freq: Every day | ORAL | 1 refills | Status: DC

## 2021-06-30 ENCOUNTER — Other Ambulatory Visit: Payer: Self-pay | Admitting: Family Medicine

## 2021-06-30 NOTE — Telephone Encounter (Signed)
Refill request for diazePAM 5 MG TABLET  LOV - 06/19/21 Next OV - not scheduled Last refill - 10/16/20 #30/1

## 2021-07-01 NOTE — Telephone Encounter (Signed)
Sent. Thanks.   

## 2021-07-13 ENCOUNTER — Encounter: Payer: Self-pay | Admitting: Family Medicine

## 2021-07-13 DIAGNOSIS — H353221 Exudative age-related macular degeneration, left eye, with active choroidal neovascularization: Secondary | ICD-10-CM | POA: Diagnosis not present

## 2021-07-14 ENCOUNTER — Other Ambulatory Visit: Payer: Self-pay | Admitting: Family Medicine

## 2021-07-14 MED ORDER — GABAPENTIN 300 MG PO CAPS: ORAL_CAPSULE | ORAL | 3 refills | Status: DC

## 2021-07-26 ENCOUNTER — Encounter: Payer: Self-pay | Admitting: Family Medicine

## 2021-07-27 MED ORDER — ROPINIROLE HCL 4 MG PO TABS: 4.0000 mg | ORAL_TABLET | Freq: Every evening | ORAL | 3 refills | Status: DC | PRN

## 2021-07-31 DIAGNOSIS — Z1231 Encounter for screening mammogram for malignant neoplasm of breast: Secondary | ICD-10-CM | POA: Diagnosis not present

## 2021-08-21 ENCOUNTER — Other Ambulatory Visit: Payer: Self-pay | Admitting: Family Medicine

## 2021-08-25 DIAGNOSIS — H353221 Exudative age-related macular degeneration, left eye, with active choroidal neovascularization: Secondary | ICD-10-CM | POA: Diagnosis not present

## 2021-09-08 ENCOUNTER — Other Ambulatory Visit: Payer: Self-pay | Admitting: Family Medicine

## 2021-09-08 MED ORDER — ROPINIROLE HCL 4 MG PO TABS: 2.0000 mg | ORAL_TABLET | Freq: Two times a day (BID) | ORAL | Status: DC

## 2021-09-08 NOTE — Progress Notes (Signed)
D/w pt about RLS sx and could try taking requip '2mg'$  BID in the meantime to see if that would help with daytime sx.  That isn't an increase in total daily dose.  She can update me as needed.

## 2021-09-15 ENCOUNTER — Other Ambulatory Visit: Payer: Self-pay | Admitting: Family Medicine

## 2021-10-11 ENCOUNTER — Other Ambulatory Visit: Payer: Self-pay | Admitting: Family Medicine

## 2021-10-11 DIAGNOSIS — G2581 Restless legs syndrome: Secondary | ICD-10-CM

## 2021-10-11 DIAGNOSIS — E039 Hypothyroidism, unspecified: Secondary | ICD-10-CM

## 2021-10-11 DIAGNOSIS — E782 Mixed hyperlipidemia: Secondary | ICD-10-CM

## 2021-10-11 DIAGNOSIS — E559 Vitamin D deficiency, unspecified: Secondary | ICD-10-CM

## 2021-10-12 ENCOUNTER — Ambulatory Visit (INDEPENDENT_AMBULATORY_CARE_PROVIDER_SITE_OTHER): Payer: Medicare Other | Admitting: Family Medicine

## 2021-10-12 ENCOUNTER — Encounter: Payer: Self-pay | Admitting: Family Medicine

## 2021-10-12 VITALS — BP 140/60 | HR 71 | Temp 97.9°F | Ht 68.0 in | Wt 207.5 lb

## 2021-10-12 DIAGNOSIS — L97319 Non-pressure chronic ulcer of right ankle with unspecified severity: Secondary | ICD-10-CM

## 2021-10-12 DIAGNOSIS — G2581 Restless legs syndrome: Secondary | ICD-10-CM | POA: Diagnosis not present

## 2021-10-12 DIAGNOSIS — L03115 Cellulitis of right lower limb: Secondary | ICD-10-CM

## 2021-10-12 DIAGNOSIS — E559 Vitamin D deficiency, unspecified: Secondary | ICD-10-CM | POA: Diagnosis not present

## 2021-10-12 DIAGNOSIS — I83013 Varicose veins of right lower extremity with ulcer of ankle: Secondary | ICD-10-CM

## 2021-10-12 DIAGNOSIS — E039 Hypothyroidism, unspecified: Secondary | ICD-10-CM | POA: Diagnosis not present

## 2021-10-12 MED ORDER — DOXYCYCLINE HYCLATE 100 MG PO TABS: 100.0000 mg | ORAL_TABLET | Freq: Two times a day (BID) | ORAL | 0 refills | Status: DC

## 2021-10-12 NOTE — Progress Notes (Signed)
[name] T. Kim Rothery, MD, [place] at [facility] [place], [address]  Phone: [phone]  FAX: [phone]  [name] - 81 y.o. female  MRN [id number]  Date of Birth: May 24, 1940  Date: 10/12/2021  PCP: Tonia Ghent, MD  Referral: Tonia Ghent, MD  Chief Complaint  Patient presents with   Sore on RIght Heel   Subjective:   [name] is a 81 y.o. very pleasant female patient with Body mass index is 31.55 kg/m. who presents with the following:  Sore on her R heel has been hurting for about one week.  She had no specific injury, she has developed a small wound on the posteromedial aspect of the ankle, now this has become progressively more tender over the last week.  It is tender to touch around it, and is developed some slight amount of warmth.  There is no pus.  She does have known significant venous insufficiency, and she has had similar issues in the past, she is already established with the wound care clinic.  Has an appointment with wound clinic next week.   Venous ulcer with cellulitis  Review of Systems is noted in the HPI, as appropriate  Objective:   BP (!) 140/60   Pulse 71   Temp 97.9 F (36.6 C) (Oral)   Ht '5\' 8"'$  (1.727 m)   Wt 207 lb 8 oz (94.1 kg)   SpO2 94%   BMI 31.55 kg/m   GEN: No acute distress; alert,appropriate. PULM: Breathing comfortably in no respiratory distress PSYCH: Normally interactive.     Surrounding the small wound is mildly pink in appearance and tender to touch.  Laboratory and Imaging Data:  Assessment and Plan:     ICD-10-CM   1. Venous ulcer of ankle, right ([place])  I83.013    L97.319     2. Restless leg syndrome  G25.81 Iron    3. Vitamin D deficiency  E55.9 VITAMIN D 25 Hydroxy (Vit-D Deficiency, Fractures)    TSH    CBC with Differential/Platelet    Lipid panel    Comprehensive metabolic panel    4. Hypothyroidism, unspecified  type  E03.9 VITAMIN D 25 Hydroxy (Vit-D Deficiency, Fractures)    TSH    CBC with Differential/Platelet    Lipid panel    Comprehensive metabolic panel    5. Cellulitis of right ankle  L03.115      Patient presents with a small venous ulcer on the right medial ankle, and this is now developed some warmth and pain adjacent to the area.  I worried that this has developed some cellulitis, and I am going to treat the patient with some oral doxycycline.  She is planning on seeing wound management next week, and she has already arranged for an appointment.  Medication Management during today's office visit: Meds ordered this encounter  Medications   doxycycline (VIBRA-TABS) 100 MG tablet    Sig: Take 1 tablet (100 mg total) by mouth 2 (two) times daily.    Dispense:  20 tablet    Refill:  0   There are no discontinued medications.  Orders placed today for conditions managed today: No orders of the defined types were placed in this encounter.   Disposition: No follow-ups on file.  [employer] One speech-to-text software was used for transcription in this dictation.  Possible transcriptional errors can occur using Editor, commissioning.   Signed,  Maud Deed. Kelsie Zaborowski, MD  Outpatient Encounter Medications as of 10/12/2021  Medication Sig   albuterol (VENTOLIN HFA) 108 (90 Base) MCG/ACT inhaler Inhale 1-2 puffs into the lungs every 8 (eight) hours as needed for wheezing (or for cough). Okay to fill with proair/ventolin/albuterol   atenolol (TENORMIN) 50 MG tablet Take 1 tablet (50 mg total) by mouth daily.   butalbital-acetaminophen-caffeine (FIORICET) 50-325-40 MG tablet TAKE ONE TABLET BY MOUTH TWICE A DAY AS NEEDED FOR FOR HEADACHE   Cholecalciferol (VITAMIN D) 50 MCG (2000 UT) CAPS Take 1 capsule (2,000 Units total) by mouth daily.   diazepam (VALIUM) 5 MG tablet TAKE 1/2 TO 1 TABLET BY MOUTH EVERY 12 HOURS AS NEEDED FOR ANXIETY   doxycycline (VIBRA-TABS) 100 MG tablet Take 1 tablet  (100 mg total) by mouth 2 (two) times daily.   escitalopram (LEXAPRO) 5 MG tablet Take 1 tablet (5 mg total) by mouth daily.   FEROSUL 325 (65 Fe) MG tablet TAKE ONE TABLET BY MOUTH EVERY OTHER DAY   fluocinonide-emollient (LIDEX-E) 0.05 % cream Apply 1 application topically 2 (two) times daily.   gabapentin (NEURONTIN) 300 MG capsule Take up to 5 tabs a day for RLS (1 in AM, 1 midday, 3 in PM),   ibuprofen (ADVIL) 800 MG tablet TAKE ONE TABLET BY MOUTH EVERY 8 HOURS AS NEEDED FOR MODERATE PAIN.  TAKE WITH FOOD   levothyroxine (SYNTHROID) 100 MCG tablet 1 a day except for 1/2 tab a day on Sundays   Multiple Vitamin (MULTIVITAMIN) tablet Take 1 tablet by mouth daily.   omeprazole (PRILOSEC) 20 MG capsule Take 1 capsule (20 mg total) by mouth daily.   rOPINIRole (REQUIP) 4 MG tablet Take 0.5 tablets (2 mg total) by mouth in the morning and at bedtime.   simvastatin (ZOCOR) 40 MG tablet TAKE ONE TABLET BY MOUTH EVERY NIGHT AT BEDTIME   triamcinolone (KENALOG) 0.1 % paste APPLY A SMALL AMOUNT TO THE TONGUE LESION TWO TIMES A DAY FOR 10 DAYS   No facility-administered encounter medications on file as of 10/12/2021.

## 2021-10-13 ENCOUNTER — Other Ambulatory Visit: Payer: Medicare Other

## 2021-10-13 LAB — LDL CHOLESTEROL, DIRECT: Direct LDL: 90 mg/dL

## 2021-10-13 LAB — COMPREHENSIVE METABOLIC PANEL
ALT: 42 U/L — ABNORMAL HIGH (ref 0–35)
AST: 33 U/L (ref 0–37)
Albumin: 4.2 g/dL (ref 3.5–5.2)
Alkaline Phosphatase: 120 U/L — ABNORMAL HIGH (ref 39–117)
BUN: 8 mg/dL (ref 6–23)
CO2: 30 mEq/L (ref 19–32)
Calcium: 9.3 mg/dL (ref 8.4–10.5)
Chloride: 103 mEq/L (ref 96–112)
Creatinine, Ser: 0.74 mg/dL (ref 0.40–1.20)
GFR: 76.09 mL/min (ref >60.00)
Glucose, Bld: 86 mg/dL (ref 70–99)
Potassium: 4.8 mEq/L (ref 3.5–5.1)
Sodium: 139 mEq/L (ref 135–145)
Total Bilirubin: 0.5 mg/dL (ref 0.2–1.2)
Total Protein: 7 g/dL (ref 6.0–8.3)

## 2021-10-13 LAB — CBC WITH DIFFERENTIAL/PLATELET
Basophils Absolute: 0.1 10*3/uL (ref 0.0–0.1)
Basophils Relative: 1.2 % (ref 0.0–3.0)
Eosinophils Absolute: 0.3 10*3/uL (ref 0.0–0.7)
Eosinophils Relative: 3.6 % (ref 0.0–5.0)
HCT: 38.2 % (ref 36.0–46.0)
Hemoglobin: 13 g/dL (ref 12.0–15.0)
Lymphocytes Relative: 27.3 % (ref 12.0–46.0)
Lymphs Abs: 2 10*3/uL (ref 0.7–4.0)
MCHC: 34 g/dL (ref 30.0–36.0)
MCV: 96.5 fl (ref 78.0–100.0)
Monocytes Absolute: 0.7 10*3/uL (ref 0.1–1.0)
Monocytes Relative: 8.8 % (ref 3.0–12.0)
Neutro Abs: 4.4 10*3/uL (ref 1.4–7.7)
Neutrophils Relative %: 59.1 % (ref 43.0–77.0)
Platelets: 267 10*3/uL (ref 150.0–400.0)
RBC: 3.96 Mil/uL (ref 3.87–5.11)
RDW: 12.9 % (ref 11.5–15.5)
WBC: 7.5 10*3/uL (ref 4.0–10.5)

## 2021-10-13 LAB — LIPID PANEL
Cholesterol: 172 mg/dL (ref 0–200)
HDL: 46.2 mg/dL (ref >39.00)
NonHDL: 125.74
Total CHOL/HDL Ratio: 4
Triglycerides: 230 mg/dL — ABNORMAL HIGH (ref 0.0–149.0)
VLDL: 46 mg/dL — ABNORMAL HIGH (ref 0.0–40.0)

## 2021-10-13 LAB — VITAMIN D 25 HYDROXY (VIT D DEFICIENCY, FRACTURES): VITD: 46.85 ng/mL (ref 30.00–100.00)

## 2021-10-13 LAB — TSH: TSH: 0.84 u[IU]/mL (ref 0.35–5.50)

## 2021-10-13 LAB — IRON: Iron: 65 ug/dL (ref 42–145)

## 2021-10-14 ENCOUNTER — Other Ambulatory Visit: Payer: Medicare Other

## 2021-10-19 ENCOUNTER — Other Ambulatory Visit: Payer: Self-pay | Admitting: Family Medicine

## 2021-10-19 DIAGNOSIS — H353221 Exudative age-related macular degeneration, left eye, with active choroidal neovascularization: Secondary | ICD-10-CM | POA: Diagnosis not present

## 2021-10-20 ENCOUNTER — Ambulatory Visit (INDEPENDENT_AMBULATORY_CARE_PROVIDER_SITE_OTHER): Payer: Medicare Other | Admitting: Family Medicine

## 2021-10-20 ENCOUNTER — Encounter: Payer: Self-pay | Admitting: Family Medicine

## 2021-10-20 VITALS — BP 122/78 | HR 79 | Temp 97.6°F | Ht 68.0 in | Wt 209.0 lb

## 2021-10-20 DIAGNOSIS — E039 Hypothyroidism, unspecified: Secondary | ICD-10-CM | POA: Diagnosis not present

## 2021-10-20 DIAGNOSIS — G2581 Restless legs syndrome: Secondary | ICD-10-CM | POA: Diagnosis not present

## 2021-10-20 DIAGNOSIS — Z0000 Encounter for general adult medical examination without abnormal findings: Secondary | ICD-10-CM

## 2021-10-20 DIAGNOSIS — E782 Mixed hyperlipidemia: Secondary | ICD-10-CM | POA: Diagnosis not present

## 2021-10-20 DIAGNOSIS — R7989 Other specified abnormal findings of blood chemistry: Secondary | ICD-10-CM | POA: Diagnosis not present

## 2021-10-20 DIAGNOSIS — Z7189 Other specified counseling: Secondary | ICD-10-CM

## 2021-10-20 DIAGNOSIS — F419 Anxiety disorder, unspecified: Secondary | ICD-10-CM | POA: Diagnosis not present

## 2021-10-20 DIAGNOSIS — I83019 Varicose veins of right lower extremity with ulcer of unspecified site: Secondary | ICD-10-CM

## 2021-10-20 MED ORDER — ESCITALOPRAM OXALATE 5 MG PO TABS: 5.0000 mg | ORAL_TABLET | Freq: Every day | ORAL | 3 refills | Status: DC

## 2021-10-20 MED ORDER — OMEPRAZOLE 20 MG PO CPDR: 20.0000 mg | DELAYED_RELEASE_CAPSULE | Freq: Every day | ORAL | 3 refills | Status: DC

## 2021-10-20 MED ORDER — GABAPENTIN 300 MG PO CAPS: ORAL_CAPSULE | ORAL | 5 refills | Status: DC

## 2021-10-20 MED ORDER — ATENOLOL 50 MG PO TABS: 50.0000 mg | ORAL_TABLET | Freq: Every day | ORAL | 3 refills | Status: DC

## 2021-10-20 MED ORDER — ROPINIROLE HCL 2 MG PO TABS: 2.0000 mg | ORAL_TABLET | Freq: Two times a day (BID) | ORAL | 3 refills | Status: DC

## 2021-10-20 NOTE — Progress Notes (Unsigned)
I have personally reviewed the Medicare Annual Wellness questionnaire and have noted 1. The patient's medical and social history 2. Their use of alcohol, tobacco or illicit drugs 3. Their current medications and supplements 4. The patient's functional ability including ADL's, fall risks, home safety risks and hearing or visual             impairment. 5. Diet and physical activities 6. Evidence for depression or mood disorders  The patients weight, height, BMI have been recorded in the chart and visual acuity is per eye clinic.  I have made referrals, counseling and provided education to the patient based review of the above and I have provided the pt with a written personalized care plan for preventive services.  Provider list updated- see scanned forms.  Routine anticipatory guidance given to patient.  See health maintenance. The possibility exists that previously documented standard health maintenance information may have been brought forward from a previous encounter into this note.  If needed, that same information has been updated to reflect the current situation based on today's encounter.    Flu 10/15/21 Shingles PNA Tetanus Colon  Breast cancer screening Prostate cancer screening Advance directive Cognitive function addressed- see scanned forms- and if abnormal then additional documentation follows.   She had RSV vaccine at pharmacy 10/15/21.    In addition to [facility], follow up visit for the below conditions:  LFT elevation d/w pt.  She had been using compression stockings for venous ulcer on R ankle.  Still with discomfort.  Started on doxy in the meantime.  Lesion is less red now.  She has wound f/u pending for tomorrow.  Still locally sore/stings.      Mood d/w pt.  D/w pt about loss of her daughter.  Condolences offered.  She is trying to work through the situation, d/w pt.   She had eye clinic f/u.    Hypothyroidism.  No neck mass.  Swallowing well.    Elevated Cholesterol: Using medications without problems: yes Muscle aches: no diffuse aches, but h/o back pain at baseline.   Diet compliance: d/w pt.   Exercise: d/w pt.   Still on iron.  Labs d/w pt.    Higher dose of gabapentin helped with nighttime sx.  She had trouble cutting requip in half.  D/w pt about rx change, to '2mg'$  BID.     D/w pt about potentially seeing back clinic.  She'll update me as needed.  She has chronic back pain at baseline.   PMH and SH reviewed  Meds, vitals, and allergies reviewed.   ROS: Per HPI.  Unless specifically indicated otherwise in HPI, the patient denies:  General: fever. Eyes: acute vision changes ENT: sore throat Cardiovascular: chest pain Respiratory: SOB GI: vomiting GU: dysuria Musculoskeletal: acute back pain Derm: acute rash Neuro: acute motor dysfunction Psych: worsening mood Endocrine: polydipsia Heme: bleeding Allergy: hayfever  GEN: nad, alert and oriented HEENT: mucous membranes moist NECK: supple w/o LA CV: rrr. PULM: ctab, no inc wob ABD: soft, +bs EXT: no edema SKIN: no acute rash In compression stockings.

## 2021-10-20 NOTE — Patient Instructions (Signed)
We'll call about the ultrasound.  We'll go from there.  Update me as needed.  Take care.  Glad to see you.

## 2021-10-21 ENCOUNTER — Other Ambulatory Visit: Payer: Self-pay | Admitting: Family Medicine

## 2021-10-21 ENCOUNTER — Encounter (HOSPITAL_BASED_OUTPATIENT_CLINIC_OR_DEPARTMENT_OTHER): Payer: Medicare Other | Attending: Physician Assistant | Admitting: Physician Assistant

## 2021-10-21 DIAGNOSIS — I776 Arteritis, unspecified: Secondary | ICD-10-CM | POA: Insufficient documentation

## 2021-10-21 DIAGNOSIS — I10 Essential (primary) hypertension: Secondary | ICD-10-CM | POA: Insufficient documentation

## 2021-10-21 DIAGNOSIS — I87331 Chronic venous hypertension (idiopathic) with ulcer and inflammation of right lower extremity: Secondary | ICD-10-CM | POA: Diagnosis not present

## 2021-10-21 DIAGNOSIS — L97312 Non-pressure chronic ulcer of right ankle with fat layer exposed: Secondary | ICD-10-CM | POA: Diagnosis not present

## 2021-10-21 DIAGNOSIS — I872 Venous insufficiency (chronic) (peripheral): Secondary | ICD-10-CM | POA: Diagnosis not present

## 2021-10-21 DIAGNOSIS — R7989 Other specified abnormal findings of blood chemistry: Secondary | ICD-10-CM | POA: Insufficient documentation

## 2021-10-21 NOTE — Assessment & Plan Note (Signed)
Higher dose of gabapentin helped with nighttime sx.  She had trouble cutting requip in half, the pill would crumble.  D/w pt about rx change, to '2mg'$  BID.

## 2021-10-21 NOTE — Assessment & Plan Note (Signed)
LFT elevation d/w pt. this could be due to fatty liver but discussed getting ultrasound done to evaluate this further.  She is not having abdominal symptoms, abdominal pain, jaundice, etc.

## 2021-10-21 NOTE — Assessment & Plan Note (Signed)
Advance directive- husband designated if patient were incapacitated.  

## 2021-10-21 NOTE — Assessment & Plan Note (Signed)
Can use a compression stocking and follow-up with wound clinic.  Finish Doxy prescription.

## 2021-10-21 NOTE — Assessment & Plan Note (Signed)
Continue simvastatin.  Labs discussed with patient. 

## 2021-10-21 NOTE — Assessment & Plan Note (Signed)
Flu 10/15/21 Shingles previously done PNA previously done Tetanus 2015 RSV vaccine previously done COVID-vaccine previously done Colon cancer screening not due given her age. Breast cancer screening 2023 Bone density test 2020 Advance directive-husband designated if patient were incapacitated. Cognitive function addressed- see scanned forms- and if abnormal then additional documentation follows.

## 2021-10-21 NOTE — Assessment & Plan Note (Signed)
Continue levothyroxine 

## 2021-10-21 NOTE — Assessment & Plan Note (Signed)
Mood d/w pt.  D/w pt about loss of her daughter.  Condolences offered.  She is trying to work through the situation, d/w pt. continue Lexapro with as needed diazepam.

## 2021-10-22 ENCOUNTER — Other Ambulatory Visit: Payer: Self-pay | Admitting: Family Medicine

## 2021-10-22 DIAGNOSIS — L97312 Non-pressure chronic ulcer of right ankle with fat layer exposed: Secondary | ICD-10-CM | POA: Diagnosis not present

## 2021-10-22 NOTE — Progress Notes (Signed)
**Note Kim-Identified via Obfuscation** [name] ([id number]) . Visit Report for 10/21/2021 Abuse Risk Screen Details Patient Name: Date of Service: [name], [name]. 10/21/2021 8:00 A M Medical Record Number: [id number] Patient Account Number: [id number] Date of Birth/Sex: Treating RN: 1940/11/17 (81 y.o. [name] Primary Care [name]: [name] Other Clinician: Referring [name]: Treating [name]/Extender: [name]: 0 Abuse Risk Screen Items Answer ABUSE RISK SCREEN: Has anyone close to you tried to hurt or harm you recentlyo No Do you feel uncomfortable with anyone in your familyo No Has anyone forced you do things that you didnt want to doo No Electronic Signature(s) Signed: 10/22/2021 3:46:02 PM By: Kim Hammock RN Entered By: [name] on 10/21/2021 08:05:10 -------------------------------------------------------------------------------- Activities of Daily Living Details Patient Name: Date of Service: [name]. 10/21/2021 8:00 A M Medical Record Number: [id number] Patient Account Number: [id number] Date of Birth/Sex: Treating RN: 15-Jan-1941 (81 y.o. [name] Primary Care Nehal Shives: [name] Other Clinician: Referring Jerusalem Wert: Treating [name]/Extender: [name]: 0 Activities of Daily Living Items Answer Activities of Daily Living (Please select one for each item) Drive Automobile Completely Able [name] Medications ake Completely Able Use [name] elephone Completely Able Care for Appearance Completely Able Use [name] oilet Completely Able Bath / Shower Completely Able Dress Self Completely Able Feed Self Completely Able Walk Completely Able Get In / Out Bed Completely Able Housework Completely Able Prepare Meals Completely [place] Completely Able Shop for Self Completely Able Electronic Signature(s) Signed: 10/22/2021 3:46:02 PM By:  Kim Hammock RN Entered By: [name] on 10/21/2021 08:05:31 -------------------------------------------------------------------------------- Education Screening Details Patient Name: Date of Service: [name], [name]. 10/21/2021 8:00 A M Medical Record Number: [id number] Patient Account Number: [id number] Date of Birth/Sex: Treating RN: Feb 28, 1940 (81 y.o. [name] Primary Care Chibuike Fleek: [name] Other Clinician: Referring [name]: Treating [name]: [name]: 0 Primary Learner Assessed: Patient Learning Preferences/Education Level/Primary Language Learning Preference: Explanation, Demonstration, Communication Board, Printed Material Highest Education Level: High School Preferred Language: English Cognitive Barrier Language Barrier: No Translator Needed: No Memory Deficit: No Emotional Barrier: No Cultural/Religious Beliefs Affecting Medical Care: No Physical Barrier Impaired Vision: Yes Glasses Impaired Hearing: No Decreased Hand dexterity: No Knowledge/Comprehension Knowledge Level: High Comprehension Level: High Ability to understand written instructions: High Ability to understand verbal instructions: High Motivation Anxiety Level: Calm Cooperation: Cooperative Education Importance: Denies Need Interest in Health Problems: Asks Questions Perception: Coherent Willingness to Engage in Self-Management High Activities: Readiness to Engage in Self-Management High Activities: Electronic Signature(s) Signed: 10/22/2021 3:46:02 PM By: Kim Hammock RN Entered By: [name] on 10/21/2021 08:05:55 -------------------------------------------------------------------------------- Fall Risk Assessment Details Patient Name: Date of Service: [name], [name]. 10/21/2021 8:00 A M Medical Record Number: [id number] Patient Account Number: [id number] Date of  Birth/Sex: Treating RN: 21-May-1940 (81 y.o. [name] Primary Care Damyn Weitzel: [name] Other Clinician: Referring [name]: Treating [name]: [name]: 0 Fall Risk Assessment Items Have you had 2 or more falls in the last 12 monthso 0 No Have you had any fall that resulted in injury in the last 12 monthso 0 No FALLS RISK SCREEN History of falling - immediate or within 3 months 0 No Secondary diagnosis (Do you have 2 or more medical diagnoseso) 0 No Ambulatory aid None/bed rest/wheelchair/nurse 0 No Crutches/cane/walker 0  No Furniture 0 No Intravenous therapy Access/Saline/Heparin Lock 0 No Gait/Transferring Normal/ bed rest/ wheelchair 0 No Weak (short steps with or without shuffle, stooped but able to lift head while walking, may seek 0 No support from furniture) Impaired (short steps with shuffle, may have difficulty arising from chair, head down, impaired 0 No balance) Mental Status Oriented to own ability 0 No Electronic Signature(s) Signed: 10/22/2021 3:46:02 PM By: Kim Hammock RN Entered By: [name] on 10/21/2021 08:06:01 -------------------------------------------------------------------------------- Foot Assessment Details Patient Name: Date of Service: [name], [name]. 10/21/2021 8:00 A M Medical Record Number: [id number] Patient Account Number: [id number] Date of Birth/Sex: Treating RN: 1940/05/20 (81 y.o. [name] Primary Care [name]: [name] Other Clinician: Referring [name]: Treating [name]/Extender: [name]: 0 Foot Assessment Items Site Locations + = Sensation present, - = Sensation absent, C = Callus, U = Ulcer R = Redness, W = Warmth, M = Maceration, PU = Pre-ulcerative lesion F = Fissure, S = Swelling, D = Dryness Assessment Right: Left: Other Deformity: No No Prior Foot Ulcer: No  No Prior Amputation: No No Charcot Joint: No No Ambulatory Status: Ambulatory Without Help Gait: Steady Electronic Signature(s) Signed: 10/22/2021 3:46:02 PM By: Kim Hammock RN Entered By: [name] on 10/21/2021 08:08:58 -------------------------------------------------------------------------------- Nutrition Risk Screening Details Patient Name: Date of Service: [name], [name]. 10/21/2021 8:00 A M Medical Record Number: [id number] Patient Account Number: [id number] Date of Birth/Sex: Treating RN: 06-Dec-1940 (81 y.o. [name] Primary Care Zyan Mirkin: [name] Other Clinician: Referring Lil Lepage: Treating Joell Usman/Extender: [name]: 0 Height (in): Weight (lbs): Body Mass Index (BMI): Nutrition Risk Screening Items Score Screening NUTRITION RISK SCREEN: I have an illness or condition that made me change the kind and/or amount of food I eat 0 No I eat fewer than two meals per [name] 0 No I eat few fruits and vegetables, or milk products 0 No I have three or more drinks of beer, liquor or wine almost every [name] 0 No I have tooth or mouth problems that make it hard for me to eat 0 No I don'[name] always have enough money to buy the food I need 0 No I eat alone most of the time 0 No I take three or more different prescribed or over-the-counter drugs a [name] 0 No Without wanting to, I have lost or gained 10 pounds in the last six months 0 No I am not always physically able to shop, cook and/or feed myself 0 No Nutrition Protocols Good Risk Protocol 0 No interventions needed Moderate Risk Protocol High Risk Proctocol Risk Level: Good Risk Score: 0 Electronic Signature(s) Signed: 10/22/2021 3:46:02 PM By: Kim Hammock RN Entered By: [name] on 10/21/2021 08:06:13

## 2021-10-22 NOTE — Progress Notes (Signed)
[name], [name] ([id number]) . Visit Report for 10/21/2021 Chief Complaint Document Details Patient Name: Date of Service: [name], [name]. 10/21/2021 8:00 A M Medical Record Number: [id number] Patient Account Number: [id number] Date of Birth/Sex: Treating RN: December 31, 1940 (81 y.o. F) Primary Care Provider: [name] Other Clinician: Referring Provider: Treating Provider/Extender: [name] Weeks in Treatment: 0 Information Obtained from: Patient Chief Complaint Right ankle ulcer Electronic Signature(s) Signed: 10/21/2021 8:49:48 AM By: [name] PA-C Entered By: [name] on 10/21/2021 08:49:48 -------------------------------------------------------------------------------- Debridement Details Patient Name: Date of Service: [name], [name]. 10/21/2021 8:00 A M Medical Record Number: [id number] Patient Account Number: [id number] Date of Birth/Sex: Treating RN: [birth year] (81 y.o. [name], [name] Primary Care Provider: [name] Other Clinician: Referring Provider: Treating Provider/Extender: [name] Weeks in Treatment: 0 Debridement Performed for Assessment: Wound #1 Right,Medial Malleolus Performed By: Physician [name], PA Debridement Type: Debridement Severity of Tissue Pre Debridement: Fat layer exposed Level of Consciousness (Pre-procedure): Awake and Alert Pre-procedure Verification/Time Out Yes - 08:55 Taken: Start Time: 08:55 Pain Control: Lidocaine [name] Area Debrided (L x W): otal 1.5 (cm) x 1.5 (cm) = 2.25 (cm) Tissue and other material debrided: Viable, Non-Viable, Eschar, Skin: Dermis , Skin: Epidermis Level: Skin/Epidermis Debridement Description: Selective/Open Wound Instrument: Curette Bleeding: Minimum Hemostasis Achieved: Pressure End Time: 08:55 Procedural Pain: 0 Post Procedural Pain: 0 Response to Treatment: Procedure was tolerated well Level of  Consciousness (Post- Awake and Alert procedure): Post Debridement Measurements of Total Wound Length: (cm) 1.5 Width: (cm) 1.5 Depth: (cm) 0.2 Volume: (cm) 0.353 Character of Wound/Ulcer Post Debridement: Improved Severity of Tissue Post Debridement: Fat layer exposed Post Procedure Diagnosis Same as Pre-procedure Notes documented by Theophilus Bones, RN Electronic Signature(s) Signed: 10/21/2021 5:41:10 PM By: [name] PA-C Signed: 10/22/2021 3:46:02 PM By: Rhae Hammock RN Entered By: Rhae Hammock on 10/21/2021 10:03:46 -------------------------------------------------------------------------------- HPI Details Patient Name: Date of Service: [name], [name]. 10/21/2021 8:00 A M Medical Record Number: [id number] Patient Account Number: [id number] Date of Birth/Sex: Treating RN: 1940-07-06 (81 y.o. F) Primary Care Provider: [name] Other Clinician: Referring Provider: Treating Provider/Extender: [name] Weeks in Treatment: 0 History of Present Illness HPI Description: 10-21-2021 upon evaluation today patient appears to be doing poorly in regard to the wound over the medial aspect of her right ankle. This is an area that previously she has had trouble with previously. I have actually seen her in [place] about a year ago but this was on the left ankle at that time. 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 This is also the typical spot for her as it is a very difficult area to compress. Nonetheless previously we have been able to get this under control using her compression socks as well as Xeroform gauze although I am not sure that is can be the best thing for her at this time we discussed that as we proceed. 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 Electronic Signature(s) Signed: 10/21/2021  5:21:46 PM By: [name] PA-C Entered By: [name] on 10/21/2021 17:21:46 -------------------------------------------------------------------------------- Physical Exam Details Patient Name: Date of Service: [name], [name]. 10/21/2021 8:00 A M Medical Record Number: [id number] Patient Account Number: [id number] Date of Birth/Sex: Treating  RN: 1940-05-18 (81 y.o. F) Primary Care Provider: [name] Other Clinician: Referring Provider: Treating Provider/Extender: [name] Weeks in Treatment: 0 Constitutional patient is hypertensive.. pulse regular and within target range for patient.[name] respirations regular, non-labored and within target range for patient.[name] temperature within target range for patient.. Well-nourished and well-hydrated in no acute distress. Eyes conjunctiva clear no eyelid edema noted. pupils equal round and reactive to light and accommodation. Ears, Nose, Mouth, and Throat no gross abnormality of ear auricles or external auditory canals. normal hearing noted during conversation. mucus membranes moist. Respiratory normal breathing without difficulty. Cardiovascular 2+ dorsalis pedis/posterior tibialis pulses. trace pitting edema of the bilateral lower extremities. Musculoskeletal normal gait and posture. no significant deformity or arthritic changes, no loss or range of motion, no clubbing. Psychiatric this patient is able to make decisions and demonstrates good insight into disease process. Alert and Oriented x 3. pleasant and cooperative. Notes Upon inspection patient's wound bed actually showed signs of good granulation and epithelization in a lot of areas here already and in fact the area that is open is very superficial and I do not think this is going to take too much to get it to close. Fortunately I do not see any evidence of active infection locally or systemically at this time. She has been wearing her  compression stockings which I think is actually perfect. In the past we have been able to get her healed no problems with her using the stockings. Electronic Signature(s) Signed: 10/21/2021 5:22:31 PM By: [name] PA-C Entered By: [name] on 10/21/2021 17:22:31 -------------------------------------------------------------------------------- Physician Orders Details Patient Name: Date of Service: [name], [name]. 10/21/2021 8:00 A M Medical Record Number: [id number] Patient Account Number: [id number] Date of Birth/Sex: Treating RN: 12-06-1940 (81 y.o. [name], [name] Primary Care Provider: [name] Other Clinician: Referring Provider: Treating Provider/Extender: [name] Weeks in Treatment: 0 Verbal / Phone Orders: No Diagnosis Coding ICD-10 Coding Code Description I87.331 Chronic venous hypertension (idiopathic) with ulcer and inflammation of right lower extremity L97.312 Non-pressure chronic ulcer of right ankle with fat layer exposed Follow-up Appointments ppointment in 1 week. - w/ Jeri Cos and La[facility] Rm # 9 Return A Anesthetic (In clinic) Topical Lidocaine 5% applied to wound bed Bathing/ Shower/ Hygiene May shower with protection but do not get wound dressing(s) wet. Edema Control - Lymphedema / SCD / Other Elevate legs to the level of the heart or above for 30 minutes daily and/or when sitting, a frequency of: Avoid standing for long periods of time. Patient to wear own compression stockings every day. Wound Treatment Wound #1 - Malleolus Wound Laterality: Right, Medial Cleanser: Soap and Water 1 x Per BOF/75 Days Discharge Instructions: May shower and wash wound with dial antibacterial soap and water prior to dressing change. Cleanser: Wound Cleanser (DME) (Generic) 1 x Per Day/15 Days Discharge Instructions: Cleanse the wound with wound cleanser prior to applying a clean dressing using gauze sponges, not tissue  or cotton balls. Peri-Wound Care: Triamcinolone 15 (g) 1 x Per Day/15 Days Discharge Instructions: Use triamcinolone 15 (g) as directed Prim Dressing: Hydrofera Blue Ready Foam, 4x5 in (DME) (Generic) 1 x Per Day/15 Days ary Discharge Instructions: Apply to wound bed as instructed Secondary Dressing: Woven Gauze Sponge, Non-Sterile 4x4 in (DME) (Generic) 1 x Per Day/15 Days Discharge Instructions: Apply over primary dressing as directed. Secondary Dressing: Zetuvit Plus Silicone Border Dressing 5x5 (in/in) (DME) (Generic)  1 x Per Day/15 Days Discharge Instructions: Apply silicone border over primary dressing as directed. Patient Medications llergies: No Known Allergies A Notifications Medication Indication Start End lidocaine DOSE topical 5 % gel - gel topical 10/21/2021 triamcinolone acetonide DOSE topical 0.1 % ointment - ointment topical Apply a thin film to the wound bed and immediate surrounding tissue 3 times per week with each dressing change x 30 days Electronic Signature(s) Signed: 10/21/2021 9:07:05 AM By: [name] PA-C Entered By: [name] on 10/21/2021 09:07:04 -------------------------------------------------------------------------------- Problem List Details Patient Name: Date of Service: [name], [name]. 10/21/2021 8:00 A M Medical Record Number: [id number] Patient Account Number: [id number] Date of Birth/Sex: Treating RN: 01-01-1941 (81 y.o. F) Primary Care Provider: [name] Other Clinician: Referring Provider: Treating Provider/Extender: [name] Weeks in Treatment: 0 Active Problems ICD-10 Encounter Code Description Active Date MDM Diagnosis I87.331 Chronic venous hypertension (idiopathic) with ulcer and inflammation of right 10/21/2021 No Yes lower extremity L97.312 Non-pressure chronic ulcer of right ankle with fat layer exposed 10/21/2021 No Yes Inactive Problems Resolved Problems Electronic  Signature(s) Signed: 10/21/2021 8:49:30 AM By: [name] PA-C Entered By: [name] on 10/21/2021 08:49:30 -------------------------------------------------------------------------------- Progress Note Details Patient Name: Date of Service: [name], [name]. 10/21/2021 8:00 A M Medical Record Number: [id number] Patient Account Number: [id number] Date of Birth/Sex: Treating RN: [birth year] (81 y.o. F) Primary Care Provider: [name] Other Clinician: Referring Provider: Treating Provider/Extender: [name] Weeks in Treatment: 0 Subjective Chief Complaint Information obtained from Patient Right ankle ulcer History of Present Illness (HPI) 10-21-2021 upon evaluation today patient appears to be doing poorly in regard to the wound over the medial aspect of her right ankle. This is an area that previously she has had trouble with previously. I have actually seen her in [place] about a year ago but this was on the left ankle at that time. 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 This is also the typical spot for her as it is a very difficult area to compress. Nonetheless previously we have been able to get this under control using her compression socks as well as Xeroform gauze although I am not sure that is can be the best thing for her at this time we discussed that as we proceed. 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 Patient History Information obtained from Patient, Chart. Allergies No Known Allergies Family History Unknown History. Social History Never smoker, Marital Status - Married, Alcohol Use - Never, Drug Use - No History, Caffeine Use - Moderate. Medical History Eyes Denies history of Cataracts, Glaucoma, Optic Neuritis Cardiovascular Patient has history of Peripheral Venous  Disease Medical A Surgical History Notes nd Cardiovascular hyperlipidemia Endocrine hypothyroidism Review of Systems (ROS) Constitutional Symptoms (General Health) Denies complaints or symptoms of Fatigue, Fever, Chills, Marked Weight Change. Eyes Denies complaints or symptoms of Dry Eyes, Vision Changes, Glasses / Contacts. Ear/Nose/Mouth/Throat Denies complaints or symptoms of Chronic sinus problems or rhinitis. Respiratory Denies complaints or symptoms of Chronic or frequent coughs, Shortness of Breath. Gastrointestinal Denies complaints or symptoms of Frequent diarrhea, Nausea, Vomiting. Genitourinary Denies complaints or symptoms of Frequent urination. Integumentary (Skin) Complains or has symptoms of Wounds. Musculoskeletal Denies complaints or symptoms of Muscle Pain, Muscle Weakness. Neurologic Denies complaints or symptoms of Numbness/parasthesias. Psychiatric  Denies complaints or symptoms of Claustrophobia. Objective Constitutional patient is hypertensive.. pulse regular and within target range for patient.[name] respirations regular, non-labored and within target range for patient.[name] temperature within target range for patient.. Well-nourished and well-hydrated in no acute distress. Vitals Time Taken: 8:08 AM, Temperature: 98.7 F, Pulse: 74 bpm, Respiratory Rate: 17 breaths/min, Blood Pressure: 144/74 mmHg. Eyes conjunctiva clear no eyelid edema noted. pupils equal round and reactive to light and accommodation. Ears, Nose, Mouth, and Throat no gross abnormality of ear auricles or external auditory canals. normal hearing noted during conversation. mucus membranes moist. Respiratory normal breathing without difficulty. Cardiovascular 2+ dorsalis pedis/posterior tibialis pulses. trace pitting edema of the bilateral lower extremities. Musculoskeletal normal gait and posture. no significant deformity or arthritic changes, no loss or range of motion, no  clubbing. Psychiatric this patient is able to make decisions and demonstrates good insight into disease process. Alert and Oriented x 3. pleasant and cooperative. General Notes: Upon inspection patient's wound bed actually showed signs of good granulation and epithelization in a lot of areas here already and in fact the area that is open is very superficial and I do not think this is going to take too much to get it to close. Fortunately I do not see any evidence of active infection locally or systemically at this time. She has been wearing her compression stockings which I think is actually perfect. In the past we have been able to get her healed no problems with her using the stockings. Integumentary (Hair, Skin) Wound #1 status is Open. Original cause of wound was Gradually Appeared. The date acquired was: 09/30/2021. The wound is located on the Right,Medial Malleolus. The wound measures 1.5cm length x 1.5cm width x 0.2cm depth; 1.767cm^2 area and 0.353cm^3 volume. There is Fat Layer (Subcutaneous Tissue) exposed. There is no tunneling or undermining noted. There is a medium amount of serosanguineous drainage noted. The wound margin is distinct with the outline attached to the wound base. There is medium (34-66%) red, pink granulation within the wound bed. There is a medium (34-66%) amount of necrotic tissue within the wound bed including Adherent Slough. The periwound skin appearance exhibited: Hemosiderin Staining. The periwound skin appearance did not exhibit: Callus, Crepitus, Excoriation, Induration, Rash, Scarring, Dry/Scaly, Maceration, Atrophie Blanche, Cyanosis, Ecchymosis, Mottled, Pallor, Rubor, Erythema. Periwound temperature was noted as No Abnormality. The periwound has tenderness on palpation. Assessment Active Problems ICD-10 Chronic venous hypertension (idiopathic) with ulcer and inflammation of right lower extremity Non-pressure chronic ulcer of right ankle with fat layer  exposed Procedures Wound #1 Pre-procedure diagnosis of Wound #1 is a Venous Leg Ulcer located on the Right,Medial Malleolus .Severity of Tissue Pre Debridement is: Fat layer exposed. There was a Selective/Open Wound Skin/Epidermis Debridement with a total area of 2.25 sq cm performed by [name], PA. With the following instrument(s): Curette to remove Viable and Non-Viable tissue/material. Material removed includes Eschar, Skin: Dermis, and Skin: Epidermis after achieving pain control using Lidocaine. No specimens were taken. A time out was conducted at 08:55, prior to the start of the procedure. A Minimum amount of bleeding was controlled with Pressure. The procedure was tolerated well with a pain level of 0 throughout and a pain level of 0 following the procedure. Post Debridement Measurements: 1.5cm length x 1.5cm width x 0.2cm depth; 0.353cm^3 volume. Character of Wound/Ulcer Post Debridement is improved. Severity of Tissue Post Debridement is: Fat layer exposed. Post procedure Diagnosis Wound #1: Same as Pre-Procedure General Notes: documented by Theophilus Bones,  RN. Plan Follow-up Appointments: Return Appointment in 1 week. - w/ Jeri Cos and Lauran Rm # 9 Anesthetic: (In clinic) Topical Lidocaine 5% applied to wound bed Bathing/ Shower/ Hygiene: May shower with protection but do not get wound dressing(s) wet. Edema Control - Lymphedema / SCD / Other: Elevate legs to the level of the heart or above for 30 minutes daily and/or when sitting, a frequency of: Avoid standing for long periods of time. Patient to wear own compression stockings every day. The following medication(s) was prescribed: lidocaine topical 5 % gel gel topical was prescribed at facility triamcinolone acetonide topical 0.1 % ointment ointment topical Apply a thin film to the wound bed and immediate surrounding tissue 3 times per week with each dressing change x 30 days starting 10/21/2021 WOUND #1: - Malleolus  Wound Laterality: Right, Medial Cleanser: Soap and Water 1 x Per Day/15 Days Discharge Instructions: May shower and wash wound with dial antibacterial soap and water prior to dressing change. Cleanser: Wound Cleanser (DME) (Generic) 1 x Per Day/15 Days Discharge Instructions: Cleanse the wound with wound cleanser prior to applying a clean dressing using gauze sponges, not tissue or cotton balls. Peri-Wound Care: Triamcinolone 15 (g) 1 x Per Day/15 Days Discharge Instructions: Use triamcinolone 15 (g) as directed Prim Dressing: Hydrofera Blue Ready Foam, 4x5 in (DME) (Generic) 1 x Per Day/15 Days ary Discharge Instructions: Apply to wound bed as instructed Secondary Dressing: Woven Gauze Sponge, Non-Sterile 4x4 in (DME) (Generic) 1 x Per Day/15 Days Discharge Instructions: Apply over primary dressing as directed. Secondary Dressing: Zetuvit Plus Silicone Border Dressing 5x5 (in/in) (DME) (Generic) 1 x Per Day/15 Days Discharge Instructions: Apply silicone border over primary dressing as directed. 1. Based on what I am seeing I would recommend that we go ahead and initiate a treatment currently with using triamcinolone I did actually send a prescription into the pharmacy for her today. 2. I am also can recommend that we utilize [facility] which I think is good to be a good option here. Next #3 we will use a Zetuvit bordered foam dressing to cover. 3. I am also going to suggest the patient should continue to monitor for any signs of worsening or infection. Obviously if anything changes she should contact the office and let me know but I am hopeful that over the next week she will see good improvement. We will see patient back for reevaluation in 1 week here in the clinic. If anything worsens or changes patient will contact our office for additional recommendations. Electronic Signature(s) Signed: 10/21/2021 5:23:09 PM By: [name] PA-C Entered By: [name] on 10/21/2021  17:23:09 -------------------------------------------------------------------------------- HxROS Details Patient Name: Date of Service: [name], [name]. 10/21/2021 8:00 A M Medical Record Number: [id number] Patient Account Number: [id number] Date of Birth/Sex: Treating RN: [birth year] (81 y.o. [name], [name] Primary Care Provider: [name] Other Clinician: Referring Provider: Treating Provider/Extender: [name] Weeks in Treatment: 0 Information Obtained From Patient Chart Constitutional Symptoms (General Health) Complaints and Symptoms: Negative for: Fatigue; Fever; Chills; Marked Weight Change Eyes Complaints and Symptoms: Negative for: Dry Eyes; Vision Changes; Glasses / Contacts Medical History: Negative for: Cataracts; Glaucoma; Optic Neuritis Ear/Nose/Mouth/Throat Complaints and Symptoms: Negative for: Chronic sinus problems or rhinitis Respiratory Complaints and Symptoms: Negative for: Chronic or frequent coughs; Shortness of Breath Gastrointestinal Complaints and Symptoms: Negative for: Frequent diarrhea; Nausea; Vomiting Genitourinary Complaints and Symptoms: Negative for: Frequent urination Integumentary (Skin) Complaints and Symptoms:  Positive for: Wounds Musculoskeletal Complaints and Symptoms: Negative for: Muscle Pain; Muscle Weakness Neurologic Complaints and Symptoms: Negative for: Numbness/parasthesias Psychiatric Complaints and Symptoms: Negative for: Claustrophobia Hematologic/Lymphatic Cardiovascular Medical History: Positive for: Peripheral Venous Disease Past Medical History Notes: hyperlipidemia Endocrine Medical History: Past Medical History Notes: hypothyroidism Immunological Oncologic Immunizations Pneumococcal Vaccine: Received Pneumococcal Vaccination: Yes Received Pneumococcal Vaccination On or After 60th Birthday: Yes Implantable Devices None Family and Social History Unknown  History: Yes; Never smoker; Marital Status - Married; Alcohol Use: Never; Drug Use: No History; Caffeine Use: Moderate; Financial Concerns: No; Food, Clothing or Shelter Needs: No; Support System Lacking: No; Transportation Concerns: No Electronic Signature(s) Signed: 10/21/2021 5:41:10 PM By: [name] PA-C Signed: 10/22/2021 3:46:02 PM By: Rhae Hammock RN Entered By: Rhae Hammock on 10/21/2021 08:05:04 -------------------------------------------------------------------------------- SuperBill Details Patient Name: Date of Service: [name], [name]. 10/21/2021 Medical Record Number: [id number] Patient Account Number: [id number] Date of Birth/Sex: Treating RN: 12/12/1940 (81 y.o. [name], [name] Primary Care Provider: [name] Other Clinician: Referring Provider: Treating Provider/Extender: [name] Weeks in Treatment: 0 Diagnosis Coding ICD-10 Codes Code Description [phone] Chronic venous hypertension (idiopathic) with ulcer and inflammation of right lower extremity L97.312 Non-pressure chronic ulcer of right ankle with fat layer exposed Facility Procedures CPT4 Code: [id number] Description: [facility] VISIT-LEV 4 EST PT Modifier: Quantity: 1 CPT4 Code: [id number] Description: 70623 - DEBRIDE WOUND 1ST 20 SQ CM OR < ICD-10 Diagnosis Description J62.831 Non-pressure chronic ulcer of right ankle with fat layer exposed Modifier: Quantity: 1 Physician Procedures : CPT4 Code Description Modifier [id number] - WC PHYS LEVEL 4 - EST PT 25 ICD-10 Diagnosis Description I87.331 Chronic venous hypertension (idiopathic) with ulcer and inflammation of right lower extremity L97.312 Non-pressure chronic ulcer of right  ankle with fat layer exposed Quantity: 1 : [id number] - WC PHYS DEBR WO ANESTH 20 SQ CM ICD-10 Diagnosis Description J50.093 Non-pressure chronic ulcer of right ankle with fat layer exposed Quantity:  1 Electronic Signature(s) Signed: 10/21/2021 5:23:45 PM By: [name] PA-C Entered By: [name] on 10/21/2021 17:23:45

## 2021-10-22 NOTE — Progress Notes (Signed)
[name], [name] [name] . Visit Report for 10/21/2021 Allergy List Details Patient Name: Date of Service: [name], [name]. 10/21/2021 8:00 A M Medical Record Number: [id number] Patient Account Number: [id number] Date of Birth/Sex: Treating RN: Dec 14, 1940 (81 y.o. [name], [name] Primary Care [name]: [name] Other Clinician: Referring [name]: Treating [name]/Extender: [name] Weeks in Treatment: 0 Allergies Active Allergies No Known Allergies Allergy Notes Electronic Signature(s) Signed: 10/22/2021 3:46:02 PM By: Rhae Hammock RN Entered By: Rhae Hammock on 10/21/2021 08:03:29 -------------------------------------------------------------------------------- Arrival Information Details Patient Name: Date of Service: [name], [name]. 10/21/2021 8:00 A M Medical Record Number: [id number] Patient Account Number: [id number] Date of Birth/Sex: Treating RN: July 29, 1940 (81 y.o. [name], [name] Primary Care Johnathin Vanderschaaf: [name] Other Clinician: Referring Jaleia Hanke: Treating Kiasia Chou/Extender: [name] Weeks in Treatment: 0 Visit Information Patient Arrived: Ambulatory Arrival Time: 08:02 Accompanied By: husband Transfer Assistance: None Patient Identification Verified: Yes Secondary Verification Process Completed: Yes Patient Requires Transmission-Based Precautions: No Patient Has Alerts: No Electronic Signature(s) Signed: 10/22/2021 3:46:02 PM By: Rhae Hammock RN Entered By: Rhae Hammock on 10/21/2021 08:03:12 -------------------------------------------------------------------------------- Clinic Level of Care Assessment Details Patient Name: Date of Service: [name], [name]. 10/21/2021 8:00 A M Medical Record Number: [id number] Patient Account Number: [id number] Date of Birth/Sex: Treating RN: [birth year] (81 y.o. [name], [name] Primary Care Jasdeep Kepner:  [name] Other Clinician: Referring Jearlene Bridwell: Treating Karston Hyland/Extender: [name] Weeks in Treatment: 0 Clinic Level of Care Assessment Items TOOL 3 Quantity Score X- 1 0 Use when EandM and Procedure is performed on FOLLOW-UP visit ASSESSMENTS - Nursing Assessment / Reassessment X- 1 10 Reassessment of Co-morbidities (includes updates in patient status) X- 1 5 Reassessment of Adherence to Treatment Plan ASSESSMENTS - Wound and Skin Assessment / Reassessment '[]'$  - Points for Wound Assessment can only be taken for a new wound of unknown or different etiology and a procedure is 0 NOT performed to that wound X- 1 5 Simple Wound Assessment / Reassessment - one wound '[]'$  - 0 Complex Wound Assessment / Reassessment - multiple wounds '[]'$  - 0 Dermatologic / Skin Assessment (not related to wound area) ASSESSMENTS - Focused Assessment X- 1 5 Circumferential Edema Measurements - multi extremities '[]'$  - 0 Nutritional Assessment / Counseling / Intervention '[]'$  - 0 Lower Extremity Assessment (monofilament, tuning fork, pulses) '[]'$  - 0 Peripheral Arterial Disease Assessment (using hand held doppler) ASSESSMENTS - Ostomy and/or Continence Assessment and Care '[]'$  - 0 Incontinence Assessment and Management '[]'$  - 0 Ostomy Care Assessment and Management (repouching, etc.) PROCESS - Coordination of Care '[]'$  - Points for Discharge Coordination can only be taken for a new wound of unknown or different etiology and a 0 procedure is NOT performed to that wound X- 1 15 Simple Patient / Family Education for ongoing care '[]'$  - 0 Complex (extensive) Patient / Family Education for ongoing care X- 1 10 Staff obtains Programmer, systems, Records, [name] Results / Process Orders est '[]'$  - 0 Staff telephones HHA, Nursing Homes / Clarify orders / etc '[]'$  - 0 Routine Transfer to another Facility (non-emergent condition) '[]'$  - 0 Routine Hospital Admission (non-emergent condition) X- 1  15 New Admissions / Biomedical engineer / Ordering NPWT Apligraf, etc. , '[]'$  - 0 Emergency Hospital Admission (emergent condition) X- 1 10 Simple Discharge Coordination '[]'$  - 0 Complex (extensive) Discharge Coordination PROCESS - Special Needs '[]'$  - 0 Pediatric / Minor Patient Management '[]'$  -  0 Isolation Patient Management '[]'$  - 0 Hearing / Language / Visual special needs '[]'$  - 0 Assessment of Community assistance (transportation, D/C planning, etc.) '[]'$  - 0 Additional assistance / Altered mentation '[]'$  - 0 Support Surface(s) Assessment (bed, cushion, seat, etc.) INTERVENTIONS - Wound Cleansing / Measurement '[]'$  - Points for Wound Cleaning / Measurement, Wound Dressing, Specimen Collection and Specimen taken to lab can 0 only be taken for a new wound of unknown or different etiology and a procedure is NOT performed to that wound X- 1 5 Simple Wound Cleansing - one wound '[]'$  - 0 Complex Wound Cleansing - multiple wounds X- 1 5 Wound Imaging (photographs - any number of wounds) '[]'$  - 0 Wound Tracing (instead of photographs) X- 1 5 Simple Wound Measurement - one wound '[]'$  - 0 Complex Wound Measurement - multiple wounds INTERVENTIONS - Wound Dressings X - Small Wound Dressing one or multiple wounds 1 10 '[]'$  - 0 Medium Wound Dressing one or multiple wounds '[]'$  - 0 Large Wound Dressing one or multiple wounds INTERVENTIONS - Miscellaneous '[]'$  - 0 External ear exam '[]'$  - 0 Specimen Collection (cultures, biopsies, blood, body fluids, etc.) '[]'$  - 0 Specimen(s) / Culture(s) sent or taken to Lab for analysis '[]'$  - 0 Patient Transfer (multiple staff / Civil Service fast streamer / Similar devices) '[]'$  - 0 Simple Staple / Suture removal (25 or less) '[]'$  - 0 Complex Staple / Suture removal (26 or more) '[]'$  - 0 Hypo / Hyperglycemic Management (close monitor of Blood Glucose) X- 1 15 Ankle / Brachial Index (ABI) - do not check if billed separately X- 1 5 Vital Signs Has the patient been seen at the  hospital within the last three years: Yes Total Score: 120 Level Of Care: New/Established - Level 4 Electronic Signature(s) Signed: 10/22/2021 3:46:02 PM By: Rhae Hammock RN Entered By: Rhae Hammock on 10/21/2021 09:53:39 -------------------------------------------------------------------------------- Encounter Discharge Information Details Patient Name: Date of Service: [name], [name]. 10/21/2021 8:00 A M Medical Record Number: [id number] Patient Account Number: [id number] Date of Birth/Sex: Treating RN: 1940/05/15 (81 y.o. [name], [name] Primary Care Airam Runions: [name] Other Clinician: Referring Dava Rensch: Treating Honestee Revard/Extender: [name] Weeks in Treatment: 0 Encounter Discharge Information Items Post Procedure Vitals Discharge Condition: Stable Temperature (F): 98.7 Ambulatory Status: Ambulatory Pulse (bpm): 74 Discharge Destination: Home Respiratory Rate (breaths/min): 17 Transportation: Private Auto Blood Pressure (mmHg): 140/74 Accompanied By: husband Schedule Follow-up Appointment: Yes Clinical Summary of Care: Patient Declined Electronic Signature(s) Signed: 10/22/2021 3:46:02 PM By: Rhae Hammock RN Entered By: Rhae Hammock on 10/21/2021 09:54:13 -------------------------------------------------------------------------------- Lower Extremity Assessment Details Patient Name: Date of Service: [name], [name]. 10/21/2021 8:00 A M Medical Record Number: [id number] Patient Account Number: [id number] Date of Birth/Sex: Treating RN: Feb 10, 1940 (81 y.o. [name], [name] Primary Care Wilkes Potvin: [name] Other Clinician: Referring Vernell Back: Treating Lucinda Spells/Extender: [name] Weeks in Treatment: 0 Edema Assessment Assessed: [Left: No] [Right: Yes] Edema: [Left: Ye] [Right: s] Calf Left: Right: Point of Measurement: 37 cm From Medial Instep 41  cm Ankle Left: Right: Point of Measurement: 8 cm From Medial Instep 23 cm Knee To Floor Left: Right: From Medial Instep 42 cm Vascular Assessment Pulses: Dorsalis Pedis Palpable: [Right:Yes] Posterior Tibial Palpable: [Right:Yes] Blood Pressure: Brachial: [Right:144] Ankle: [Right:Dorsalis Pedis: 170 1.18] Electronic Signature(s) Signed: 10/22/2021 3:46:02 PM By: Rhae Hammock RN Entered By: Rhae Hammock on 10/21/2021 08:21:30 -------------------------------------------------------------------------------- Multi-Disciplinary Care Plan Details Patient Name: Date of Service:  [name], [name]. 10/21/2021 8:00 A M Medical Record Number: [id number] Patient Account Number: [id number] Date of Birth/Sex: Treating RN: 11-07-1940 (81 y.o. [name], [name] Primary Care Maurico Perrell: [name] Other Clinician: Referring Ethal Gotay: Treating Laruth Hanger/Extender: [name] Weeks in Treatment: 0 Active Inactive Orientation to the Wound Care Program Nursing Diagnoses: Knowledge deficit related to the wound healing center program Goals: Patient/caregiver will verbalize understanding of the [place] Date Initiated: 10/21/2021 Target Resolution Date: 11/21/2021 Goal Status: Active Interventions: Provide education on orientation to the wound center Notes: Wound/Skin Impairment Nursing Diagnoses: Impaired tissue integrity Knowledge deficit related to smoking impact on wound healing Goals: Patient will have a decrease in wound volume by X% from date: (specify in notes) Date Initiated: 10/21/2021 Target Resolution Date: 11/20/2021 Goal Status: Active Patient/caregiver will verbalize understanding of skin care regimen Date Initiated: 10/21/2021 Target Resolution Date: 11/21/2021 Goal Status: Active Ulcer/skin breakdown will have a volume reduction of 30% by week 4 Date Initiated: 10/21/2021 Target Resolution Date:  11/20/2021 Goal Status: Active Interventions: Assess patient/caregiver ability to obtain necessary supplies Assess patient/caregiver ability to perform ulcer/skin care regimen upon admission and as needed Assess ulceration(s) every visit Notes: Electronic Signature(s) Signed: 10/22/2021 3:46:02 PM By: Rhae Hammock RN Entered By: Rhae Hammock on 10/21/2021 08:28:01 -------------------------------------------------------------------------------- Pain Assessment Details Patient Name: Date of Service: [name], [name]. 10/21/2021 8:00 A M Medical Record Number: [id number] Patient Account Number: [id number] Date of Birth/Sex: Treating RN: Jan 25, 1940 (81 y.o. [name], [name] Primary Care Tavaris Eudy: [name] Other Clinician: Referring Lauran Romanski: Treating Tayvien Kane/Extender: [name] Weeks in Treatment: 0 Active Problems Location of Pain Severity and Description of Pain Patient Has Paino No Site Locations Pain Management and Medication Current Pain Management: Electronic Signature(s) Signed: 10/22/2021 3:46:02 PM By: Rhae Hammock RN Entered By: Rhae Hammock on 10/21/2021 08:06:31 -------------------------------------------------------------------------------- Patient/Caregiver Education Details Patient Name: Date of Service: [name] 10/4/2023andnbsp8:00 A M Medical Record Number: [id number] Patient Account Number: [id number] Date of Birth/Gender: Treating RN: [birth year] (81 y.o. [name], [place] Primary Care Physician: [name] Other Clinician: Referring Physician: Treating Physician/Extender: [name] Weeks in Treatment: 0 Education Assessment Education Provided To: Patient Education Topics Provided [place]: o Methods: Explain/Verbal Responses: State content correctly Electronic Signature(s) Signed: 10/22/2021 3:46:02 PM By: Rhae Hammock RN Entered By: Rhae Hammock on 10/21/2021 08:29:32 -------------------------------------------------------------------------------- Wound Assessment Details Patient Name: Date of Service: [name], [name]. 10/21/2021 8:00 A M Medical Record Number: [id number] Patient Account Number: [id number] Date of Birth/Sex: Treating RN: 1940-08-21 (81 y.o. [name], [name] Primary Care Sueo Cullen: [name] Other Clinician: Referring Azaliah Carrero: Treating Gwenda Heiner/Extender: [name] Weeks in Treatment: 0 Wound Status Wound Number: 1 Primary Etiology: Venous Leg Ulcer Wound Location: Right, Medial Malleolus Wound Status: Open Wounding Event: Gradually Appeared Comorbid History: Peripheral Venous Disease Date Acquired: 09/30/2021 Weeks Of Treatment: 0 Clustered Wound: No Photos Wound Measurements Length: (cm) 1.5 Width: (cm) 1.5 Depth: (cm) 0.2 Area: (cm) 1.767 Volume: (cm) 0.353 % Reduction in Area: % Reduction in Volume: Epithelialization: Small (1-33%) Tunneling: No Undermining: No Wound Description Classification: Full Thickness With Exposed Support Structures Wound Margin: Distinct, outline attached Exudate Amount: Medium Exudate Type: Serosanguineous Exudate Color: red, brown Foul Odor After Cleansing: No Slough/Fibrino Yes Wound Bed Granulation Amount: Medium (34-66%) Exposed Structure Granulation Quality: Red, Pink Fascia Exposed: No Necrotic Amount:  Medium (34-66%) Fat Layer (Subcutaneous Tissue) Exposed: Yes Necrotic Quality: Adherent Slough Tendon Exposed: No Muscle Exposed: No Joint Exposed: No Bone Exposed: No Periwound Skin Texture Texture Color No Abnormalities Noted: No No Abnormalities Noted: No Callus: No Atrophie Blanche: No Crepitus: No Cyanosis: No Excoriation: No Ecchymosis: No Induration: No Erythema: No Rash: No Hemosiderin Staining: Yes Scarring: No Mottled: No Pallor:  No Moisture Rubor: No No Abnormalities Noted: No Dry / Scaly: No Temperature / Pain Maceration: No Temperature: No Abnormality Tenderness on Palpation: Yes Treatment Notes Wound #1 (Malleolus) Wound Laterality: Right, Medial Cleanser Soap and Water Discharge Instruction: May shower and wash wound with dial antibacterial soap and water prior to dressing change. Wound Cleanser Discharge Instruction: Cleanse the wound with wound cleanser prior to applying a clean dressing using gauze sponges, not tissue or cotton balls. Peri-Wound Care Triamcinolone 15 (g) Discharge Instruction: Use triamcinolone 15 (g) as directed Topical Primary Dressing Hydrofera Blue Ready Foam, 4x5 in Discharge Instruction: Apply to wound bed as instructed Secondary Dressing Woven Gauze Sponge, Non-Sterile 4x4 in Discharge Instruction: Apply over primary dressing as directed. Zetuvit Plus Silicone Border Dressing 5x5 (in/in) Discharge Instruction: Apply silicone border over primary dressing as directed. Secured With Compression Wrap Compression Stockings Environmental education officer) Signed: 10/22/2021 3:46:02 PM By: Rhae Hammock RN Entered By: Rhae Hammock on 10/21/2021 08:17:32 -------------------------------------------------------------------------------- Vitals Details Patient Name: Date of Service: [name], [name]. 10/21/2021 8:00 A M Medical Record Number: [id number] Patient Account Number: [id number] Date of Birth/Sex: Treating RN: 05-29-1940 (81 y.o. [name], [name] Primary Care Ellesse Antenucci: [name] Other Clinician: Referring Gwenna Fuston: Treating Joyel Chenette/Extender: [name] Weeks in Treatment: 0 Vital Signs Time Taken: 08:08 Temperature (F): 98.7 Pulse (bpm): 74 Respiratory Rate (breaths/min): 17 Blood Pressure (mmHg): 144/74 Reference Range: 80 - 120 mg / dl Electronic Signature(s) Signed: 10/22/2021 3:46:02 PM By: Rhae Hammock RN Entered By: Rhae Hammock on 10/21/2021 91:50:56

## 2021-10-28 ENCOUNTER — Encounter (HOSPITAL_BASED_OUTPATIENT_CLINIC_OR_DEPARTMENT_OTHER): Payer: Medicare Other | Admitting: Physician Assistant

## 2021-10-28 DIAGNOSIS — L97312 Non-pressure chronic ulcer of right ankle with fat layer exposed: Secondary | ICD-10-CM | POA: Diagnosis not present

## 2021-10-28 DIAGNOSIS — I776 Arteritis, unspecified: Secondary | ICD-10-CM | POA: Diagnosis not present

## 2021-10-28 DIAGNOSIS — I87331 Chronic venous hypertension (idiopathic) with ulcer and inflammation of right lower extremity: Secondary | ICD-10-CM | POA: Diagnosis not present

## 2021-10-28 DIAGNOSIS — I10 Essential (primary) hypertension: Secondary | ICD-10-CM | POA: Diagnosis not present

## 2021-10-28 NOTE — Progress Notes (Addendum)
[name] ([id number]) [id number] Page 1 of 6 Visit Report for 10/28/2021 Chief Complaint Document Details Patient Name: Date of Service: [name], [name] 10/28/2021 9:15 A M Medical Record Number: [id number] Patient Account Number: [id number] Date of Birth/Sex: Treating RN: Apr 27, 1940 (81 y.o. [name], [name] Primary Care Provider: Leonides [name] Other Clinician: Referring Provider: Treating Provider/Extender: [name] in Treatment: 1 Information Obtained from: Patient Chief Complaint Right ankle ulcer Electronic Signature(s) Signed: 10/28/2021 9:02:17 AM By: [name] PA-C Entered By: [name] on 10/28/2021 09:02:17 -------------------------------------------------------------------------------- HPI Details Patient Name: Date of Service: [name], Kim [name]. 10/28/2021 9:15 A M Medical Record Number: [id number] Patient Account Number: [id number] Date of Birth/Sex: Treating RN: [birth year] (81 y.o. [name], [name] Primary Care Provider: Leonides [name] Other Clinician: Referring Provider: Treating Provider/Extender: [name] in Treatment: 1 History of Present Illness HPI Description: 10-21-2021 upon evaluation today patient appears to be doing poorly in regard to the wound over the medial aspect of her right ankle. This is an area that previously she has had trouble with previously. I have actually seen her in [place] about a year ago but this was on the left ankle at that time. 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 This is also the typical spot for her as it is a very difficult area to compress. Nonetheless previously we have been able to get this under control using her compression socks as well as Xeroform gauze although I am not sure that is can be the best thing for her at this  time we discussed that as we proceed. 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 Nonetheless I do think that we could potentially try some oral steroids, prednisone, to see if we can help calm this down. She is not opposed to this at all. In fact she would like to do anything she can to try to get better she tells me the Achilles area has been very painful at times for her. I really do not see any signs of infection right now but that symptomatic = as well. Electronic Signature(s) Signed: 10/28/2021 9:58:58 AM By: [name] PA-C Entered By: [name] on 10/28/2021 09:58:58 Physical Exam Details -------------------------------------------------------------------------------- [name] ([id number]) [id number] Page 2 of 6 Patient Name: Date of Service: [name] 10/28/2021 9:15 A M Medical Record Number: [id number] Patient Account Number: [id number] Date of Birth/Sex: Treating RN: October 17, 1940 (81 y.o. [name], [name] Primary Care Provider: Leonides [name] Other Clinician: Referring Provider: Treating Provider/Extender: [name] in Treatment: 1 Constitutional Well-nourished and well-hydrated in no acute distress. Respiratory normal breathing without difficulty. Psychiatric this patient is able to make decisions and demonstrates good insight into disease process. Alert and Oriented x 3. pleasant and cooperative. Notes Upon inspection patient's wound bed actually showed signs of good granulation and epithelization at this point for the most part. Fortunately I do not see any evidence of  infection although there is a few other small areas that  have started to try to open around the perimeter of where the original wound was. I do believe that potentially oral steroids could be indicated here to try to help with some of the inflammation considering the likely inflammatory nature of what were seen here with her history of livedoid vasculitis. Electronic Signature(s) Signed: 10/28/2021 9:59:28 AM By: [name] PA-C Entered By: [name] on 10/28/2021 09:59:28 -------------------------------------------------------------------------------- Physician Orders Details Patient Name: Date of Service: [name], Kim [name]. 10/28/2021 9:15 A M Medical Record Number: [id number] Patient Account Number: [id number] Date of Birth/Sex: Treating RN: [birth year] (81 y.o. [name], [name] Primary Care Provider: Leonides [name] Other Clinician: Referring Provider: Treating Provider/Extender: [name] in Treatment: 1 Verbal / Phone Orders: No Diagnosis Coding ICD-10 Coding Code Description I87.331 Chronic venous hypertension (idiopathic) with ulcer and inflammation of right lower extremity L97.312 Non-pressure chronic ulcer of right ankle with fat layer exposed Follow-up Appointments ppointment in 1 week. - w/ Jeri Cos and [facility] Rm # 9 Return A Anesthetic (In clinic) Topical Lidocaine 5% applied to wound bed Bathing/ Shower/ Hygiene May shower with protection but do not get wound dressing(s) wet. Edema Control - Lymphedema / SCD / Other Elevate legs to the level of the heart or above for 30 minutes daily and/or when sitting, a frequency of: Avoid standing for long periods of time. Patient to wear own compression stockings every day. Wound Treatment Wound #1 - Malleolus Wound Laterality: Right, Medial Cleanser: Soap and Water 1 x Per [id number] Days Discharge Instructions: May shower and wash wound with dial antibacterial soap and water  prior to dressing change. Cleanser: Wound Cleanser 1 x Per Day/15 Days Discharge Instructions: Cleanse the wound with wound cleanser prior to applying a clean dressing using gauze sponges, not tissue or cotton balls. Peri-Wound Care: Triamcinolone 15 (g) 1 x Per Day/15 Days Discharge Instructions: Use triamcinolone 15 (g) as directed TIFFINIE, [name] ([id number]) [id number] Page 3 of 6 Prim Dressing: Hydrofera Blue Ready Foam, 4x5 in 1 x Per Day/15 Days ary Discharge Instructions: Apply to wound bed as instructed Secondary Dressing: Woven Gauze Sponge, Non-Sterile 4x4 in 1 x Per Day/15 Days Discharge Instructions: Apply over primary dressing as directed. Secured With: [employer], 4.5x3.1 (in/yd) 1 x Per Day/15 Days Discharge Instructions: Secure with Kerlix as directed. Secured With: Tubigrip Size D, 3x10 (in/yd) 1 x Per Day/15 Days Patient Medications llergies: No Known Allergies A Notifications Medication Indication Start End 10/28/2021 prednisone DOSE 1 - oral 50 mg tablet - 1 tablet oral taken 1 time per day first thing in the morning with foot x 7 days. Do not take ibuprofen or fioricet while on this medication Electronic Signature(s) Signed: 10/28/2021 10:02:44 AM By: [name] PA-C Entered By: [name] on 10/28/2021 10:02:44 -------------------------------------------------------------------------------- Problem List Details Patient Name: Date of Service: [name], Kim [name]. 10/28/2021 9:15 A M Medical Record Number: [id number] Patient Account Number: [id number] Date of Birth/Sex: Treating RN: May 20, 1940 (81 y.o. 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 10/28/2021 No  Yes I87.331 Chronic venous hypertension (idiopathic) with ulcer and inflammation of right 10/21/2021 No Yes lower extremity L97.312 Non-pressure chronic ulcer of right ankle with fat layer exposed 10/21/2021 No Yes Inactive Problems Resolved Problems Electronic Signature(s) Signed: 10/28/2021 9:58:34  AM By: [name] PA-C Previous Signature: 10/28/2021 9:02:04 AM Version By: [name] PA-C Entered By: [name] on 10/28/2021 09:58:34 [name], [name] ([id number]) [id number] Page 4 of 6 -------------------------------------------------------------------------------- Progress Note Details Patient Name: Date of Service: [name], [name] 10/28/2021 9:15 A M Medical Record Number: [id number] Patient Account Number: [id number] Date of Birth/Sex: Treating RN: 01-Aug-1940 (81 y.o. [name], [name] Primary Care Provider: Leonides [name] Other Clinician: Referring Provider: Treating Provider/Extender: [name] in Treatment: 1 Subjective Chief Complaint Information obtained from Patient Right ankle ulcer History of Present Illness (HPI) 10-21-2021 upon evaluation today patient appears to be doing poorly in regard to the wound over the medial aspect of her right ankle. This is an area that previously she has had trouble with previously. I have actually seen her in [place] about a year ago but this was on the left ankle at that time. 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 This is also the typical spot for her as it is a very difficult area to compress. Nonetheless previously we have been able to get this under control using her compression socks as well as Xeroform gauze although I am not sure that is can be the best thing for her at this time we discussed that as we proceed. Patient does have chronic venous hypertension and a history of  vasculitis but otherwise no major medical problems. 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 Nonetheless I do think that we could potentially try some oral steroids, prednisone, to see if we can help calm this down. She is not opposed to this at all. In fact she would like to do anything she can to try to get better she tells me the Achilles area has been very painful at times for her. I really do not see any signs of infection right now but that symptomatic = as well. Objective Constitutional Well-nourished and well-hydrated in no acute distress. Vitals Time Taken: 9:12 AM, Temperature: 97.8 F, Pulse: 65 bpm, Respiratory Rate: 20 breaths/min, Blood Pressure: 160/79 mmHg. Respiratory normal breathing without difficulty. Psychiatric this patient is able to make decisions and demonstrates good insight into disease process. Alert and Oriented x 3. pleasant and cooperative. General Notes: Upon inspection patient's wound bed actually showed signs of good granulation and epithelization at this point for the most part. Fortunately I do not see any evidence of infection although there is a few other small areas that have started to try to open around the perimeter of where the original wound was. I do believe that potentially oral steroids could be indicated here to try to help with some of the inflammation considering the likely inflammatory nature of what were seen here with her history of livedoid vasculitis. Integumentary (Hair, Skin) Wound #1 status is Open. Original cause of wound was Gradually Appeared. The date acquired was: 09/30/2021. The wound has been in treatment 1 [name]. The wound is located on the Right,Medial Malleolus. The wound measures 5.5cm length x 6cm width x 0.2cm depth; 25.918cm^2 area and  5.184cm^3 volume. There is Fat Layer (Subcutaneous Tissue) exposed. There is no tunneling or undermining noted. There is a medium amount  of serosanguineous drainage noted. The wound margin is distinct with the outline attached to the wound base. There is large (67-100%) red, pink granulation within the wound bed. There is a small (1- 33%) amount of necrotic tissue within the wound bed including Adherent Slough. The periwound skin appearance did not exhibit: Callus, Crepitus, Excoriation, Induration, Rash, Scarring, Dry/Scaly, Maceration, Atrophie Blanche, Cyanosis, Ecchymosis, Hemosiderin Staining, Mottled, Pallor, Rubor, Erythema. Periwound temperature was noted as No Abnormality. The periwound has tenderness on palpation. 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 - w/ Jeri Cos and [facility] Rm # 9 Anesthetic: (In clinic) Topical Lidocaine 5% applied to wound bed Bathing/ Shower/ Hygiene: May shower with protection but do not get wound dressing(s) wet. Edema Control - Lymphedema / SCD / Other: Elevate legs to the level of the heart or above for 30 minutes daily and/or when sitting, a frequency of: Avoid standing for long periods of time. Patient to wear own compression stockings every day. The following medication(s) was prescribed: prednisone oral 50 mg tablet 1 1 tablet oral taken 1 time per day first thing in the morning with foot x 7 days. Do not take ibuprofen or fioricet while on this medication starting 10/28/2021 WOUND #1: - Malleolus Wound Laterality: Right, Medial Cleanser: Soap and Water 1 x Per Day/15 Days Discharge Instructions: May shower and wash wound with dial  antibacterial soap and water prior to dressing change. Cleanser: Wound Cleanser 1 x Per Day/15 Days Discharge Instructions: Cleanse the wound with wound cleanser prior to applying a clean dressing using gauze sponges, not tissue or cotton balls. Peri-Wound Care: Triamcinolone 15 (g) 1 x Per Day/15 Days Discharge Instructions: Use triamcinolone 15 (g) as directed Prim Dressing: Hydrofera Blue Ready Foam, 4x5 in 1 x Per Day/15 Days ary Discharge Instructions: Apply to wound bed as instructed Secondary Dressing: Woven Gauze Sponge, Non-Sterile 4x4 in 1 x Per Day/15 Days Discharge Instructions: Apply over primary dressing as directed. Secured With: [employer], 4.5x3.1 (in/yd) 1 x Per Day/15 Days Discharge Instructions: Secure with Kerlix as directed. Secured With: Tubigrip Size D, 3x10 (in/yd) 1 x Per Day/15 Days 1. I am good recommend currently that we go ahead and send in a prescription for prednisone and I did do that for her today she will just be taking that for the next week. 2. I am also can recommend that she continue with the topical steroids followed by the [facility]. 3. We discussed compression wrapping today. With that being said the patient has a wedding on Saturday that she is going to be going to. For that reason she did not want to have a compression wrap on his wound honestly messed with her outfit and specifically being able to get into her shoe. For that reason we did discussed avoiding that for now we will use double layer Tubigrip for the time being we will see how things are. Next week. If that seems to be doing a good job then we may just be able to stick with that if not we can look towards doing the compression wrap at that point. As we discussed before this posterior aspect of the ankle really is difficult to wrap very well as far as getting compression [name] be extremely effective.We will see patient back for  reevaluation in 1 week here o in the clinic. If  anything worsens or changes patient will contact our office for additional recommendations. Electronic Signature(s) Signed: 10/28/2021 10:04:03 AM By: [name] PA-C Entered By: [name] on 10/28/2021 10:04:02 -------------------------------------------------------------------------------- SuperBill Details Patient Name: Date of Service: [name], Kim [name]. 10/28/2021 Medical Record Number: [id number] Patient Account Number: [id number] Date of Birth/Sex: Treating RN: November 04, 1940 (81 y.o. 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 I87.331 Chronic venous hypertension (idiopathic) with ulcer and inflammation of right lower extremity L97.312 Non-pressure chronic ulcer of right ankle with fat layer exposed Facility Procedures MYRELLA, HULICK ([id number]): CPT4 Code Description [id number] [phone] - WOUND CARE VISIT-LEV 3 EST PT [id number] Page 6 of 6: Modifier Quantity 1 Electronic Signature(s) Signed: 11/17/2021 4:08:17 PM By: [name] PA-C Signed: 03/08/2022 9:55:17 AM By: Sharyn Creamer RN, BSN Previous Signature: 10/28/2021 10:04:28 AM Version By: [name] PA-C Previous Signature: 10/28/2021 10:04:16 AM Version By: [name] PA-C Entered By: Sharyn Creamer on 11/16/2021 14:23:07

## 2021-11-02 DIAGNOSIS — L97312 Non-pressure chronic ulcer of right ankle with fat layer exposed: Secondary | ICD-10-CM | POA: Diagnosis not present

## 2021-11-02 NOTE — Progress Notes (Addendum)
EULINE, MCCORY ([id number]) [id number] Page 1 of 7 Visit Report for 10/28/2021 Arrival Information Details Patient Name: Date of Service: [name], [name] 10/28/2021 9:15 A M Medical Record Number: [id number] Patient Account Number: [id number] Date of Birth/Sex: Treating RN: 1940-02-24 (81 y.o. [name], [name] Primary Care Hildagard Sobecki: [name] Other Clinician: Referring Bani Gianfrancesco: Treating Cory Kitt/Extender: [name] Weeks in Treatment: 1 Visit Information History Since Last Visit Added or deleted any medications: No Patient Arrived: Ambulatory Any new allergies or adverse reactions: No Arrival Time: 09:12 Had a fall or experienced change in No Accompanied By: husband activities of daily living that may affect Transfer Assistance: None risk of falls: Patient Identification Verified: Yes Signs or symptoms of abuse/neglect since last visito No Secondary Verification Process Completed: Yes Hospitalized since last visit: No Patient Requires Transmission-Based Precautions: No Implantable device outside of the clinic excluding No Patient Has Alerts: No cellular tissue based products placed in the center since last visit: Has Dressing in Place as Prescribed: Yes Pain Present Now: No Electronic Signature(s) Signed: 10/28/2021 4:39:54 PM By: Erenest Blank Entered By: Erenest Blank on 10/28/2021 09:14:50 -------------------------------------------------------------------------------- Clinic Level of Care Assessment Details Patient Name: Date of Service: [name], [name] 10/28/2021 9:15 A M Medical Record Number: [id number] Patient Account Number: [id number] Date of Birth/Sex: Treating RN: [birth year] (81 y.o. [name] Primary Care Talor Desrosiers: [name] Other Clinician: Referring Katheen Aslin: Treating Gypsy Kellogg/Extender: [name] Weeks in Treatment: 1 Clinic Level of  Care Assessment Items TOOL 4 Quantity Score X- 1 0 Use when only an EandM is performed on FOLLOW-UP visit ASSESSMENTS - Nursing Assessment / Reassessment X- 1 10 Reassessment of Co-morbidities (includes updates in patient status) X- 1 5 Reassessment of Adherence to Treatment Plan ASSESSMENTS - Wound and Skin A ssessment / Reassessment X - Simple Wound Assessment / Reassessment - one wound 1 5 []$  - 0 Complex Wound Assessment / Reassessment - multiple wounds []$  - 0 Dermatologic / Skin Assessment (not related to wound area) ASSESSMENTS - Focused Assessment X- 1 5 Circumferential Edema Measurements - multi extremities []$  - 0 Nutritional Assessment / Counseling / Intervention LARYSA, MILANI T ([id number]) [id number] Page 2 of 7 []$  - 0 Lower Extremity Assessment (monofilament, tuning fork, pulses) []$  - 0 Peripheral Arterial Disease Assessment (using hand held doppler) ASSESSMENTS - Ostomy and/or Continence Assessment and Care []$  - 0 Incontinence Assessment and Management []$  - 0 Ostomy Care Assessment and Management (repouching, etc.) PROCESS - Coordination of Care X - Simple Patient / Family Education for ongoing care 1 15 []$  - 0 Complex (extensive) Patient / Family Education for ongoing care X- 1 10 Staff obtains Programmer, systems, Records, T Results / Process Orders est []$  - 0 Staff telephones HHA, Nursing Homes / Clarify orders / etc []$  - 0 Routine Transfer to another Facility (non-emergent condition) []$  - 0 Routine Hospital Admission (non-emergent condition) []$  - 0 New Admissions / Biomedical engineer / Ordering NPWT Apligraf, etc. , []$  - 0 Emergency Hospital Admission (emergent condition) []$  - 0 Simple Discharge Coordination []$  - 0 Complex (extensive) Discharge Coordination PROCESS - Special Needs []$  - 0 Pediatric / Minor Patient Management []$  - 0 Isolation Patient Management []$  - 0 Hearing / Language / Visual special needs []$  -  0 Assessment of Community assistance (transportation, D/C planning, etc.) []$  - 0 Additional assistance / Altered mentation []$  - 0 Support Surface(s) Assessment (bed, cushion, seat, etc.) INTERVENTIONS - Wound  Cleansing / Measurement X - Simple Wound Cleansing - one wound 1 5 []$  - 0 Complex Wound Cleansing - multiple wounds X- 1 5 Wound Imaging (photographs - any number of wounds) []$  - 0 Wound Tracing (instead of photographs) X- 1 5 Simple Wound Measurement - one wound []$  - 0 Complex Wound Measurement - multiple wounds INTERVENTIONS - Wound Dressings X - Small Wound Dressing one or multiple wounds 1 10 []$  - 0 Medium Wound Dressing one or multiple wounds []$  - 0 Large Wound Dressing one or multiple wounds []$  - 0 Application of Medications - topical []$  - 0 Application of Medications - injection INTERVENTIONS - Miscellaneous []$  - 0 External ear exam []$  - 0 Specimen Collection (cultures, biopsies, blood, body fluids, etc.) []$  - 0 Specimen(s) / Culture(s) sent or taken to Lab for analysis []$  - 0 Patient Transfer (multiple staff / Civil Service fast streamer / Similar devices) []$  - 0 Simple Staple / Suture removal (25 or less) []$  - 0 Complex Staple / Suture removal (26 or more) []$  - 0 Hypo / Hyperglycemic Management (close monitor of Blood Glucose) Balaban, Neeley T ([id number]) [id number] Page 3 of 7 []$  - 0 Ankle / Brachial Index (ABI) - do not check if billed separately X- 1 5 Vital Signs Has the patient been seen at the hospital within the last three years: Yes Total Score: 80 Level Of Care: New/Established - Level 3 Electronic Signature(s) Signed: 03/08/2022 9:55:17 AM By: Sharyn Creamer RN, BSN Entered By: Sharyn Creamer on 11/16/2021 14:22:30 -------------------------------------------------------------------------------- Lower Extremity Assessment Details Patient Name: Date of Service: [name], [name] 10/28/2021 9:15 A M Medical Record Number:  [id number] Patient Account Number: [id number] Date of Birth/Sex: Treating RN: [birth year] (81 y.o. [name], [name] Primary Care Sequoia Mincey: [name] Other Clinician: Referring Charlen Bakula: Treating Essam Lowdermilk/Extender: [name] Weeks in Treatment: 1 Edema Assessment Assessed: [Left: No] [Right: No] Edema: [Left: Ye] [Right: s] Calf Left: Right: Point of Measurement: 37 cm From Medial Instep 45 cm Ankle Left: Right: Point of Measurement: 8 cm From Medial Instep 23 cm Vascular Assessment Pulses: Dorsalis Pedis Palpable: [Right:Yes] Electronic Signature(s) Signed: 10/28/2021 4:39:54 PM By: Erenest Blank Signed: 10/29/2021 4:29:25 PM By: Rhae Hammock RN Entered By: Erenest Blank on 10/28/2021 09:19:50 -------------------------------------------------------------------------------- Multi-Disciplinary Care Plan Details Patient Name: Date of Service: [name], [name] 10/28/2021 9:15 A M Medical Record Number: [id number] Patient Account Number: [id number] Date of Birth/Sex: Treating RN: [birth year] (81 y.o. [name], [name] Primary Care Tamaya Pun: [name] Other Clinician: Referring Deshae Dickison: Treating Elizardo Chilson/Extender: [name] Weeks in Treatment: 1 Active Inactive APPHIA, PLAGMAN T ([id number]) [id number] Page 4 of 7 Orientation to the Wound Care Program Nursing Diagnoses: Knowledge deficit related to the wound healing center program Goals: Patient/caregiver will verbalize understanding of the [place] Date Initiated: 10/21/2021 Target Resolution Date: 11/21/2021 Goal Status: Active Interventions: Provide education on orientation to the wound center Notes: Wound/Skin Impairment Nursing Diagnoses: Impaired tissue integrity Knowledge deficit related to smoking impact on wound healing Goals: Patient will have a decrease in wound volume by X% from  date: (specify in notes) Date Initiated: 10/21/2021 Target Resolution Date: 11/20/2021 Goal Status: Active Patient/caregiver will verbalize understanding of skin care regimen Date Initiated: 10/21/2021 Target Resolution Date: 11/21/2021 Goal Status: Active Ulcer/skin breakdown will have a volume reduction of 30% by week 4 Date Initiated: 10/21/2021 Target Resolution Date: 11/20/2021 Goal Status: Active Interventions: Assess patient/caregiver ability to obtain necessary  supplies Assess patient/caregiver ability to perform ulcer/skin care regimen upon admission and as needed Assess ulceration(s) every visit Notes: Electronic Signature(s) Signed: 10/29/2021 4:29:25 PM By: Rhae Hammock RN Signed: 11/02/2021 1:44:39 PM By: Sharyn Creamer RN, BSN Entered By: Sharyn Creamer on 10/28/2021 09:55:40 -------------------------------------------------------------------------------- Pain Assessment Details Patient Name: Date of Service: [name], Masiel T. 10/28/2021 9:15 A M Medical Record Number: [id number] Patient Account Number: [id number] Date of Birth/Sex: Treating RN: 09/14/1940 (81 y.o. [name], [name] Primary Care Afra Tricarico: [name] Other Clinician: Referring Mana Haberl: Treating Mahlet Jergens/Extender: [name] Weeks in Treatment: 1 Active Problems Location of Pain Severity and Description of Pain Patient Has Paino No Site Locations [name], Kim T ([id number]) [id number] Page 5 of 7 Pain Management and Medication Current Pain Management: Electronic Signature(s) Signed: 10/28/2021 4:39:54 PM By: Erenest Blank Signed: 10/29/2021 4:29:25 PM By: Rhae Hammock RN Entered By: Erenest Blank on 10/28/2021 09:15:13 -------------------------------------------------------------------------------- Patient/Caregiver Education Details Patient Name: Date of Service: Kim Lance T. 10/11/2023andnbsp9:15 A M Medical  Record Number: [id number] Patient Account Number: [id number] Date of Birth/Gender: Treating RN: 12/23/1940 (81 y.o. [name], [name] Primary Care Physician: [name] Other Clinician: Referring Physician: Treating Physician/Extender: [name] Weeks in Treatment: 1 Education Assessment Education Provided To: Patient Education Topics Provided Wound/Skin Impairment: Methods: Explain/Verbal Responses: State content correctly Electronic Signature(s) Signed: 11/02/2021 1:44:39 PM By: Sharyn Creamer RN, BSN Entered By: Sharyn Creamer on 10/28/2021 09:55:49 -------------------------------------------------------------------------------- Wound Assessment Details Patient Name: Date of Service: [name], [name] 10/28/2021 9:15 A M Medical Record Number: [id number] Patient Account Number: [id number] Date of Birth/Sex: Treating RN: 04-06-1940 (81 y.o. Benjaman Lobe Primary Care Teasha Murrillo: [name] Other Clinician: THYRA, [name] ([id number]) [id number] Page 6 of 7 Referring Athanasios Heldman: Treating Jony Ladnier/Extender: [name] Weeks in Treatment: 1 Wound Status Wound Number: 1 Primary Etiology: Venous Leg Ulcer Wound Location: Right, Medial Malleolus Wound Status: Open Wounding Event: Gradually Appeared Comorbid History: Peripheral Venous Disease Date Acquired: 09/30/2021 Weeks Of Treatment: 1 Clustered Wound: Yes Photos Wound Measurements Length: (cm) Width: (cm) Depth: (cm) Clustered Quantity: Area: (cm) Volume: (cm) 5.5 % Reduction in Area: -1366.8% 6 % Reduction in Volume: -1368.6% 0.2 Epithelialization: Small (1-33%) 2 Tunneling: No 25.918 Undermining: No 5.184 Wound Description Classification: Full Thickness With Exposed Suppor Wound Margin: Distinct, outline attached Exudate Amount: Medium Exudate Type: Serosanguineous Exudate Color: red, brown t  Structures Foul Odor After Cleansing: No Slough/Fibrino Yes Wound Bed Granulation Amount: Large (67-100%) Exposed Structure Granulation Quality: Red, Pink Fascia Exposed: No Necrotic Amount: Small (1-33%) Fat Layer (Subcutaneous Tissue) Exposed: Yes Necrotic Quality: Adherent Slough Tendon Exposed: No Muscle Exposed: No Joint Exposed: No Bone Exposed: No Periwound Skin Texture Texture Color No Abnormalities Noted: No No Abnormalities Noted: No Callus: No Atrophie Blanche: No Crepitus: No Cyanosis: No Excoriation: No Ecchymosis: No Induration: No Erythema: No Rash: No Hemosiderin Staining: No Scarring: No Mottled: No Pallor: No Moisture Rubor: No No Abnormalities Noted: No Dry / Scaly: No Temperature / Pain Maceration: No Temperature: No Abnormality Tenderness on Palpation: Yes Electronic Signature(s) Signed: 10/28/2021 4:39:54 PM By: Erenest Blank Signed: 10/29/2021 4:29:25 PM By: Rhae Hammock RN Entered By: Erenest Blank on 10/28/2021 09:22:52 Takeshita, Kim T ([id number]) [id number] Page 7 of 7 -------------------------------------------------------------------------------- Vitals Details Patient Name: Date of Service: TANAYAH, [name] 10/28/2021 9:15 A M Medical Record Number: [id number] Patient Account Number: [id number] Date of Birth/Sex: Treating RN: 03-24-1940 [phone]  y.o. [name], [name] Primary Care Jazmen Lindenbaum: [name] Other Clinician: Referring Henretta Quist: Treating Philo Kurtz/Extender: [name] Weeks in Treatment: 1 Vital Signs Time Taken: 09:12 Temperature (F): 97.8 Pulse (bpm): 65 Respiratory Rate (breaths/min): 20 Blood Pressure (mmHg): 160/79 Reference Range: 80 - 120 mg / dl Electronic Signature(s) Signed: 10/28/2021 4:39:54 PM By: Erenest Blank Entered By: Erenest Blank on 10/28/2021 09:12:20

## 2021-11-04 ENCOUNTER — Encounter (HOSPITAL_BASED_OUTPATIENT_CLINIC_OR_DEPARTMENT_OTHER): Payer: Medicare Other | Admitting: Internal Medicine

## 2021-11-04 DIAGNOSIS — I776 Arteritis, unspecified: Secondary | ICD-10-CM | POA: Diagnosis not present

## 2021-11-04 DIAGNOSIS — I87331 Chronic venous hypertension (idiopathic) with ulcer and inflammation of right lower extremity: Secondary | ICD-10-CM | POA: Diagnosis not present

## 2021-11-04 DIAGNOSIS — L97312 Non-pressure chronic ulcer of right ankle with fat layer exposed: Secondary | ICD-10-CM | POA: Diagnosis not present

## 2021-11-04 DIAGNOSIS — I10 Essential (primary) hypertension: Secondary | ICD-10-CM | POA: Diagnosis not present

## 2021-11-05 NOTE — Progress Notes (Signed)
TERRINA, DOCTER ([id number]) [id number] Page 1 of 5 Visit Report for 11/04/2021 HPI Details Patient Name: Date of Service: [name], [name]. 11/04/2021 8:00 A M Medical Record Number: [id number] Patient Account Number: [id number] Date of Birth/Sex: Treating RN: 19-Oct-1940 (81 y.o. [name], [name] Primary Care Provider: [name] Other Clinician: Referring Provider: Treating Provider/Extender: Lina Sayre, Purcell Mouton Weeks in Treatment: 2 History of Present Illness HPI Description: 10-21-2021 upon evaluation today patient appears to be doing poorly in regard to the wound over the medial aspect of her right ankle. This is an area that previously she has had trouble with previously. I have actually seen her in [place] about a year ago but this was on the left ankle at that time. 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 This is also the typical spot for her as it is a very difficult area to compress. Nonetheless previously we have been able to get this under control using her compression socks as well as Xeroform gauze although I am not sure that is can be the best thing for her at this time we discussed that as we proceed. 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 Nonetheless I do think that we could potentially try some oral steroids, prednisone, to see if we can help calm this down. She is not opposed to this at all. In fact she would like to do anything she can to try to get better she tells me the Achilles area has been very painful at times for her. I really do not see  any signs of infection right now but that symptomatic = as well. 10/18; patient with wounds looks a lot better today measuring smaller. She went to her grandsons wedding this weekend did not wear compression wraps. She completed her course of prednisone 50 mg we have been using TCA and Hydrofera Blue on the wound area. Electronic Signature(s) Signed: 11/04/2021 4:28:13 PM By: Linton Ham MD Entered By: Linton Ham on 11/04/2021 08:50:07 -------------------------------------------------------------------------------- Physical Exam Details Patient Name: Date of Service: [name], [name]. 11/04/2021 8:00 A M Medical Record Number: [id number] Patient Account Number: [id number] Date of Birth/Sex: Treating RN: Jul 11, 1940 (81 y.o. [name], [name] Primary Care Provider: [name] Other Clinician: Referring Provider: Treating Provider/Extender: Earnest Conroy Weeks in Treatment: 2 Constitutional Patient is hypertensive.. Pulse regular and within target range for patient.[name] Respirations regular, non-labored and within target range.. Temperature is normal and within the target range for the patient.[name] Appears in no distress. Cardiovascular Pedal pulses are palpable. Not much in the way of edema. Notes Wound exam; the patient's wound area is smaller. This is on the right medial ankle. The surrounding tissue has what looks to be "atrophie blanche] the white plaques that are characteristic of livedoid vasculitis but also can be seen in chronic venous insufficiency without the inflammation in the blood vessels. In any case she clearly has severe venous hypertension dilated veins venules Electronic Signature(s) Signed: 11/04/2021 4:28:13 PM By: Linton Ham MD Entered By: Linton Ham on 11/04/2021 08:52:08 [name] ([id number]) [id number] Page 2  of  5 -------------------------------------------------------------------------------- Physician Orders Details Patient Name: Date of Service: [name], [name]. 11/04/2021 8:00 A M Medical Record Number: [id number] Patient Account Number: [id number] Date of Birth/Sex: Treating RN: Nov 23, 1940 (81 y.o. [name], [name] Primary Care Provider: [name] Other Clinician: Referring Provider: Treating Provider/Extender: Earnest Conroy Weeks in Treatment: 2 Verbal / Phone Orders: No Diagnosis Coding Follow-up Appointments ppointment in 1 week. - w/ Jeri Cos and Lauran Rm # 9 Return A Anesthetic (In clinic) Topical Lidocaine 5% applied to wound bed Bathing/ Shower/ Hygiene May shower with protection but do not get wound dressing(s) wet. Edema Control - Lymphedema / SCD / Other Elevate legs to the level of the heart or above for 30 minutes daily and/or when sitting, a frequency of: Avoid standing for long periods of time. Patient to wear own compression stockings every day. Wound Treatment Wound #1 - Malleolus Wound Laterality: Right, Medial Cleanser: Soap and Water 1 x Per ZDG/64 Days Discharge Instructions: May shower and wash wound with dial antibacterial soap and water prior to dressing change. Cleanser: Wound Cleanser 1 x Per Day/15 Days Discharge Instructions: Cleanse the wound with wound cleanser prior to applying a clean dressing using gauze sponges, not tissue or cotton balls. Peri-Wound Care: Triamcinolone 15 (g) 1 x Per Day/15 Days Discharge Instructions: Use triamcinolone 15 (g) as directed Prim Dressing: Hydrofera Blue Ready Foam, 4x5 in 1 x Per Day/15 Days ary Discharge Instructions: Apply to wound bed as instructed Secondary Dressing: Woven Gauze Sponge, Non-Sterile 4x4 in 1 x Per Day/15 Days Discharge Instructions: Apply over primary dressing as directed. Secured With: [employer], 4.5x3.1 (in/yd) 1 x Per Day/15 Days Discharge  Instructions: Secure with Kerlix as directed. Secured With: Tubigrip Size D, 3x10 (in/yd) 1 x Per Day/15 Days Electronic Signature(s) Signed: 11/04/2021 4:28:13 PM By: Linton Ham MD Signed: 11/05/2021 4:05:37 PM By: Rhae Hammock RN Entered By: Rhae Hammock on 11/04/2021 08:25:34 -------------------------------------------------------------------------------- Problem List Details Patient Name: Date of Service: [name], Kimika [name]. 11/04/2021 8:00 A M Medical Record Number: [id number] Patient Account Number: [id number] Date of Birth/Sex: Treating RN: 1940-03-03 (81 y.o. 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 11/04/2021 8:00 A M Medical Record Number: [id number] Patient Account Number: [id number] Date of Birth/Sex: Treating RN: 05-21-1940 (81 y.o. [name], [name] Primary Care Provider: [name] Other Clinician: Referring Provider: Treating Provider/Extender: Lina Sayre, Purcell Mouton Weeks in Treatment: 2 Subjective History of Present Illness (HPI) 10-21-2021 upon evaluation  today patient appears to be doing poorly in regard to the wound over the medial aspect of her right ankle. This is an area that previously she has had trouble with previously. I have actually seen her in [place] about a year ago but this was on the left ankle at that time. With that being said she does have a history  of vasculitis which is often what causes this to open up. This is also the typical spot for her as it is a very difficult area to compress. Nonetheless previously we have been able to get this under control using her compression socks as well as Xeroform gauze although I am not sure that is can be the best thing for her at this time we discussed that as we proceed. 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 Nonetheless I do think that we could potentially try some oral steroids, prednisone, to see if we can help calm this down. She is not opposed to this at all. In fact she would like to do anything she can to try to get better she tells me the Achilles area has been very painful at times for her. I really do not see any signs of infection right now but that symptomatic = as well. 10/18; patient with wounds looks a lot better today measuring smaller. She went to her grandsons wedding this weekend did not wear compression wraps. She completed her course of prednisone 50 mg we have been using TCA and Hydrofera Blue on the wound area. Objective Constitutional Patient is hypertensive.. Pulse regular and within target range for patient.[name] Respirations regular, non-labored and within target range.. Temperature is normal and within the target range for the patient.[name] Appears in no  distress. KOLBI, TOFTE ([id number]) [id number] Page 4 of 5 Vitals Time Taken: 8:14 AM, Temperature: 98 F, Pulse: 73 bpm, Respiratory Rate: 17 breaths/min, Blood Pressure: 164/81 mmHg. Cardiovascular Pedal pulses are palpable. Not much in the way of edema. General Notes: Wound exam; the patient's wound area is smaller. This is on the right medial ankle. The surrounding tissue has what looks to be "atrophie blanche] the white plaques that are characteristic of livedoid vasculitis but also can be seen in chronic venous insufficiency without the inflammation in the blood vessels. In any case she clearly has severe venous hypertension dilated veins venules Integumentary (Hair, Skin) Wound #1 status is Open. Original cause of wound was Gradually Appeared. The date acquired was: 09/30/2021. The wound has been in treatment 2 weeks. The wound is located on the Right,Medial Malleolus. The wound measures 0.9cm length x 0.5cm width x 0.2cm depth; 0.353cm^2 area and 0.071cm^3 volume. There is Fat Layer (Subcutaneous Tissue) exposed. There is no tunneling or undermining noted. There is a medium amount of serosanguineous drainage noted. The wound margin is distinct with the outline attached to the wound base. There is large (67-100%) red, pink granulation within the wound bed. There is a small (1- 33%) amount of necrotic tissue within the wound bed including Adherent Slough. The periwound skin appearance did not exhibit: Callus, Crepitus, Excoriation, Induration, Rash, Scarring, Dry/Scaly, Maceration, Atrophie Blanche, Cyanosis, Ecchymosis, Hemosiderin Staining, Mottled, Pallor, Rubor, Erythema. Periwound temperature was noted as No Abnormality. The periwound has tenderness on palpation. 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 - w/ Jeri Cos and Lauran Rm # 9 Anesthetic: (In clinic) Topical Lidocaine 5% applied to wound  bed Bathing/ Shower/ Hygiene: May shower with protection but do not get wound dressing(s) wet. Edema Control - Lymphedema / SCD / Other: Elevate legs to the level of the heart or above for 30 minutes daily and/or when sitting, a frequency of: Avoid standing for long periods of time. Patient to wear own compression stockings every day. WOUND #1: - Malleolus Wound Laterality: Right, Medial Cleanser: Soap and Water 1 x Per Day/15 Days Discharge Instructions: May shower and wash wound with dial antibacterial soap and water prior to dressing change. Cleanser: Wound Cleanser 1 x Per Day/15 Days Discharge Instructions: Cleanse the wound with wound cleanser prior to applying a clean dressing using gauze sponges, not tissue or cotton balls. Peri-Wound Care: Triamcinolone 15 (g) 1 x Per Day/15 Days Discharge Instructions: Use triamcinolone 15 (g) as directed Prim Dressing: Hydrofera Blue Ready Foam, 4x5 in 1 x Per Day/15 Days ary Discharge Instructions: Apply to wound bed as instructed Secondary Dressing: Woven Gauze Sponge, Non-Sterile 4x4 in 1 x Per Day/15 Days Discharge Instructions: Apply over primary dressing as directed. Secured With: [employer], 4.5x3.1 (in/yd) 1 x Per Day/15 Days Discharge Instructions: Secure with Kerlix as directed. Secured With: Tubigrip Size D, 3x10 (in/yd) 1 x Per Day/15 Days 1. The patient completed her prednisone I did not renew this 2. The nomenclature here is a bit difficult to follow. The whitish plaques are characteristic of livedoid vasculopathy but also can be seen in chronic venous hypertension without blood vessel inflammation. For this reason I have continued with the TCA Hydrofera Blue. Because of the severity of her chronic venous insufficiency I put her back in 3 layer  compression 3. She is wearing 20/30 stockings on her uninvolved left leg this may not be sufficient for the right leg and I brought this up with her. Electronic Signature(s) Signed: 11/04/2021 4:28:13 PM By: Linton Ham MD Entered By: Linton Ham on 11/04/2021 08:53:49 [name] ([id number]) [id number] Page 5 of 5 -------------------------------------------------------------------------------- SuperBill Details Patient Name: Date of Service: [name], [name] 11/04/2021 Medical Record Number: [id number] Patient Account Number: [id number] Date of Birth/Sex: Treating RN: 1940-11-08 (81 y.o. 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 I87.331 Chronic venous hypertension (idiopathic) with ulcer and inflammation of right lower extremity L97.312 Non-pressure chronic ulcer of right ankle with fat layer exposed Facility Procedures : CPT4 Code: [id number] Description: [place] VISIT-LEV 3 EST PT Modifier: Quantity: 1 Physician Procedures : CPT4 Code Description Modifier [id number] - WC PHYS LEVEL 4 - EST PT ICD-10 Diagnosis Description 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

## 2021-11-05 NOTE — Progress Notes (Signed)
ANN, BOHNE ([id number]) [id number] Page 1 of 9 Visit Report for 11/04/2021 Arrival Information Details Patient Name: Date of Service: [name], [name]. 11/04/2021 8:00 A M Medical Record Number: [id number] Patient Account Number: [id number] Date of Birth/Sex: Treating RN: [birth year] (81 y.o. [name], [name] Primary Care Kurtiss Wence: [name] Other Clinician: Referring Nayomi Tabron: Treating Danyell Awbrey/Extender: Earnest Conroy Weeks in Treatment: 2 Visit Information History Since Last Visit Added or deleted any medications: No Patient Arrived: Ambulatory Any new allergies or adverse reactions: No Arrival Time: 08:13 Had a fall or experienced change in No Accompanied By: husband activities of daily living that may affect Transfer Assistance: None risk of falls: Patient Identification Verified: Yes Signs or symptoms of abuse/neglect since last visito No Secondary Verification Process Completed: Yes Hospitalized since last visit: No Patient Requires Transmission-Based Precautions: No Implantable device outside of the clinic excluding No Patient Has Alerts: No cellular tissue based products placed in the center since last visit: Has Dressing in Place as Prescribed: Yes Pain Present Now: No Electronic Signature(s) Signed: 11/05/2021 4:05:37 PM By: Rhae Hammock RN Entered By: Rhae Hammock on 11/04/2021 08:14:10 -------------------------------------------------------------------------------- Clinic Level of Care Assessment Details Patient Name: Date of Service: [name], [name] 11/04/2021 8:00 A M Medical Record Number: [id number] Patient Account Number: [id number] Date of Birth/Sex: Treating RN: 18-Aug-1940 (81 y.o. [name], [name] Primary Care Karolyne Timmons: [name] Other Clinician: Referring Aralyn Nowak: Treating Aryiana Klinkner/Extender: Earnest Conroy Weeks in Treatment:  2 Clinic Level of Care Assessment Items TOOL 4 Quantity Score X- 1 0 Use when only an EandM is performed on FOLLOW-UP visit ASSESSMENTS - Nursing Assessment / Reassessment X- 1 10 Reassessment of Co-morbidities (includes updates in patient status) X- 1 5 Reassessment of Adherence to Treatment Plan ASSESSMENTS - Wound and Skin A ssessment / Reassessment X - Simple Wound Assessment / Reassessment - one wound 1 5 '[]'$  - 0 Complex Wound Assessment / Reassessment - multiple wounds '[]'$  - 0 Dermatologic / Skin Assessment (not related to wound area) ASSESSMENTS - Focused Assessment X- 1 5 Circumferential Edema Measurements - multi extremities '[]'$  - 0 Nutritional Assessment / Counseling / Intervention FEIGA, NADEL T ([id number]) [id number] Page 2 of 9 '[]'$  - 0 Lower Extremity Assessment (monofilament, tuning fork, pulses) '[]'$  - 0 Peripheral Arterial Disease Assessment (using hand held doppler) ASSESSMENTS - Ostomy and/or Continence Assessment and Care '[]'$  - 0 Incontinence Assessment and Management '[]'$  - 0 Ostomy Care Assessment and Management (repouching, etc.) PROCESS - Coordination of Care X - Simple Patient / Family Education for ongoing care 1 15 '[]'$  - 0 Complex (extensive) Patient / Family Education for ongoing care X- 1 10 Staff obtains Programmer, systems, Records, T Results / Process Orders est '[]'$  - 0 Staff telephones HHA, Nursing Homes / Clarify orders / etc '[]'$  - 0 Routine Transfer to another Facility (non-emergent condition) '[]'$  - 0 Routine Hospital Admission (non-emergent condition) '[]'$  - 0 New Admissions / Biomedical engineer / Ordering NPWT Apligraf, etc. , '[]'$  - 0 Emergency Hospital Admission (emergent condition) X- 1 10 Simple Discharge Coordination '[]'$  - 0 Complex (extensive) Discharge Coordination PROCESS - Special Needs '[]'$  - 0 Pediatric / Minor Patient Management '[]'$  - 0 Isolation Patient Management '[]'$  - 0 Hearing / Language / Visual special  needs '[]'$  - 0 Assessment of Community assistance (transportation, D/C planning, etc.) '[]'$  - 0 Additional assistance / Altered mentation '[]'$  - 0 Support Surface(s) Assessment (bed, cushion, seat, etc.) INTERVENTIONS - Wound Cleansing /  Measurement X - Simple Wound Cleansing - one wound 1 5 '[]'$  - 0 Complex Wound Cleansing - multiple wounds X- 1 5 Wound Imaging (photographs - any number of wounds) '[]'$  - 0 Wound Tracing (instead of photographs) X- 1 5 Simple Wound Measurement - one wound '[]'$  - 0 Complex Wound Measurement - multiple wounds INTERVENTIONS - Wound Dressings '[]'$  - 0 Small Wound Dressing one or multiple wounds X- 1 15 Medium Wound Dressing one or multiple wounds '[]'$  - 0 Large Wound Dressing one or multiple wounds X- 1 5 Application of Medications - topical '[]'$  - 0 Application of Medications - injection INTERVENTIONS - Miscellaneous '[]'$  - 0 External ear exam '[]'$  - 0 Specimen Collection (cultures, biopsies, blood, body fluids, etc.) '[]'$  - 0 Specimen(s) / Culture(s) sent or taken to Lab for analysis '[]'$  - 0 Patient Transfer (multiple staff / Civil Service fast streamer / Similar devices) '[]'$  - 0 Simple Staple / Suture removal (25 or less) '[]'$  - 0 Complex Staple / Suture removal (26 or more) '[]'$  - 0 Hypo / Hyperglycemic Management (close monitor of Blood Glucose) Cannell, Pranika T ([id number]) [id number] Page 3 of 9 '[]'$  - 0 Ankle / Brachial Index (ABI) - do not check if billed separately X- 1 5 Vital Signs Has the patient been seen at the hospital within the last three years: Yes Total Score: 100 Level Of Care: New/Established - Level 3 Electronic Signature(s) Signed: 11/05/2021 4:05:37 PM By: Rhae Hammock RN Entered By: Rhae Hammock on 11/04/2021 08:27:09 -------------------------------------------------------------------------------- Encounter Discharge Information Details Patient Name: Date of Service: [name], [name] 11/04/2021 8:00 A  M Medical Record Number: [id number] Patient Account Number: [id number] Date of Birth/Sex: Treating RN: 1940/10/08 (81 y.o. [name], [name] Primary Care Lawson Mahone: [name] Other Clinician: Referring Adylene Dlugosz: Treating Quandre Polinski/Extender: Earnest Conroy Weeks in Treatment: 2 Encounter Discharge Information Items Discharge Condition: Stable Ambulatory Status: Ambulatory Discharge Destination: Home Transportation: Private Auto Accompanied By: husband Schedule Follow-up Appointment: Yes Clinical Summary of Care: Patient Declined Electronic Signature(s) Signed: 11/05/2021 4:05:37 PM By: Rhae Hammock RN Entered By: Rhae Hammock on 11/04/2021 08:27:44 -------------------------------------------------------------------------------- Lower Extremity Assessment Details Patient Name: Date of Service: [name], Niasia T. 11/04/2021 8:00 A M Medical Record Number: [id number] Patient Account Number: [id number] Date of Birth/Sex: Treating RN: July 15, 1940 (81 y.o. [name], [name] Primary Care Leone Putman: [name] Other Clinician: Referring Shakema Surita: Treating Leylani Duley/Extender: Earnest Conroy Weeks in Treatment: 2 Edema Assessment Assessed: [name]: No] Patrice Paradise: Yes] Edema: [Left: Ye] [Right: s] Calf Left: Right: Point of Measurement: 37 cm From Medial Instep 45 cm Ankle Left: Right: Point of Measurement: 8 cm From Medial Instep 23 cm Vascular Assessment Bellerose, Madgie T ([id number]) [Right:[id number] Page 4 of 9] Pulses: Dorsalis Pedis Palpable: [Right:Yes] Posterior Tibial Palpable: [Right:Yes] Electronic Signature(s) Signed: 11/05/2021 4:05:37 PM By: Rhae Hammock RN Entered By: Rhae Hammock on 11/04/2021 08:15:27 -------------------------------------------------------------------------------- Multi Wound Chart Details Patient Name: Date of Service: [name], Fabiha T.  11/04/2021 8:00 A M Medical Record Number: [id number] Patient Account Number: [id number] Date of Birth/Sex: Treating RN: November 01, 1940 (81 y.o. [name], [name] Primary Care Catalina Salasar: [name] Other Clinician: Referring Yosef Krogh: Treating Paytin Ramakrishnan/Extender: Earnest Conroy Weeks in Treatment: 2 Vital Signs Height(in): Pulse(bpm): 38 Weight(lbs): Blood Pressure(mmHg): 164/81 Body Mass Index(BMI): Temperature(F): 98 Respiratory Rate(breaths/min): 17 [1:Photos:] [N/A:N/A] Right, Medial Malleolus N/A N/A Wound Location: Gradually Appeared N/A N/A Wounding Event: Venous Leg Ulcer N/A N/A Primary Etiology: Peripheral Venous Disease N/A  N/A Comorbid History: 09/30/2021 N/A N/A Date Acquired: 2 N/A N/A Weeks of Treatment: Open N/A N/A Wound Status: No N/A N/A Wound Recurrence: Yes N/A N/A Clustered Wound: 0.9x0.5x0.2 N/A N/A Measurements L x W x D (cm) 0.353 N/A N/A A (cm) : rea 0.071 N/A N/A Volume (cm) : 80.00% N/A N/A % Reduction in A rea: 79.90% N/A N/A % Reduction in Volume: Full Thickness With Exposed Support N/A N/A Classification: Structures Medium N/A N/A Exudate Amount: Serosanguineous N/A N/A Exudate Type: red, brown N/A N/A Exudate Color: Distinct, outline attached N/A N/A Wound Margin: Large (67-100%) N/A N/A Granulation Amount: Red, Pink N/A N/A Granulation Quality: Small (1-33%) N/A N/A Necrotic Amount: Fat Layer (Subcutaneous Tissue): Yes N/A N/A Exposed Structures: Fascia: No Tendon: No Muscle: No Joint: No Bone: No Small (1-33%) N/A N/A Epithelialization: Excoriation: No N/A N/A Periwound Skin Texture: Induration: No Callus: No ZIKERIA, KEOUGH T ([id number]) [id number] Page 5 of 9 Crepitus: No Rash: No Scarring: No Maceration: No N/A N/A Periwound Skin Moisture: Dry/Scaly: No Atrophie Blanche: No N/A N/A Periwound Skin Color: Cyanosis: No Ecchymosis:  No Erythema: No Hemosiderin Staining: No Mottled: No Pallor: No Rubor: No No Abnormality N/A N/A Temperature: Yes N/A N/A Tenderness on Palpation: Treatment Notes Wound #1 (Malleolus) Wound Laterality: Right, Medial Cleanser Soap and Water Discharge Instruction: May shower and wash wound with dial antibacterial soap and water prior to dressing change. Wound Cleanser Discharge Instruction: Cleanse the wound with wound cleanser prior to applying a clean dressing using gauze sponges, not tissue or cotton balls. Peri-Wound Care Triamcinolone 15 (g) Discharge Instruction: Use triamcinolone 15 (g) as directed Topical Primary Dressing Hydrofera Blue Ready Foam, 4x5 in Discharge Instruction: Apply to wound bed as instructed Secondary Dressing Woven Gauze Sponge, Non-Sterile 4x4 in Discharge Instruction: Apply over primary dressing as directed. Secured With [employer], 4.5x3.1 (in/yd) Discharge Instruction: Secure with Kerlix as directed. Tubigrip Size D, 3x10 (in/yd) Compression Wrap Compression Stockings Add-Ons Electronic Signature(s) Signed: 11/04/2021 4:28:13 PM By: Linton Ham MD Signed: 11/05/2021 4:05:37 PM By: Rhae Hammock RN Entered By: Linton Ham on 11/04/2021 08:49:08 -------------------------------------------------------------------------------- Multi-Disciplinary Care Plan Details Patient Name: Date of Service: [name], Marilou T. 11/04/2021 8:00 A M Medical Record Number: [id number] Patient Account Number: [id number] Date of Birth/Sex: Treating RN: [birth year] (81 y.o. [name], [name] Primary Care Sandro Burgo: [name] Other Clinician: Referring Tali Cleaves: Treating Emoni Whitworth/Extender: Earnest Conroy Weeks in Treatment: 2 Active Inactive YUMA, BLUCHER T ([id number]) [id number] Page 6 of 9 Wound/Skin Impairment Nursing Diagnoses: Impaired tissue integrity Knowledge deficit  related to smoking impact on wound healing Goals: Patient will have a decrease in wound volume by X% from date: (specify in notes) Date Initiated: 10/21/2021 Target Resolution Date: 11/20/2021 Goal Status: Active Patient/caregiver will verbalize understanding of skin care regimen Date Initiated: 10/21/2021 Target Resolution Date: 11/21/2021 Goal Status: Active Ulcer/skin breakdown will have a volume reduction of 30% by week 4 Date Initiated: 10/21/2021 Target Resolution Date: 11/20/2021 Goal Status: Active Interventions: Assess patient/caregiver ability to obtain necessary supplies Assess patient/caregiver ability to perform ulcer/skin care regimen upon admission and as needed Assess ulceration(s) every visit Notes: Electronic Signature(s) Signed: 11/05/2021 4:05:37 PM By: Rhae Hammock RN Entered By: Rhae Hammock on 11/04/2021 08:26:02 -------------------------------------------------------------------------------- Pain Assessment Details Patient Name: Date of Service: [name], Madissen T. 11/04/2021 8:00 A M Medical Record Number: [id number] Patient Account Number: [id number] Date of Birth/Sex: Treating RN: 1940-07-04 (81 y.o. [name], [place] Primary Care Adien Kimmel: [name] Other  Clinician: Referring Laddie Naeem: Treating Antonisha Waskey/Extender: Lina Sayre, Purcell Mouton Weeks in Treatment: 2 Active Problems Location of Pain Severity and Description of Pain Patient Has Paino No Site Locations Pain Management and Medication Current Pain Management: Electronic Signature(s) Signed: 11/05/2021 4:05:37 PM By: Rhae Hammock RN [name], Vassie T ([id number]) PM By: Rhae Hammock RN [phone].pdf Page 7 of 9 Signed: 11/05/2021 4:05:37 Entered By: Rhae Hammock on 11/04/2021 08:15:15 -------------------------------------------------------------------------------- Patient/Caregiver Education Details Patient Name: Date of  Service: Denzil Hughes 10/18/2023andnbsp8:00 A M Medical Record Number: [id number] Patient Account Number: [id number] Date of Birth/Gender: Treating RN: 1940/03/24 (81 y.o. [name], [name] Primary Care Physician: [name] Other Clinician: Referring Physician: Treating Physician/Extender: Veneta Penton in Treatment: 2 Education Assessment Education Provided To: Patient Education Topics Provided Wound/Skin Impairment: Methods: Explain/Verbal Responses: Reinforcements needed, State content correctly Electronic Signature(s) Signed: 11/05/2021 4:05:37 PM By: Rhae Hammock RN Entered By: Rhae Hammock on 11/04/2021 08:26:18 -------------------------------------------------------------------------------- Wound Assessment Details Patient Name: Date of Service: [name], Breuna T. 11/04/2021 8:00 A M Medical Record Number: [id number] Patient Account Number: [id number] Date of Birth/Sex: Treating RN: [birth year] (81 y.o. [name], [name] Primary Care Lekia Nier: [name] Other Clinician: Referring Bradley Bostelman: Treating Raysha Tilmon/Extender: Earnest Conroy Weeks in Treatment: 2 Wound Status Wound Number: 1 Primary Etiology: Venous Leg Ulcer Wound Location: Right, Medial Malleolus Wound Status: Open Wounding Event: Gradually Appeared Comorbid History: Peripheral Venous Disease Date Acquired: 09/30/2021 Weeks Of Treatment: 2 Clustered Wound: Yes Photos [place], HEYDEN T ([id number]) [id number] Page 8 of 9 Wound Measurements Length: (cm) 0.9 Width: (cm) 0.5 Depth: (cm) 0.2 Area: (cm) 0.353 Volume: (cm) 0.071 % Reduction in Area: 80% % Reduction in Volume: 79.9% Epithelialization: Small (1-33%) Tunneling: No Undermining: No Wound Description Classification: Full Thickness With Exposed Support Structures Wound Margin: Distinct, outline attached Exudate Amount:  Medium Exudate Type: Serosanguineous Exudate Color: red, brown Foul Odor After Cleansing: No Slough/Fibrino Yes Wound Bed Granulation Amount: Large (67-100%) Exposed Structure Granulation Quality: Red, Pink Fascia Exposed: No Necrotic Amount: Small (1-33%) Fat Layer (Subcutaneous Tissue) Exposed: Yes Necrotic Quality: Adherent Slough Tendon Exposed: No Muscle Exposed: No Joint Exposed: No Bone Exposed: No Periwound Skin Texture Texture Color No Abnormalities Noted: No No Abnormalities Noted: No Callus: No Atrophie Blanche: No Crepitus: No Cyanosis: No Excoriation: No Ecchymosis: No Induration: No Erythema: No Rash: No Hemosiderin Staining: No Scarring: No Mottled: No Pallor: No Moisture Rubor: No No Abnormalities Noted: No Dry / Scaly: No Temperature / Pain Maceration: No Temperature: No Abnormality Tenderness on Palpation: Yes Treatment Notes Wound #1 (Malleolus) Wound Laterality: Right, Medial Cleanser Soap and Water Discharge Instruction: May shower and wash wound with dial antibacterial soap and water prior to dressing change. Wound Cleanser Discharge Instruction: Cleanse the wound with wound cleanser prior to applying a clean dressing using gauze sponges, not tissue or cotton balls. Peri-Wound Care Triamcinolone 15 (g) Discharge Instruction: Use triamcinolone 15 (g) as directed Topical Primary Dressing Hydrofera Blue Ready Foam, 4x5 in Discharge Instruction: Apply to wound bed as instructed Secondary Dressing Woven Gauze Sponge, Non-Sterile 4x4 in Discharge Instruction: Apply over primary dressing as directed. Secured With [employer], 4.5x3.1 (in/yd) Discharge Instruction: Secure with Kerlix as directed. Tubigrip Size D, 3x10 (in/yd) Compression Wrap Compression Stockings Add-Ons Electronic Signature(s) SERRA, YOUNAN T ([id number]) [id number] Page 9 of 9 Signed: 11/05/2021 4:05:37 PM By: Rhae Hammock  RN Entered By: Rhae Hammock on 11/04/2021 08:20:28 -------------------------------------------------------------------------------- Vitals Details Patient Name: Date of Service:  CLA Meyer Cory, Vedika T. 11/04/2021 8:00 A M Medical Record Number: [id number] Patient Account Number: [id number] Date of Birth/Sex: Treating RN: 05-Feb-1940 (81 y.o. [name], [name] Primary Care Brevan Luberto: [name] Other Clinician: Referring [place] Spees: Treating Johnmichael Melhorn/Extender: Earnest Conroy Weeks in Treatment: 2 Vital Signs Time Taken: 08:14 Temperature (F): 98 Pulse (bpm): 73 Respiratory Rate (breaths/min): 17 Blood Pressure (mmHg): 164/81 Reference Range: 80 - 120 mg / dl Electronic Signature(s) Signed: 11/05/2021 4:05:37 PM By: Rhae Hammock RN Entered By: Rhae Hammock on 11/04/2021 08:14:27

## 2021-11-08 ENCOUNTER — Encounter: Payer: Self-pay | Admitting: Family Medicine

## 2021-11-11 ENCOUNTER — Encounter (HOSPITAL_BASED_OUTPATIENT_CLINIC_OR_DEPARTMENT_OTHER): Payer: Medicare Other | Admitting: Physician Assistant

## 2021-11-11 DIAGNOSIS — I87331 Chronic venous hypertension (idiopathic) with ulcer and inflammation of right lower extremity: Secondary | ICD-10-CM | POA: Diagnosis not present

## 2021-11-11 DIAGNOSIS — I776 Arteritis, unspecified: Secondary | ICD-10-CM | POA: Diagnosis not present

## 2021-11-11 DIAGNOSIS — I10 Essential (primary) hypertension: Secondary | ICD-10-CM | POA: Diagnosis not present

## 2021-11-11 DIAGNOSIS — L97312 Non-pressure chronic ulcer of right ankle with fat layer exposed: Secondary | ICD-10-CM | POA: Diagnosis not present

## 2021-11-11 NOTE — Progress Notes (Signed)
[name], [name] ([id number]) [id number] Page 1 of 6 Visit Report for 11/11/2021 Chief Complaint Document Details Patient Name: Date of Service: [name], [name]. 11/11/2021 10:00 A M Medical Record Number: [id number] Patient Account Number: [id number] Date of Birth/Sex: Treating RN: 1941/01/05 (81 y.o. F) Primary Care Provider: [name] Other Clinician: Referring Provider: Treating Provider/Extender: [name] Weeks in Treatment: 3 Information Obtained from: Patient Chief Complaint Right ankle ulcer Electronic Signature(s) Signed: 11/11/2021 9:41:56 AM By: [name] PA-C Entered By: [name] on 11/11/2021 09:41:55 -------------------------------------------------------------------------------- HPI Details Patient Name: Date of Service: [name], [name]. 11/11/2021 10:00 A M Medical Record Number: [id number] Patient Account Number: [id number] Date of Birth/Sex: Treating RN: 08-01-1940 (81 y.o. F) Primary Care Provider: [name] Other Clinician: Referring Provider: Treating Provider/Extender: [name] Weeks in Treatment: 3 History of Present Illness HPI Description: 10-21-2021 upon evaluation today patient appears to be doing poorly in regard to the wound over the medial aspect of her right ankle. This is an area that previously she has had trouble with previously. I have actually seen her in [place] about a year ago but this was on the left ankle at that time. 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 This is also the typical spot for her as it is a very difficult area to compress. Nonetheless previously we have been able to get this under control using her compression socks as well as Xeroform gauze although I am not sure that is can be the best thing for her at this time we discussed that as we  proceed. 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 Nonetheless I do think that we could potentially try some oral steroids, prednisone, to see if we can help calm this down. She is not opposed to this at all. In fact she would like to do anything she can to try to get better she tells me the Achilles area has been very painful at times for her. I really do not see any signs of infection right now but that symptomatic = as well. 10/18; patient with wounds looks a lot better today measuring smaller. She went to her grandsons wedding this weekend did not wear compression wraps. She completed her course of prednisone 50 mg we have been using TCA and Hydrofera Blue on the wound area. 11-11-2021 upon evaluation today patient appears to be doing well currently in regard to her wound in fact she is showing signs of good granulation and epithelization at this point and I am very pleased with where we stand currently. I do not see any signs of infection locally or systemically which is great news. No fevers, chills, nausea, vomiting, or diarrhea. I do believe that the anti-inflammatories did do well for her. Electronic Signature(s) Signed: 11/11/2021 10:17:27 AM By: [name] PA-C Entered By: [name] on 11/11/2021 10:17:27 [name] ([id number]) [id number] Page 2 of 6 -------------------------------------------------------------------------------- Physical Exam Details Patient Name: Date of Service: [name], [name]. 11/11/2021 10:00 A M Medical Record Number: [id number]  Patient Account Number: [id number] Date of Birth/Sex: Treating RN: 11/03/1940 (81 y.o. F) Primary Care Provider: [name] Other Clinician: Referring Provider: Treating Provider/Extender: [name] Weeks in Treatment: 3 Constitutional Well-nourished and well-hydrated in no acute distress. Respiratory normal breathing without difficulty. Psychiatric this patient is able to make decisions and demonstrates good insight into disease process. Alert and Oriented x 3. pleasant and cooperative. Notes Upon inspection patient's wound bed actually showed signs of good granulation and epithelization at this point. Fortunately I do not see any signs of active infection locally or systemically which is great news and overall I am extremely pleased with where things stand at this point. Electronic Signature(s) Signed: 11/11/2021 10:17:45 AM By: [name] PA-C Entered By: [name] on 11/11/2021 10:17:45 -------------------------------------------------------------------------------- Physician Orders Details Patient Name: Date of Service: [name], [name]. 11/11/2021 10:00 A M Medical Record Number: [id number] Patient Account Number: [id number] Date of Birth/Sex: Treating RN: November 12, 1940 (81 y.o. 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 - w/ Jeri Cos, PA Wednesday Return A Anesthetic (In clinic) Topical Lidocaine 5% applied to wound bed Bathing/ Shower/ Hygiene May shower with protection but do not get wound dressing(s) wet. Edema Control - Lymphedema / SCD /  Other Elevate legs to the level of the heart or above for 30 minutes daily and/or when sitting, a frequency of: - 3-4 times a day throughout the day. Avoid standing for long periods of time. Exercise regularly Wound Treatment Wound #1 - Malleolus Wound Laterality: Right, Medial [name], [name] ([id number]) [phone].pdf Page 3 of 6 Cleanser: Soap and Water 1 x Per Week/15 Days Discharge Instructions: May shower and wash wound with dial antibacterial soap and water prior to dressing change. Cleanser: Wound Cleanser 1 x Per Week/15 Days Discharge Instructions: Cleanse the wound with wound cleanser prior to applying a clean dressing using gauze sponges, not tissue or cotton balls. Peri-Wound Care: Sween Lotion (Moisturizing lotion) 1 x Per Week/15 Days Discharge Instructions: Apply moisturizing lotion as directed Topical: Triamcinolone 1 x Per Week/15 Days Discharge Instructions: Apply Triamcinolone directly to wound bed. Prim Dressing: Hydrofera Blue Ready Foam, 4x5 in 1 x Per Week/15 Days ary Discharge Instructions: Apply to wound bed as instructed Secondary Dressing: Woven Gauze Sponge, Non-Sterile 4x4 in 1 x Per Week/15 Days Discharge Instructions: Apply over primary dressing as directed. Compression Wrap: ThreePress (3 layer compression wrap) 1 x Per Week/15 Days Discharge Instructions: Apply three layer compression as directed. Electronic Signature(s) Signed: 11/11/2021 6:42:42 PM By: [name] PA-C Signed: 11/11/2021 6:57:47 PM By: Deon Pilling RN, BSN Entered By: Deon Pilling on 11/11/2021 10:03:42 -------------------------------------------------------------------------------- Problem List Details Patient Name: Date of Service: [name], [place] [name]. 11/11/2021 10:00 A M Medical Record Number: [id number] Patient Account Number: [id number] Date of Birth/Sex: Treating RN: 1940-08-05 (81 y.o. 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 10/28/2021 No Yes I87.331 Chronic venous hypertension (idiopathic) with ulcer and inflammation  of right 10/21/2021 No Yes lower extremity L97.312 Non-pressure chronic ulcer of right ankle with fat layer exposed 10/21/2021 No Yes Inactive Problems Resolved Problems Electronic Signature(s) Signed: 11/11/2021 9:41:50 AM By: [name] PA-C Entered By: [name] on 11/11/2021 09:41:50 Sonneborn, Rindy [name] ([id number]) [id number] Page 4 of 6 -------------------------------------------------------------------------------- Progress Note Details Patient Name: Date of Service: [name], [name]. 11/11/2021 10:00 A M Medical Record Number: [id number] Patient Account Number: [id number] Date of Birth/Sex: Treating RN: 15-Dec-1940 (81 y.o. F) Primary Care Provider: [name] Other Clinician: Referring Provider: Treating Provider/Extender: [name] Weeks in Treatment: 3 Subjective Chief Complaint Information obtained from Patient Right ankle ulcer History of Present Illness (HPI) 10-21-2021 upon evaluation today patient appears to be doing poorly in regard to the wound over the medial aspect of her right ankle. This is an area that previously she has had trouble with previously. I have actually seen her in [place] about a year ago but this was on the left ankle at that time. 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 This is also the typical spot for her as it is a very difficult area to compress. Nonetheless previously we have been able to get this under control using her compression socks as well as Xeroform gauze although I am not sure that  is can be the best thing for her at this time we discussed that as we proceed. 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 Nonetheless I do think that we could potentially try some oral steroids, prednisone, to see if we can help calm this down. She is not opposed to this at all. In fact she would like to do anything she can to try to get better she tells me the Achilles area has been very painful at times for her. I really do not see any signs of infection right now but that symptomatic = as well. 10/18; patient with wounds looks a lot better today measuring smaller. She went to her grandsons wedding this weekend did not wear compression wraps. She completed her course of prednisone 50 mg we have been using TCA and Hydrofera Blue on the wound area. 11-11-2021 upon evaluation today patient appears to be doing well currently in regard to her wound in fact she is showing signs of good granulation and epithelization at this point and I am very pleased with where we stand currently. I do not see any signs of infection locally or systemically which is great news. No fevers, chills, nausea, vomiting, or diarrhea. I do believe that the anti-inflammatories did do well for her. Objective Constitutional Well-nourished and well-hydrated in no acute distress. Vitals Time Taken: 9:41 AM, Temperature: 98.0 F, Pulse: 71 bpm, Respiratory Rate: 18 breaths/min, Blood Pressure: 151/81 mmHg. Respiratory normal breathing without difficulty. Psychiatric this patient is able to make decisions and demonstrates good insight into disease process. Alert and Oriented x 3. pleasant and cooperative. General Notes: Upon  inspection patient's wound bed actually showed signs of good granulation and epithelization at this point. Fortunately I do not see any signs of active infection  locally or systemically which is great news and overall I am extremely pleased with where things stand at this point. Integumentary (Hair, Skin) Wound #1 status is Open. Original cause of wound was Gradually Appeared. The date acquired was: 09/30/2021. The wound has been in treatment 3 weeks. The wound is located on the Right,Medial Malleolus. The wound measures 1cm length x 1.1cm width x 0.2cm depth; 0.864cm^2 area and 0.173cm^3 volume. There is Fat Layer (Subcutaneous Tissue) exposed. There is no tunneling or undermining noted. There is a medium amount of serosanguineous drainage noted. The wound margin is distinct with the outline attached to the wound base. There is large (67-100%) red, pink granulation within the wound bed. There is a small (1- 33%) amount of necrotic tissue within the wound bed including Adherent Slough. The periwound skin appearance did not exhibit: Callus, Crepitus, Excoriation, Induration, Rash, Scarring, Dry/Scaly, Maceration, Atrophie Blanche, Cyanosis, Ecchymosis, Hemosiderin Staining, Mottled, Pallor, Rubor, Erythema. Periwound temperature was noted as No Abnormality. The periwound has tenderness on palpation. 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 There was a Three Layer Compression Therapy Procedure by Deon Pilling, RN. Post procedure Diagnosis Wound #1: Same as Pre-Procedure Plan Follow-up Appointments: Return Appointment in 1 week. -  w/ Jeri Cos, PA Wednesday Anesthetic: (In clinic) Topical Lidocaine 5% applied to wound bed Bathing/ Shower/ Hygiene: May shower with protection but do not get wound dressing(s) wet. Edema Control - Lymphedema / SCD / Other: Elevate legs to the level of the heart or above for 30 minutes daily and/or when sitting, a frequency of: - 3-4 times a day throughout the day. Avoid standing for long periods of time. Exercise regularly WOUND #1: - Malleolus Wound Laterality: Right, Medial Cleanser: Soap and Water 1 x Per Week/15 Days Discharge Instructions: May shower and wash wound with dial antibacterial soap and water prior to dressing change. Cleanser: Wound Cleanser 1 x Per Week/15 Days Discharge Instructions: Cleanse the wound with wound cleanser prior to applying a clean dressing using gauze sponges, not tissue or cotton balls. Peri-Wound Care: Sween Lotion (Moisturizing lotion) 1 x Per Week/15 Days Discharge Instructions: Apply moisturizing lotion as directed Topical: Triamcinolone 1 x Per Week/15 Days Discharge Instructions: Apply Triamcinolone directly to wound bed. Prim Dressing: Hydrofera Blue Ready Foam, 4x5 in 1 x Per Week/15 Days ary Discharge Instructions: Apply to wound bed as instructed Secondary Dressing: Woven Gauze Sponge, Non-Sterile 4x4 in 1 x Per Week/15 Days Discharge Instructions: Apply over primary dressing as directed. Com pression Wrap: ThreePress (3 layer compression wrap) 1 x Per Week/15 Days Discharge Instructions: Apply three layer compression as directed. 1. I am going to suggest that we have the patient going continue to monitor for any signs of worsening or infection. Obviously based on what I am seeing I do believe that she seems to be doing really well currently and I am glad that things are getting so close to complete resolution. With that being said we will continue with the triamcinolone to the wound and periwound as well as the Hydrofera Blue. 2. I am  also can recommend we continue with the 3 layer compression wrap which I think is still good to be the optimal way to go here. We  will see patient back for reevaluation in 1 week here in the clinic. If anything worsens or changes patient will contact our office for additional recommendations. Electronic Signature(s) Signed: 11/11/2021 10:18:33 AM By: [name] PA-C Entered By: [name] on 11/11/2021 10:18:33 -------------------------------------------------------------------------------- SuperBill Details Patient Name: Date of Service: Kim Lance [name]. 11/11/2021 Medical Record Number: [id number] Patient Account Number: [id number] Date of Birth/Sex: Treating RN: 1940/03/07 (81 y.o. 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.331 Chronic venous hypertension (idiopathic) with ulcer and inflammation of right lower extremity L97.312 Non-pressure chronic ulcer of right ankle with fat layer exposed Facility Procedures : CPT4 Code: [id number] Description: (Facility Use Only) [id number] - APPLY [facility] LWR RT LEG Modifier: Quantity: 1 Physician Procedures : CPT4 Code Description Modifier [id number] - WC PHYS LEVEL 3 - EST PT ICD-10 Diagnosis Description 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

## 2021-11-12 NOTE — Progress Notes (Signed)
Kim [name] [name] ([id number]) [id number] Page 1 of 6 Visit Report for 11/11/2021 Arrival Information [name] Patient Name: Date of Service: Kim [name], Kim [name] 11/11/2021 10:00 A M Medical Record Number: [id number] Patient Account Number: [id number] Date of Birth/Sex: Treating RN: 1940/09/01 (81 y.o. Kim [name], Kim [name] Primary Care Kim [name]: Kim [name]: Referring Kim [name]: Treating Kim [name]/Extender: Kim [name] Kim [name]: 3 Visit Information History Since Last Visit Added or deleted any medications: No Patient Arrived: Ambulatory Any new allergies or adverse reactions: No Arrival Time: 09:40 Had a fall or experienced change in No Accompanied By: self activities of daily living that may affect Transfer Assistance: None risk of falls: Patient Identification Verified: Yes Signs or symptoms of abuse/neglect since last visito No Secondary Verification Process Completed: Yes Hospitalized since last visit: No Patient Requires Transmission-Based Precautions: No Implantable device outside of the clinic excluding No Patient Has Alerts: No cellular tissue based products placed in the center since last visit: Has Dressing in Place as Prescribed: Yes Pain Present Now: No Electronic Signature(s) Signed: 11/11/2021 5:59:56 PM By: Kim Creamer RN, [name] Entered By: Kim [name] on 11/11/2021 09:41:19 -------------------------------------------------------------------------------- Compression Therapy [name] Patient Name: Date of Service: Kim [name], Kim [name]. 11/11/2021 10:00 A M Medical Record Number: [id number] Patient Account Number: [id number] Date of Birth/Sex: Treating RN: 01/09/1941 (81 y.o. Kim [name], Kim [name] Primary Care Jersi Mcmaster: Kim [name]: Referring Sua Spadafora: Treating Kelbi Renstrom/Extender: Kim [name] Kim [name]: 3 Compression Therapy  Performed for Wound Assessment: Wound #1 Right,Medial Malleolus Performed By: [name] Kim Pilling, RN Compression Type: Three Layer Post Procedure Diagnosis Same as Pre-procedure Electronic Signature(s) Signed: 11/11/2021 6:57:47 PM By: Kim Pilling RN, [name] Entered By: Kim [name] on 11/11/2021 10:04:29 Kim [name] [name] (Kim [name]) [id number] Page 2 of 6 -------------------------------------------------------------------------------- Encounter Discharge Information [name] Patient Name: Date of Service: [name], [name] 11/11/2021 10:00 A M Medical Record Number: [id number] Patient Account Number: [id number] Date of Birth/Sex: Treating RN: 1940-10-25 (81 y.o. Kim [name], Kim [name] Primary Care Xayden Linsey: Kim [name]: Referring Dwayne Bulkley: Treating Makinzee Durley/Extender: Kim [name] Kim [name]: 3 Encounter Discharge Information Items Discharge Condition: Stable Ambulatory Status: Ambulatory Discharge Destination: Home Transportation: Private Auto Accompanied By: husband Schedule Follow-up Appointment: Yes Clinical Summary of Care: Electronic Signature(s) Signed: 11/11/2021 6:57:47 PM By: Kim Pilling RN, [name] Entered By: Kim [name] on 11/11/2021 10:05:01 -------------------------------------------------------------------------------- Lower Extremity Assessment [name] Patient Name: Date of Service: Kim [name], Kim [name]. 11/11/2021 10:00 A M Medical Record Number: [id number] Patient Account Number: [id number] Date of Birth/Sex: Treating RN: [birth year] (81 y.o. Kim [name] Primary Care Halynn Reitano: Kim [name]: Referring Kim [name]: Treating Kim [name]/Extender: Kim [name] Kim [name]: 3 Edema Assessment Assessed: [Left: No] [Right: No] Edema: [Left: Ye] [Right: s] Calf Left: Right: Point of Measurement: 37 cm From Medial Instep 36  cm Ankle Left: Right: Point of Measurement: 8 cm From Medial Instep 21.5 cm Vascular Assessment Pulses: Dorsalis Pedis Palpable: [Right:Yes] Electronic Signature(s) Signed: 11/11/2021 5:59:56 PM By: Kim Creamer RN, [name] Entered By: Kim [name] on 11/11/2021 09:43:20 Kim [name] [name] -------------------------------------------------------------------------------- Kim [name] ([id number]) [id number] Page 3 of 6 Patient Name: Date of Service: Kim [name], Kim [name]. 11/11/2021 10:00 A M Medical Record Number: [id number] Patient Account Number: [id number] Date of Birth/Sex: Treating RN: 12/07/1940 (81 y.o. Kim [name], Kim [name] Primary Care Samari Gorby: Kim [name]: Referring Kim [name]: Treating  Kim [name]/Extender: Kim [name] Kim [name]: 3 Active Inactive Wound/Skin Impairment Nursing Diagnoses: Impaired tissue integrity Knowledge deficit related to smoking impact on wound healing Goals: Patient will have a decrease in wound volume by X% from date: (specify in notes) Date Initiated: 10/21/2021 Target Resolution Date: 12/18/2021 Goal Status: Active Patient/caregiver will verbalize understanding of skin care regimen Date Initiated: 10/21/2021 Date Inactivated: 11/11/2021 Target Resolution Date: 11/21/2021 Goal Status: Met Ulcer/skin breakdown will have a volume reduction of 30% by week 4 Date Initiated: 10/21/2021 Target Resolution Date: 11/20/2021 Goal Status: Active Interventions: Assess patient/caregiver ability to obtain necessary supplies Assess patient/caregiver ability to perform ulcer/skin care regimen upon admission and as needed Assess ulceration(s) every visit Notes: Electronic Signature(s) Signed: 11/11/2021 6:57:47 PM By: Kim Pilling RN, [name] Entered By: Kim [name] on 11/11/2021  10:01:05 -------------------------------------------------------------------------------- Pain Assessment [name] Patient Name: Date of Service: Kim [name], Kim [name]. 11/11/2021 10:00 A M Medical Record Number: [id number] Patient Account Number: [id number] Date of Birth/Sex: Treating RN: 05/25/1940 (81 y.o. Kim [name] Primary Care Nieves Barberi: Kim [name]: Referring Hadassa Cermak: Treating Zaiyah Sottile/Extender: Kim [name] Kim [name]: 3 Active Problems Location of Pain Severity and Description of Pain Patient Has Paino No Site Locations [place], [place] [name] ([id number]) [id number] Page 4 of 6 Pain Management and Medication Current Pain Management: Electronic Signature(s) Signed: 11/11/2021 5:59:56 PM By: Kim Creamer RN, [name] Entered By: Kim [name] on 11/11/2021 09:42:56 -------------------------------------------------------------------------------- Patient/Caregiver Education [name] Patient Name: Date of Service: Kim [name] 10/25/2023andnbsp10:00 A M Medical Record Number: [id number] Patient Account Number: [id number] Date of Birth/Gender: Treating RN: 11/05/1940 (81 y.o. Kim [name], Kim [name] Primary Care Physician: Kim [name]: Referring Physician: Treating Physician/Extender: Karie Schwalbe in [name]: 3 Education Assessment Education Provided To: Patient Education Topics Provided Wound/Skin Impairment: Handouts: Skin Care Do's and Dont's Methods: Explain/Verbal Responses: Reinforcements needed Electronic Signature(s) Signed: 11/11/2021 6:57:47 PM By: Kim Pilling RN, [name] Entered By: Kim [name] on 11/11/2021 10:01:19 -------------------------------------------------------------------------------- Wound Assessment [name] Patient Name: Date of Service: Kim [name], Kim [name]. 11/11/2021 10:00 A M Medical Record Number:  [id number] Patient Account Number: [id number] Date of Birth/Sex: Treating RN: 11/11/1940 (81 y.o. Kim [name] Primary Care Leopold Smyers: Kim [name]: SAREN, Kim [name] ([id number]) [id number] Page 5 of 6 Referring Delmi Fulfer: Treating Antony Sian/Extender: Kim [name] Kim [name]: 3 Wound Status Wound Number: 1 Primary Etiology: Venous Leg Ulcer Wound Location: Right, Medial Malleolus Wound Status: Open Wounding Event: Gradually Appeared Comorbid History: Peripheral Venous Disease Date Acquired: 09/30/2021 Kim Of [name]: 3 Clustered Wound: Yes Photos Wound Measurements Length: (cm) 1 Width: (cm) 1.1 Depth: (cm) 0.2 Area: (cm) 0.864 Volume: (cm) 0.173 % Reduction in Area: 51.1% % Reduction in Volume: 51% Epithelialization: Small (1-33%) Tunneling: No Undermining: No Wound Description Classification: Full Thickness With Exposed Suppor Wound Margin: Distinct, outline attached Exudate Amount: Medium Exudate Type: Serosanguineous Exudate Color: red, brown [name] Structures Foul Odor After Cleansing: No Slough/Fibrino Yes Wound Bed Granulation Amount: Large (67-100%) Exposed Structure Granulation Quality: Red, Pink Fascia Exposed: No Necrotic Amount: Small (1-33%) Fat Layer (Subcutaneous Tissue) Exposed: Yes Necrotic Quality: Adherent Slough Tendon Exposed: No Muscle Exposed: No Joint Exposed: No Bone Exposed: No Periwound Skin Texture Texture Color No Abnormalities Noted: No No Abnormalities Noted: No Callus: No Atrophie Blanche: No Crepitus: No Cyanosis: No Excoriation: No Ecchymosis: No Induration: No Erythema: No Rash: No Hemosiderin Staining: No Scarring:  No Mottled: No Pallor: No Moisture Rubor: No No Abnormalities Noted: No Dry / Scaly: No Temperature / Pain Maceration: No Temperature: No Abnormality Tenderness on Palpation: Yes [name] Notes Wound #1 (Malleolus)  Wound Laterality: Right, Medial Cleanser Soap and Water Discharge Instruction: May shower and wash wound with dial antibacterial soap and water prior to dressing change. Wound Cleanser Discharge Instruction: Cleanse the wound with wound cleanser prior to applying a clean dressing using gauze sponges, not tissue or cotton [name]. Kim [name], Kim [name] ([id number]) [id number] Page 6 of 6 Peri-Wound Care Sween Lotion (Moisturizing lotion) Discharge Instruction: Apply moisturizing lotion as directed Topical Triamcinolone Discharge Instruction: Apply Triamcinolone directly to wound bed. Primary Dressing Hydrofera Blue Ready Foam, 4x5 in Discharge Instruction: Apply to wound bed as instructed Secondary Dressing Woven Gauze Sponge, Non-Sterile 4x4 in Discharge Instruction: Apply over primary dressing as directed. Secured With Compression Wrap ThreePress (3 layer compression wrap) Discharge Instruction: Apply three layer compression as directed. Compression Stockings Add-Ons Electronic Signature(s) Signed: 11/11/2021 5:59:56 PM By: Kim Creamer RN, [name] Entered By: Kim [name] on 11/11/2021 09:44:39 -------------------------------------------------------------------------------- Kim [name] Patient Name: Date of Service: Kim [name], Kim [name]. 11/11/2021 10:00 A M Medical Record Number: [id number] Patient Account Number: [id number] Date of Birth/Sex: Treating RN: 24-Apr-1940 (81 y.o. Kim [name] Primary Care Demarrius Guerrero: Kim [name]: Referring Avien Taha: Treating Naryiah Schley/Extender: Kim [name] Kim [name]: 3 Vital Signs Time Taken: 09:41 Temperature (F): 98.0 Pulse (bpm): 71 Respiratory Rate (breaths/min): 18 Blood Pressure (mmHg): 151/81 Reference Range: 80 - 120 mg / dl Electronic Signature(s) Signed: 11/11/2021 5:59:56 PM By: Kim Creamer RN, [name] Entered By: Kim [name] on 11/11/2021  09:42:42

## 2021-11-12 NOTE — Telephone Encounter (Signed)
Left message to return call to our office.  

## 2021-11-12 NOTE — Telephone Encounter (Signed)
Please triage this patient and likely needs eval.  Thanks.

## 2021-11-13 NOTE — Telephone Encounter (Signed)
Patient returned call again.she said she can be reached on her cell at  [phone].

## 2021-11-13 NOTE — Telephone Encounter (Signed)
I spoke with pt; pt said she is feeling better no S/T now and still has some prod cough with green phlegm but no CP,SOB or wheezing and no fever. Pt said to be honest she found a bottle of abx of hers at home to take; pt is at [employer] eating now and does not know name of abx but pt said she still has abx to take. Offered pt appt at [facility] in [place] today but pt said she is going to keep doing what she is doing and will cb first of next wk with update. UC & ED precautions given and pt voiced understanding and appreciative of call.

## 2021-11-13 NOTE — Telephone Encounter (Signed)
Patient returned call,would like a call back.

## 2021-11-15 NOTE — Telephone Encounter (Signed)
Noted. Thanks.

## 2021-11-16 ENCOUNTER — Encounter (INDEPENDENT_AMBULATORY_CARE_PROVIDER_SITE_OTHER): Payer: Self-pay

## 2021-11-18 ENCOUNTER — Encounter (HOSPITAL_BASED_OUTPATIENT_CLINIC_OR_DEPARTMENT_OTHER): Payer: Medicare Other | Attending: Physician Assistant | Admitting: Physician Assistant

## 2021-11-18 DIAGNOSIS — I87331 Chronic venous hypertension (idiopathic) with ulcer and inflammation of right lower extremity: Secondary | ICD-10-CM | POA: Insufficient documentation

## 2021-11-18 DIAGNOSIS — L97312 Non-pressure chronic ulcer of right ankle with fat layer exposed: Secondary | ICD-10-CM | POA: Insufficient documentation

## 2021-11-18 DIAGNOSIS — L950 Livedoid vasculitis: Secondary | ICD-10-CM | POA: Diagnosis not present

## 2021-11-18 DIAGNOSIS — I872 Venous insufficiency (chronic) (peripheral): Secondary | ICD-10-CM | POA: Diagnosis not present

## 2021-11-24 NOTE — Progress Notes (Signed)
BRYCELYN, GAMBINO ([id number]) [id number] Page 1 of 7 Visit Report for 11/18/2021 Chief Complaint Document Details Patient Name: Date of Service: [name], [name]. 11/18/2021 8:00 A M Medical Record Number: [id number] Patient Account Number: [id number] Date of Birth/Sex: Treating RN: 04-03-1940 (81 y.o. F) Primary Care Provider: [name] Other Clinician: Referring Provider: Treating Provider/Extender: [name] Weeks in Treatment: 4 Information Obtained from: Patient Chief Complaint Right ankle ulcer Electronic Signature(s) Signed: 11/18/2021 6:14:51 PM By: [name] PA-C Entered By: [name] on 11/18/2021 08:14:57 -------------------------------------------------------------------------------- Debridement Details Patient Name: Date of Service: [name], [name]. 11/18/2021 8:00 A M Medical Record Number: [id number] Patient Account Number: [id number] Date of Birth/Sex: Treating RN: 1940/06/09 (81 y.o. [name], [name] Primary Care Provider: [name] Other Clinician: Referring Provider: Treating Provider/Extender: [name] Weeks in Treatment: 4 Debridement Performed for Assessment: Wound #1 Right,Medial Malleolus Performed By: Physician [name], PA Debridement Type: Debridement Severity of Tissue Pre Debridement: Fat layer exposed Level of Consciousness (Pre-procedure): Awake and Alert Pre-procedure Verification/Time Out Yes - 08:42 Taken: Start Time: 08:42 Pain Control: Lidocaine [name] Area Debrided (L x W): otal 1 (cm) x 0.3 (cm) = 0.3 (cm) Tissue and other material debrided: Viable, Non-Viable, Slough, Subcutaneous, Slough Level: Skin/Subcutaneous Tissue Debridement Description: Excisional Instrument: Curette Bleeding: Minimum Hemostasis Achieved: Pressure End Time: 08:42 Procedural Pain: 0 Post Procedural Pain: 0 Response to Treatment:  Procedure was tolerated well Level of Consciousness (Post- Awake and Alert procedure): Post Debridement Measurements of Total Wound Length: (cm) 1 Width: (cm) 0.3 Depth: (cm) 0.1 Volume: (cm) 0.024 Character of Wound/Ulcer Post Debridement: Improved Severity of Tissue Post Debridement: Fat layer exposed [name], [name] ([id number]) [id number] Page 2 of 7 Post Procedure Diagnosis Same as Pre-procedure Electronic Signature(s) Signed: 11/18/2021 6:14:51 PM By: [name] PA-C Signed: 11/24/2021 3:49:22 PM By: Rhae Hammock RN Entered By: Rhae Hammock on 11/18/2021 08:44:25 -------------------------------------------------------------------------------- HPI Details Patient Name: Date of Service: [name], [name]. 11/18/2021 8:00 A M Medical Record Number: [id number] Patient Account Number: [id number] Date of Birth/Sex: Treating RN: [birth year] (81 y.o. F) Primary Care Provider: [name] Other Clinician: Referring Provider: Treating Provider/Extender: [name] Weeks in Treatment: 4 History of Present Illness HPI Description: 10-21-2021 upon evaluation today patient appears to be doing poorly in regard to the wound over the medial aspect of her right ankle. This is an area that previously she has had trouble with previously. I have actually seen her in [place] about a year ago but this was on the left ankle at that time. 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 This is also the typical spot for her as it is a very difficult area to compress. Nonetheless previously we have been able to get this under control using her compression socks as well as Xeroform gauze although I am not sure that is can be the best thing for her at this time we discussed that as we proceed. 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 Nonetheless I  do think that we could potentially try some oral steroids, prednisone, to see if we can help calm this down. She is not opposed to this at all. In fact she would like to do anything she can to try to get better she tells me the Achilles area has been very painful at times for her. I really do not see any signs of infection right now but that symptomatic = as well. 10/18; patient with wounds looks a lot better today measuring smaller. She went to her grandsons wedding this weekend did not wear compression wraps. She completed her course of prednisone 50 mg we have been using TCA and Hydrofera Blue on the wound area. 11-11-2021 upon evaluation today patient appears to be doing well currently in regard to her wound in fact she is showing signs of good granulation and epithelization at this point and I am very pleased with where we stand currently. I do not see any signs of infection locally or systemically which is great news. No fevers, chills, nausea, vomiting, or diarrhea. I do believe that the anti-inflammatories did do well for her. 11-18-2021 upon evaluation today patient's wound is actually showing signs of excellent improvement and very pleased with where we were seeing this improvement thus far. I do not see any evidence of active infection locally or systemically which is great news and overall I do believe we are on the right track here. Electronic Signature(s) Signed: 11/18/2021 9:55:06 AM By: [name] PA-C Entered By: [name] on 11/18/2021 09:55:05 -------------------------------------------------------------------------------- Physical Exam Details Patient Name: Date of Service: [name], [name]. 11/18/2021 8:00 A M Medical Record Number: [id number] Patient Account Number: [id number] Date of Birth/Sex: Treating RN: 02-16-1940 (81 y.o. F) Primary Care Provider: [name] Other Clinician: Referring Provider: Treating Provider/Extender: [name] Weeks in Treatment: 4 Constitutional Well-nourished and well-hydrated in no acute distress. LAURAJEAN, HOSEK ([id number]) [id number] Page 3 of 7 Respiratory normal breathing without difficulty. Psychiatric this patient is able to make decisions and demonstrates good insight into disease process. Alert and Oriented x 3. pleasant and cooperative. Notes Upon inspection patient's wound bed showed evidence of good granulation and epithelization. I do believe there was a minimal amount of necrotic tissue requiring debridement at the ankle region I was able to perform debridement today to clear this away and she tolerated that without complication. Electronic Signature(s) Signed: 11/18/2021 9:55:25 AM By: [name] PA-C Entered By: [name] on 11/18/2021 09:55:24 -------------------------------------------------------------------------------- Physician Orders Details Patient Name: Date of Service: [name], [name]. 11/18/2021 8:00 A M Medical Record Number: [id number] Patient Account Number: [id number] Date of Birth/Sex: Treating RN: 1940/09/15 (81 y.o. 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 -  w/ Jeri Cos, PA Wednesday Return A Anesthetic (In clinic) Topical Lidocaine 5% applied to wound bed Bathing/ Shower/ Hygiene May shower with protection but do not get wound dressing(s) wet. Edema Control - Lymphedema / SCD / Other Elevate legs to the level of the heart or above for 30 minutes daily and/or when sitting, a frequency of: - 3-4 times a  day throughout the day. Avoid standing for long periods of time. Exercise regularly Wound Treatment Wound #1 - Malleolus Wound Laterality: Right, Medial Cleanser: Soap and Water 1 x Per Week/15 Days Discharge Instructions: May shower and wash wound with dial antibacterial soap and water prior to dressing change. Cleanser: Wound Cleanser 1 x Per Week/15 Days Discharge Instructions: Cleanse the wound with wound cleanser prior to applying a clean dressing using gauze sponges, not tissue or cotton balls. Peri-Wound Care: Sween Lotion (Moisturizing lotion) 1 x Per Week/15 Days Discharge Instructions: Apply moisturizing lotion as directed Topical: Triamcinolone 1 x Per Week/15 Days Discharge Instructions: Apply Triamcinolone directly to wound bed. Prim Dressing: Hydrofera Blue Ready Foam, 4x5 in 1 x Per Week/15 Days ary Discharge Instructions: Apply to wound bed as instructed Secondary Dressing: Woven Gauze Sponge, Non-Sterile 4x4 in 1 x Per Week/15 Days Discharge Instructions: Apply over primary dressing as directed. Compression Wrap: ThreePress (3 layer compression wrap) 1 x Per Week/15 Days [name], [name] ([id number]) [id number] Page 4 of 7 Discharge Instructions: Apply three layer compression as directed. Electronic Signature(s) Signed: 11/18/2021 6:14:51 PM By: [name] PA-C Signed: 11/24/2021 3:49:22 PM By: Rhae Hammock RN Entered By: Rhae Hammock on 11/18/2021 08:44:04 -------------------------------------------------------------------------------- Problem List Details Patient Name: Date of  Service: [name], [name]. 11/18/2021 8:00 A M Medical Record Number: [id number] Patient Account Number: [id number] Date of Birth/Sex: Treating RN: 21-Jan-1940 (81 y.o. 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 11/18/2021 8:00 A M Medical Record Number: [id number] Patient Account Number: [id number] Date of Birth/Sex: Treating RN: Mar 22, 1940 (81 y.o. F) Primary Care Provider: [name] Other Clinician: Referring Provider: Treating Provider/Extender: [name] Weeks in Treatment: 4 Subjective Chief Complaint [name], [name] [name] ([id number]) [id number] Page 5 of 7 Information obtained from Patient Right ankle ulcer History of Present Illness (HPI) 10-21-2021 upon evaluation today patient appears to be doing poorly in regard to the wound over the medial aspect of her right ankle. This is an area that previously she has had trouble with previously. I have actually seen her in [place] about a year ago but this was on the left ankle at that time. 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 This is also  the typical spot for her as it is a very difficult area to compress. Nonetheless previously we have been able to get this under control using her compression socks as well as Xeroform gauze although I am not sure that is can be the best thing for her at this time we discussed that as we proceed. 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 Nonetheless I do think that we could potentially try some oral steroids, prednisone, to see if we can help calm this down. She is not opposed to this at all. In fact she would like to do anything she can to try to get better she tells me the Achilles area has been very painful at times for her. I really do not see any signs of infection right now but that symptomatic = as well. 10/18; patient with wounds looks a lot better today measuring smaller. She went to her grandsons wedding this weekend did not wear compression wraps. She completed her course of prednisone 50 mg we have been using TCA and Hydrofera Blue on the wound area. 11-11-2021 upon evaluation today patient appears to be doing well currently in regard to her wound in fact she is showing signs of good granulation and epithelization at this point and I am very pleased with where we stand currently. I do not see any signs of infection locally or systemically which is great news. No fevers, chills, nausea, vomiting, or diarrhea. I do believe that the anti-inflammatories did do well for her. 11-18-2021 upon evaluation today patient's wound is actually showing signs of excellent improvement and very pleased with where we were seeing this improvement thus far. I do not see any evidence of active infection locally  or systemically which is great news and overall I do believe we are on the right track here. Objective Constitutional Well-nourished and well-hydrated in no acute distress. Vitals Time Taken: 7:55 AM, Temperature: 97.7 F, Pulse: 85 bpm, Respiratory Rate: 20 breaths/min, Blood Pressure: 142/84 mmHg. Respiratory normal breathing without difficulty. Psychiatric this patient is able to make decisions and demonstrates good insight into disease process. Alert and Oriented x 3. pleasant and cooperative. General Notes: Upon inspection patient's wound bed showed evidence of good granulation and epithelization. I do believe there was a minimal amount of necrotic tissue requiring debridement at the ankle region I was able to perform debridement today to clear this away and she tolerated that without complication. Integumentary (Hair, Skin) Wound #1 status is Open. Original cause of wound was Gradually Appeared. The date acquired was: 09/30/2021. The wound has been in treatment 4 weeks. The wound is located on the Right,Medial Malleolus. The wound measures 1cm length x 0.3cm width x 0.1cm depth; 0.236cm^2 area and 0.024cm^3 volume. There is Fat Layer (Subcutaneous Tissue) exposed. There is no tunneling or undermining noted. There is a medium amount of serosanguineous drainage noted. The wound margin is distinct with the outline attached to the wound base. There is large (67-100%) red, pink granulation within the wound bed. There is a small (1- 33%) amount of necrotic tissue within the wound bed including Eschar and Adherent Slough. The periwound skin appearance did not exhibit: Callus, Crepitus, Excoriation, Induration, Rash, Scarring, Dry/Scaly, Maceration, Atrophie Blanche, Cyanosis, Ecchymosis, Hemosiderin Staining, Mottled, Pallor, Rubor, Erythema. Periwound temperature was noted as No Abnormality. The periwound has tenderness on palpation. 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 There was a Excisional Skin/Subcutaneous Tissue Debridement with a total area of 0.3 sq cm performed by [name], PA. With  the following instrument(s): Curette to remove Viable and Non-Viable tissue/material. Material removed includes Subcutaneous Tissue and Slough and after achieving pain control using Lidocaine. No specimens were taken. A time out was conducted at 08:42, prior to the start of the procedure. A Minimum amount of bleeding was ROSANGELA, FEHRENBACH [name] ([id number]) [id number] Page 6 of 7 controlled with Pressure. The procedure was tolerated well with a pain level of 0 throughout and a pain level of 0 following the procedure. Post Debridement Measurements: 1cm length x 0.3cm width x 0.1cm depth; 0.024cm^3 volume. Character of Wound/Ulcer Post Debridement is improved. Severity of Tissue Post Debridement is: Fat layer exposed. Post procedure Diagnosis Wound #1: Same as Pre-Procedure Pre-procedure diagnosis of Wound #1 is a Venous Leg Ulcer located on the Right,Medial Malleolus . There was a Three Layer Compression Therapy Procedure by Rhae Hammock, RN. Post procedure Diagnosis Wound #1: Same as Pre-Procedure Plan Follow-up Appointments: Return Appointment in 1 week. - w/ Jeri Cos, PA Wednesday Anesthetic: (In clinic) Topical Lidocaine 5% applied to wound bed Bathing/ Shower/ Hygiene: May shower with protection but do not get wound dressing(s) wet. Edema Control - Lymphedema / SCD / Other: Elevate legs to the level of the heart or above for 30 minutes daily and/or when sitting, a frequency of: - 3-4 times a  day throughout the day. Avoid standing for long periods of time. Exercise regularly WOUND #1: - Malleolus Wound Laterality: Right, Medial Cleanser: Soap and Water 1 x Per Week/15 Days Discharge Instructions: May shower and wash wound with dial antibacterial soap and water prior to dressing change. Cleanser: Wound Cleanser 1 x Per Week/15 Days Discharge Instructions: Cleanse the wound with wound cleanser prior to applying a clean dressing using gauze sponges, not tissue or cotton balls. Peri-Wound Care: Sween Lotion (Moisturizing lotion) 1 x Per Week/15 Days Discharge Instructions: Apply moisturizing lotion as directed Topical: Triamcinolone 1 x Per Week/15 Days Discharge Instructions: Apply Triamcinolone directly to wound bed. Prim Dressing: Hydrofera Blue Ready Foam, 4x5 in 1 x Per Week/15 Days ary Discharge Instructions: Apply to wound bed as instructed Secondary Dressing: Woven Gauze Sponge, Non-Sterile 4x4 in 1 x Per Week/15 Days Discharge Instructions: Apply over primary dressing as directed. Com pression Wrap: ThreePress (3 layer compression wrap) 1 x Per Week/15 Days Discharge Instructions: Apply three layer compression as directed. 1. I am going to suggest that we have her continue to monitor for any evidence of infection or worsening. Obviously as it stands right now I feel like we are on a pretty good track here and I am hopeful this will continue to do well for her. 2. I am also going to suggest that we continue with the triamcinolone specifically followed by the [facility]. 3. Also do think that the compression wrapping which is a 3 layer compression wrap is helpful. She does note that it really aggravates her badly. Nonetheless I do believe that this is probably her best bet of getting this healed due to the location of the wound and the amount of compression necessary. She voiced understanding and does want to go ahead and proceed with a wrap today. We will see patient back  for reevaluation in 1 week here in the clinic. If anything worsens or changes patient will contact our office for additional recommendations. Electronic Signature(s) Signed: 11/18/2021 9:56:55 AM By: [name] PA-C Entered By: [name] on 11/18/2021 09:56:55 -------------------------------------------------------------------------------- SuperBill Details Patient Name: Date of Service: [name], Yelena [name]. 11/18/2021 Medical Record Number:  [id number] Patient Account Number: [id number] Date of Birth/Sex: Treating RN: April 20, 1940 (81 y.o. 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 YOLANDER, GOODIE [name] ([id number]) [id number] Page 7 of 7 I87.331 Chronic venous hypertension (idiopathic) with ulcer and inflammation of right lower extremity L97.312 Non-pressure chronic ulcer of right ankle with fat layer exposed Facility Procedures : CPT4 Code: [id number] Description: 28366 - DEB SUBQ TISSUE 20 SQ CM/< ICD-10 Diagnosis Description Q94.765 Non-pressure chronic ulcer of right ankle with fat layer exposed Modifier: Quantity: 1 Physician Procedures : CPT4 Code Description Modifier [id number] 11042 - WC PHYS SUBQ TISS 20 SQ CM ICD-10 Diagnosis Description S56.812 Non-pressure chronic ulcer of right ankle with fat layer exposed Quantity: 1 Electronic Signature(s) Signed: 11/18/2021 9:57:04 AM By: [name] PA-C Entered By: [name] on 11/18/2021 09:57:04

## 2021-11-24 NOTE — Progress Notes (Signed)
Kim, [name] ([id number]) [id number] Page 1 of 6 Visit Report for 11/18/2021 Arrival Information Details Patient Name: Date of Service: [name], [name]. 11/18/2021 8:00 A M Medical Record Number: [id number] Patient Account Number: [id number] Date of Birth/Sex: Treating RN: 1940-05-09 (81 y.o. F) Primary Care Angell Honse: [name] Other Clinician: Referring Giovanna Kemmerer: Treating Nellie Chevalier/Extender: [name] Weeks in Treatment: 4 Visit Information History Since Last Visit All ordered tests and consults were completed: No Patient Arrived: Ambulatory Added or deleted any medications: No Arrival Time: 08:00 Any new allergies or adverse reactions: No Transfer Assistance: None Had a fall or experienced change in No Patient Identification Verified: Yes activities of daily living that may affect Secondary Verification Process Completed: Yes risk of falls: Patient Requires Transmission-Based Precautions: No Signs or symptoms of abuse/neglect since last visito No Patient Has Alerts: No Hospitalized since last visit: No Implantable device outside of the clinic excluding No cellular tissue based products placed in the center since last visit: Pain Present Now: Yes Electronic Signature(s) Signed: 11/18/2021 8:44:27 AM By: Worthy [name] Entered By: Worthy [name] on 11/18/2021 08:00:56 -------------------------------------------------------------------------------- Compression Therapy Details Patient Name: Date of Service: [name], [name]. 11/18/2021 8:00 A M Medical Record Number: [id number] Patient Account Number: [id number] Date of Birth/Sex: Treating RN: 20-Nov-1940 (81 y.o. [name], [name] Primary Care Mandie Crabbe: [name] Other Clinician: Referring Cora Brierley: Treating Darrie Macmillan/Extender: [name] Weeks in Treatment: 4 Compression Therapy Performed for Wound Assessment: Wound #1  Right,Medial Malleolus Performed By: Clinician Rhae Hammock, RN Compression Type: Three Layer Post Procedure Diagnosis Same as Pre-procedure Electronic Signature(s) Signed: 11/24/2021 3:49:22 PM By: Rhae Hammock RN Entered By: Rhae Hammock on 11/18/2021 08:43:13 [name], Raeanne [name] ([id number]) [id number] Page 2 of 6 -------------------------------------------------------------------------------- Encounter Discharge Information Details Patient Name: Date of Service: [name], ORIORDAN. 11/18/2021 8:00 A M Medical Record Number: [id number] Patient Account Number: [id number] Date of Birth/Sex: Treating RN: 09/14/1940 (81 y.o. [name], [name] Primary Care Nyelah Emmerich: [name] Other Clinician: Referring Ellwyn Ergle: Treating Keslie Gritz/Extender: [name] Weeks in Treatment: 4 Encounter Discharge Information Items Post Procedure Vitals Discharge Condition: Stable Temperature (F): 98.7 Ambulatory Status: Ambulatory Pulse (bpm): 74 Discharge Destination: Home Respiratory Rate (breaths/min): 17 Transportation: Private Auto Blood Pressure (mmHg): 134/74 Accompanied By: husband Schedule Follow-up Appointment: Yes Clinical Summary of Care: Patient Declined Electronic Signature(s) Signed: 11/24/2021 3:49:22 PM By: Rhae Hammock RN Entered By: Rhae Hammock on 11/18/2021 08:45:30 -------------------------------------------------------------------------------- Lower Extremity Assessment Details Patient Name: Date of Service: [name], [name]. 11/18/2021 8:00 A M Medical Record Number: [id number] Patient Account Number: [id number] Date of Birth/Sex: Treating RN: 12-27-1940 (81 y.o. [name], [name] Primary Care Marsha Hillman: [name] Other Clinician: Referring Marvens Hollars: Treating Artez Regis/Extender: [name] Weeks in Treatment: 4 Edema Assessment Assessed: [Left: No]  [Right: Yes] Edema: [Left: Ye] [Right: s] Calf Left: Right: Point of Measurement: 37 cm From Medial Instep 41 cm Ankle Left: Right: Point of Measurement: 8 cm From Medial Instep 23 cm Vascular Assessment Pulses: Dorsalis Pedis Palpable: [Right:Yes] Electronic Signature(s) Signed: 11/20/2021 5:25:29 PM By: Deon Pilling RN, BSN Entered By: Deon Pilling on 11/18/2021 [place] Details -------------------------------------------------------------------------------- [name] ([id number]) [id number] Page 3 of 6 Patient Name: Date of Service: [name], [name]. 11/18/2021 8:00 A M Medical Record Number: [id number] Patient Account Number: [id number] Date of Birth/Sex: Treating RN: 1940-05-20 (81 y.o. [name], [name]  Primary Care Rayetta Veith: [name] Other Clinician: Referring Dniya Neuhaus: Treating Claris Guymon/Extender: [name] Weeks in Treatment: 4 Active Inactive Wound/Skin Impairment Nursing Diagnoses: Impaired tissue integrity Knowledge deficit related to smoking impact on wound healing Goals: Patient will have a decrease in wound volume by X% from date: (specify in notes) Date Initiated: 10/21/2021 Target Resolution Date: 12/18/2021 Goal Status: Active Patient/caregiver will verbalize understanding of skin care regimen Date Initiated: 10/21/2021 Date Inactivated: 11/11/2021 Target Resolution Date: 11/21/2021 Goal Status: Met Ulcer/skin breakdown will have a volume reduction of 30% by week 4 Date Initiated: 10/21/2021 Target Resolution Date: 11/20/2021 Goal Status: Active Interventions: Assess patient/caregiver ability to obtain necessary supplies Assess patient/caregiver ability to perform ulcer/skin care regimen upon admission and as needed Assess ulceration(s) every visit Notes: Electronic Signature(s) Signed: 11/24/2021 3:49:22 PM By: Rhae Hammock RN Entered By:  Rhae Hammock on 11/18/2021 08:41:36 -------------------------------------------------------------------------------- Pain Assessment Details Patient Name: Date of Service: [name], [name]. 11/18/2021 8:00 [place] Record Number: [id number] Patient Account Number: [id number] Date of Birth/Sex: Treating RN: 1940/03/06 (81 y.o. F) Primary Care Forney Kleinpeter: [name] Other Clinician: Referring Arlette Schaad: Treating Yamilee Harmes/Extender: [name] Weeks in Treatment: 4 Active Problems Location of Pain Severity and Description of Pain Patient Has Paino Yes Site Locations Rate the pain. COLINE, CALKIN ([id number]) [id number] Page 4 of 6 Rate the pain. Current Pain Level: 6 Worst Pain Level: 10 Least Pain Level: 0 Tolerable Pain Level: 4 Pain Management and Medication Current Pain Management: Electronic Signature(s) Signed: 11/18/2021 8:44:27 AM By: Worthy [name] Entered By: Worthy [name] on 11/18/2021 08:12:49 -------------------------------------------------------------------------------- Patient/Caregiver Education Details Patient Name: Date of Service: Kim Lance [name]. 11/1/2023andnbsp8:00 [place] Record Number: [id number] Patient Account Number: [id number] Date of Birth/Gender: Treating RN: 03/18/1940 (81 y.o. [name], [name] Primary Care Physician: [name] Other Clinician: Referring Physician: Treating Physician/Extender: Karie Schwalbe in Treatment: 4 Education Assessment Education Provided To: Patient Education Topics Provided Wound/Skin Impairment: Methods: Explain/Verbal Responses: State content correctly Electronic Signature(s) Signed: 11/24/2021 3:49:22 PM By: Rhae Hammock RN Entered By: Rhae Hammock on 11/18/2021 08:41:47 -------------------------------------------------------------------------------- Wound Assessment Details Patient Name:  Date of Service: [name], [name]. 11/18/2021 8:00 A M Medical Record Number: [id number] Patient Account Number: [id number] Date of Birth/Sex: Treating RN: 01/26/1940 (81 y.o. F) Primary Care Stacie Knutzen: [name] Other Clinician: Referring Sharaine Delange: Treating Ekaterini Capitano/Extender: Samantha Crimes, Ieshia [name] ([id number]) [id number] Page 5 of 6 Weeks in Treatment: 4 Wound Status Wound Number: 1 Primary Etiology: Venous Leg Ulcer Wound Location: Right, Medial Malleolus Wound Status: Open Wounding Event: Gradually Appeared Comorbid History: Peripheral Venous Disease Date Acquired: 09/30/2021 Weeks Of Treatment: 4 Clustered Wound: Yes Photos Wound Measurements Length: (cm) 1 Width: (cm) 0.3 Depth: (cm) 0.1 Area: (cm) 0.236 Volume: (cm) 0.024 % Reduction in Area: 86.6% % Reduction in Volume: 93.2% Epithelialization: Large (67-100%) Tunneling: No Undermining: No Wound Description Classification: Full Thickness With Exposed Suppo Wound Margin: Distinct, outline attached Exudate Amount: Medium Exudate Type: Serosanguineous Exudate Color: red, brown rt Structures Foul Odor After Cleansing: No Slough/Fibrino Yes Wound Bed Granulation Amount: Large (67-100%) Exposed Structure Granulation Quality: Red, Pink Fascia Exposed: No Necrotic Amount: Small (1-33%) Fat Layer (Subcutaneous Tissue) Exposed: Yes Necrotic Quality: Eschar, Adherent Slough Tendon Exposed: No Muscle Exposed: No Joint Exposed: No Bone Exposed: No Periwound Skin Texture Texture Color No Abnormalities Noted: No No Abnormalities Noted: No Callus: No  Atrophie Blanche: No Crepitus: No Cyanosis: No Excoriation: No Ecchymosis: No Induration: No Erythema: No Rash: No Hemosiderin Staining: No Scarring: No Mottled: No Pallor: No Moisture Rubor: No No Abnormalities Noted: No Dry / Scaly: No Temperature / Pain Maceration: No Temperature: No  Abnormality Tenderness on Palpation: Yes Treatment Notes Wound #1 (Malleolus) Wound Laterality: Right, Medial Cleanser Soap and Water Discharge Instruction: May shower and wash wound with dial antibacterial soap and water prior to dressing change. Wound Cleanser Discharge Instruction: Cleanse the wound with wound cleanser prior to applying a clean dressing using gauze sponges, not tissue or cotton balls. CLOIS, MONTAVON ([id number]) [id number] Page 6 of 6 Peri-Wound Care Sween Lotion (Moisturizing lotion) Discharge Instruction: Apply moisturizing lotion as directed Topical Triamcinolone Discharge Instruction: Apply Triamcinolone directly to wound bed. Primary Dressing Hydrofera Blue Ready Foam, 4x5 in Discharge Instruction: Apply to wound bed as instructed Secondary Dressing Woven Gauze Sponge, Non-Sterile 4x4 in Discharge Instruction: Apply over primary dressing as directed. Secured With Compression Wrap ThreePress (3 layer compression wrap) Discharge Instruction: Apply three layer compression as directed. Compression Stockings Add-Ons Electronic Signature(s) Signed: 11/18/2021 8:44:27 AM By: Worthy [name] Entered By: Worthy [name] on 11/18/2021 08:18:11 -------------------------------------------------------------------------------- Vitals Details Patient Name: Date of Service: [name], [name]. 11/18/2021 8:00 A M Medical Record Number: [id number] Patient Account Number: [id number] Date of Birth/Sex: Treating RN: 07/22/1940 (81 y.o. F) Primary Care Hanley Woerner: [name] Other Clinician: Referring Bernese Doffing: Treating Saydee Zolman/Extender: [name] Weeks in Treatment: 4 Vital Signs Time Taken: 07:55 Temperature (F): 97.7 Pulse (bpm): 85 Respiratory Rate (breaths/min): 20 Blood Pressure (mmHg): 142/84 Reference Range: 80 - 120 mg / dl Electronic Signature(s) Signed: 11/18/2021 8:44:27 AM By: Worthy [name] Entered By: Worthy [name] on 11/18/2021 08:03:20

## 2021-11-25 ENCOUNTER — Encounter (HOSPITAL_BASED_OUTPATIENT_CLINIC_OR_DEPARTMENT_OTHER): Payer: Medicare Other | Admitting: Internal Medicine

## 2021-11-25 DIAGNOSIS — L950 Livedoid vasculitis: Secondary | ICD-10-CM | POA: Diagnosis not present

## 2021-11-25 DIAGNOSIS — I87331 Chronic venous hypertension (idiopathic) with ulcer and inflammation of right lower extremity: Secondary | ICD-10-CM | POA: Diagnosis not present

## 2021-11-25 DIAGNOSIS — I872 Venous insufficiency (chronic) (peripheral): Secondary | ICD-10-CM | POA: Diagnosis not present

## 2021-11-25 DIAGNOSIS — L97312 Non-pressure chronic ulcer of right ankle with fat layer exposed: Secondary | ICD-10-CM | POA: Diagnosis not present

## 2021-11-25 NOTE — Progress Notes (Signed)
[name], [name] ([id number]) [id number] Page 1 of 8 Visit Report for 11/25/2021 Arrival Information Details Patient Name: Date of Service: Kim, [name] 11/25/2021 8:00 A M Medical Record Number: [id number] Patient Account Number: [id number] Date of Birth/Sex: Treating RN: January 17, 1941 (81 y.o. [name] Primary Care [name]: [name] Other Clinician: Referring Wenda Vanschaick: Treating [name]/Extender: [name] Weeks in Treatment: 5 Visit Information History Since Last Visit Added or deleted any medications: No Patient Arrived: Ambulatory Any new allergies or adverse reactions: No Arrival Time: 08:06 Had a fall or experienced change in No Accompanied By: husband activities of daily living that may affect Transfer Assistance: None risk of falls: Patient Identification Verified: Yes Signs or symptoms of abuse/neglect since last visito No Secondary Verification Process Completed: Yes Hospitalized since last visit: No Patient Requires Transmission-Based Precautions: No Implantable device outside of the clinic excluding No Patient Has Alerts: No cellular tissue based products placed in the center since last visit: Has Dressing in Place as Prescribed: Yes Has Compression in Place as Prescribed: Yes Pain Present Now: Yes Electronic Signature(s) Signed: 11/25/2021 3:10:50 PM By: Kim Creamer RN, BSN Entered By: [name] on 11/25/2021 08:07:08 -------------------------------------------------------------------------------- Encounter Discharge Information Details Patient Name: Date of Service: [name], [name]. 11/25/2021 8:00 A M Medical Record Number: [id number] Patient Account Number: [id number] Date of Birth/Sex: Treating RN: [birth year] (81 y.o. [name], [name] Primary Care Ahonesty Woodfin: [name] Other Clinician: Referring Phelan Schadt: Treating [name]/Extender: [name] Weeks in Treatment: 5 Encounter Discharge Information Items Post Procedure Vitals Discharge Condition: Stable Temperature (F): 98.7 Ambulatory Status: Ambulatory Pulse (bpm): 74 Discharge Destination: Home Respiratory Rate (breaths/min): 17 Transportation: Private Auto Blood Pressure (mmHg): 134/74 Accompanied By: husband Schedule Follow-up Appointment: Yes Clinical Summary of Care: Patient Declined Electronic Signature(s) Signed: 11/25/2021 3:55:23 PM By: Kim Hammock RN Entered By: [name] on 11/25/2021 08:31:29 Lineman, [name] ([id number]) [id number] Page 2 of 8 -------------------------------------------------------------------------------- Lower Extremity Assessment Details Patient Name: Date of Service: Kim, [name] [name]. 11/25/2021 8:00 A M Medical Record Number: [id number] Patient Account Number: [id number] Date of Birth/Sex: Treating RN: 1941/01/15 (81 y.o. [name] Primary Care Bari Handshoe: [name] Other Clinician: Referring Sherree Shankman: Treating Shellie Rogoff/Extender: [name] Weeks in Treatment: 5 Edema Assessment Assessed: [name]: No] Patrice Paradise: No] Edema: [Left: Ye] [Right: s] Calf Left: Right: Point of Measurement: 37 cm From Medial Instep 39.5 cm Ankle Left: Right: Point of Measurement: 8 cm From Medial Instep 21.5 cm Vascular Assessment Pulses: Dorsalis Pedis Palpable: [Right:Yes] Electronic Signature(s) Signed: 11/25/2021 3:10:50 PM By: Kim Creamer RN, BSN Entered By: [name] on 11/25/2021 08:15:09 -------------------------------------------------------------------------------- Multi Wound Chart Details Patient Name: Date of Service: [name], [name]. 11/25/2021 8:00 A M Medical Record Number: [id number] Patient Account Number: [id number] Date of Birth/Sex: Treating RN: October 01, 1940 (81 y.o. F) Primary Care Tykesha Konicki: [name] Other  Clinician: Referring Aliscia Tomlinson: Treating Eliasar Hlavaty/Extender: [name] Weeks in Treatment: 5 Vital Signs Height(in): Pulse(bpm): 55 Weight(lbs): Blood Pressure(mmHg): 164/84 Body Mass Index(BMI): Temperature(F): 98.2 Respiratory Rate(breaths/min): 18 [1:Photos:] [N/A:N/A] Right, Medial Malleolus N/A N/A Wound Location: Gradually Appeared N/A N/A Wounding Event: Venous Leg Ulcer N/A N/A Primary Etiology: Peripheral Venous Disease N/A N/A Comorbid History: 09/30/2021 N/A N/A Date Acquired: 5 N/A N/A Weeks of Treatment: Open N/A N/A Wound Status: No N/A N/A Wound Recurrence: Yes N/A N/A Clustered Wound: 0.5x0.3x0.1 N/A N/A Measurements L x W x D (cm)  0.118 N/A N/A A (cm) : rea 0.012 N/A N/A Volume (cm) : 93.30% N/A N/A % Reduction in A rea: 96.60% N/A N/A % Reduction in Volume: Full Thickness With Exposed Support N/A N/A Classification: Structures Medium N/A N/A Exudate A mount: Serosanguineous N/A N/A Exudate Type: red, brown N/A N/A Exudate Color: Distinct, outline attached N/A N/A Wound Margin: Small (1-33%) N/A N/A Granulation A mount: Red, Pink N/A N/A Granulation Quality: Large (67-100%) N/A N/A Necrotic A mount: Eschar, Adherent Slough N/A N/A Necrotic Tissue: Fat Layer (Subcutaneous Tissue): Yes N/A N/A Exposed Structures: Fascia: No Tendon: No Muscle: No Joint: No Bone: No Large (67-100%) N/A N/A Epithelialization: Debridement - Selective/Open Wound N/A N/A Debridement: Pre-procedure Verification/Time Out 08:26 N/A N/A Taken: Lidocaine N/A N/A Pain Control: Slough N/A N/A Tissue Debrided: Non-Viable Tissue N/A N/A Level: 0.15 N/A N/A Debridement A (sq cm): rea Curette N/A N/A Instrument: Minimum N/A N/A Bleeding: Pressure N/A N/A Hemostasis A chieved: 0 N/A N/A Procedural Pain: 0 N/A N/A Post Procedural Pain: Procedure was tolerated well N/A N/A Debridement Treatment Response: 0.5x0.3x0.1  N/A N/A Post Debridement Measurements L x W x D (cm) 0.012 N/A N/A Post Debridement Volume: (cm) Excoriation: No N/A N/A Periwound Skin Texture: Induration: No Callus: No Crepitus: No Rash: No Scarring: No Maceration: No N/A N/A Periwound Skin Moisture: Dry/Scaly: No Atrophie Blanche: No N/A N/A Periwound Skin Color: Cyanosis: No Ecchymosis: No Erythema: No Hemosiderin Staining: No Mottled: No Pallor: No Rubor: No No Abnormality N/A N/A Temperature: Yes N/A N/A Tenderness on Palpation: Debridement N/A N/A Procedures Performed: Treatment Notes Wound #1 (Malleolus) Wound Laterality: Right, Medial Cleanser Soap and Water Discharge Instruction: May shower and wash wound with dial antibacterial soap and water prior to dressing change. Wound Cleanser Discharge Instruction: Cleanse the wound with wound cleanser prior to applying a clean dressing using gauze sponges, not tissue or cotton balls. Peri-Wound Care Topical Triamcinolone Discharge Instruction: Apply Triamcinolone directly to wound bed. LOLITHA, TORTORA ([id number]) [id number] Page 4 of 8 Primary Dressing Hydrofera Blue Ready Foam, 4x5 in Discharge Instruction: Apply to wound bed as instructed Secondary Dressing Woven Gauze Sponge, Non-Sterile 4x4 in Discharge Instruction: Apply over primary dressing as directed. Zetuvit Plus Silicone Border Dressing 4x4 (in/in) Discharge Instruction: Apply silicone border over primary dressing as directed. Secured With Compression Wrap Compression Stockings Environmental education officer) Signed: 11/25/2021 3:30:35 PM By: Linton Ham MD Entered By: Linton Ham on 11/25/2021 08:34:44 -------------------------------------------------------------------------------- Multi-Disciplinary Care Plan Details Patient Name: Date of Service: [name], [name]. 11/25/2021 8:00 A M Medical Record Number: [id number] Patient Account Number:  [id number] Date of Birth/Sex: Treating RN: [birth year] (81 y.o. [name], [name] Primary Care Tristina Sahagian: [name] Other Clinician: Referring Kadasia Kassing: Treating Donovan Gatchel/Extender: [name] Weeks in Treatment: 5 Active Inactive Wound/Skin Impairment Nursing Diagnoses: Impaired tissue integrity Knowledge deficit related to smoking impact on wound healing Goals: Patient will have a decrease in wound volume by X% from date: (specify in notes) Date Initiated: 10/21/2021 Target Resolution Date: 12/18/2021 Goal Status: Active Patient/caregiver will verbalize understanding of skin care regimen Date Initiated: 10/21/2021 Date Inactivated: 11/11/2021 Target Resolution Date: 11/21/2021 Goal Status: Met Ulcer/skin breakdown will have a volume reduction of 30% by week 4 Date Initiated: 10/21/2021 Target Resolution Date: 12/19/2021 Goal Status: Active Interventions: Assess patient/caregiver ability to obtain necessary supplies Assess patient/caregiver ability to perform ulcer/skin care regimen upon admission and as needed Assess ulceration(s) every visit Notes: Electronic Signature(s) Signed: 11/25/2021 3:55:23 PM By: Kim Hammock RN Entered  By: [name] on 11/25/2021 08:30:18 Petties, Abbigal [name] ([id number]) [id number] Page 5 of 8 -------------------------------------------------------------------------------- Pain Assessment Details Patient Name: Date of Service: [name], [name]. 11/25/2021 8:00 A M Medical Record Number: [id number] Patient Account Number: [id number] Date of Birth/Sex: Treating RN: 16-Dec-1940 (81 y.o. [name] Primary Care Azavion Bouillon: [name] Other Clinician: Referring Telsa Dillavou: Treating Va Broadwell/Extender: [name] Weeks in Treatment: 5 Active Problems Location of Pain Severity and Description of Pain Patient Has Paino Yes Site Locations Duration  of the Pain. Constant / Intermittento Intermittent Rate the pain. Current Pain Level: 2 Worst Pain Level: 9 Character of Pain Describe the Pain: Burning, Other: stinging Pain Management and Medication Current Pain Management: Electronic Signature(s) Signed: 11/25/2021 3:10:50 PM By: Kim Creamer RN, BSN Entered By: [name] on 11/25/2021 08:08:10 -------------------------------------------------------------------------------- Patient/Caregiver Education Details Patient Name: Date of Service: Kim Lance [name]. 11/8/2023andnbsp8:00 A M Medical Record Number: [id number] Patient Account Number: [id number] Date of Birth/Gender: Treating RN: Aug 23, 1940 (81 y.o. [name], [name] Primary Care Physician: [name] Other Clinician: Referring Physician: Treating Physician/Extender: Veneta Penton in Treatment: 5 Education Assessment Education Provided To: Patient Education Topics Provided Wound/Skin ImpairmentKHLOIE, [name] ([id number]) [id number] Page 6 of 8 Methods: Explain/Verbal Responses: State content correctly Electronic Signature(s) Signed: 11/25/2021 3:55:23 PM By: Kim Hammock RN Entered By: [name] on 11/25/2021 08:30:37 -------------------------------------------------------------------------------- Wound Assessment Details Patient Name: Date of Service: [name], [name]. 11/25/2021 8:00 A M Medical Record Number: [id number] Patient Account Number: [id number] Date of Birth/Sex: Treating RN: 10-Apr-1940 (81 y.o. [name] Primary Care Shanina Kepple: [name] Other Clinician: Referring Lakecia Deschamps: Treating Kimbria Camposano/Extender: [name] Weeks in Treatment: 5 Wound Status Wound Number: 1 Primary Etiology: Venous Leg Ulcer Wound Location: Right, Medial Malleolus Wound Status: Open Wounding Event: Gradually Appeared Comorbid History:  Peripheral Venous Disease Date Acquired: 09/30/2021 Weeks Of Treatment: 5 Clustered Wound: Yes Photos Wound Measurements Length: (cm) 0.5 Width: (cm) 0.3 Depth: (cm) 0.1 Area: (cm) 0.118 Volume: (cm) 0.012 % Reduction in Area: 93.3% % Reduction in Volume: 96.6% Epithelialization: Large (67-100%) Tunneling: No Undermining: No Wound Description Classification: Full Thickness With Exposed Suppo Wound Margin: Distinct, outline attached Exudate Amount: Medium Exudate Type: Serosanguineous Exudate Color: red, brown rt Structures Foul Odor After Cleansing: No Slough/Fibrino Yes Wound Bed Granulation Amount: Small (1-33%) Exposed Structure Granulation Quality: Red, Pink Fascia Exposed: No Necrotic Amount: Large (67-100%) Fat Layer (Subcutaneous Tissue) Exposed: Yes Necrotic Quality: Eschar, Adherent Slough Tendon Exposed: No Muscle Exposed: No Joint Exposed: No Bone Exposed: No Periwound Skin Texture Texture Color No Abnormalities Noted: No No Abnormalities Noted: No Houseman, [name] ([id number]) [id number] Page 7 of 8 Callus: No Atrophie Blanche: No Crepitus: No Cyanosis: No Excoriation: No Ecchymosis: No Induration: No Erythema: No Rash: No Hemosiderin Staining: No Scarring: No Mottled: No Pallor: No Moisture Rubor: No No Abnormalities Noted: No Dry / Scaly: No Temperature / Pain Maceration: No Temperature: No Abnormality Tenderness on Palpation: Yes Treatment Notes Wound #1 (Malleolus) Wound Laterality: Right, Medial Cleanser Soap and Water Discharge Instruction: May shower and wash wound with dial antibacterial soap and water prior to dressing change. Wound Cleanser Discharge Instruction: Cleanse the wound with wound cleanser prior to applying a clean dressing using gauze sponges, not tissue or cotton balls. Peri-Wound Care Topical Triamcinolone Discharge Instruction: Apply Triamcinolone directly to wound bed. Primary  Dressing Hydrofera Blue Ready Foam, 4x5 in Discharge Instruction: Apply to  wound bed as instructed Secondary Dressing Woven Gauze Sponge, Non-Sterile 4x4 in Discharge Instruction: Apply over primary dressing as directed. Zetuvit Plus Silicone Border Dressing 4x4 (in/in) Discharge Instruction: Apply silicone border over primary dressing as directed. Secured With Compression Wrap Compression Stockings Environmental education officer) Signed: 11/25/2021 3:10:50 PM By: Kim Creamer RN, BSN Entered By: [name] on 11/25/2021 08:16:57 -------------------------------------------------------------------------------- [place] Details Patient Name: Date of Service: [name], [name]. 11/25/2021 8:00 A M Medical Record Number: [id number] Patient Account Number: [id number] Date of Birth/Sex: Treating RN: 1940-03-12 (81 y.o. [name] Primary Care Mauri Tolen: [name] Other Clinician: Referring Ladonya Jerkins: Treating Jaelle Campanile/Extender: [name] Weeks in Treatment: 5 Vital Signs Time Taken: 08:07 Temperature (F): 98.2 Pulse (bpm): 89 Respiratory Rate (breaths/min): 18 Blood Pressure (mmHg): 164/84 Reference Range: 80 - 120 mg / dl [place]ay, Temeka [name] ([id number]) [id number] Page 8 of 8 Electronic Signature(s) Signed: 11/25/2021 3:10:50 PM By: Kim Creamer RN, BSN Entered By: [name] on 11/25/2021 08:07:31

## 2021-11-25 NOTE — Progress Notes (Signed)
[name] ([id number]) [id number] Page 1 of 6 Visit Report for 11/25/2021 Debridement Details Patient Name: Date of Service: [name], [name]. 11/25/2021 8:00 A M Medical Record Number: [id number] Patient Account Number: [id number] Date of Birth/Sex: Treating RN: 05-29-1940 (81 y.o. F) Primary Care Provider: Leonides [name] Other Clinician: Referring Provider: Treating Provider/Extender: [name] in Treatment: 5 Debridement Performed for Assessment: Wound #1 Right,Medial Malleolus Performed By: Physician [name]., MD Debridement Type: Debridement Severity of Tissue Pre Debridement: Fat layer exposed Level of Consciousness (Pre-procedure): Awake and Alert Pre-procedure Verification/Time Out Yes - 08:26 Taken: Start Time: 08:26 Pain Control: Lidocaine [name] Area Debrided (L x W): otal 0.5 (cm) x 0.3 (cm) = 0.15 (cm) Tissue and other material debrided: Viable, Non-Viable, Slough, Slough Level: Non-Viable Tissue Debridement Description: Selective/Open Wound Instrument: Curette Bleeding: Minimum Hemostasis Achieved: Pressure End Time: 08:26 Procedural Pain: 0 Post Procedural Pain: 0 Response to Treatment: Procedure was tolerated well Level of Consciousness (Post- Awake and Alert procedure): Post Debridement Measurements of Total Wound Length: (cm) 0.5 Width: (cm) 0.3 Depth: (cm) 0.1 Volume: (cm) 0.012 Character of Wound/Ulcer Post Debridement: Improved Severity of Tissue Post Debridement: Fat layer exposed Post Procedure Diagnosis Same as Pre-procedure Electronic Signature(s) Signed: 11/25/2021 3:30:35 PM By: Kim Ham MD Entered By: [name] on 11/25/2021 08:34:56 -------------------------------------------------------------------------------- HPI Details Patient Name: Date of Service: [name], [name]. 11/25/2021 8:00 A M Medical Record Number: [id number] Patient Account  Number: [id number] Date of Birth/Sex: Treating RN: 08-21-1940 (81 y.o. F) Primary Care Provider: Leonides [name] Other Clinician: Referring Provider: Treating Provider/Extender: [name] in Treatment: 5 History of Present Illness [name] [name] ([id number]) [id number] Page 2 of 6 HPI Description: 10-21-2021 upon evaluation today patient appears to be doing poorly in regard to the wound over the medial aspect of her right ankle. This is an area that previously she has had trouble with previously. I have actually seen her in [place] about a year ago but this was on the left ankle at that time. 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 This is also the typical spot for her as it is a very difficult area to compress. Nonetheless previously we have been able to get this under control using her compression socks as well as Xeroform gauze although I am not sure that is can be the best thing for her at this time we discussed that as we proceed. 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 Nonetheless I do think that we could potentially try some oral steroids, prednisone, to see if we can help calm this down. She is not opposed to this at all. In fact she would like to do anything she can to try to get better she tells me the Achilles area has been very painful at times for her. I really do not see any signs of infection right now but that symptomatic = as well. 10/18; patient with wounds looks a lot better today measuring smaller. She went to her grandsons wedding this weekend  did not wear compression wraps. She  completed her course of prednisone 50 mg we have been using TCA and Hydrofera Blue on the wound area. 11-11-2021 upon evaluation today patient appears to be doing well currently in regard to her wound in fact she is showing signs of good granulation and epithelization at this point and I am very pleased with where we stand currently. I do not see any signs of infection locally or systemically which is great news. No fevers, chills, nausea, vomiting, or diarrhea. I do believe that the anti-inflammatories did do well for her. 11-18-2021 upon evaluation today patient's wound is actually showing signs of excellent improvement and very pleased with where we were seeing this improvement thus far. I do not see any evidence of active infection locally or systemically which is great news and overall I do believe we are on the right track here. 11/8; patient comes in complaining of marked pain in this area. She takes Tylenol for the discomfort. Unfortunately we are not making much progress in the condition of this wound still slough covered. 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 11/25/2021 8:00 A M Medical Record Number: [id number] Patient Account Number: [id number] Date of Birth/Sex: Treating RN: 12-Aug-1940 (81 y.o. F) Primary Care Provider: Leonides [name] Other Clinician: Referring Provider: Treating Provider/Extender: [name] in Treatment: 5 Constitutional Patient is hypertensive.. Pulse regular and within target range for patient.[name] Respirations regular, non-labored and within target range..  Temperature is normal and within the target range for the patient.[name] Appears in no distress. Notes Wound exam;; small open area 100% covered in adherent slough. I was unable to clean this out with a Q-tip I therefore used a #3 curette she did not tolerate this well. Minimal bleeding. 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 Very clear evidence of venous hypertension with dilated venules. There is no evidence of infection. Electronic Signature(s) Signed: 11/25/2021 3:30:35 PM By: Kim Ham MD Entered By: [name] on 11/25/2021 08:38:05 -------------------------------------------------------------------------------- Physician Orders Details Patient Name: Date of Service: [name], [name]. 11/25/2021 8:00 A M Medical Record Number: [id number] Patient Account Number: [id number] Date of Birth/Sex: Treating RN: 05/04/1940 (81 y.o. [name], [name] Primary Care Provider: Leonides [name] Other Clinician: Referring Provider: Treating Provider/Extender: [name] in Treatment: 5 [place], Lory [name] ([id number]) [id number] Page 3 of 6 Verbal / Phone Orders: No Diagnosis Coding Follow-up Appointments ppointment in 1 week. - w/ Jeri Cos, PA Wednesday Return A Anesthetic (In clinic) Topical Lidocaine 5% applied to wound bed Bathing/ Shower/ Hygiene May shower with protection but do not get wound dressing(s) wet. Edema Control - Lymphedema / SCD / Other Elevate legs to the level of the heart or above for 30 minutes daily and/or when sitting, a frequency of: - 3-4 times a day throughout the day. Avoid standing for long periods of time. Exercise regularly Wound Treatment Wound #1 - Malleolus Wound Laterality: Right, Medial Cleanser: Soap and Water Every Other Day/15 Days Discharge Instructions: May shower and wash wound with dial antibacterial soap  and water prior to dressing change. Cleanser: Wound Cleanser (DME) (Generic) Every Other Day/15 Days Discharge Instructions: Cleanse the wound with wound cleanser prior to applying a clean  dressing using gauze sponges, not tissue or cotton balls. Peri-Wound Care: Skin Prep (DME) (Generic) Every Other Day/15 Days Discharge Instructions: Use skin prep as directed Peri-Wound Care: Sween Lotion (Moisturizing lotion) Every Other Day/15 Days Discharge Instructions: Apply moisturizing lotion as directed Topical: Triamcinolone Every Other Day/15 Days Discharge Instructions: Apply Triamcinolone directly to wound bed. Prim Dressing: Hydrofera Blue Ready Foam, 4x5 in Every Other Day/15 Days ary Discharge Instructions: Apply to wound bed as instructed Secondary Dressing: Woven Gauze Sponge, Non-Sterile 4x4 in (DME) (Generic) Every Other Day/15 Days Discharge Instructions: Apply over primary dressing as directed. Secondary Dressing: Zetuvit Plus Silicone Border Dressing 4x4 (in/in) (DME) (Generic) Every Other Day/15 Days Discharge Instructions: Apply silicone border over primary dressing as directed. Patient Medications llergies: No Known Allergies A Notifications Medication Indication Start End PRN debridements/pain11/08/2021 lidocaine DOSE topical 4 % gel - gel topical Electronic Signature(s) Signed: 11/25/2021 3:30:35 PM By: Kim Ham MD Signed: 11/25/2021 3:55:23 PM By: Rhae Hammock RN Entered By: Rhae Hammock on 11/25/2021 08:34:43 -------------------------------------------------------------------------------- Problem List Details Patient Name: Date of Service: [name], [name]. 11/25/2021 8:00 A M Medical Record Number: [id number] Patient Account Number: [id number] Date of Birth/Sex: Treating RN: 05-Nov-1940 (81 y.o. F) Primary Care Provider: Leonides [name] Other Clinician: Referring Provider: Treating Provider/Extender: [name] in  Treatment: 5 [place], Wilmoth [name] ([id number]) [id number] Page 4 of 6 Active Problems ICD-10 Encounter Code Description Active Date MDM Diagnosis L95.0 Livedoid vasculitis 10/28/2021 No Yes I87.331 Chronic venous hypertension (idiopathic) with ulcer and inflammation of right 10/21/2021 No Yes lower extremity L97.312 Non-pressure chronic ulcer of right ankle with fat layer exposed 10/21/2021 No Yes Inactive Problems Resolved Problems Electronic Signature(s) Signed: 11/25/2021 3:30:35 PM By: Kim Ham MD Entered By: [name] on 11/25/2021 08:34:33 -------------------------------------------------------------------------------- Progress Note Details Patient Name: Date of Service: [name], [name]. 11/25/2021 8:00 A M Medical Record Number: [id number] Patient Account Number: [id number] Date of Birth/Sex: Treating RN: 03-11-1940 (81 y.o. F) Primary Care Provider: Leonides [name] Other Clinician: Referring Provider: Treating Provider/Extender: Lina Sayre, Purcell Mouton [name] in Treatment: 5 Subjective History of Present Illness (HPI) 10-21-2021 upon evaluation today patient appears to be doing poorly in regard to the wound over the medial aspect of her right ankle. This is an area that previously she has had trouble with previously. I have actually seen her in [place] about a year ago but this was on the left ankle at that time. 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 This is also the typical spot for her as it is a very difficult area to compress. Nonetheless previously we have been able to get this under control using her compression socks as well as Xeroform gauze although I am not sure that is can be the best thing for her at this time we discussed that as we proceed. Patient does have chronic venous hypertension and a history of vasculitis but otherwise no major medical  problems. 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 Nonetheless I do think that we could potentially try some oral steroids, prednisone, to see if we can help calm this down. She is not opposed to this at all. In fact she would like  to do anything she can to try to get better she tells me the Achilles area has been very painful at times for her. I really do not see any signs of infection right now but that symptomatic = as well. 10/18; patient with wounds looks a lot better today measuring smaller. She went to her grandsons wedding this weekend did not wear compression wraps. She completed her course of prednisone 50 mg we have been using TCA and Hydrofera Blue on the wound area. 11-11-2021 upon evaluation today patient appears to be doing well currently in regard to her wound in fact she is showing signs of good granulation and epithelization at this point and I am very pleased with where we stand currently. I do not see any signs of infection locally or systemically which is great news. No fevers, chills, nausea, vomiting, or diarrhea. I do believe that the anti-inflammatories did do well for her. 11-18-2021 upon evaluation today patient's wound is actually showing signs of excellent improvement and very pleased with where we were seeing this improvement thus far. I do not see any evidence of active infection locally or systemically which is great news and overall I do believe we are on the right track here. 11/8; patient comes in complaining of marked pain in this area. She takes Tylenol for the discomfort. Unfortunately we are not making much progress in the condition of this wound still slough covered. 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 Pulse regular and within target range for patient.[name] Respirations regular, non-labored and within target range.. Temperature is normal and within the target range for the patient.[name] Appears in no distress. Vitals Time Taken: 8:07 AM, Temperature: 98.2 F, Pulse: 89 bpm, Respiratory Rate: 18 breaths/min, Blood Pressure: 164/84 mmHg. General Notes: Wound exam;; small open area 100% covered in adherent slough. I was unable to clean this out with a Q-tip I therefore used a #3 curette she did not tolerate this well. Minimal bleeding. 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 Very clear evidence of venous hypertension with dilated venules. There is no evidence of infection. Integumentary (Hair, Skin) Wound #1 status is Open. Original cause of wound was Gradually Appeared. The date acquired was: 09/30/2021. The wound has been in treatment 5 [name]. The wound is located on the Right,Medial Malleolus. The wound measures 0.5cm length x 0.3cm width x 0.1cm depth; 0.118cm^2 area and 0.012cm^3 volume. There is Fat Layer (Subcutaneous Tissue) exposed. There is no tunneling or undermining noted. There is a medium amount of serosanguineous drainage noted. The wound margin is distinct with the outline attached to the wound base. There is small (1-33%) red, pink granulation within the wound bed. There is a large (67- 100%) amount of necrotic tissue within the wound bed including Eschar and Adherent Slough. The periwound skin appearance did not exhibit: Callus, Crepitus, Excoriation, Induration, Rash, Scarring, Dry/Scaly, Maceration, Atrophie Blanche, Cyanosis, Ecchymosis, Hemosiderin Staining, Mottled, Pallor, Rubor, Erythema. Periwound temperature was noted as No Abnormality. The periwound has tenderness on  palpation. Assessment Active Problems ICD-10 Livedoid vasculitis Chronic venous hypertension (idiopathic) with ulcer and inflammation of right lower extremity Non-pressure chronic ulcer of right ankle with fat layer exposed Procedures Wound #1 Pre-procedure diagnosis of Wound #1 is a Venous Leg Ulcer located on the Right,Medial Malleolus .Severity of Tissue Pre Debridement is:  Fat layer exposed. There was a Selective/Open Wound Non-Viable Tissue Debridement with a total area of 0.15 sq cm performed by [name]., MD. With the following instrument(s): Curette to remove Viable and Non-Viable tissue/material. Material removed includes [facility] after achieving pain control using Lidocaine. No specimens were taken. A time out was conducted at 08:26, prior to the start of the procedure. A Minimum amount of bleeding was controlled with Pressure. The procedure was tolerated well with a pain level of 0 throughout and a pain level of 0 following the procedure. Post Debridement Measurements: 0.5cm length x 0.3cm width x 0.1cm depth; 0.012cm^3 volume. Character of Wound/Ulcer Post Debridement is improved. Severity of Tissue Post Debridement is: Fat layer exposed. Post procedure Diagnosis Wound #1: Same as Pre-Procedure Plan Follow-up Appointments: Return Appointment in 1 week. - w/ Jeri Cos, PA Wednesday Anesthetic: (In clinic) Topical Lidocaine 5% applied to wound bed Bathing/ Shower/ Hygiene: May shower with protection but do not get wound dressing(s) wet. Edema Control - Lymphedema / SCD / Other: Elevate legs to the level of the heart or above for 30 minutes daily and/or when sitting, a frequency of: - 3-4 times a day throughout the day. Avoid standing for long periods of time. Exercise regularly The following medication(s) was prescribed: lidocaine topical 4 % gel gel topical for PRN debridements/pain was prescribed at facility WOUND #1: - Malleolus Wound Laterality: Right,  Medial Cleanser: Soap and Water Every Other Day/15 Days Discharge Instructions: May shower and wash wound with dial antibacterial soap and water prior to dressing change. Cleanser: Wound Cleanser (DME) (Generic) Every Other Day/15 Days Discharge Instructions: Cleanse the wound with wound cleanser prior to applying a clean dressing using gauze sponges, not tissue or cotton balls. Peri-Wound Care: Skin Prep (DME) (Generic) Every Other Day/15 Days Discharge Instructions: Use skin prep as directed Peri-Wound Care: Sween Lotion (Moisturizing lotion) Every Other Day/15 Days Discharge Instructions: Apply moisturizing lotion as directed Topical: Triamcinolone Every Other Day/15 Days Discharge Instructions: Apply Triamcinolone directly to wound bed. Prim Dressing: Hydrofera Blue Ready Foam, 4x5 in Every Other Day/15 Days ary Discharge Instructions: Apply to wound bed as instructed Secondary Dressing: Woven Gauze Sponge, Non-Sterile 4x4 in (DME) (Generic) Every Other Day/15 Days [name], [name] ([id number]) [id number] Page 6 of 6 Discharge Instructions: Apply over primary dressing as directed. Secondary Dressing: Zetuvit Plus Silicone Border Dressing 4x4 (in/in) (DME) (Generic) Every Other Day/15 Days Discharge Instructions: Apply silicone border over primary dressing as directed. 1. The patient is not doing well. I am going to let her change her own dressing I have prescribed triamcinolone ointment 15 g 0.1% to apply to the wound and the wound area every second day, Hydrofera Blue, border foam and the patient's own stocking. She will change this every second day. Electronic Signature(s) Signed: 11/25/2021 3:30:35 PM By: Kim Ham MD Entered By: [name] on 11/25/2021 08:44:11 -------------------------------------------------------------------------------- SuperBill Details Patient Name: Date of Service: [name], Nyonna [name]. 11/25/2021 Medical Record Number:  [id number] Patient Account Number: [id number] Date of Birth/Sex: Treating RN: 04-21-1940 (81 y.o. 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.0 Livedoid vasculitis I87.331 Chronic venous hypertension (idiopathic) with ulcer and inflammation of right lower extremity L97.312 Non-pressure chronic ulcer of right ankle with fat layer exposed Facility Procedures : CPT4 Code: [id number] Description: 09323 - DEBRIDE WOUND 1ST 20 SQ CM OR < ICD-10 Diagnosis  Description L97.312 Non-pressure chronic ulcer of right ankle with fat layer exposed Modifier: Quantity: 1 Physician Procedures : CPT4 Code Description Modifier [id number] 50016 - WC PHYS DEBR WO ANESTH 20 SQ CM ICD-10 Diagnosis Description Y29.037 Non-pressure chronic ulcer of right ankle with fat layer exposed Quantity: 1 Electronic Signature(s) Signed: 11/25/2021 3:30:35 PM By: Kim Ham MD Entered By: [name] on 11/25/2021 08:45:11

## 2021-11-26 DIAGNOSIS — L97312 Non-pressure chronic ulcer of right ankle with fat layer exposed: Secondary | ICD-10-CM | POA: Diagnosis not present

## 2021-12-02 ENCOUNTER — Encounter (HOSPITAL_BASED_OUTPATIENT_CLINIC_OR_DEPARTMENT_OTHER): Payer: Medicare Other | Admitting: Physician Assistant

## 2021-12-02 DIAGNOSIS — L950 Livedoid vasculitis: Secondary | ICD-10-CM | POA: Diagnosis not present

## 2021-12-02 DIAGNOSIS — I87331 Chronic venous hypertension (idiopathic) with ulcer and inflammation of right lower extremity: Secondary | ICD-10-CM | POA: Diagnosis not present

## 2021-12-02 DIAGNOSIS — L97312 Non-pressure chronic ulcer of right ankle with fat layer exposed: Secondary | ICD-10-CM | POA: Diagnosis not present

## 2021-12-04 NOTE — Progress Notes (Signed)
[name], [name] ([id number]) [id number] Page 1 of 6 Visit Report for 12/02/2021 Chief Complaint Document Details Patient Name: Date of Service: [name], [name] 12/02/2021 8:45 A M Medical Record Number: [id number] Patient Account Number: [id number] Date of Birth/Sex: Treating RN: [birth year] (81 y.o. F) Primary Care Provider: [name] Other Clinician: Referring Provider: Treating Provider/Extender: [name] Weeks in Treatment: 6 Information Obtained from: Patient Chief Complaint Right ankle ulcer Electronic Signature(s) Signed: 12/02/2021 3:58:30 PM By: [name] PA-C Entered By: [name] on 12/02/2021 08:48:37 -------------------------------------------------------------------------------- HPI Details Patient Name: Date of Service: [name], [name]. 12/02/2021 8:45 A M Medical Record Number: [id number] Patient Account Number: [id number] Date of Birth/Sex: Treating RN: 09/12/1940 (81 y.o. F) Primary Care Provider: [name] Other Clinician: Referring Provider: Treating Provider/Extender: [name] Weeks in Treatment: 6 History of Present Illness HPI Description: 10-21-2021 upon evaluation today patient appears to be doing poorly in regard to the wound over the medial aspect of her right ankle. This is an area that previously she has had trouble with previously. I have actually seen her in [place] about a year ago but this was on the left ankle at that time. 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 This is also the typical spot for her as it is a very difficult area to compress. Nonetheless previously we have been able to get this under control using her compression socks as well as Xeroform gauze although I am not sure that is can be the best thing for her at this time we discussed that as we  proceed. 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 Nonetheless I do think that we could potentially try some oral steroids, prednisone, to see if we can help calm this down. She is not opposed to this at all. In fact she would like to do anything she can to try to get better she tells me the Achilles area has been very painful at times for her. I really do not see any signs of infection right now but that symptomatic = as well. 10/18; patient with wounds looks a lot better today measuring smaller. She went to her grandsons wedding this weekend did not wear compression wraps. She completed her course of prednisone 50 mg we have been using TCA and Hydrofera Blue on the wound area. 11-11-2021 upon evaluation today patient appears to be doing well currently in regard to her wound in fact she is showing signs of good granulation and epithelization at this point and I am very pleased with where we stand currently. I do not see any signs of infection locally or systemically which is great news. No fevers, chills, nausea, vomiting, or diarrhea. I do believe that the anti-inflammatories did do well for her. 11-18-2021 upon evaluation today patient's wound is actually showing signs of excellent improvement and very pleased with where we were seeing this improvement thus far. I do not see any evidence of active infection locally or systemically which is great news and overall I do believe we are on the  right track here. 11/8; patient comes in complaining of marked pain in this area. She takes Tylenol for the discomfort. Unfortunately we are not making much progress in the condition of this wound still slough  covered. 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 With that being said there does not appear to be any signs of infection which is good news but the wound does not appear to be quite as healthy as what I saw the last time I saw her in 2 weeks and looking back at pictures I really feel like that there were very small compared to where we started there was a little bit of discoloration of the base of the wound that was not there last [name] ([id number]) [id number] Page 2 of 6 week. Some of this was probably just due to bleeding which is understandable from and following debridement but at the same time I do believe that nonetheless she still would benefit from the compression wrapping currently. She is definitely more swollen today than she was previous and she realizes this as well. Electronic Signature(s) Signed: 12/02/2021 9:13:46 AM By: [name] PA-C Entered By: [name] on 12/02/2021 09:13:46 -------------------------------------------------------------------------------- Physical Exam Details Patient Name: Date of Service: [name], [name] 12/02/2021 8:45 A M Medical Record Number: [id number] Patient Account Number: [id number] Date of Birth/Sex: Treating RN: Apr 10, 1940 (81 y.o. F) Primary Care Provider: [name] Other Clinician: Referring Provider: Treating Provider/Extender: [name] Weeks in Treatment: 6 Constitutional Well-nourished and well-hydrated in no acute distress. Respiratory normal breathing without difficulty. Psychiatric this patient is able to make decisions and demonstrates good  insight into disease process. Alert and Oriented x 3. pleasant and cooperative. Notes Upon inspection patient's wound bed actually showed signs of good granulation and epithelization at this point. Fortunately there does not appear to be any evidence of infection locally or systemically which is great news and overall I am extremely pleased with where we stand today. Electronic Signature(s) Signed: 12/02/2021 9:14:01 AM By: [name] PA-C Entered By: [name] on 12/02/2021 09:14:01 -------------------------------------------------------------------------------- Physician Orders Details Patient Name: Date of Service: [name], [name]. 12/02/2021 8:45 A M Medical Record Number: [id number] Patient Account Number: [id number] Date of Birth/Sex: Treating RN: January 27, 1940 (81 y.o. [name], [name] Primary Care Provider: [name] Other Clinician: Referring Provider: Treating Provider/Extender: [name] Weeks in Treatment: 6 Verbal / Phone Orders: No Diagnosis Coding Follow-up Appointments ppointment in 1 week. - w/ Jeri Cos, PA Wednesday Return A Anesthetic (In clinic) Topical Lidocaine 5% applied to wound bed Bathing/ Shower/ Hygiene May shower with protection but do not get wound dressing(s) wet. Edema Control - Lymphedema / SCD / Other Elevate legs to the level of the heart or above for 30 minutes daily and/or when sitting, a frequency of: - 3-4 times a day throughout the day. Avoid standing for long periods of time. Exercise regularly [name], [name] ([id number]) [id number] Page 3 of 6 Wound Treatment Wound #1 - Malleolus Wound Laterality: Right, Medial Cleanser: Soap and Water Every Other Day/15 Days Discharge Instructions: May shower and wash wound with dial antibacterial soap and water prior to  dressing change. Cleanser: Wound Cleanser (Generic) Every Other Day/15 Days Discharge Instructions:  Cleanse the wound with wound cleanser prior to applying a clean dressing using gauze sponges, not tissue or cotton balls. Peri-Wound Care: Skin Prep (Generic) Every Other Day/15 Days Discharge Instructions: Use skin prep as directed Peri-Wound Care: Sween Lotion (Moisturizing lotion) Every Other Day/15 Days Discharge Instructions: Apply moisturizing lotion as directed Topical: Triamcinolone Every Other Day/15 Days Discharge Instructions: Apply Triamcinolone directly to wound bed and periwound Prim Dressing: Hydrofera Blue Ready Foam, 4x5 in Every Other Day/15 Days ary Discharge Instructions: Apply to wound bed as instructed Secondary Dressing: Woven Gauze Sponge, Non-Sterile 4x4 in Every Other Day/15 Days Discharge Instructions: Apply over primary dressing as directed. Compression Wrap: ThreePress (3 layer compression wrap) Every Other Day/15 Days Discharge Instructions: Apply three layer compression as directed. Electronic Signature(s) Signed: 12/02/2021 3:58:30 PM By: [name] PA-C Signed: 12/04/2021 12:09:11 PM By: Rhae Hammock RN Entered By: Rhae Hammock on 12/02/2021 08:59:20 -------------------------------------------------------------------------------- Problem List Details Patient Name: Date of Service: [name], [name]. 12/02/2021 8:45 A M Medical Record Number: [id number] Patient Account Number: [id number] Date of Birth/Sex: Treating RN: 21-Nov-1940 (81 y.o. F) Primary Care Provider: [name] Other Clinician: Referring Provider: Treating Provider/Extender: [name] Weeks in Treatment: 6 Active Problems ICD-10 Encounter Code Description Active Date MDM Diagnosis L95.0 Livedoid vasculitis 10/28/2021 No Yes I87.331 Chronic venous hypertension (idiopathic) with ulcer and inflammation of right 10/21/2021 No Yes lower extremity L97.312 Non-pressure chronic ulcer of right ankle with fat layer exposed 10/21/2021 No  Yes Inactive Problems Resolved Problems [name], [name] ([id number]) [id number] Page 4 of 6 Electronic Signature(s) Signed: 12/02/2021 3:58:30 PM By: [name] PA-C Entered By: [name] on 12/02/2021 08:48:32 -------------------------------------------------------------------------------- Progress Note Details Patient Name: Date of Service: [name], [name]. 12/02/2021 8:45 A M Medical Record Number: [id number] Patient Account Number: [id number] Date of Birth/Sex: Treating RN: 02-24-1940 (81 y.o. F) Primary Care Provider: [name] Other Clinician: Referring Provider: Treating Provider/Extender: [name] Weeks in Treatment: 6 Subjective Chief Complaint Information obtained from Patient Right ankle ulcer History of Present Illness (HPI) 10-21-2021 upon evaluation today patient appears to be doing poorly in regard to the wound over the medial aspect of her right ankle. This is an area that previously she has had trouble with previously. I have actually seen her in [place] about a year ago but this was on the left ankle at that time. 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 This is also the typical spot for her as it is a very difficult area to compress. Nonetheless previously we have been able to get this under control using her compression socks as well as Xeroform gauze although I am not sure that is can be the best thing for her at this time we discussed that as we proceed. 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 Nonetheless I do think  that we could potentially try some oral steroids, prednisone, to see if we can help calm this down. She is not opposed to  this at all. In fact she would like to do anything she can to try to get better she tells me the Achilles area has been very painful at times for her. I really do not see any signs of infection right now but that symptomatic = as well. 10/18; patient with wounds looks a lot better today measuring smaller. She went to her grandsons wedding this weekend did not wear compression wraps. She completed her course of prednisone 50 mg we have been using TCA and Hydrofera Blue on the wound area. 11-11-2021 upon evaluation today patient appears to be doing well currently in regard to her wound in fact she is showing signs of good granulation and epithelization at this point and I am very pleased with where we stand currently. I do not see any signs of infection locally or systemically which is great news. No fevers, chills, nausea, vomiting, or diarrhea. I do believe that the anti-inflammatories did do well for her. 11-18-2021 upon evaluation today patient's wound is actually showing signs of excellent improvement and very pleased with where we were seeing this improvement thus far. I do not see any evidence of active infection locally or systemically which is great news and overall I do believe we are on the right track here. 11/8; patient comes in complaining of marked pain in this area. She takes Tylenol for the discomfort. Unfortunately we are not making much progress in the condition of this wound still slough covered. 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 With that being said there does not  appear to be any signs of infection which is good news but the wound does not appear to be quite as healthy as what I saw the last time I saw her in 2 weeks and looking back at pictures I really feel like that there were very small compared to where we started there was a little bit of discoloration of the base of the wound that was not there last week. Some of this was probably just due to bleeding which is understandable from and following debridement but at the same time I do believe that nonetheless she still would benefit from the compression wrapping currently. She is definitely more swollen today than she was previous and she realizes this as well. Objective Constitutional Well-nourished and well-hydrated in no acute distress. Vitals Time Taken: 8:20 AM, Temperature: 98.1 F, Pulse: 82 bpm, Respiratory Rate: 18 breaths/min, Blood Pressure: 154/95 mmHg. Respiratory normal breathing without difficulty. Psychiatric this patient is able to make decisions and demonstrates good insight into disease process. Alert and Oriented x 3. pleasant and cooperative. [name], [name] ([id number]) [id number] Page 5 of 6 General Notes: Upon inspection patient's wound bed actually showed signs of good granulation and epithelization at this point. Fortunately there does not appear to be any evidence of infection locally or systemically which is great news and overall I am extremely pleased with where we stand today. Integumentary (Hair, Skin) Wound #1 status is Open. Original cause of wound was Gradually Appeared. The date acquired was: 09/30/2021. The wound has been in treatment 6 weeks. The wound is located on the Right,Medial Malleolus. The wound measures 0.5cm length x 0.5cm width x 0.1cm depth; 0.196cm^2 area and  0.02cm^3 volume. There is Fat Layer (Subcutaneous Tissue) exposed. There is no tunneling or undermining noted. There is a medium amount of serosanguineous drainage  noted. The wound margin is distinct with the outline attached to the wound base. There is small (1-33%) red, pink granulation within the wound bed. There is a large (67- 100%) amount of necrotic tissue within the wound bed including Eschar and Adherent Slough. The periwound skin appearance did not exhibit: Callus, Crepitus, Excoriation, Induration, Rash, Scarring, Dry/Scaly, Maceration, Atrophie Blanche, Cyanosis, Ecchymosis, Hemosiderin Staining, Mottled, Pallor, Rubor, Erythema. Periwound temperature was noted as No Abnormality. The periwound has tenderness on palpation. Assessment Active Problems ICD-10 Livedoid vasculitis Chronic venous hypertension (idiopathic) with ulcer and inflammation of right lower extremity Non-pressure chronic ulcer of right ankle with fat layer exposed Procedures Wound #1 Pre-procedure diagnosis of Wound #1 is a Venous Leg Ulcer located on the Right,Medial Malleolus . There was a Three Layer Compression Therapy Procedure by Rhae Hammock, RN. Post procedure Diagnosis Wound #1: Same as Pre-Procedure Plan Follow-up Appointments: Return Appointment in 1 week. - w/ Jeri Cos, PA Wednesday Anesthetic: (In clinic) Topical Lidocaine 5% applied to wound bed Bathing/ Shower/ Hygiene: May shower with protection but do not get wound dressing(s) wet. Edema Control - Lymphedema / SCD / Other: Elevate legs to the level of the heart or above for 30 minutes daily and/or when sitting, a frequency of: - 3-4 times a day throughout the day. Avoid standing for long periods of time. Exercise regularly WOUND #1: - Malleolus Wound Laterality: Right, Medial Cleanser: Soap and Water Every Other Day/15 Days Discharge Instructions: May shower and wash wound with dial antibacterial soap and water prior to dressing change. Cleanser: Wound Cleanser (Generic) Every Other Day/15 Days Discharge Instructions: Cleanse the wound with wound cleanser prior to applying a clean dressing  using gauze sponges, not tissue or cotton balls. Peri-Wound Care: Skin Prep (Generic) Every Other Day/15 Days Discharge Instructions: Use skin prep as directed Peri-Wound Care: Sween Lotion (Moisturizing lotion) Every Other Day/15 Days Discharge Instructions: Apply moisturizing lotion as directed Topical: Triamcinolone Every Other Day/15 Days Discharge Instructions: Apply Triamcinolone directly to wound bed and periwound Prim Dressing: Hydrofera Blue Ready Foam, 4x5 in Every Other Day/15 Days ary Discharge Instructions: Apply to wound bed as instructed Secondary Dressing: Woven Gauze Sponge, Non-Sterile 4x4 in Every Other Day/15 Days Discharge Instructions: Apply over primary dressing as directed. Com pression Wrap: ThreePress (3 layer compression wrap) Every Other Day/15 Days Discharge Instructions: Apply three layer compression as directed. 1. I am going to recommend based on what we are seeing that we go ahead and initiate a continuation of treatment with the [facility] which I think is doing a pretty good job here. We may consider collagen in the future I will to see what the wound looks like next week. 2. I am also going to suggest that we have the patient continue to utilize a 3 layer compression wrap which I think is doing a pretty good job here. 3. I am also going to suggest that she should continue with the triamcinolone to the wound bed and periwound which I think is doing a really good job as well. We will see patient back for reevaluation in 1 week here in the clinic. If anything worsens or changes patient will contact our office for additional recommendations. Electronic Signature(s) Signed: 12/02/2021 9:14:32 AM By: Edger House, [name] ([id number]) AM By: Irean Hong [phone].pdf Page 6 of 6 Signed:  12/02/2021 9:14:32 Entered By: [name] on 12/02/2021  09:14:32 -------------------------------------------------------------------------------- SuperBill Details Patient Name: Date of Service: Kim Lance [name]. 12/02/2021 Medical Record Number: [id number] Patient Account Number: [id number] Date of Birth/Sex: Treating RN: 03/08/1940 (81 y.o. [name], [name] Primary Care Provider: [name] Other Clinician: Referring Provider: Treating Provider/Extender: [name] Weeks in Treatment: 6 Diagnosis Coding ICD-10 Codes Code Description L95.0 Livedoid vasculitis I87.331 Chronic venous hypertension (idiopathic) with ulcer and inflammation of right lower extremity L97.312 Non-pressure chronic ulcer of right ankle with fat layer exposed Facility Procedures : CPT4 Code: [id number] Description: (Facility Use Only) [phone] - [facility] RT LEG Modifier: Quantity: 1 Physician Procedures : CPT4 Code Description Modifier [id number] - WC PHYS LEVEL 3 - EST PT ICD-10 Diagnosis Description 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

## 2021-12-07 NOTE — Progress Notes (Signed)
KAELI, NICHELSON T ([id number]) [id number] Page 1 of 6 Visit Report for 12/02/2021 Arrival Information Details Patient Name: Date of Service: Kim, [name] 12/02/2021 8:45 A M Medical Record Number: [id number] Patient Account Number: [id number] Date of Birth/Sex: Treating RN: 28-Jun-1940 (81 y.o. Donalda Ewings Primary Care Davan Hark: [name] Other Clinician: Referring Ponciano Shealy: Treating Benigna Delisi/Extender: [name] Weeks in Treatment: 6 Visit Information History Since Last Visit Added or deleted any medications: No Patient Arrived: Ambulatory Any new allergies or adverse reactions: No Arrival Time: 08:42 Had a fall or experienced change in No Accompanied By: husband activities of daily living that may affect Transfer Assistance: None risk of falls: Patient Identification Verified: Yes Signs or symptoms of abuse/neglect since last visito No Secondary Verification Process Completed: Yes Hospitalized since last visit: No Patient Requires Transmission-Based Precautions: No Implantable device outside of the clinic excluding No Patient Has Alerts: No cellular tissue based products placed in the center since last visit: Has Dressing in Place as Prescribed: Yes Has Compression in Place as Prescribed: Yes Pain Present Now: Yes Electronic Signature(s) Signed: 12/07/2021 3:46:43 PM By: Sharyn Creamer RN, BSN Entered By: Sharyn Creamer on 12/02/2021 08:46:39 -------------------------------------------------------------------------------- Compression Therapy Details Patient Name: Date of Service: Kim Catholic, [name] 12/02/2021 8:45 A M Medical Record Number: [id number] Patient Account Number: [id number] Date of Birth/Sex: Treating RN: 1940-06-13 (81 y.o. [name], [name] Primary Care Devri Kreher: [name] Other Clinician: Referring Vale Peraza: Treating Fadumo Heng/Extender: [name] Weeks in Treatment: 6 Compression Therapy Performed for Wound Assessment: Wound #1 Right,Medial Malleolus Performed By: Clinician Rhae Hammock, RN Compression Type: Three Layer Post Procedure Diagnosis Same as Pre-procedure Electronic Signature(s) Signed: 12/04/2021 12:09:11 PM By: Rhae Hammock RN Entered By: Rhae Hammock on 12/02/2021 08:59:48 Wagoner, Madhavi T ([id number]) [id number] Page 2 of 6 -------------------------------------------------------------------------------- Encounter Discharge Information Details Patient Name: Date of Service: [name] 12/02/2021 8:45 A M Medical Record Number: [id number] Patient Account Number: [id number] Date of Birth/Sex: Treating RN: 1940-09-15 (81 y.o. [name], [name] Primary Care Kristeen Lantz: [name] Other Clinician: Referring Fariha Goto: Treating [name] Riendeau/Extender: [name] Weeks in Treatment: 6 Encounter Discharge Information Items Discharge Condition: Stable Ambulatory Status: Ambulatory Discharge Destination: Home Transportation: Private Auto Accompanied By: husband Schedule Follow-up Appointment: Yes Clinical Summary of Care: Patient Declined Electronic Signature(s) Signed: 12/04/2021 12:09:11 PM By: Rhae Hammock RN Entered By: Rhae Hammock on 12/02/2021 09:00:25 -------------------------------------------------------------------------------- Lower Extremity Assessment Details Patient Name: Date of Service: Kim Catholic, [name] 12/02/2021 8:45 A M Medical Record Number: [id number] Patient Account Number: [id number] Date of Birth/Sex: Treating RN: 1940-05-12 (81 y.o. Donalda Ewings Primary Care Mayfield Schoene: [name] Other Clinician: Referring Jarica Plass: Treating Ayoub Arey/Extender: [name] Weeks in Treatment: 6 Edema Assessment Assessed: [Left: No] [Right: No] Edema: [Left: Ye] [Right:  s] Calf Left: Right: Point of Measurement: 37 cm From Medial Instep 38.5 cm Ankle Left: Right: Point of Measurement: 8 cm From Medial Instep 22.6 cm Vascular Assessment Pulses: Dorsalis Pedis Palpable: [Right:Yes] Electronic Signature(s) Signed: 12/07/2021 3:46:43 PM By: Sharyn Creamer RN, BSN Entered By: Sharyn Creamer on 12/02/2021 08:36:31 [place] Details -------------------------------------------------------------------------------- [name] ([id number]) [id number] Page 3 of 6 Patient Name: Date of Service: [name] 12/02/2021 8:45 A M Medical Record Number: [id number] Patient Account Number: [id number] Date of Birth/Sex: Treating RN: 1940/04/21 (81 y.o. [name], [place] Primary Care Jonahtan Manseau: Damita Dunnings,  Purcell Mouton Other Clinician: Referring Manuella Blackson: Treating Sruti Ayllon/Extender: [name] Weeks in Treatment: 6 Active Inactive Wound/Skin Impairment Nursing Diagnoses: Impaired tissue integrity Knowledge deficit related to smoking impact on wound healing Goals: Patient will have a decrease in wound volume by X% from date: (specify in notes) Date Initiated: 10/21/2021 Target Resolution Date: 12/18/2021 Goal Status: Active Patient/caregiver will verbalize understanding of skin care regimen Date Initiated: 10/21/2021 Date Inactivated: 11/11/2021 Target Resolution Date: 11/21/2021 Goal Status: Met Ulcer/skin breakdown will have a volume reduction of 30% by week 4 Date Initiated: 10/21/2021 Target Resolution Date: 12/19/2021 Goal Status: Active Interventions: Assess patient/caregiver ability to obtain necessary supplies Assess patient/caregiver ability to perform ulcer/skin care regimen upon admission and as needed Assess ulceration(s) every visit Notes: Electronic Signature(s) Signed: 12/04/2021 12:09:11 PM By: Rhae Hammock RN Entered By: Rhae Hammock on 12/02/2021  08:40:17 -------------------------------------------------------------------------------- Pain Assessment Details Patient Name: Date of Service: Kim Catholic, [name] 12/02/2021 8:45 A M Medical Record Number: [id number] Patient Account Number: [id number] Date of Birth/Sex: Treating RN: September 03, 1940 (81 y.o. Donalda Ewings Primary Care Romonda Parker: [name] Other Clinician: Referring Camdan Burdi: Treating Ruble Pumphrey/Extender: [name] Weeks in Treatment: 6 Active Problems Location of Pain Severity and Description of Pain Patient Has Paino Yes Site Locations Rate the pain. HAZELY, SEALEY T ([id number]) [id number] Page 4 of 6 Rate the pain. Current Pain Level: 3 Character of Pain Describe the Pain: Burning Pain Management and Medication Current Pain Management: Electronic Signature(s) Signed: 12/07/2021 3:46:43 PM By: Sharyn Creamer RN, BSN Entered By: Sharyn Creamer on 12/02/2021 08:46:30 -------------------------------------------------------------------------------- Patient/Caregiver Education Details Patient Name: Date of Service: Thora Lance T. 11/15/2023andnbsp8:45 A M Medical Record Number: [id number] Patient Account Number: [id number] Date of Birth/Gender: Treating RN: 05-13-1940 (81 y.o. [name], [name] Primary Care Physician: [name] Other Clinician: Referring Physician: Treating Physician/Extender: Karie Schwalbe in Treatment: 6 Education Assessment Education Provided To: Patient Education Topics Provided Wound/Skin Impairment: Methods: Explain/Verbal Responses: Reinforcements needed, State content correctly Electronic Signature(s) Signed: 12/04/2021 12:09:11 PM By: Rhae Hammock RN Entered By: Rhae Hammock on 12/02/2021 08:40:29 -------------------------------------------------------------------------------- Wound Assessment Details Patient  Name: Date of Service: Kim Catholic, Aleah T. 12/02/2021 8:45 A M Medical Record Number: [id number] Patient Account Number: [id number] YAHAIRA, BRUSKI ([id number]) [phone].pdf Page 5 of 6 Date of Birth/Sex: Treating RN: 1940/10/25 (81 y.o. Donalda Ewings Primary Care Cordarro Spinnato: Other Clinician: [name] Referring Joss Mcdill: Treating Geremy Rister/Extender: [name] Weeks in Treatment: 6 Wound Status Wound Number: 1 Primary Etiology: Venous Leg Ulcer Wound Location: Right, Medial Malleolus Wound Status: Open Wounding Event: Gradually Appeared Comorbid History: Peripheral Venous Disease Date Acquired: 09/30/2021 Weeks Of Treatment: 6 Clustered Wound: Yes Photos Wound Measurements Length: (cm) 0.5 Width: (cm) 0.5 Depth: (cm) 0.1 Area: (cm) 0.196 Volume: (cm) 0.02 % Reduction in Area: 88.9% % Reduction in Volume: 94.3% Epithelialization: Large (67-100%) Tunneling: No Undermining: No Wound Description Classification: Full Thickness With Exposed Suppo Wound Margin: Distinct, outline attached Exudate Amount: Medium Exudate Type: Serosanguineous Exudate Color: red, brown rt Structures Foul Odor After Cleansing: No Slough/Fibrino Yes Wound Bed Granulation Amount: Small (1-33%) Exposed Structure Granulation Quality: Red, Pink Fascia Exposed: No Necrotic Amount: Large (67-100%) Fat Layer (Subcutaneous Tissue) Exposed: Yes Necrotic Quality: Eschar, Adherent Slough Tendon Exposed: No Muscle Exposed: No Joint Exposed: No Bone Exposed: No Periwound Skin Texture Texture Color No Abnormalities Noted: No No Abnormalities Noted: No Callus: No Atrophie  Blanche: No Crepitus: No Cyanosis: No Excoriation: No Ecchymosis: No Induration: No Erythema: No Rash: No Hemosiderin Staining: No Scarring: No Mottled: No Pallor: No Moisture Rubor: No No Abnormalities Noted: No Dry / Scaly: No Temperature /  Pain Maceration: No Temperature: No Abnormality Tenderness on Palpation: Yes Treatment Notes Wound #1 (Malleolus) Wound Laterality: Right, Medial Cleanser Soap and Water Discharge Instruction: May shower and wash wound with dial antibacterial soap and water prior to dressing change. Wound Cleanser ROMONDA, PARKER T ([id number]) [id number] Page 6 of 6 Discharge Instruction: Cleanse the wound with wound cleanser prior to applying a clean dressing using gauze sponges, not tissue or cotton balls. Peri-Wound Care Skin Prep Discharge Instruction: Use skin prep as directed Sween Lotion (Moisturizing lotion) Discharge Instruction: Apply moisturizing lotion as directed Topical Triamcinolone Discharge Instruction: Apply Triamcinolone directly to wound bed and periwound Primary Dressing Hydrofera Blue Ready Foam, 4x5 in Discharge Instruction: Apply to wound bed as instructed Secondary Dressing Woven Gauze Sponge, Non-Sterile 4x4 in Discharge Instruction: Apply over primary dressing as directed. Secured With Compression Wrap ThreePress (3 layer compression wrap) Discharge Instruction: Apply three layer compression as directed. Compression Stockings Add-Ons Electronic Signature(s) Signed: 12/07/2021 3:46:43 PM By: Sharyn Creamer RN, BSN Entered By: Sharyn Creamer on 12/02/2021 08:34:49 -------------------------------------------------------------------------------- [place] Details Patient Name: Date of Service: Kim Catholic, Tamsin T. 12/02/2021 8:45 A M Medical Record Number: [id number] Patient Account Number: [id number] Date of Birth/Sex: Treating RN: [birth year] (81 y.o. Donalda Ewings Primary Care Stevana Dufner: [name] Other Clinician: Referring Ruben Mahler: Treating Wyatt Galvan/Extender: [name] Weeks in Treatment: 6 Vital Signs Time Taken: 08:20 Temperature (F): 98.1 Pulse (bpm): 82 Respiratory Rate (breaths/min):  18 Blood Pressure (mmHg): 154/95 Reference Range: 80 - 120 mg / dl Electronic Signature(s) Signed: 12/07/2021 3:46:43 PM By: Sharyn Creamer RN, BSN Entered By: Sharyn Creamer on 12/02/2021 08:46:15

## 2021-12-09 ENCOUNTER — Encounter (HOSPITAL_BASED_OUTPATIENT_CLINIC_OR_DEPARTMENT_OTHER): Payer: Medicare Other | Admitting: Physician Assistant

## 2021-12-09 DIAGNOSIS — I87331 Chronic venous hypertension (idiopathic) with ulcer and inflammation of right lower extremity: Secondary | ICD-10-CM | POA: Diagnosis not present

## 2021-12-09 DIAGNOSIS — L950 Livedoid vasculitis: Secondary | ICD-10-CM | POA: Diagnosis not present

## 2021-12-09 DIAGNOSIS — L97312 Non-pressure chronic ulcer of right ankle with fat layer exposed: Secondary | ICD-10-CM | POA: Diagnosis not present

## 2021-12-09 DIAGNOSIS — I872 Venous insufficiency (chronic) (peripheral): Secondary | ICD-10-CM | POA: Diagnosis not present

## 2021-12-09 NOTE — Progress Notes (Addendum)
STEPHENIE, [name] ([id number]) [id number] Page 1 of 7 Visit Report for 12/09/2021 Chief Complaint Document Details Patient Name: Date of Service: [name], [name] 12/09/2021 10:45 A M Medical Record Number: [id number] Patient Account Number: [id number] Date of Birth/Sex: Treating RN: 11-30-1940 (81 y.o. F) Primary Care Provider: [name] Other Clinician: Referring Provider: Treating Provider/Extender: [name] Weeks in Treatment: 7 Information Obtained from: Patient Chief Complaint Right ankle ulcer Electronic Signature(s) Signed: 12/09/2021 10:30:04 AM By: [name] PA-C Entered By: [name] on 12/09/2021 10:30:04 -------------------------------------------------------------------------------- Debridement Details Patient Name: Date of Service: [name], [name] 12/09/2021 10:45 A M Medical Record Number: [id number] Patient Account Number: [id number] Date of Birth/Sex: Treating RN: 1940/06/27 (81 y.o. [name], [name] Primary Care Provider: [name] Other Clinician: Referring Provider: Treating Provider/Extender: [name] Weeks in Treatment: 7 Debridement Performed for Assessment: Wound #1 Right,Medial Malleolus Performed By: Physician [name], PA Debridement Type: Debridement Severity of Tissue Pre Debridement: Fat layer exposed Level of Consciousness (Pre-procedure): Awake and Alert Pre-procedure Verification/Time Out Yes - 11:20 Taken: Start Time: 11:20 Pain Control: Lidocaine T Area Debrided (L x W): otal 1.5 (cm) x 0.4 (cm) = 0.6 (cm) Tissue and other material debrided: Viable, Non-Viable, Slough, Subcutaneous, Slough Level: Skin/Subcutaneous Tissue Debridement Description: Excisional Instrument: Curette Bleeding: Minimum Hemostasis Achieved: Pressure End Time: 11:20 Procedural Pain: 0 Post Procedural Pain: 0 Response to  Treatment: Procedure was tolerated well Level of Consciousness (Post- Awake and Alert procedure): Post Debridement Measurements of Total Wound Length: (cm) 1.5 Width: (cm) 0.4 Depth: (cm) 0.2 Volume: (cm) 0.094 Character of Wound/Ulcer Post Debridement: Improved Severity of Tissue Post Debridement: Fat layer exposed Edmondson, Breionna T ([id number]) [id number] Page 2 of 7 Post Procedure Diagnosis Same as Pre-procedure Electronic Signature(s) Signed: 12/09/2021 2:30:20 PM By: [name] PA-C Signed: 12/09/2021 3:58:15 PM By: Rhae Hammock RN Entered By: Rhae Hammock on 12/09/2021 11:20:43 -------------------------------------------------------------------------------- HPI Details Patient Name: Date of Service: [name], [name] 12/09/2021 10:45 A M Medical Record Number: [id number] Patient Account Number: [id number] Date of Birth/Sex: Treating RN: 1940/04/01 (81 y.o. F) Primary Care Provider: [name] Other Clinician: Referring Provider: Treating Provider/Extender: [name] Weeks in Treatment: 7 History of Present Illness HPI Description: 10-21-2021 upon evaluation today patient appears to be doing poorly in regard to the wound over the medial aspect of her right ankle. This is an area that previously she has had trouble with previously. I have actually seen her in [place] about a year ago but this was on the left ankle at that time. 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 This is also the typical spot for her as it is a very difficult area to compress. Nonetheless previously we have been able to get this under control using her compression socks as well as Xeroform gauze although I am not sure that is can be the best thing for her at this time we discussed that as we proceed. Patient does have chronic venous hypertension and a history of  vasculitis but otherwise no major medical problems. 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 Nonetheless I  do think that we could potentially try some oral steroids, prednisone, to see if we can help calm this down. She is not opposed to this at all. In fact she would like to do anything she can to try to get better she tells me the Achilles area has been very painful at times for her. I really do not see any signs of infection right now but that symptomatic = as well. 10/18; patient with wounds looks a lot better today measuring smaller. She went to her grandsons wedding this weekend did not wear compression wraps. She completed her course of prednisone 50 mg we have been using TCA and Hydrofera Blue on the wound area. 11-11-2021 upon evaluation today patient appears to be doing well currently in regard to her wound in fact she is showing signs of good granulation and epithelization at this point and I am very pleased with where we stand currently. I do not see any signs of infection locally or systemically which is great news. No fevers, chills, nausea, vomiting, or diarrhea. I do believe that the anti-inflammatories did do well for her. 11-18-2021 upon evaluation today patient's wound is actually showing signs of excellent improvement and very pleased with where we were seeing this improvement thus far. I do not see any evidence of active infection locally or systemically which is great news and overall I do believe we are on the right track here. 11/8; patient comes in complaining of marked pain in this area. She takes Tylenol for the discomfort. Unfortunately we are not making much progress in the condition of this wound still slough covered. She carries a diagnosis of livedoid vasculitis. She also has  severe chronic venous hypertension 12-02-2021 upon evaluation today patient appears to be doing well currently in regard to her wound although is not significantly smaller I do think the compression wrap is better than her compression sock based on what we are seeing currently. With that being said there does not appear to be any signs of infection which is good news but the wound does not appear to be quite as healthy as what I saw the last time I saw her in 2 weeks and looking back at pictures I really feel like that there were very small compared to where we started there was a little bit of discoloration of the base of the wound that was not there last week. Some of this was probably just due to bleeding which is understandable from and following debridement but at the same time I do believe that nonetheless she still would benefit from the compression wrapping currently. She is definitely more swollen today than she was previous and she realizes this as well. 12-09-2021 upon evaluation today patient appears to be doing a little better in regard to her wound compared to last time although size wise is not much different it does seem to be improving with regard to the necrotic tissue that is turning more yellow and loosening up which is at least good news. With that being said I do believe that the patient would benefit from likely switching to Santyl this is good to me and we cannot wrap her and she is getting need to actually change this more frequently at home using her compression sock but I think that it may be a better way to go at the moment to see if we can get this cleaned up she is in agreement with that plan. If we get to where we can use  collagen then we will consider going back to the wrap. Electronic Signature(s) Signed: 12/09/2021 1:49:33 PM By: [name] PA-C Entered By: [name] on 12/09/2021 13:49:33 Velasquez, Myles T ([id number])  [id number] Page 3 of 7 -------------------------------------------------------------------------------- Physical Exam Details Patient Name: Date of Service: MASHAWN, BRAZIL 12/09/2021 10:45 A M Medical Record Number: [id number] Patient Account Number: [id number] Date of Birth/Sex: Treating RN: 01-Oct-1940 (81 y.o. F) Primary Care Provider: [name] Other Clinician: Referring Provider: Treating Provider/Extender: [name] Weeks in Treatment: 7 Constitutional Well-nourished and well-hydrated in no acute distress. Respiratory normal breathing without difficulty. Psychiatric this patient is able to make decisions and demonstrates good insight into disease process. Alert and Oriented x 3. pleasant and cooperative. Notes Upon inspection patient's wound bed actually showed signs of good granulation epithelization at this point. Fortunately there does not appear to be any signs of infection locally or systemically which is great news and overall I am extremely pleased with where we stand today. I did perform some debridement to clearway some of the necrotic debris I was able to get this looking a little bit better but not quite as good as I would like I think going with Santyl will probably be the best way to go at this point. Electronic Signature(s) Signed: 12/09/2021 1:50:08 PM By: [name] PA-C Entered By: [name] on 12/09/2021 13:50:08 -------------------------------------------------------------------------------- Physician Orders Details Patient Name: Date of Service: [name], Layani T. 12/09/2021 10:45 A M Medical Record Number: [id number] Patient Account Number: [id number] Date of Birth/Sex: Treating RN: 03-05-1940 (81 y.o. 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 - w/ Jeri Cos, PA Wednesday Return A Anesthetic (In clinic) Topical Lidocaine 5% applied to wound bed Bathing/ Shower/ Hygiene May shower with protection but do not get wound dressing(s) wet. - may shower and use dial soap to wash wound, then reapply dressing Edema Control - Lymphedema / SCD / Other Elevate legs to the level of the heart or above for 30 minutes daily and/or when sitting, a frequency of: - 3-4 times a day throughout the day. Avoid standing for long periods of time. Patient to wear own compression stockings every day. - apply in the morning and remove at night Exercise regularly ZAINA, JENKIN T ([id number]) [id number] Page 4 of 7 Wound Treatment Wound #1 - Malleolus Wound Laterality: Right, Medial Cleanser: Soap and Water Every Other Day/15 Days Discharge Instructions: May shower and wash wound with dial antibacterial soap and water prior to dressing change. Cleanser: Wound Cleanser (Generic) Every Other Day/15 Days Discharge Instructions: Cleanse the wound with wound cleanser prior to applying a clean dressing using gauze sponges, not tissue or cotton balls. Peri-Wound Care: Skin Prep (Generic) Every Other Day/15 Days Discharge Instructions: Use skin prep as directed Peri-Wound Care: Triamcinolone 15 (g) Every Other Day/15 Days Discharge Instructions: Use triamcinolone 15 (g) as directed Peri-Wound Care: Sween Lotion (Moisturizing lotion) Every Other Day/15 Days Discharge Instructions: Apply moisturizing lotion as directed Prim Dressing: Santyl Ointment Every Other Day/15 Days ary Discharge Instructions:  Apply nickel thick amount to wound bed as instructed  Secondary Dressing: Woven Gauze Sponges 2x2 in (DME) (Generic) Every Other Day/15 Days Discharge Instructions: Apply over primary dressing as directed. Secondary Dressing: Zetuvit Plus Silicone Border Dressing 4x4 (in/in) (DME) (Generic) Every Other Day/15 Days Discharge Instructions: Apply silicone border over primary dressing as directed. Patient Medications llergies: No Known Allergies A Notifications Medication Indication Start End 12/09/2021 Santyl DOSE topical 250 unit/gram ointment - ointment topical Apply nickel thick daily to the wound bed and then cover with a dressing as directed in clinic x 30 days Electronic Signature(s) Signed: 12/09/2021 2:30:20 PM By: [name] PA-C Signed: 12/09/2021 3:58:15 PM By: Rhae Hammock RN Previous Signature: 12/09/2021 11:32:29 AM Version By: [name] PA-C Entered By: Rhae Hammock on 12/09/2021 12:21:25 -------------------------------------------------------------------------------- Problem List Details Patient Name: Date of Service: [name], Shreya T. 12/09/2021 10:45 A M Medical Record Number: [id number] Patient Account Number: [id number] Date of Birth/Sex: Treating RN: 26-Sep-1940 (81 y.o. 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 10/28/2021 No Yes I87.331 Chronic venous hypertension (idiopathic) with ulcer and inflammation of right 10/21/2021 No Yes lower extremity L97.312 Non-pressure chronic ulcer of right ankle with fat layer exposed 10/21/2021 No Yes Roadcap, Anushri T ([id number]) [id number] Page 5 of 7 Inactive Problems Resolved Problems Electronic Signature(s) Signed: 12/09/2021 10:29:09 AM By:  [name] PA-C Entered By: [name] on 12/09/2021 10:29:09 -------------------------------------------------------------------------------- Progress Note Details Patient Name: Date of Service: [name], Sameena T. 12/09/2021 10:45 A M Medical Record Number: [id number] Patient Account Number: [id number] Date of Birth/Sex: Treating RN: Oct 13, 1940 (81 y.o. F) Primary Care Provider: [name] Other Clinician: Referring Provider: Treating Provider/Extender: [name] Weeks in Treatment: 7 Subjective Chief Complaint Information obtained from Patient Right ankle ulcer History of Present Illness (HPI) 10-21-2021 upon evaluation today patient appears to be doing poorly in regard to the wound over the medial aspect of her right ankle. This is an area that previously she has had trouble with previously. I have actually seen her in [place] about a year ago but this was on the left ankle at that time. 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 This is also the typical spot for her as it is a very difficult area to compress. Nonetheless previously we have been able to get this under control using her compression socks as well as Xeroform gauze although I am not sure that is can be the best thing for her at this time we discussed that as we proceed. 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 Nonetheless I do think that we could potentially try some oral steroids, prednisone, to see if we can help calm this down. She is not opposed to this at all. In fact she would like to do anything she can to  try to get better she tells me the Achilles area has been very painful at times for her. I really do not see any signs of infection right  now but that symptomatic = as well. 10/18; patient with wounds looks a lot better today measuring smaller. She went to her grandsons wedding this weekend did not wear compression wraps. She completed her course of prednisone 50 mg we have been using TCA and Hydrofera Blue on the wound area. 11-11-2021 upon evaluation today patient appears to be doing well currently in regard to her wound in fact she is showing signs of good granulation and epithelization at this point and I am very pleased with where we stand currently. I do not see any signs of infection locally or systemically which is great news. No fevers, chills, nausea, vomiting, or diarrhea. I do believe that the anti-inflammatories did do well for her. 11-18-2021 upon evaluation today patient's wound is actually showing signs of excellent improvement and very pleased with where we were seeing this improvement thus far. I do not see any evidence of active infection locally or systemically which is great news and overall I do believe we are on the right track here. 11/8; patient comes in complaining of marked pain in this area. She takes Tylenol for the discomfort. Unfortunately we are not making much progress in the condition of this wound still slough covered. 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 With that being said there does not appear to be any signs of infection which is good news but the wound does not appear to be quite as healthy as what I saw the last time I saw her in 2 weeks and looking back at  pictures I really feel like that there were very small compared to where we started there was a little bit of discoloration of the base of the wound that was not there last week. Some of this was probably just due to bleeding which is understandable from and following debridement but at the same time I do believe that nonetheless she still would benefit from the compression wrapping currently. She is definitely more swollen today than she was previous and she realizes this as well. 12-09-2021 upon evaluation today patient appears to be doing a little better in regard to her wound compared to last time although size wise is not much different it does seem to be improving with regard to the necrotic tissue that is turning more yellow and loosening up which is at least good news. With that being said I do believe that the patient would benefit from likely switching to Santyl this is good to me and we cannot wrap her and she is getting need to actually change this more frequently at home using her compression sock but I think that it may be a better way to go at the moment to see if we can get this cleaned up she is in agreement with that plan. If we get to where we can use collagen then we will consider going back to the wrap. LILLIA, LENGEL ([id number]) [id number] Page 6 of 7 Objective Constitutional Well-nourished and well-hydrated in no acute distress. Vitals Time Taken: 11:00 AM, Temperature: 97.9 F, Pulse: 76 bpm, Respiratory Rate: 20 breaths/min, Blood Pressure: 183/75 mmHg. General Notes: patient stressed with three appointments this morning. Respiratory normal breathing without difficulty. Psychiatric this patient is able to  make decisions and demonstrates good insight into disease process. Alert and Oriented x 3. pleasant and cooperative. General Notes: Upon inspection patient's wound bed actually showed signs of good granulation epithelization at this  point. Fortunately there does not appear to be any signs of infection locally or systemically which is great news and overall I am extremely pleased with where we stand today. I did perform some debridement to clearway some of the necrotic debris I was able to get this looking a little bit better but not quite as good as I would like I think going with Santyl will probably be the best way to go at this point. Integumentary (Hair, Skin) Wound #1 status is Open. Original cause of wound was Gradually Appeared. The date acquired was: 09/30/2021. The wound has been in treatment 7 weeks. The wound is located on the Right,Medial Malleolus. The wound measures 1.5cm length x 0.4cm width x 0.2cm depth; 0.471cm^2 area and 0.094cm^3 volume. There is Fat Layer (Subcutaneous Tissue) exposed. There is no tunneling or undermining noted. There is a medium amount of serosanguineous drainage noted. The wound margin is distinct with the outline attached to the wound base. There is small (1-33%) red, pink granulation within the wound bed. There is a large (67- 100%) amount of necrotic tissue within the wound bed including Adherent Slough. The periwound skin appearance did not exhibit: Callus, Crepitus, Excoriation, Induration, Rash, Scarring, Dry/Scaly, Maceration, Atrophie Blanche, Cyanosis, Ecchymosis, Hemosiderin Staining, Mottled, Pallor, Rubor, Erythema. Periwound temperature was noted as No Abnormality. The periwound has tenderness on palpation. Assessment Active Problems ICD-10 Livedoid vasculitis Chronic venous hypertension (idiopathic) with ulcer and inflammation of right lower extremity Non-pressure chronic ulcer of right ankle with fat layer exposed Procedures Wound #1 Pre-procedure diagnosis of Wound #1 is a Venous Leg Ulcer located on the Right,Medial Malleolus .Severity of Tissue Pre Debridement is: Fat layer exposed. There was a Excisional Skin/Subcutaneous Tissue Debridement with a total area of 0.6  sq cm performed by [name], PA. With the following instrument(s): Curette to remove Viable and Non-Viable tissue/material. Material removed includes Subcutaneous Tissue and Slough and after achieving pain control using Lidocaine. No specimens were taken. A time out was conducted at 11:20, prior to the start of the procedure. A Minimum amount of bleeding was controlled with Pressure. The procedure was tolerated well with a pain level of 0 throughout and a pain level of 0 following the procedure. Post Debridement Measurements: 1.5cm length x 0.4cm width x 0.2cm depth; 0.094cm^3 volume. Character of Wound/Ulcer Post Debridement is improved. Severity of Tissue Post Debridement is: Fat layer exposed. Post procedure Diagnosis Wound #1: Same as Pre-Procedure Plan Follow-up Appointments: Return Appointment in 1 week. - w/ Jeri Cos, PA Wednesday Anesthetic: (In clinic) Topical Lidocaine 5% applied to wound bed Bathing/ Shower/ Hygiene: May shower with protection but do not get wound dressing(s) wet. - may shower and use dial soap to wash wound, then reapply dressing Edema Control - Lymphedema / SCD / Other: Elevate legs to the level of the heart or above for 30 minutes daily and/or when sitting, a frequency of: - 3-4 times a day throughout the day. Avoid standing for long periods of time. Patient to wear own compression stockings every day. - apply in the morning and remove at night Exercise regularly The following medication(s) was prescribed: Santyl topical 250 unit/gram ointment ointment topical Apply nickel thick daily to the wound bed and then cover with a dressing as directed in clinic x 30 days starting  12/09/2021 WOUND #1: - Malleolus Wound Laterality: Right, Medial Cleanser: Soap and Water Every Other Day/15 Days Discharge Instructions: May shower and wash wound with dial antibacterial soap and water prior to dressing change. Cleanser: Wound Cleanser (Generic) Every Other Day/15  Days Discharge Instructions: Cleanse the wound with wound cleanser prior to applying a clean dressing using gauze sponges, not tissue or cotton balls. LORRAINA, SPRING ([id number]) [id number] Page 7 of 7 Peri-Wound Care: Skin Prep (Generic) Every Other Day/15 Days Discharge Instructions: Use skin prep as directed Peri-Wound Care: Triamcinolone 15 (g) Every Other Day/15 Days Discharge Instructions: Use triamcinolone 15 (g) as directed Peri-Wound Care: Sween Lotion (Moisturizing lotion) Every Other Day/15 Days Discharge Instructions: Apply moisturizing lotion as directed Prim Dressing: Santyl Ointment Every Other Day/15 Days ary Discharge Instructions: Apply nickel thick amount to wound bed as instructed Secondary Dressing: Woven Gauze Sponges 2x2 in (DME) (Generic) Every Other Day/15 Days Discharge Instructions: Apply over primary dressing as directed. Secondary Dressing: Zetuvit Plus Silicone Border Dressing 4x4 (in/in) (DME) (Generic) Every Other Day/15 Days Discharge Instructions: Apply silicone border over primary dressing as directed. 1. I am going to recommend that we have the patient go ahead and continue to monitor for any signs of worsening or infection. Based on what I am seeing currently I do believe that the patient would benefit from switching to [facility] and I am going to send that into the pharmacy today. 2. I am also can recommend that we have the patient continue to monitor for any evidence of worsening or infection. Obviously if anything changes she can contact the office on my hope is however that this will continue to improve little by little. We will see patient back for reevaluation in 1 week here in the clinic. If anything worsens or changes patient will contact our office for additional recommendations. Electronic Signature(s) Signed: 12/09/2021 1:51:46 PM By: [name] PA-C Entered By: [name] on 12/09/2021  13:51:45 -------------------------------------------------------------------------------- SuperBill Details Patient Name: Date of Service: Thora Lance T. 12/09/2021 Medical Record Number: [id number] Patient Account Number: [id number] Date of Birth/Sex: Treating RN: Jan 21, 1940 (81 y.o. 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

## 2021-12-10 NOTE — Progress Notes (Signed)
JOANETTE, SILVERIA ([id number]) [id number] Page 1 of 6 Visit Report for 12/09/2021 Arrival Information Details Patient Name: Date of Service: JACQUELYNN, FRIEND 12/09/2021 10:45 A M Medical Record Number: [id number] Patient Account Number: [id number] Date of Birth/Sex: Treating RN: 07-23-1940 (81 y.o. [name], Meta.Reding Primary Care Jelisha Weed: [name] Other Clinician: Referring Yan Pankratz: Treating Amada Hallisey/Extender: [name] Weeks in Treatment: 7 Visit Information History Since Last Visit Added or deleted any medications: No Patient Arrived: Ambulatory Any new allergies or adverse reactions: No Arrival Time: 11:02 Had a fall or experienced change in No Accompanied By: husband activities of daily living that may affect Transfer Assistance: None risk of falls: Patient Identification Verified: Yes Signs or symptoms of abuse/neglect since last visito No Secondary Verification Process Completed: Yes Hospitalized since last visit: No Patient Requires Transmission-Based Precautions: No Implantable device outside of the clinic excluding No Patient Has Alerts: No cellular tissue based products placed in the center since last visit: Has Dressing in Place as Prescribed: Yes Has Compression in Place as Prescribed: Yes Pain Present Now: No Electronic Signature(s) Signed: 12/09/2021 4:43:23 PM By: Deon Pilling RN, BSN Entered By: Deon Pilling on 12/09/2021 11:03:11 -------------------------------------------------------------------------------- Encounter Discharge Information Details Patient Name: Date of Service: [name], Marica T. 12/09/2021 10:45 A M Medical Record Number: [id number] Patient Account Number: [id number] Date of Birth/Sex: Treating RN: 11/13/1940 (81 y.o. [name], [name] Primary Care Akiah Bauch: [name] Other Clinician: Referring Tonna Palazzi: Treating Liem Copenhaver/Extender: [name] Weeks in Treatment: 7 Encounter Discharge Information Items Post Procedure Vitals Discharge Condition: Stable Temperature (F): 98.7 Ambulatory Status: Ambulatory Pulse (bpm): 74 Discharge Destination: Home Respiratory Rate (breaths/min): 17 Transportation: Private Auto Blood Pressure (mmHg): 120/80 Accompanied By: husband Schedule Follow-up Appointment: Yes Clinical Summary of Care: Patient Declined Electronic Signature(s) Signed: 12/09/2021 3:58:15 PM By: Rhae Hammock RN Entered By: Rhae Hammock on 12/09/2021 11:30:51 Coletta, Arlissa T ([id number]) [id number] Page 2 of 6 -------------------------------------------------------------------------------- Lower Extremity Assessment Details Patient Name: Date of Service: MERNA, BALDI 12/09/2021 10:45 A M Medical Record Number: [id number] Patient Account Number: [id number] Date of Birth/Sex: Treating RN: 11-18-1940 (82 y.o. [name], Meta.Reding Primary Care Naidelin Gugliotta: [name] Other Clinician: Referring Demarea Lorey: Treating Orie Baxendale/Extender: [name] Weeks in Treatment: 7 Edema Assessment Assessed: [Left: No] [Right: Yes] Edema: [Left: Ye] [Right: s] Calf Left: Right: Point of Measurement: 37 cm From Medial Instep 35 cm Ankle Left: Right: Point of Measurement: 8 cm From Medial Instep 20 cm Vascular Assessment Pulses: Dorsalis Pedis Palpable: [Right:Yes] Electronic Signature(s) Signed: 12/09/2021 4:43:23 PM By: Deon Pilling RN, BSN Entered By: Deon Pilling on 12/09/2021 11:09:36 -------------------------------------------------------------------------------- [place] Details Patient Name: Date of Service: [name], October T. 12/09/2021 10:45 A M Medical Record Number: [id number] Patient Account Number: [id number] Date of Birth/Sex: Treating RN: [birth year] (81 y.o. [name], [name] Primary Care Shawnia Vizcarrondo: [name] Other Clinician: Referring Latonyia Lopata: Treating Firas Guardado/Extender: [name] Weeks in Treatment: 7 Active Inactive Wound/Skin Impairment Nursing Diagnoses: Impaired tissue integrity Knowledge deficit related to smoking impact on wound healing Goals: Patient will have a decrease in wound volume by X% from date: (specify in notes) Date Initiated: 10/21/2021 Target Resolution Date: 12/18/2021 Goal Status: Active Patient/caregiver will verbalize understanding of skin care regimen Date Initiated: 10/21/2021 Date Inactivated: 11/11/2021 Target Resolution Date: 11/21/2021 Goal Status: Met Ulcer/skin breakdown will have a volume reduction of 30% by week  [place] T ([id number]) [id number] Page 3 of 6 Date Initiated: 10/21/2021 Target Resolution Date: 12/19/2021 Goal Status: Active Interventions: Assess patient/caregiver ability to obtain necessary supplies Assess patient/caregiver ability to perform ulcer/skin care regimen upon admission and as needed Assess ulceration(s) every visit Notes: Electronic Signature(s) Signed: 12/09/2021 3:58:15 PM By: Rhae Hammock RN Entered By: Rhae Hammock on 12/09/2021 12:21:29 -------------------------------------------------------------------------------- Pain Assessment Details Patient Name: Date of Service: [name], Anahita T. 12/09/2021 10:45 A M Medical Record Number: [id number] Patient Account Number: [id number] Date of Birth/Sex: Treating RN: April 05, 1940 (81 y.o. [name], Tammi Klippel Primary Care Xavia Kniskern: [name] Other Clinician: Referring Rhylin Venters: Treating Garrette Caine/Extender: [name] Weeks in Treatment: 7 Active Problems Location of Pain Severity and Description of Pain Patient Has Paino No Site Locations Rate the pain. Current Pain Level: 0 Pain Management and Medication Current Pain Management: Medication: No Cold  Application: No Rest: No Massage: No Activity: No T.E.N.S.: No Heat Application: No Leg drop or elevation: No Is the Current Pain Management Adequate: Adequate How does your wound impact your activities of daily livingo Sleep: No Bathing: No Appetite: No Relationship With Others: No Bladder Continence: No Emotions: No Bowel Continence: No Work: No Toileting: No Drive: No Dressing: No Hobbies: No Electronic Signature(s) Signed: 12/09/2021 4:43:23 PM By: Deon Pilling RN, BSN Entered By: Deon Pilling on 12/09/2021 11:03:27 Azucena Fallen T ([id number]) [id number] Page 4 of 6 -------------------------------------------------------------------------------- Patient/Caregiver Education Details Patient Name: Date of Service: CLA YOSELINE, ANDERSSON 11/22/2023andnbsp10:45 A M Medical Record Number: [id number] Patient Account Number: [id number] Date of Birth/Gender: Treating RN: 27-May-1940 (81 y.o. [name], [name] Primary Care Physician: [name] Other Clinician: Referring Physician: Treating Physician/Extender: [name] Weeks in Treatment: 7 Education Assessment Education Provided To: Patient Education Topics Provided Wound/Skin Impairment: Methods: Explain/Verbal Responses: Reinforcements needed, State content correctly Electronic Signature(s) Signed: 12/09/2021 3:58:15 PM By: Rhae Hammock RN Entered By: Rhae Hammock on 12/09/2021 12:22:23 -------------------------------------------------------------------------------- Wound Assessment Details Patient Name: Date of Service: [name], Mirely T. 12/09/2021 10:45 A M Medical Record Number: [id number] Patient Account Number: [id number] Date of Birth/Sex: Treating RN: 03-25-1940 (81 y.o. [name], Tammi Klippel Primary Care Debar Plate: [name] Other Clinician: Referring Star Cheese: Treating Wallice Granville/Extender: [name] Weeks in Treatment: 7 Wound Status Wound Number: 1 Primary Etiology: Venous Leg Ulcer Wound Location: Right, Medial Malleolus Wound Status: Open Wounding Event: Gradually Appeared Comorbid History: Peripheral Venous Disease Date Acquired: 09/30/2021 Weeks Of Treatment: 7 Clustered Wound: Yes Photos Wound Measurements Kerin, Heaven T ([id number]) Length: (cm) 1.5 Width: (cm) 0.4 Depth: (cm) 0.2 Area: (cm) 0.471 Volume: (cm) 0.094 [id number] Page 5 of 6 % Reduction in Area: 73.3% % Reduction in Volume: 73.4% Epithelialization: Medium (34-66%) Tunneling: No Undermining: No Wound Description Classification: Full Thickness With Exposed Suppo Wound Margin: Distinct, outline attached Exudate Amount: Medium Exudate Type: Serosanguineous Exudate Color: red, brown rt Structures Foul Odor After Cleansing: No Slough/Fibrino Yes Wound Bed Granulation Amount: Small (1-33%) Exposed Structure Granulation Quality: Red, Pink Fascia Exposed: No Necrotic Amount: Large (67-100%) Fat Layer (Subcutaneous Tissue) Exposed: Yes Necrotic Quality: Adherent Slough Tendon Exposed: No Muscle Exposed: No Joint Exposed: No Bone Exposed: No Periwound Skin Texture Texture Color No Abnormalities Noted: No No Abnormalities Noted: No Callus: No Atrophie Blanche: No Crepitus: No Cyanosis: No Excoriation: No Ecchymosis: No Induration: No Erythema: No Rash: No Hemosiderin Staining: No Scarring: No Mottled: No Pallor: No Moisture Rubor: No No Abnormalities  Noted: No Dry / Scaly: No Temperature / Pain Maceration: No Temperature: No Abnormality Tenderness on Palpation: Yes Treatment Notes Wound #1 (Malleolus) Wound Laterality: Right, Medial Cleanser Soap and Water Discharge Instruction: May shower and wash wound with dial antibacterial soap and water prior to dressing change. Wound Cleanser Discharge Instruction: Cleanse the wound with wound cleanser  prior to applying a clean dressing using gauze sponges, not tissue or cotton balls. Peri-Wound Care Skin Prep Discharge Instruction: Use skin prep as directed Triamcinolone 15 (g) Discharge Instruction: Use triamcinolone 15 (g) as directed Sween Lotion (Moisturizing lotion) Discharge Instruction: Apply moisturizing lotion as directed Topical Primary Dressing Santyl Ointment Discharge Instruction: Apply nickel thick amount to wound bed as instructed Secondary Dressing Woven Gauze Sponge, Non-Sterile 4x4 in Discharge Instruction: Apply over primary dressing as directed. Zetuvit Plus Silicone Border Dressing 4x4 (in/in) Discharge Instruction: Apply silicone border over primary dressing as directed. Secured With Compression Wrap Compression Stockings MYEISHA, KRUSER T ([id number]) [id number] Page 6 of 6 Add-Ons Electronic Signature(s) Signed: 12/09/2021 4:43:23 PM By: Deon Pilling RN, BSN Signed: 12/09/2021 4:51:51 PM By: Erenest Blank Entered By: Erenest Blank on 12/09/2021 11:10:18 -------------------------------------------------------------------------------- Vitals Details Patient Name: Date of Service: [name], Rema T. 12/09/2021 10:45 A M Medical Record Number: [id number] Patient Account Number: [id number] Date of Birth/Sex: Treating RN: 1940/11/24 (81 y.o. [name], Tammi Klippel Primary Care Hollis Oh: [name] Other Clinician: Referring Kayceon Oki: Treating Bonne Whack/Extender: [name] Weeks in Treatment: 7 Vital Signs Time Taken: 11:00 Temperature (F): 97.9 Pulse (bpm): 76 Respiratory Rate (breaths/min): 20 Blood Pressure (mmHg): 183/75 Reference Range: 80 - 120 mg / dl Notes patient stressed with three appointments this morning. Electronic Signature(s) Signed: 12/09/2021 4:43:23 PM By: Deon Pilling RN, BSN Entered By: Deon Pilling on 12/09/2021 11:05:09

## 2021-12-11 DIAGNOSIS — S91301A Unspecified open wound, right foot, initial encounter: Secondary | ICD-10-CM | POA: Diagnosis not present

## 2021-12-15 DIAGNOSIS — S91301A Unspecified open wound, right foot, initial encounter: Secondary | ICD-10-CM | POA: Diagnosis not present

## 2021-12-16 ENCOUNTER — Encounter (HOSPITAL_BASED_OUTPATIENT_CLINIC_OR_DEPARTMENT_OTHER): Payer: Medicare Other | Admitting: Physician Assistant

## 2021-12-17 ENCOUNTER — Other Ambulatory Visit: Payer: Self-pay | Admitting: Family Medicine

## 2021-12-23 ENCOUNTER — Encounter (HOSPITAL_BASED_OUTPATIENT_CLINIC_OR_DEPARTMENT_OTHER): Payer: Medicare Other | Attending: Physician Assistant | Admitting: Physician Assistant

## 2021-12-23 DIAGNOSIS — I87331 Chronic venous hypertension (idiopathic) with ulcer and inflammation of right lower extremity: Secondary | ICD-10-CM | POA: Insufficient documentation

## 2021-12-23 DIAGNOSIS — L950 Livedoid vasculitis: Secondary | ICD-10-CM | POA: Diagnosis not present

## 2021-12-23 DIAGNOSIS — B965 Pseudomonas (aeruginosa) (mallei) (pseudomallei) as the cause of diseases classified elsewhere: Secondary | ICD-10-CM | POA: Insufficient documentation

## 2021-12-23 DIAGNOSIS — I872 Venous insufficiency (chronic) (peripheral): Secondary | ICD-10-CM | POA: Diagnosis not present

## 2021-12-23 DIAGNOSIS — L97312 Non-pressure chronic ulcer of right ankle with fat layer exposed: Secondary | ICD-10-CM | POA: Insufficient documentation

## 2021-12-23 NOTE — Progress Notes (Addendum)
PEARLENA, OW ([id number]) [id number] Page 1 of 7 Visit Report for 12/23/2021 Chief Complaint Document Details Patient Name: Date of Service: GEORGIANN, [name] 12/23/2021 10:45 A M Medical Record Number: [id number] Patient Account Number: [id number] Date of Birth/Sex: Treating RN: 11-Jul-1940 (81 y.o. F) Primary Care Provider: [name] Other Clinician: Referring Provider: Treating Provider/Extender: [name] Weeks in Treatment: 9 Information Obtained from: Patient Chief Complaint Right ankle ulcer Electronic Signature(s) Signed: 12/23/2021 10:47:56 AM By: [name] PA-C Entered By: [name] on 12/23/2021 10:47:56 -------------------------------------------------------------------------------- Debridement Details Patient Name: Date of Service: [name], Sharaine T. 12/23/2021 10:45 A M Medical Record Number: [id number] Patient Account Number: [id number] Date of Birth/Sex: Treating RN: April 06, 1940 (81 y.o. [name], [name] Primary Care Provider: [name] Other Clinician: Referring Provider: Treating Provider/Extender: [name] Weeks in Treatment: 9 Debridement Performed for Assessment: Wound #1 Right,Medial Malleolus Performed By: Physician [name], PA Debridement Type: Debridement Severity of Tissue Pre Debridement: Fat layer exposed Level of Consciousness (Pre-procedure): Awake and Alert Pre-procedure Verification/Time Out Yes - 11:05 Taken: Start Time: 11:05 Pain Control: Lidocaine T Area Debrided (L x W): otal 0.5 (cm) x 0.4 (cm) = 0.2 (cm) Tissue and other material debrided: Viable, Non-Viable, Slough, Subcutaneous, Slough Level: Skin/Subcutaneous Tissue Debridement Description: Excisional Instrument: Curette Bleeding: Minimum Hemostasis Achieved: Pressure End Time: 11:05 Procedural Pain: 0 Post Procedural Pain: 0 Response to Treatment:  Procedure was tolerated well Level of Consciousness (Post- Awake and Alert procedure): Post Debridement Measurements of Total Wound Length: (cm) 0.5 Width: (cm) 0.4 Depth: (cm) 0.2 Volume: (cm) 0.031 Character of Wound/Ulcer Post Debridement: Improved Severity of Tissue Post Debridement: Fat layer exposed Hamill, Adrean T ([id number]) [id number] Page 2 of 7 Post Procedure Diagnosis Same as Pre-procedure Electronic Signature(s) Signed: 12/23/2021 4:11:00 PM By: [name] PA-C Signed: 01/20/2022 5:35:40 PM By: Rhae Hammock RN Entered By: Rhae Hammock on 12/23/2021 11:06:26 -------------------------------------------------------------------------------- HPI Details Patient Name: Date of Service: [name], Elisama T. 12/23/2021 10:45 A M Medical Record Number: [id number] Patient Account Number: [id number] Date of Birth/Sex: Treating RN: 01/22/1940 (81 y.o. F) Primary Care Provider: [name] Other Clinician: Referring Provider: Treating Provider/Extender: [name] Weeks in Treatment: 9 History of Present Illness HPI Description: 10-21-2021 upon evaluation today patient appears to be doing poorly in regard to the wound over the medial aspect of her right ankle. This is an area that previously she has had trouble with previously. I have actually seen her in [place] about a year ago but this was on the left ankle at that time. 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 This is also the typical spot for her as it is a very difficult area to compress. Nonetheless previously we have been able to get this under control using her compression socks as well as Xeroform gauze although I am not sure that is can be the best thing for her at this time we discussed that as we proceed. 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 Nonetheless I  do think that we could potentially try some oral steroids, prednisone, to see if we can help calm this down. She is not opposed to this at all. In fact she would like to do anything she can to try to get better she tells me the Achilles area has been very painful at times for her. I really do not see any signs of infection right now but that symptomatic = as well. 10/18; patient with wounds looks a lot better today measuring smaller. She went to her grandsons wedding this weekend did not wear compression wraps. She completed her course of prednisone 50 mg we have been using TCA and Hydrofera Blue on the wound area. 11-11-2021 upon evaluation today patient appears to be doing well currently in regard to her wound in fact she is showing signs of good granulation and epithelization at this point and I am very pleased with where we stand currently. I do not see any signs of infection locally or systemically which is great news. No fevers, chills, nausea, vomiting, or diarrhea. I do believe that the anti-inflammatories did do well for her. 11-18-2021 upon evaluation today patient's wound is actually showing signs of excellent improvement and very pleased with where we were seeing this improvement thus far. I do not see any evidence of active infection locally or systemically which is great news and overall I do believe we are on the right track here. 11/8; patient comes in complaining of marked pain in this area. She takes Tylenol for the discomfort. Unfortunately we are not making much progress in the condition of this wound still slough covered. 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 With that being said there does not appear to be any signs of infection which is good news but the wound does not appear to be quite as healthy as what I saw the last time I saw her in 2 weeks and looking back at pictures I really feel like that there were very small compared to where we started there was a little bit of discoloration of the base of the wound that was not there last week. Some of this was probably just due to bleeding which is understandable from and following debridement but at the same time I do believe that nonetheless she still would benefit from the compression wrapping currently. She is definitely more swollen today than she was previous and she realizes this as well. 12-09-2021 upon evaluation today patient appears to be doing a little better in regard to her wound compared to last time although size wise is not much different it does seem to be improving with regard to the necrotic tissue that is turning more yellow and loosening up which is at least good news. With that being said I do believe that the patient would benefit from likely switching to Santyl this is good to me and we cannot wrap her and she is getting need to actually change this more frequently at home using her compression sock but I think that it may be a better way to go at the moment to see if we can get this cleaned up she is in agreement with that plan. If we get to where we can use  collagen then we will consider going back to the wrap. 12-23-2021 upon evaluation today patient's wound does have some necrotic tissue noted at this point and we are going to need and clean this away today. I discussed that with her. With that being said fortunately  there does not appear to be any signs of infection locally or systemically which is great news and overall I am extremely pleased in that regard. Her husband is changing this daily and that does seem to be benefiting her at this point. The wound is a bit smaller today compared to previous evaluation. Electronic Signature(s) Signed: 12/23/2021 11:13:14 AM By: [name] PA-C Entered By: [name] on 12/23/2021 11:13:14 Kennerson, Lottie T ([id number]) [id number] Page 3 of 7 -------------------------------------------------------------------------------- Physical Exam Details Patient Name: Date of Service: GISELE, PACK 12/23/2021 10:45 A M Medical Record Number: [id number] Patient Account Number: [id number] Date of Birth/Sex: Treating RN: 1940/05/02 (81 y.o. F) Primary Care Provider: [name] Other Clinician: Referring Provider: Treating Provider/Extender: [name] Weeks in Treatment: 9 Constitutional Well-nourished and well-hydrated in no acute distress. Respiratory normal breathing without difficulty. Psychiatric this patient is able to make decisions and demonstrates good insight into disease process. Alert and Oriented x 3. pleasant and cooperative. Notes Upon inspection patient's wound bed actually showed signs of good granulation and epithelization at this point. Fortunately I do not see any evidence of active infection locally or systemically which is great news and overall I am extremely pleased with where we stand currently. I did perform debridement of clearway some of the slough and biofilm on the surface of the wound she tolerated that today without complication postdebridement this visit appears to be doing much better. Electronic Signature(s) Signed: 12/23/2021 11:13:38 AM By: [name] PA-C Entered By: [name] on 12/23/2021  11:13:38 -------------------------------------------------------------------------------- Physician Orders Details Patient Name: Date of Service: [name], Declyn T. 12/23/2021 10:45 A M Medical Record Number: [id number] Patient Account Number: [id number] Date of Birth/Sex: Treating RN: Feb 10, 1940 (81 y.o. [name], [name] Primary Care Provider: [name] Other Clinician: Referring Provider: Treating Provider/Extender: [name] Weeks in Treatment: 9 Verbal / Phone Orders: No Diagnosis Coding ICD-10 Coding Code Description L95.0 Livedoid vasculitis I87.331 Chronic venous hypertension (idiopathic) with ulcer and inflammation of right lower extremity L97.312 Non-pressure chronic ulcer of right ankle with fat layer exposed Follow-up Appointments ppointment in 2 weeks. [facility] on Wednesday's Return A Anesthetic (In clinic) Topical Lidocaine 5% applied to wound bed Bathing/ Shower/ Hygiene May shower with protection but do not get wound dressing(s) wet. - may shower and use dial soap to wash wound, then reapply dressing Edema Control - Lymphedema / SCD / Other Elevate legs to the level of the heart or above for 30 minutes daily and/or when sitting, a frequency of: - 3-4 times a day throughout the day. Avoid standing for long periods of time. Patient to wear own compression stockings every day. - apply in the morning and remove at night Exercise regularly EMME, ROSENAU T ([id number]) [id number] Page 4 of 7 Wound Treatment Wound #1 - Malleolus Wound Laterality: Right, Medial Cleanser: Normal Saline (DME) (Generic) 1 x Per Day/30 Days Discharge Instructions: Cleanse the wound with Normal Saline prior to applying a clean dressing using gauze sponges, not tissue or cotton balls. Cleanser: Soap and Water 1 x Per Day/30 Days Discharge Instructions: May shower and wash wound with dial antibacterial soap  and water prior to  dressing change. Peri-Wound Care: Skin Prep (DME) (Generic) 1 x Per Day/30 Days Discharge Instructions: Use skin prep as directed Peri-Wound Care: Triamcinolone 15 (g) 1 x Per Day/30 Days Discharge Instructions: Use triamcinolone 15 (g) to periwound and ON WOUND BED!! Peri-Wound Care: Sween Lotion (Moisturizing lotion) 1 x Per Day/30 Days Discharge Instructions: Apply moisturizing lotion as directed Secondary Dressing: Woven Gauze Sponges 2x2 in (DME) (Generic) 1 x Per Day/30 Days Discharge Instructions: Apply over primary dressing as directed. Secondary Dressing: Zetuvit Plus Silicone Border Dressing 4x4 (in/in) (DME) (Generic) 1 x Per Day/30 Days Discharge Instructions: Apply silicone border over primary dressing as directed. Electronic Signature(s) Signed: 12/23/2021 4:11:00 PM By: [name] PA-C Signed: 01/20/2022 5:35:40 PM By: Rhae Hammock RN Entered By: Rhae Hammock on 12/23/2021 11:09:32 -------------------------------------------------------------------------------- Problem List Details Patient Name: Date of Service: [name], Shalie T. 12/23/2021 10:45 A M Medical Record Number: [id number] Patient Account Number: [id number] Date of Birth/Sex: Treating RN: 05-22-1940 (81 y.o. F) Primary Care Provider: [name] Other Clinician: Referring Provider: Treating Provider/Extender: [name] Weeks in Treatment: 9 Active Problems ICD-10 Encounter Code Description Active Date MDM Diagnosis L95.0 Livedoid vasculitis 10/28/2021 No Yes I87.331 Chronic venous hypertension (idiopathic) with ulcer and inflammation of right 10/21/2021 No Yes lower extremity L97.312 Non-pressure chronic ulcer of right ankle with fat layer exposed 10/21/2021 No Yes Inactive Problems Resolved Problems Electronic Signature(s) Signed: 12/23/2021 10:47:50 AM By: [name] PA-C Entered By: [name] on 12/23/2021 10:47:50 Rossel, Tyreanna T ([id number])  [id number] Page 5 of 7 -------------------------------------------------------------------------------- Progress Note Details Patient Name: Date of Service: NUMA, HEATWOLE 12/23/2021 10:45 A M Medical Record Number: [id number] Patient Account Number: [id number] Date of Birth/Sex: Treating RN: 07/20/1940 (81 y.o. F) Primary Care Provider: [name] Other Clinician: Referring Provider: Treating Provider/Extender: [name] Weeks in Treatment: 9 Subjective Chief Complaint Information obtained from Patient Right ankle ulcer History of Present Illness (HPI) 10-21-2021 upon evaluation today patient appears to be doing poorly in regard to the wound over the medial aspect of her right ankle. This is an area that previously she has had trouble with previously. I have actually seen her in [place] about a year ago but this was on the left ankle at that time. 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 This is also the typical spot for her as it is a very difficult area to compress. Nonetheless previously we have been able to get this under control using her compression socks as well as Xeroform gauze although I am not sure that is can be the best thing for her at this time we discussed that as we proceed. 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 Nonetheless I do think that we could potentially try some oral steroids, prednisone, to see if we can help calm this down. She is not opposed to this at all. In fact she would like to do anything she can to try to get better she  tells me the Achilles area has been very painful at  times for her. I really do not see any signs of infection right now but that symptomatic = as well. 10/18; patient with wounds looks a lot better today measuring smaller. She went to her grandsons wedding this weekend did not wear compression wraps. She completed her course of prednisone 50 mg we have been using TCA and Hydrofera Blue on the wound area. 11-11-2021 upon evaluation today patient appears to be doing well currently in regard to her wound in fact she is showing signs of good granulation and epithelization at this point and I am very pleased with where we stand currently. I do not see any signs of infection locally or systemically which is great news. No fevers, chills, nausea, vomiting, or diarrhea. I do believe that the anti-inflammatories did do well for her. 11-18-2021 upon evaluation today patient's wound is actually showing signs of excellent improvement and very pleased with where we were seeing this improvement thus far. I do not see any evidence of active infection locally or systemically which is great news and overall I do believe we are on the right track here. 11/8; patient comes in complaining of marked pain in this area. She takes Tylenol for the discomfort. Unfortunately we are not making much progress in the condition of this wound still slough covered. 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 With that being said there does not appear to be any signs of infection which is good news but the wound does not appear to be quite as healthy as what I saw the last time I saw her in 2 weeks and looking back at pictures I really feel like  that there were very small compared to where we started there was a little bit of discoloration of the base of the wound that was not there last week. Some of this was probably just due to bleeding which is understandable from and following debridement but at the same time I do believe that nonetheless she still would benefit from the compression wrapping currently. She is definitely more swollen today than she was previous and she realizes this as well. 12-09-2021 upon evaluation today patient appears to be doing a little better in regard to her wound compared to last time although size wise is not much different it does seem to be improving with regard to the necrotic tissue that is turning more yellow and loosening up which is at least good news. With that being said I do believe that the patient would benefit from likely switching to Santyl this is good to me and we cannot wrap her and she is getting need to actually change this more frequently at home using her compression sock but I think that it may be a better way to go at the moment to see if we can get this cleaned up she is in agreement with that plan. If we get to where we can use collagen then we will consider going back to the wrap. 12-23-2021 upon evaluation today patient's wound does have some necrotic tissue noted at this point and we are going to need and clean this away today. I discussed that with her. With that being said fortunately there does not appear to be any signs  of infection locally or systemically which is great news and overall I am extremely pleased in that regard. Her husband is changing this daily and that does seem to be benefiting her at this point. The wound is a bit smaller today compared to previous evaluation. Objective Constitutional Well-nourished and well-hydrated in no acute distress. Vitals Time Taken: 10:45 AM, Temperature: 97.9 F, Pulse: 83 bpm, Respiratory Rate: 18 breaths/min, Blood Pressure: 159/84  mmHg. KAETLIN, BULLEN ([id number]) [id number] Page 6 of 7 Respiratory normal breathing without difficulty. Psychiatric this patient is able to make decisions and demonstrates good insight into disease process. Alert and Oriented x 3. pleasant and cooperative. General Notes: Upon inspection patient's wound bed actually showed signs of good granulation and epithelization at this point. Fortunately I do not see any evidence of active infection locally or systemically which is great news and overall I am extremely pleased with where we stand currently. I did perform debridement of clearway some of the slough and biofilm on the surface of the wound she tolerated that today without complication postdebridement this visit appears to be doing much better. Integumentary (Hair, Skin) Wound #1 status is Open. Original cause of wound was Gradually Appeared. The date acquired was: 09/30/2021. The wound has been in treatment 9 weeks. The wound is located on the Right,Medial Malleolus. The wound measures 0.5cm length x 0.4cm width x 0.2cm depth; 0.157cm^2 area and 0.031cm^3 volume. There is Fat Layer (Subcutaneous Tissue) exposed. There is no tunneling or undermining noted. There is a medium amount of serosanguineous drainage noted. The wound margin is distinct with the outline attached to the wound base. There is small (1-33%) red, pink granulation within the wound bed. There is a large (67- 100%) amount of necrotic tissue within the wound bed including Adherent Slough. The periwound skin appearance did not exhibit: Callus, Crepitus, Excoriation, Induration, Rash, Scarring, Dry/Scaly, Maceration, Atrophie Blanche, Cyanosis, Ecchymosis, Hemosiderin Staining, Mottled, Pallor, Rubor, Erythema. Periwound temperature was noted as No Abnormality. The periwound has tenderness on palpation. Assessment Active Problems ICD-10 Livedoid vasculitis Chronic venous hypertension (idiopathic)  with ulcer and inflammation of right lower extremity Non-pressure chronic ulcer of right ankle with fat layer exposed Procedures Wound #1 Pre-procedure diagnosis of Wound #1 is a Venous Leg Ulcer located on the Right,Medial Malleolus .Severity of Tissue Pre Debridement is: Fat layer exposed. There was a Excisional Skin/Subcutaneous Tissue Debridement with a total area of 0.2 sq cm performed by [name], PA. With the following instrument(s): Curette to remove Viable and Non-Viable tissue/material. Material removed includes Subcutaneous Tissue and Slough and after achieving pain control using Lidocaine. No specimens were taken. A time out was conducted at 11:05, prior to the start of the procedure. A Minimum amount of bleeding was controlled with Pressure. The procedure was tolerated well with a pain level of 0 throughout and a pain level of 0 following the procedure. Post Debridement Measurements: 0.5cm length x 0.4cm width x 0.2cm depth; 0.031cm^3 volume. Character of Wound/Ulcer Post Debridement is improved. Severity of Tissue Post Debridement is: Fat layer exposed. Post procedure Diagnosis Wound #1: Same as Pre-Procedure Plan Follow-up Appointments: Return Appointment in 2 weeks. [facility] on Wednesday's Anesthetic: (In clinic) Topical Lidocaine 5% applied to wound bed Bathing/ Shower/ Hygiene: May shower with protection but do not get wound dressing(s) wet. - may shower and use dial soap to wash wound, then reapply dressing Edema Control - Lymphedema / SCD / Other: Elevate legs to the level of the heart or above  for 30 minutes daily and/or when sitting, a frequency of: - 3-4 times a day throughout the day. Avoid standing for long periods of time. Patient to wear own compression stockings every day. - apply in the morning and remove at night Exercise regularly WOUND #1: - Malleolus Wound Laterality: Right, Medial Cleanser: Normal Saline (DME) (Generic) 1 x Per Day/30 Days Discharge  Instructions: Cleanse the wound with Normal Saline prior to applying a clean dressing using gauze sponges, not tissue or cotton balls. Cleanser: Soap and Water 1 x Per Day/30 Days Discharge Instructions: May shower and wash wound with dial antibacterial soap and water prior to dressing change. Peri-Wound Care: Skin Prep (DME) (Generic) 1 x Per Day/30 Days Discharge Instructions: Use skin prep as directed Peri-Wound Care: Triamcinolone 15 (g) 1 x Per Day/30 Days Discharge Instructions: Use triamcinolone 15 (g) to periwound and ON WOUND BED!! Peri-Wound Care: Sween Lotion (Moisturizing lotion) 1 x Per Day/30 Days Discharge Instructions: Apply moisturizing lotion as directed Secondary Dressing: Woven Gauze Sponges 2x2 in (DME) (Generic) 1 x Per Day/30 Days Discharge Instructions: Apply over primary dressing as directed. Secondary Dressing: Zetuvit Plus Silicone Border Dressing 4x4 (in/in) (DME) (Generic) 1 x Per Day/30 Days Discharge Instructions: Apply silicone border over primary dressing as directed. 1. I am going to recommend that we have the patient continue to monitor for any evidence of infection or worsening. Obviously if anything changes she can CONOR, FILSAIME T ([id number]) [id number] Page 7 of 7 contact the office let me know. 2. I am also can recommend the patient should continue with the triamcinolone topically followed by the gauze and then subsequently they are using the Zetuvit bordered foam dressing to cover. 3. She continues to wear compression sock and that does seem to be doing decently well keeping her edema under good control. We will see patient back for reevaluation in 1 week here in the clinic. If anything worsens or changes patient will contact our office for additional recommendations. Electronic Signature(s) Signed: 12/23/2021 11:14:13 AM By: [name] PA-C Entered By: [name] on 12/23/2021  11:14:13 -------------------------------------------------------------------------------- SuperBill Details Patient Name: Date of Service: Thora Lance T. 12/23/2021 Medical Record Number: [id number] Patient Account Number: [id number] Date of Birth/Sex: Treating RN: [birth year] (81 y.o. 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

## 2021-12-24 DIAGNOSIS — S91301A Unspecified open wound, right foot, initial encounter: Secondary | ICD-10-CM | POA: Diagnosis not present

## 2021-12-28 DIAGNOSIS — H353221 Exudative age-related macular degeneration, left eye, with active choroidal neovascularization: Secondary | ICD-10-CM | POA: Diagnosis not present

## 2022-01-03 ENCOUNTER — Other Ambulatory Visit: Payer: Self-pay | Admitting: Family Medicine

## 2022-01-06 ENCOUNTER — Encounter (HOSPITAL_BASED_OUTPATIENT_CLINIC_OR_DEPARTMENT_OTHER): Payer: Medicare Other | Admitting: Physician Assistant

## 2022-01-06 DIAGNOSIS — I87331 Chronic venous hypertension (idiopathic) with ulcer and inflammation of right lower extremity: Secondary | ICD-10-CM | POA: Diagnosis not present

## 2022-01-06 DIAGNOSIS — L950 Livedoid vasculitis: Secondary | ICD-10-CM | POA: Diagnosis not present

## 2022-01-06 DIAGNOSIS — L97312 Non-pressure chronic ulcer of right ankle with fat layer exposed: Secondary | ICD-10-CM | POA: Diagnosis not present

## 2022-01-06 DIAGNOSIS — B965 Pseudomonas (aeruginosa) (mallei) (pseudomallei) as the cause of diseases classified elsewhere: Secondary | ICD-10-CM | POA: Diagnosis not present

## 2022-01-06 DIAGNOSIS — L080 Pyoderma: Secondary | ICD-10-CM | POA: Diagnosis not present

## 2022-01-06 NOTE — Progress Notes (Signed)
ADLYNN, LOWENSTEIN ([id number]) [id number] Page 1 of 7 Visit Report for 01/06/2022 Chief Complaint Document Details Patient Name: Date of Service: [name], [name] 01/06/2022 9:15 A M Medical Record Number: [id number] Patient Account Number: [id number] Date of Birth/Sex: Treating RN: 11-Mar-1940 (81 y.o. F) Primary Care Provider: [name] Other Clinician: Referring Provider: Treating Provider/Extender: [name] Weeks in Treatment: 11 Information Obtained from: Patient Chief Complaint Right ankle ulcer Electronic Signature(s) Signed: 01/06/2022 10:19:51 AM By: [name] PA-C Entered By: [name] on 01/06/2022 10:19:51 -------------------------------------------------------------------------------- HPI Details Patient Name: Date of Service: [name], [name]. 01/06/2022 9:15 A M Medical Record Number: [id number] Patient Account Number: [id number] Date of Birth/Sex: Treating RN: 25-Apr-1940 (81 y.o. F) Primary Care Provider: [name] Other Clinician: Referring Provider: Treating Provider/Extender: [name] Weeks in Treatment: 11 History of Present Illness HPI Description: 10-21-2021 upon evaluation today patient appears to be doing poorly in regard to the wound over the medial aspect of her right ankle. This is an area that previously she has had trouble with previously. I have actually seen her in [place] about a year ago but this was on the left ankle at that time. 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 This is also the typical spot for her as it is a very difficult area to compress. Nonetheless previously we have been able to get this under control using her compression socks as well as Xeroform gauze although I am not sure that is can be the best thing for her at this time we discussed that as we  proceed. 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 Nonetheless I do think that we could potentially try some oral steroids, prednisone, to see if we can help calm this down. She is not opposed to this at all. In fact she would like to do anything she can to try to get better she tells me the Achilles area has been very painful at times for her. I really do not see any signs of infection right now but that symptomatic = as well. 10/18; patient with wounds looks a lot better today measuring smaller. She went to her grandsons wedding this weekend did not wear compression wraps. She completed her course of prednisone 50 mg we have been using TCA and Hydrofera Blue on the wound area. 11-11-2021 upon evaluation today patient appears to be doing well currently in regard to her wound in fact she is showing signs of good granulation and epithelization at this point and I am very pleased with where we stand currently. I do not see any signs of infection locally or systemically which is great news. No fevers, chills, nausea, vomiting, or diarrhea. I do believe that the anti-inflammatories did do well for her. 11-18-2021 upon evaluation today patient's wound is actually showing signs of excellent improvement and very pleased with where we were seeing this improvement thus far. I do not see any evidence of active infection locally or systemically which is great news and overall I do believe we are on the  right track here. 11/8; patient comes in complaining of marked pain in this area. She takes Tylenol for the discomfort. Unfortunately we are not making much progress in the condition of this wound still slough  covered. 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 With that being said there does not appear to be any signs of infection which is good news but the wound does not appear to be quite as healthy as what I saw the last time I saw her in 2 weeks and looking back at pictures I really feel like that there were very small compared to where we started there was a little bit of discoloration of the base of the wound that was not there last [name], [name] ([id number]) [id number] Page 2 of 7 week. Some of this was probably just due to bleeding which is understandable from and following debridement but at the same time I do believe that nonetheless she still would benefit from the compression wrapping currently. She is definitely more swollen today than she was previous and she realizes this as well. 12-09-2021 upon evaluation today patient appears to be doing a little better in regard to her wound compared to last time although size wise is not much different it does seem to be improving with regard to the necrotic tissue that is turning more yellow and loosening up which is at least good news. With that being said I do believe that the patient would benefit from likely switching to Santyl this is good to me and we cannot wrap her and she is getting need to actually change this more frequently at home using her compression sock but I think that it may be a better way to go at the moment to see if we can get this cleaned up she is in agreement with that plan. If we get to where we can use collagen then we will consider going back to the wrap. 12-23-2021 upon evaluation today patient's wound  does have some necrotic tissue noted at this point and we are going to need and clean this away today. I discussed that with her. With that being said fortunately there does not appear to be any signs of infection locally or systemically which is great news and overall I am extremely pleased in that regard. Her husband is changing this daily and that does seem to be benefiting her at this point. The wound is a bit smaller today compared to previous evaluation. 01-06-2022 upon evaluation today patient unfortunately appears to be doing worse in regard to her wound. Actually got a call from her yesterday in [place] because she told me that her foot and ankle area was doing much worse with increased pain. Fortunately I do not see any signs of infection systemically though locally I do's definitely see signs of cellulitis. I discussed that with her today. Nonetheless I do believe that she would benefit from continuation of the antibiotics which I placed her on yesterday. This includes the Bactrim DS although we may need to add something different depend on what the results of the culture come back showing. We are going to do a PCR culture today. Subsequently I am also going to go ahead and give the patient  gentamicin cream which I am going to send into the pharmacy for her at this point. Electronic Signature(s) Signed: 01/06/2022 3:17:25 PM By: [name] PA-C Entered By: [name] on 01/06/2022 15:17:25 -------------------------------------------------------------------------------- Physical Exam Details Patient Name: Date of Service: [name], [name] 01/06/2022 9:15 A M Medical Record Number: [id number] Patient Account Number: [id number] Date of Birth/Sex: Treating RN: [birth year] (81 y.o. F) Primary Care Provider: [name] Other Clinician: Referring Provider: Treating Provider/Extender: [name] Weeks in Treatment:  68 Constitutional Well-nourished and well-hydrated in no acute distress. Respiratory normal breathing without difficulty. Psychiatric this patient is able to make decisions and demonstrates good insight into disease process. Alert and Oriented x 3. pleasant and cooperative. Notes Upon inspection patient's wound bed actually showed signs of poor granulation at this point she has more necrotic tissue unfortunately. With that being said I did discuss with the patient that I feel like she is going to likely need something in order to clean this up but right now more interested and get the infection under control. Electronic Signature(s) Signed: 01/06/2022 3:17:51 PM By: [name] PA-C Entered By: [name] on 01/06/2022 15:17:51 -------------------------------------------------------------------------------- Physician Orders Details Patient Name: Date of Service: [name], [name]. 01/06/2022 9:15 A M Medical Record Number: [id number] Patient Account Number: [id number] Date of Birth/Sex: Treating RN: 13-Jun-1940 (81 y.o. [name], [name] Primary Care Provider: [name] Other Clinician: Referring Provider: Treating Provider/Extender: [name] Weeks in Treatment: [address] Nadirah [name] ([id number]) [id number] Page 3 of 7 Verbal / Phone Orders: No Diagnosis Coding Follow-up Appointments ppointment in 1 week. - w/ Dr. Celine Ahr Return A ppointment in 2 weeks. [facility] Blacksmith on Wednesday's Return A Other: - If redness spreads outside of marked lines, go to ED!!! Pick up gentamicin antibiotic from your pharmacy!!!:) Anesthetic (In clinic) Topical Lidocaine 5% applied to wound bed Bathing/ Shower/ Hygiene May shower with protection but do not get wound dressing(s) wet. - may shower and use dial soap to wash wound, then reapply dressing Edema Control - Lymphedema / SCD / Other Elevate legs to the level of the heart or  above for 30 minutes daily and/or when sitting, a frequency of: - 3-4 times a day throughout the day. Avoid standing for long periods of time. Patient to wear own compression stockings every day. - apply in the morning and remove at night Exercise regularly Wound Treatment Wound #1 - Malleolus Wound Laterality: Right, Medial Cleanser: Normal Saline (Generic) 1 x Per Day/30 Days Discharge Instructions: Cleanse the wound with Normal Saline prior to applying a clean dressing using gauze sponges, not tissue or cotton balls. Cleanser: Soap and Water 1 x Per Day/30 Days Discharge Instructions: May shower and wash wound with dial antibacterial soap and water prior to dressing change. Peri-Wound Care: Skin Prep (Generic) 1 x Per Day/30 Days Discharge Instructions: Use skin prep as directed Topical: Gentamicin 1 x Per Day/30 Days Discharge Instructions: As directed by physician Secondary Dressing: Woven Gauze Sponges 2x2 in (Generic) 1 x Per Day/30 Days Discharge Instructions: Apply over primary dressing as directed. Secondary Dressing: Zetuvit Plus Silicone Border Dressing 4x4 (in/in) (Generic) 1 x Per Day/30 Days Discharge Instructions: Apply silicone border over primary dressing as directed. Patient Medications llergies: No Known Allergies A Notifications Medication Indication Start End 01/06/2022 gentamicin DOSE topical 0.1 % ointment - ointment topical once daily applied in a thin film to the wound bed  with each dressing change x 30 days Electronic Signature(s) Signed: 01/06/2022 10:57:37 AM By: [name] PA-C Entered By: [name] on 01/06/2022 10:57:36 -------------------------------------------------------------------------------- Problem List Details Patient Name: Date of Service: [name], [name]. 01/06/2022 9:15 A M Medical Record Number: [id number] Patient Account Number: [id number] Date of Birth/Sex: Treating RN: 11-29-1940 (81 y.o. F) Primary Care Provider:  [name] Other Clinician: Referring Provider: Treating Provider/Extender: [name] Weeks in Treatment: [address] ILEAH, FALKENSTEIN [name] ([id number]) [id number] Page 4 of 7 ICD-10 Encounter Code Description Active Date MDM Diagnosis L95.0 Livedoid vasculitis 10/28/2021 No Yes I87.331 Chronic venous hypertension (idiopathic) with ulcer and inflammation of right 10/21/2021 No Yes lower extremity L97.312 Non-pressure chronic ulcer of right ankle with fat layer exposed 10/21/2021 No Yes Inactive Problems Resolved Problems Electronic Signature(s) Signed: 01/06/2022 10:19:31 AM By: [name] PA-C Entered By: [name] on 01/06/2022 10:19:30 -------------------------------------------------------------------------------- Progress Note Details Patient Name: Date of Service: [name], [name]. 01/06/2022 9:15 A M Medical Record Number: [id number] Patient Account Number: [id number] Date of Birth/Sex: Treating RN: 1940-02-18 (81 y.o. F) Primary Care Provider: [name] Other Clinician: Referring Provider: Treating Provider/Extender: [name] Weeks in Treatment: 11 Subjective Chief Complaint Information obtained from Patient Right ankle ulcer History of Present Illness (HPI) 10-21-2021 upon evaluation today patient appears to be doing poorly in regard to the wound over the medial aspect of her right ankle. This is an area that previously she has had trouble with previously. I have actually seen her in [place] about a year ago but this was on the left ankle at that time. 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 This is also the typical spot for her as it is a very difficult area to compress. Nonetheless previously we have been able to get this under control using her compression socks as well as Xeroform gauze  although I am not sure that is can be the best thing for her at this time we discussed that as we proceed. 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 Nonetheless I do think that we could potentially try some oral steroids, prednisone, to see if we can help calm this down. She is not opposed to this at all. In fact she would like to do anything she can to try to get better she tells me the Achilles area has been very painful at times for her. I really do not see any signs of infection right now but that symptomatic = as well. 10/18; patient with wounds looks a lot better today measuring smaller. She went to her grandsons wedding this weekend did not wear compression wraps. She completed her course of prednisone 50 mg we have been using TCA and Hydrofera Blue on the wound area. 11-11-2021 upon evaluation today patient appears to be doing well currently in regard to her wound in fact she is showing signs of good granulation and epithelization at this point and I am very pleased with where we stand currently. I do not see any signs of infection locally  or systemically which is great news. No fevers, chills, nausea, vomiting, or diarrhea. I do believe that the anti-inflammatories did do well for her. 11-18-2021 upon evaluation today patient's wound is actually showing signs of excellent improvement and very pleased with where we were seeing this improvement thus far. I do not see any evidence of active infection locally or systemically which is great news and overall I do believe we are on the right track here. 11/8; patient comes in complaining of marked pain in this area. She takes Tylenol for the  discomfort. Unfortunately we are not making much progress in the condition of this wound still slough covered. 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 With that being said there does not appear to be any signs of infection which is good news but the wound does not appear to be quite as healthy as what I saw the last time I saw her in 2 weeks and looking back at pictures I really feel like that there were very small compared to where we started there was a little bit of discoloration of the base of the wound that was not there last [name], [name] ([id number]) [id number] Page 5 of 7 week. Some of this was probably just due to bleeding which is understandable from and following debridement but at the same time I do believe that nonetheless she still would benefit from the compression wrapping currently. She is definitely more swollen today than she was previous and she realizes this as well. 12-09-2021 upon evaluation today patient appears to be doing a little better in regard to her wound compared to last time although size wise is not much different it does seem to be improving with regard to the necrotic tissue that is turning more yellow and loosening up which is at least good news. With that being said I do believe that the patient would benefit from likely switching to Santyl this is good to me and we cannot wrap her and she is getting need to actually change this more frequently at home using her compression sock but I think that it may be a better way to go at the moment to see if we can get this cleaned up she is in agreement with that plan. If we get to where we can use  collagen then we will consider going back to the wrap. 12-23-2021 upon evaluation today patient's wound does have some necrotic tissue noted at this point and we are going to need and clean this away today. I discussed that with her. With that being said fortunately there does not appear to be any signs of infection locally or systemically which is great news and overall I am extremely pleased in that regard. Her husband is changing this daily and that does seem to be benefiting her at this point. The wound is a bit smaller today compared to previous evaluation. 01-06-2022 upon evaluation today patient unfortunately appears to be doing worse in regard to her wound. Actually got a call from her yesterday in [place] because she told me that her foot and ankle area was doing much worse with increased pain. Fortunately I do not see any signs of infection systemically though locally I do's definitely  see signs of cellulitis. I discussed that with her today. Nonetheless I do believe that she would benefit from continuation of the antibiotics which I placed her on yesterday. This includes the Bactrim DS although we may need to add something different depend on what the results of the culture come back showing. We are going to do a PCR culture today. Subsequently I am also going to go ahead and give the patient gentamicin cream which I am going to send into the pharmacy for her at this point. Objective Constitutional Well-nourished and well-hydrated in no acute distress. Vitals Time Taken: 9:36 AM, Temperature: 98 F, Pulse: 92 bpm, Respiratory Rate: 18 breaths/min, Blood Pressure: 162/78 mmHg. Respiratory normal breathing without difficulty. Psychiatric this patient is able to make decisions and demonstrates good insight into disease process. Alert and Oriented x 3. pleasant and cooperative. General Notes: Upon inspection patient's wound bed actually showed signs of poor granulation at this point she  has more necrotic tissue unfortunately. With that being said I did discuss with the patient that I feel like she is going to likely need something in order to clean this up but right now more interested and get the infection under control. Integumentary (Hair, Skin) Wound #1 status is Open. Original cause of wound was Gradually Appeared. The date acquired was: 09/30/2021. The wound has been in treatment 11 weeks. The wound is located on the Right,Medial Malleolus. The wound measures 1cm length x 0.6cm width x 0.3cm depth; 0.471cm^2 area and 0.141cm^3 volume. There is Fat Layer (Subcutaneous Tissue) exposed. There is no tunneling or undermining noted. There is a medium amount of serosanguineous drainage noted. The wound margin is distinct with the outline attached to the wound base. There is no granulation within the wound bed. There is a large (67-100%) amount of necrotic tissue within the wound bed including Adherent Slough. The periwound skin appearance exhibited: Erythema. The periwound skin appearance did not exhibit: Callus, Crepitus, Excoriation, Induration, Rash, Scarring, Dry/Scaly, Maceration, Atrophie Blanche, Cyanosis, Ecchymosis, Hemosiderin Staining, Mottled, Pallor, Rubor. The surrounding wound skin color is noted with erythema which is circumferential. Periwound temperature was noted as No Abnormality. The periwound has tenderness on palpation. Assessment Active Problems ICD-10 Livedoid vasculitis Chronic venous hypertension (idiopathic) with ulcer and inflammation of right lower extremity Non-pressure chronic ulcer of right ankle with fat layer exposed Plan Follow-up Appointments: Return Appointment in 1 week. - w/ Dr. Celine Ahr Return Appointment in 2 weeks. [facility] Blacksmith on Wednesday's Other: - If redness spreads outside of marked lines, go to ED!!! Pick up gentamicin antibiotic from your pharmacy!!!:) Anesthetic: (In clinic) Topical Lidocaine 5% applied to wound bed Bathing/  Shower/ Hygiene: May shower with protection but do not get wound dressing(s) wet. - may shower and use dial soap to wash wound, then reapply dressing Edema Control - Lymphedema / SCD / Other: Elevate legs to the level of the heart or above for 30 minutes daily and/or when sitting, a frequency of: - 3-4 times a day throughout the day. [name], [name] ([id number]) [id number] Page 6 of 7 Avoid standing for long periods of time. Patient to wear own compression stockings every day. - apply in the morning and remove at night Exercise regularly The following medication(s) was prescribed: gentamicin topical 0.1 % ointment ointment topical once daily applied in a thin film to the wound bed with each dressing change x 30 days starting 01/06/2022 WOUND #1: - Malleolus Wound Laterality: Right, Medial Cleanser: Normal Saline (Generic) 1 x Per Day/30  Days Discharge Instructions: Cleanse the wound with Normal Saline prior to applying a clean dressing using gauze sponges, not tissue or cotton balls. Cleanser: Soap and Water 1 x Per Day/30 Days Discharge Instructions: May shower and wash wound with dial antibacterial soap and water prior to dressing change. Peri-Wound Care: Skin Prep (Generic) 1 x Per Day/30 Days Discharge Instructions: Use skin prep as directed Topical: Gentamicin 1 x Per Day/30 Days Discharge Instructions: As directed by physician Secondary Dressing: Woven Gauze Sponges 2x2 in (Generic) 1 x Per Day/30 Days Discharge Instructions: Apply over primary dressing as directed. Secondary Dressing: Zetuvit Plus Silicone Border Dressing 4x4 (in/in) (Generic) 1 x Per Day/30 Days Discharge Instructions: Apply silicone border over primary dressing as directed. 1. I am good recommend that we have the patient continue to monitor for any signs of infection or worsening. Obviously based on what I am seeing I do think that the Bactrim is something she should continue on her med  gentamicin today as well topically. 2. Also can recommend that we do the PCR culture which I did send in for her depend on the results of the culture will make any adjustments as necessary going forward. 3. I am also can recommend that the patient should continue to monitor for any signs of infection such as fever, chills, nausea, vomiting, or diarrhea. That happens or if the infection spreads outside of the area marked today she should go to the ER ASAP. We will see patient back for reevaluation in 1 week here in the clinic. If anything worsens or changes patient will contact our office for additional recommendations. Electronic Signature(s) Signed: 01/06/2022 3:18:36 PM By: [name] PA-C Entered By: [name] on 01/06/2022 15:18:36 -------------------------------------------------------------------------------- SuperBill Details Patient Name: Date of Service: Kim Lance [name]. 01/06/2022 Medical Record Number: [id number] Patient Account Number: [id number] Date of Birth/Sex: Treating RN: 31-Mar-1940 (81 y.o. 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 Facility Procedures : CPT4 Code: [id number] Description: [facility] VISIT-LEV 3 EST PT Modifier: Quantity: 1 Physician Procedures : CPT4 Code Description Modifier [id number] - WC PHYS LEVEL 4 - EST PT ICD-10 Diagnosis Description 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

## 2022-01-07 NOTE — Progress Notes (Signed)
[name], [name] ([id number]) [id number] Page 1 of 7 Visit Report for 01/06/2022 Arrival Information Details Patient Name: Date of Service: [name] 01/06/2022 9:15 A M Medical Record Number: [id number] Patient Account Number: [id number] Date of Birth/Sex: Treating RN: 06/03/1940 (81 y.o. F) Primary Care Denetta Fei: [name] Other Clinician: Referring Luverne Farone: Treating Negin Hegg/Extender: [name] Weeks in Treatment: 11 Visit Information History Since Last Visit Added or deleted any medications: No Patient Arrived: Ambulatory Any new allergies or adverse reactions: No Arrival Time: 09:34 Had a fall or experienced change in No Accompanied By: husband activities of daily living that may affect Transfer Assistance: None risk of falls: Patient Identification Verified: Yes Signs or symptoms of abuse/neglect since last visito No Secondary Verification Process Completed: Yes Hospitalized since last visit: No Patient Requires Transmission-Based Precautions: No Implantable device outside of the clinic excluding No Patient Has Alerts: No cellular tissue based products placed in the center since last visit: Has Dressing in Place as Prescribed: Yes Pain Present Now: Yes Electronic Signature(s) Signed: 01/06/2022 5:14:33 PM By: Erenest Blank Entered By: Erenest Blank on 01/06/2022 09:36:51 -------------------------------------------------------------------------------- Clinic Level of Care Assessment Details Patient Name: Date of Service: [name] 01/06/2022 9:15 A M Medical Record Number: [id number] Patient Account Number: [id number] Date of Birth/Sex: Treating RN: 1940/06/23 (81 y.o. [name], [name] Primary Care Leasha Goldberger: [name] Other Clinician: Referring Elieser Tetrick: Treating Jodi Kappes/Extender: [name] Weeks in Treatment: 11 Clinic Level of Care  Assessment Items TOOL 4 Quantity Score X- 1 0 Use when only an EandM is performed on FOLLOW-UP visit ASSESSMENTS - Nursing Assessment / Reassessment X- 1 10 Reassessment of Co-morbidities (includes updates in patient status) X- 1 5 Reassessment of Adherence to Treatment Plan ASSESSMENTS - Wound and Skin A ssessment / Reassessment X - Simple Wound Assessment / Reassessment - one wound 1 5 _0  - 0 Complex Wound Assessment / Reassessment - multiple wounds _1  - 0 Dermatologic / Skin Assessment (not related to wound area) ASSESSMENTS - Focused Assessment X- 1 5 Circumferential Edema Measurements - multi extremities _2  - 0 Nutritional Assessment / Counseling / Intervention [name], [name] ([id number]) [id number] Page 2 of 7 _3  - 0 Lower Extremity Assessment (monofilament, tuning fork, pulses) _4  - 0 Peripheral Arterial Disease Assessment (using hand held doppler) ASSESSMENTS - Ostomy and/or Continence Assessment and Care _5  - 0 Incontinence Assessment and Management _6  - 0 Ostomy Care Assessment and Management (repouching, etc.) PROCESS - Coordination of Care X - Simple Patient / Family Education for ongoing care 1 15 _7  - 0 Complex (extensive) Patient / Family Education for ongoing care X- 1 10 Staff obtains Programmer, systems, Records, [name] Results / Process Orders est _8  - 0 Staff telephones HHA, Nursing Homes / Clarify orders / etc _9  - 0 Routine Transfer to another Facility (non-emergent condition) _10  - 0 Routine Hospital Admission (non-emergent condition) _11  - 0 New Admissions / Biomedical engineer / Ordering NPWT Apligraf, etc. , _12  - 0 Emergency Hospital Admission (emergent condition) X- 1 10 Simple Discharge Coordination _13  - 0 Complex (extensive) Discharge Coordination PROCESS - Special Needs _14  - 0 Pediatric / Minor Patient Management _15  - 0 Isolation Patient Management _16  - 0 Hearing / Language / Visual special needs _17  -  0 Assessment of Community assistance (transportation, D/C planning, etc.) _18  - 0 Additional assistance / Altered mentation _19  - 0 Support Surface(s) Assessment (bed, cushion, seat, etc.) INTERVENTIONS - Wound Cleansing /  Measurement X - Simple Wound Cleansing - one wound 1 5 _0  - 0 Complex Wound Cleansing - multiple wounds X- 1 5 Wound Imaging (photographs - any number of wounds) _1  - 0 Wound Tracing (instead of photographs) X- 1 5 Simple Wound Measurement - one wound _2  - 0 Complex Wound Measurement - multiple wounds INTERVENTIONS - Wound Dressings X - Small Wound Dressing one or multiple wounds 1 10 _3  - 0 Medium Wound Dressing one or multiple wounds _4  - 0 Large Wound Dressing one or multiple wounds X- 1 5 Application of Medications - topical <CHENIDPOEUMPNTIR>_4<\/ERXVQMGQQPYPPJKD>_3  - 0 Application of Medications - injection INTERVENTIONS - Miscellaneous _6  - 0 External ear exam _7  - 0 Specimen Collection (cultures, biopsies, blood, body fluids, etc.) _8  - 0 Specimen(s) / Culture(s) sent or taken to Lab for analysis _9  - 0 Patient Transfer (multiple staff / Civil Service fast streamer / Similar devices) _10  - 0 Simple Staple / Suture removal (25 or less) _11  - 0 Complex Staple / Suture removal (26 or more) _12  - 0 Hypo / Hyperglycemic Management (close monitor of Blood Glucose) Wynder, Marvie [name] ([id number]) [id number] Page 3 of 7 _13  - 0 Ankle / Brachial Index (ABI) - do not check if billed separately X- 1 5 Vital Signs Has the patient been seen at the hospital within the last three years: Yes Total Score: 95 Level Of Care: New/Established - Level 3 Electronic Signature(s) Signed: 01/06/2022 5:39:23 PM By: Rhae Hammock RN Entered By: Rhae Hammock on 01/06/2022 10:15:00 -------------------------------------------------------------------------------- Encounter Discharge Information Details Patient Name: Date of Service: [name], [name]. 01/06/2022 9:15 A M Medical Record  Number: [id number] Patient Account Number: [id number] Date of Birth/Sex: Treating RN: Apr 07, 1940 (81 y.o. [name], [name] Primary Care Marquesha Robideau: [name] Other Clinician: Referring Eliah Ozawa: Treating Tyrel Lex/Extender: [name] Weeks in Treatment: 11 Encounter Discharge Information Items Discharge Condition: Stable Ambulatory Status: Ambulatory Discharge Destination: Home Transportation: Private Auto Accompanied By: husband Schedule Follow-up Appointment: Yes Clinical Summary of Care: Patient Declined Electronic Signature(s) Signed: 01/06/2022 5:39:23 PM By: Rhae Hammock RN Entered By: Rhae Hammock on 01/06/2022 10:16:32 -------------------------------------------------------------------------------- Lower Extremity Assessment Details Patient Name: Date of Service: [name], [name]. 01/06/2022 9:15 A M Medical Record Number: [id number] Patient Account Number: [id number] Date of Birth/Sex: Treating RN: 1940/04/25 (81 y.o. F) Primary Care Modesto Ganoe: [name] Other Clinician: Referring Natashia Roseman: Treating Duan Scharnhorst/Extender: [name] Weeks in Treatment: 11 Edema Assessment Assessed: [name]: No] [Right: No] Edema: [Left: Ye] [Right: s] Calf Left: Right: Point of Measurement: 37 cm From Medial Instep 40.6 cm Ankle Left: Right: Point of Measurement: 8 cm From Medial Instep 23 cm Electronic Signature(s) Signed: 01/06/2022 5:14:33 PM By: Nelly Laurence, Scherrie [name] ([id number]) PM By: Athena Masse.pdf Page 4 of 7 Signed: 01/06/2022 5:14:33 Entered By: Erenest Blank on 01/06/2022 09:42:49 -------------------------------------------------------------------------------- Multi-Disciplinary Care Plan Details Patient Name: Date of Service: [name], [name] 01/06/2022 9:15 A M Medical Record Number: [id number] Patient Account Number: [id number] Date of  Birth/Sex: Treating RN: 09/04/1940 (81 y.o. [name], [name] Primary Care Virgel Haro: [name] Other Clinician: Referring Abhi Moccia: Treating Melinda Pottinger/Extender: [name] Weeks in Treatment: 11 Active Inactive Wound/Skin Impairment Nursing Diagnoses: Impaired tissue integrity Knowledge deficit related to smoking impact on wound healing Goals: Patient will have a decrease in wound volume by X% from date: (specify in notes) Date Initiated: 10/21/2021 Target Resolution Date: 01/16/2022 Goal Status: Active Patient/caregiver will  verbalize understanding of skin care regimen Date Initiated: 10/21/2021 Date Inactivated: 11/11/2021 Target Resolution Date: 11/21/2021 Goal Status: Met Ulcer/skin breakdown will have a volume reduction of 30% by week 4 Date Initiated: 10/21/2021 Target Resolution Date: 01/16/2022 Goal Status: Active Interventions: Assess patient/caregiver ability to obtain necessary supplies Assess patient/caregiver ability to perform ulcer/skin care regimen upon admission and as needed Assess ulceration(s) every visit Notes: Electronic Signature(s) Signed: 01/06/2022 5:39:23 PM By: Rhae Hammock RN Entered By: Rhae Hammock on 01/06/2022 10:13:12 -------------------------------------------------------------------------------- Pain Assessment Details Patient Name: Date of Service: [name], Kyrah [name]. 01/06/2022 9:15 A M Medical Record Number: [id number] Patient Account Number: [id number] Date of Birth/Sex: Treating RN: [birth year] (81 y.o. F) Primary Care Kenly Xiao: [name] Other Clinician: Referring Malachai Schalk: Treating Janda Cargo/Extender: [name] Weeks in Treatment: 11 Active Problems Location of Pain Severity and Description of Pain Patient Has Paino Yes Site Locations Pain Location: TAMATHA, GADBOIS [name] ([id number]) [id number] Page 5 of 7 Pain Location: Pain  in Ulcers Rate the pain. Current Pain Level: 9 Character of Pain Describe the Pain: Other: stinging Pain Management and Medication Current Pain Management: How does your wound impact your activities of daily livingo Sleep: Yes Electronic Signature(s) Signed: 01/06/2022 5:14:33 PM By: Erenest Blank Entered By: Erenest Blank on 01/06/2022 09:37:37 -------------------------------------------------------------------------------- Patient/Caregiver Education Details Patient Name: Date of Service: Kim Lance [name]. 12/20/2023andnbsp9:15 A M Medical Record Number: [id number] Patient Account Number: [id number] Date of Birth/Gender: Treating RN: 04-21-1940 (81 y.o. [name], [name] Primary Care Physician: [name] Other Clinician: Referring Physician: Treating Physician/Extender: Karie Schwalbe in Treatment: 11 Education Assessment Education Provided To: Patient Education Topics Provided Wound/Skin Impairment: Methods: Explain/Verbal Responses: Reinforcements needed, State content correctly Electronic Signature(s) Signed: 01/06/2022 5:39:23 PM By: Rhae Hammock RN Entered By: Rhae Hammock on 01/06/2022 10:14:19 [place], Tomorrow [name] ([id number]) [id number] Page 6 of 7 -------------------------------------------------------------------------------- Wound Assessment Details Patient Name: Date of Service: [name], [name] 01/06/2022 9:15 A M Medical Record Number: [id number] Patient Account Number: [id number] Date of Birth/Sex: Treating RN: [birth year] (81 y.o. F) Primary Care Tarah Buboltz: [name] Other Clinician: Referring Verlyn Dannenberg: Treating Kadyn Chovan/Extender: [name] Weeks in Treatment: 11 Wound Status Wound Number: 1 Primary Etiology: Venous Leg Ulcer Wound Location: Right, Medial Malleolus Wound Status: Open Wounding Event: Gradually Appeared Comorbid History:  Peripheral Venous Disease Date Acquired: 09/30/2021 Weeks Of Treatment: 11 Clustered Wound: No Photos Wound Measurements Length: (cm) 1 Width: (cm) 0. Depth: (cm) 0. Area: (cm) 0 Volume: (cm) 0 % Reduction in Area: 73.3% 6 % Reduction in Volume: 60.1% 3 Epithelialization: Medium (34-66%) .471 Tunneling: No .141 Undermining: No Wound Description Classification: Full Thickness With Exposed Suppor Wound Margin: Distinct, outline attached Exudate Amount: Medium Exudate Type: Serosanguineous Exudate Color: red, brown [name] Structures Foul Odor After Cleansing: No Slough/Fibrino Yes Wound Bed Granulation Amount: None Present (0%) Exposed Structure Necrotic Amount: Large (67-100%) Fascia Exposed: No Necrotic Quality: Adherent Slough Fat Layer (Subcutaneous Tissue) Exposed: Yes Tendon Exposed: No Muscle Exposed: No Joint Exposed: No Bone Exposed: No Periwound Skin Texture Texture Color No Abnormalities Noted: No No Abnormalities Noted: No Callus: No Atrophie Blanche: No Crepitus: No Cyanosis: No Excoriation: No Ecchymosis: No Induration: No Erythema: Yes Rash: No Erythema Location: Circumferential Scarring: No Hemosiderin Staining: No Mottled: No Moisture Pallor: No No Abnormalities Noted: No Rubor: No Dry / Scaly: No Maceration: No Temperature / Pain Temperature: No Abnormality Tenderness on Palpation: Yes Treatment Notes  Wound #1 (Malleolus) Wound Laterality: Right, Medial [name], [name] ([id number]) [id number] Page 7 of 7 Cleanser Normal Saline Discharge Instruction: Cleanse the wound with Normal Saline prior to applying a clean dressing using gauze sponges, not tissue or cotton balls. Soap and Water Discharge Instruction: May shower and wash wound with dial antibacterial soap and water prior to dressing change. Peri-Wound Care Skin Prep Discharge Instruction: Use skin prep as directed Topical Gentamicin Discharge  Instruction: As directed by physician Primary Dressing Secondary Dressing Woven Gauze Sponges 2x2 in Discharge Instruction: Apply over primary dressing as directed. Zetuvit Plus Silicone Border Dressing 4x4 (in/in) Discharge Instruction: Apply silicone border over primary dressing as directed. Secured With Compression Wrap Compression Stockings Environmental education officer) Signed: 01/06/2022 5:14:33 PM By: Erenest Blank Entered By: Erenest Blank on 01/06/2022 09:44:37 -------------------------------------------------------------------------------- Vitals Details Patient Name: Date of Service: [name], Khamila [name]. 01/06/2022 9:15 A M Medical Record Number: [id number] Patient Account Number: [id number] Date of Birth/Sex: Treating RN: 09/03/1940 (81 y.o. F) Primary Care Brodi Kari: [name] Other Clinician: Referring Sanjuan Sawa: Treating Parley Pidcock/Extender: [name] Weeks in Treatment: 11 Vital Signs Time Taken: 09:36 Temperature (F): 98 Pulse (bpm): 92 Respiratory Rate (breaths/min): 18 Blood Pressure (mmHg): 162/78 Reference Range: 80 - 120 mg / dl Electronic Signature(s) Signed: 01/06/2022 5:14:33 PM By: Erenest Blank Entered By: Erenest Blank on 01/06/2022 09:37:12

## 2022-01-13 ENCOUNTER — Encounter (HOSPITAL_BASED_OUTPATIENT_CLINIC_OR_DEPARTMENT_OTHER): Payer: Medicare Other | Admitting: General Surgery

## 2022-01-13 DIAGNOSIS — L950 Livedoid vasculitis: Secondary | ICD-10-CM | POA: Diagnosis not present

## 2022-01-13 DIAGNOSIS — B965 Pseudomonas (aeruginosa) (mallei) (pseudomallei) as the cause of diseases classified elsewhere: Secondary | ICD-10-CM | POA: Diagnosis not present

## 2022-01-13 DIAGNOSIS — I87331 Chronic venous hypertension (idiopathic) with ulcer and inflammation of right lower extremity: Secondary | ICD-10-CM | POA: Diagnosis not present

## 2022-01-13 DIAGNOSIS — I872 Venous insufficiency (chronic) (peripheral): Secondary | ICD-10-CM | POA: Diagnosis not present

## 2022-01-13 DIAGNOSIS — L97312 Non-pressure chronic ulcer of right ankle with fat layer exposed: Secondary | ICD-10-CM | POA: Diagnosis not present

## 2022-01-15 NOTE — Progress Notes (Signed)
Cichy, Charelle [name] ([id number]) [id number] Page 1 of 8 Visit Report for 01/13/2022 Arrival Information Details Patient Name: Date of Service: CLA YTO N, [name]. 01/13/2022 8:00 A M Medical Record Number: [id number] Patient Account Number: [id number] Date of Birth/Sex: Treating RN: 08/21/1940 (81 y.o. F) [name], Kim Primary Care Provider: Duncan, Graham Shaw Other Clinician: Referring Provider: Treating Provider/Extender: Cannon, Jennifer Duncan, Graham Shaw Weeks in Treatment: 12 Visit Information History Since Last Visit Added or deleted any medications: No Patient Arrived: Ambulatory Any new allergies or adverse reactions: No Arrival Time: 08:54 Had a fall or experienced change in No Accompanied By: husband activities of daily living that may affect Transfer Assistance: None risk of falls: Patient Identification Verified: Yes Signs or symptoms of abuse/neglect since last visito No Secondary Verification Process Completed: Yes Hospitalized since last visit: No Patient Requires Transmission-Based Precautions: No Implantable device outside of the clinic excluding No Patient Has Alerts: No cellular tissue based products placed in the center since last visit: Has Dressing in Place as Prescribed: Yes Has Compression in Place as Prescribed: Yes Pain Present Now: Yes Electronic Signature(s) Signed: 01/14/2022 6:05:29 PM By: [name], Bobbi RN, BSN Entered By: [name], Kim on 01/13/2022 08:54:33 -------------------------------------------------------------------------------- Encounter Discharge Information Details Patient Name: Date of Service: CLA YTO N, [name]. 01/13/2022 8:00 A M Medical Record Number: [id number] Patient Account Number: [id number] Date of Birth/Sex: Treating RN: 08/26/1940 (81 y.o. F) [name], Kim Primary Care Provider: Duncan, Graham Shaw Other Clinician: Referring Provider: Treating Provider/Extender: Cannon, Jennifer Duncan,  Graham Shaw Weeks in Treatment: 12 Encounter Discharge Information Items Post Procedure Vitals Discharge Condition: Stable Temperature (F): 98.2 Ambulatory Status: Ambulatory Pulse (bpm): 87 Discharge Destination: Home Respiratory Rate (breaths/min): 20 Transportation: Private Auto Blood Pressure (mmHg): 152/80 Accompanied By: husband Schedule Follow-up Appointment: Yes Clinical Summary of Care: Electronic Signature(s) Signed: 01/14/2022 6:05:29 PM By: [name], Bobbi RN, BSN Entered By: [name], Kim on 01/13/2022 09:21:08 George, [name] ([id number]) [id number] Page 2 of 8 -------------------------------------------------------------------------------- Lower Extremity Assessment Details Patient Name: Date of Service: CLA YTO N, [name]. 01/13/2022 8:00 A M Medical Record Number: [id number] Patient Account Number: [id number] Date of Birth/Sex: Treating RN: 12/11/1940 (81 y.o. F) [name], Kim Primary Care Provider: Duncan, Graham Shaw Other Clinician: Referring Provider: Treating Provider/Extender: Cannon, Jennifer Duncan, Graham Shaw Weeks in Treatment: 12 Edema Assessment Assessed: [Left: No] [Right: Yes] Edema: [Left: N] [Right: o] Calf Left: Right: Point of Measurement: 37 cm From Medial Instep 38.5 cm Ankle Left: Right: Point of Measurement: 8 cm From Medial Instep 21.5 cm Vascular Assessment Pulses: Dorsalis Pedis Palpable: [Right:Yes] Electronic Signature(s) Signed: 01/14/2022 6:05:29 PM By: [name], Bobbi RN, BSN Entered By: [name], Kim on 01/13/2022 08:57:00 -------------------------------------------------------------------------------- Multi Wound Chart Details Patient Name: Date of Service: CLA YTO N, [name]. 01/13/2022 8:00 A M Medical Record Number: [id number] Patient Account Number: [id number] Date of Birth/Sex: Treating RN: 12/10/1940 (81 y.o. F) Primary Care Provider: Duncan, Graham Shaw Other Clinician: Referring  Provider: Treating Provider/Extender: Cannon, Jennifer Duncan, Graham Shaw Weeks in Treatment: 12 Vital Signs Height(in): Pulse(bpm): 87 Weight(lbs): Blood Pressure(mmHg): 152/80 Body Mass Index(BMI): Temperature(°F): 98.2 Respiratory Rate(breaths/min): 20 [1:Photos:] [N/A:N/A] Right, Medial Malleolus N/A N/A Wound Location: Gradually Appeared N/A N/A Wounding Event: Venous Leg Ulcer N/A N/A Primary Etiology: Peripheral Venous Disease N/A N/A Comorbid History: 09/30/2021 N/A N/A Date Acquired: 12 N/A N/A Weeks of Treatment: Open N/A N/A Wound Status: No N/A N/A Wound Recurrence: 1.5x1.2x0.3 N/A N/A Measurements L x W x D (cm) 1.414 N/A N/A A (cm) :   rea 0.424 N/A N/A Volume (cm) : 20.00% N/A N/A % Reduction in A rea: -20.10% N/A N/A % Reduction in Volume: Full Thickness With Exposed Support N/A N/A Classification: Structures Medium N/A N/A Exudate A mount: Purulent N/A N/A Exudate Type: yellow, brown, green N/A N/A Exudate Color: Distinct, outline attached N/A N/A Wound Margin: None Present (0%) N/A N/A Granulation A mount: Large (67-100%) N/A N/A Necrotic A mount: Fat Layer (Subcutaneous Tissue): Yes N/A N/A Exposed Structures: Fascia: No Tendon: No Muscle: No Joint: No Bone: No Medium (34-66%) N/A N/A Epithelialization: Debridement - Selective/Open Wound N/A N/A Debridement: Pre-procedure Verification/Time Out 09:15 N/A N/A Taken: Lidocaine 4% Topical Solution N/A N/A Pain Control: Slough N/A N/A Tissue Debrided: Non-Viable Tissue N/A N/A Level: 1.8 N/A N/A Debridement A (sq cm): rea Curette N/A N/A Instrument: Minimum N/A N/A Bleeding: Pressure N/A N/A Hemostasis A chieved: 0 N/A N/A Procedural Pain: 3 N/A N/A Post Procedural Pain: Procedure was tolerated well N/A N/A Debridement Treatment Response: 1.5x1.2x0.3 N/A N/A Post Debridement Measurements L x W x D (cm) 0.424 N/A N/A Post Debridement Volume: (cm) Excoriation:  No N/A N/A Periwound Skin Texture: Induration: No Callus: No Crepitus: No Rash: No Scarring: No Maceration: No N/A N/A Periwound Skin Moisture: Dry/Scaly: No Erythema: Yes N/A N/A Periwound Skin Color: Atrophie Blanche: No Cyanosis: No Ecchymosis: No Hemosiderin Staining: No Mottled: No Pallor: No Rubor: No Circumferential N/A N/A Erythema Location: Decreased N/A N/A Erythema Change: No Abnormality N/A N/A Temperature: Yes N/A N/A Tenderness on Palpation: Debridement N/A N/A Procedures Performed: Treatment Notes Wound #1 (Malleolus) Wound Laterality: Right, Medial Cleanser Normal Saline Discharge Instruction: Cleanse the wound with Normal Saline prior to applying a clean dressing using gauze sponges, not tissue or cotton balls. Soap and Water Discharge Instruction: May shower and wash wound with dial antibacterial soap and water prior to dressing change. Peri-Wound Care Sween Lotion (Moisturizing lotion) Discharge Instruction: Apply moisturizing lotion as directed Topical Gentamicin Discharge Instruction: As directed by physician Moosman, Sefora [name] ([id number]) [id number] Page 4 of 8 Primary Dressing Sorbalgon AG Dressing 2x2 (in/in) Discharge Instruction: Apply to wound bed as instructed Secondary Dressing ABD Pad, 8x10 Discharge Instruction: Apply over primary dressing as directed. Secured With Compression Wrap ThreePress (3 layer compression wrap) Discharge Instruction: Apply three layer compression as directed. Compression Stockings Add-Ons Electronic Signature(s) Signed: 01/13/2022 9:47:07 AM By: Cannon, Jennifer MD FACS Entered By: Cannon, Jennifer on 01/13/2022 09:47:07 -------------------------------------------------------------------------------- Multi-Disciplinary Care Plan Details Patient Name: Date of Service: CLA YTO N, [name]. 01/13/2022 8:00 A M Medical Record Number: [id number] Patient Account Number:  [id number] Date of Birth/Sex: Treating RN: 12/19/1940 (81 y.o. F) [name], Kim Primary Care Provider: Duncan, Graham Shaw Other Clinician: Referring Provider: Treating Provider/Extender: Cannon, Jennifer Duncan, Graham Shaw Weeks in Treatment: 12 Active Inactive Wound/Skin Impairment Nursing Diagnoses: Impaired tissue integrity Knowledge deficit related to smoking impact on wound healing Goals: Patient will have a decrease in wound volume by X% from date: (specify in notes) Date Initiated: 10/21/2021 Target Resolution Date: 02/20/2022 Goal Status: Active Patient/caregiver will verbalize understanding of skin care regimen Date Initiated: 10/21/2021 Date Inactivated: 11/11/2021 Target Resolution Date: 11/21/2021 Goal Status: Met Ulcer/skin breakdown will have a volume reduction of 30% by week 4 Date Initiated: 10/21/2021 Date Inactivated: 01/13/2022 Target Resolution Date: 01/16/2022 Unmet Reason: see wound Goal Status: Unmet measurement. Interventions: Assess patient/caregiver ability to obtain necessary supplies Assess patient/caregiver ability to perform ulcer/skin care regimen upon admission and as needed Assess ulceration(s) every visit Notes: Electronic Signature(s) Signed: 01/14/2022 6:05:29   PM By: [name], Bobbi RN, BSN Entered By: [name], Kim on 01/13/2022 09:02:08 Sicard, Zakhia [name] ([id number]) [id number] Page 5 of 8 -------------------------------------------------------------------------------- Pain Assessment Details Patient Name: Date of Service: CLA YTO N, [name]. 01/13/2022 8:00 A M Medical Record Number: [id number] Patient Account Number: [id number] Date of Birth/Sex: Treating RN: 03/26/1940 (81 y.o. F) [name], Kim Primary Care Provider: Duncan, Graham Shaw Other Clinician: Referring Provider: Treating Provider/Extender: Cannon, Jennifer Duncan, Graham Shaw Weeks in Treatment: 12 Active Problems Location of Pain Severity and  Description of Pain Patient Has Paino Yes Site Locations Pain Location: Pain in Ulcers Rate the pain. Current Pain Level: 7 Pain Management and Medication Current Pain Management: Medication: No Cold Application: No Rest: No Massage: No Activity: No [name].E.N.S.: No Heat Application: No Leg drop or elevation: No Is the Current Pain Management Adequate: Adequate How does your wound impact your activities of daily livingo Sleep: No Bathing: No Appetite: No Relationship With Others: No Bladder Continence: No Emotions: No Bowel Continence: No Work: No Toileting: No Drive: No Dressing: No Hobbies: No Electronic Signature(s) Signed: 01/14/2022 6:05:29 PM By: [name], Bobbi RN, BSN Entered By: [name], Kim on 01/13/2022 08:55:02 -------------------------------------------------------------------------------- Patient/Caregiver Education Details Patient Name: Date of Service: CLA YTO N, Dominigue [name]. 12/27/2023andnbsp8:00 A M Medical Record Number: [id number] Patient Account Number: [id number] Date of Birth/Gender: Treating RN: 04/16/1940 (81 y.o. F) [name], Kim Primary Care Physician: Duncan, Graham Shaw Other Clinician: Referring Physician: Treating Physician/Extender: Cannon, Jennifer Duncan, Graham Shaw Haskin, Jayna [name] ([id number]) [id number] Page 6 of 8 Weeks in Treatment: 12 Education Assessment Education Provided To: Patient Education Topics Provided Wound/Skin Impairment: Handouts: Caring for Your Ulcer Methods: Explain/Verbal Responses: Reinforcements needed Electronic Signature(s) Signed: 01/14/2022 6:05:29 PM By: [name], Bobbi RN, BSN Entered By: [name], Kim on 01/13/2022 09:02:20 -------------------------------------------------------------------------------- Wound Assessment Details Patient Name: Date of Service: CLA YTO N, Annitta [name]. 01/13/2022 8:00 A M Medical Record Number: [id number] Patient Account Number: [id number] Date of  Birth/Sex: Treating RN: 09/19/1940 (81 y.o. F) [name], Kim Primary Care Provider: Duncan, Graham Shaw Other Clinician: Referring Provider: Treating Provider/Extender: Cannon, Jennifer Duncan, Graham Shaw Weeks in Treatment: 12 Wound Status Wound Number: 1 Primary Etiology: Venous Leg Ulcer Wound Location: Right, Medial Malleolus Wound Status: Open Wounding Event: Gradually Appeared Comorbid History: Peripheral Venous Disease Date Acquired: 09/30/2021 Weeks Of Treatment: 12 Clustered Wound: No Photos Wound Measurements Length: (cm) 1.5 Width: (cm) 1.2 Depth: (cm) 0.3 Area: (cm) 1.414 Volume: (cm) 0.424 % Reduction in Area: 20% % Reduction in Volume: -20.1% Epithelialization: Medium (34-66%) Tunneling: No Undermining: No Wound Description Classification: Full Thickness With Exposed Support St Wound Margin: Distinct, outline attached Exudate Amount: Medium Exudate Type: Purulent Exudate Color: yellow, brown, green ructures Foul Odor After Cleansing: No Slough/Fibrino Yes Wound Bed Koplin, Alcie [name] ([id number]) [id number] Page 7 of 8 Granulation Amount: None Present (0%) Exposed Structure Necrotic Amount: Large (67-100%) Fascia Exposed: No Necrotic Quality: Adherent Slough Fat Layer (Subcutaneous Tissue) Exposed: Yes Tendon Exposed: No Muscle Exposed: No Joint Exposed: No Bone Exposed: No Periwound Skin Texture Texture Color No Abnormalities Noted: No No Abnormalities Noted: No Callus: No Atrophie Blanche: No Crepitus: No Cyanosis: No Excoriation: No Ecchymosis: No Induration: No Erythema: Yes Rash: No Erythema Location: Circumferential Scarring: No Erythema Change: Decreased Hemosiderin Staining: No Moisture Mottled: No No Abnormalities Noted: No Pallor: No Dry / Scaly: No Rubor: No Maceration: No Temperature / Pain Temperature: No Abnormality Tenderness on Palpation: Yes Treatment Notes Wound #1 (Malleolus) Wound  Laterality: Right, Medial Cleanser Normal Saline Discharge   Instruction: Cleanse the wound with Normal Saline prior to applying a clean dressing using gauze sponges, not tissue or cotton balls. Soap and Water Discharge Instruction: May shower and wash wound with dial antibacterial soap and water prior to dressing change. Peri-Wound Care Sween Lotion (Moisturizing lotion) Discharge Instruction: Apply moisturizing lotion as directed Topical Gentamicin Discharge Instruction: As directed by physician Primary Dressing Sorbalgon AG Dressing 2x2 (in/in) Discharge Instruction: Apply to wound bed as instructed Secondary Dressing ABD Pad, 8x10 Discharge Instruction: Apply over primary dressing as directed. Secured With Compression Wrap ThreePress (3 layer compression wrap) Discharge Instruction: Apply three layer compression as directed. Compression Stockings Add-Ons Electronic Signature(s) Signed: 01/14/2022 6:05:29 PM By: Deon Pilling RN, BSN Entered By: Deon Pilling on 01/13/2022 08:59:47 Vitals Details -------------------------------------------------------------------------------- [name] ([id number]) [id number] Page 8 of 8 Patient Name: Date of Service: LUSINE, CORLETT [name]. 01/13/2022 8:00 A M Medical Record Number: [id number] Patient Account Number: [id number] Date of Birth/Sex: Treating RN: 28-Mar-1940 (81 y.o. [name], Tammi Klippel Primary Care Alvina Strother: [name] Other Clinician: Referring Zarin Knupp: Treating Wilton Thrall/Extender: Venancio Poisson Weeks in Treatment: 12 Vital Signs Time Taken: 08:54 Temperature (F): 98.2 Pulse (bpm): 87 Respiratory Rate (breaths/min): 20 Blood Pressure (mmHg): 152/80 Reference Range: 80 - 120 mg / dl Electronic Signature(s) Signed: 01/14/2022 6:05:29 PM By: Deon Pilling RN, BSN Entered By: Deon Pilling on 01/13/2022 08:54:52

## 2022-01-15 NOTE — Progress Notes (Signed)
GESELLE, CARDOSA ([id number]) [id number] Page 1 of 9 Visit Report for 01/13/2022 Chief Complaint Document Details Patient Name: Date of Service: [name], [name]. 01/13/2022 8:00 A M Medical Record Number: [id number] Patient Account Number: [id number] Date of Birth/Sex: Treating RN: 09/01/1940 (81 y.o. F) Primary Care Provider: [name] Other Clinician: Referring Provider: Treating Provider/Extender: Venancio Poisson Weeks in Treatment: 12 Information Obtained from: Patient Chief Complaint Right ankle ulcer Electronic Signature(s) Signed: 01/13/2022 9:47:14 AM By: Fredirick Maudlin MD FACS Entered By: Fredirick Maudlin on 01/13/2022 09:47:14 -------------------------------------------------------------------------------- Debridement Details Patient Name: Date of Service: [name], [name]. 01/13/2022 8:00 A M Medical Record Number: [id number] Patient Account Number: [id number] Date of Birth/Sex: Treating RN: 03-24-1940 (81 y.o. [name], [name] Primary Care Provider: [name] Other Clinician: Referring Provider: Treating Provider/Extender: Venancio Poisson Weeks in Treatment: 12 Debridement Performed for Assessment: Wound #1 Right,Medial Malleolus Performed By: Physician Fredirick Maudlin, MD Debridement Type: Debridement Severity of Tissue Pre Debridement: Fat layer exposed Level of Consciousness (Pre-procedure): Awake and Alert Pre-procedure Verification/Time Out Yes - 09:15 Taken: Start Time: 09:16 Pain Control: Lidocaine 4% [name] Area Debrided (L x W): otal 1.5 (cm) x 1.2 (cm) = 1.8 (cm) Tissue and other material debrided: Non-Viable, Slough, Slough Level: Non-Viable Tissue Debridement Description: Selective/Open Wound Instrument: Curette Bleeding: Minimum Hemostasis Achieved: Pressure End Time: 09:19 Procedural Pain: 0 Post Procedural Pain: 3 Response to  Treatment: Procedure was tolerated well Level of Consciousness (Post- Awake and Alert procedure): Post Debridement Measurements of Total Wound Length: (cm) 1.5 Width: (cm) 1.2 Depth: (cm) 0.3 Volume: (cm) 0.424 Character of Wound/Ulcer Post Debridement: Improved Severity of Tissue Post Debridement: Fat layer exposed [name] ([id number]) [id number] Page 2 of 9 Post Procedure Diagnosis Same as Pre-procedure Electronic Signature(s) Signed: 01/13/2022 12:13:53 PM By: Fredirick Maudlin MD FACS Signed: 01/14/2022 6:05:29 PM By: Deon Pilling RN, BSN Entered By: Deon Pilling on 01/13/2022 09:19:58 -------------------------------------------------------------------------------- HPI Details Patient Name: Date of Service: [name], [name]. 01/13/2022 8:00 A M Medical Record Number: [id number] Patient Account Number: [id number] Date of Birth/Sex: Treating RN: 1940-06-20 (81 y.o. F) Primary Care Provider: [name] Other Clinician: Referring Provider: Treating Provider/Extender: Venancio Poisson Weeks in Treatment: 12 History of Present Illness HPI Description: 10-21-2021 upon evaluation today patient appears to be doing poorly in regard to the wound over the medial aspect of her right ankle. This is an area that previously she has had trouble with previously. I have actually seen her in [place] about a year ago but this was on the left ankle at that time. 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 This is also the typical spot for her as it is a very difficult area to compress. Nonetheless previously we have been able to get this under control using her compression socks as well as Xeroform gauze although I am not sure that is can be the best thing for her at this time we discussed that as we proceed. Patient does have chronic venous hypertension and a history of  vasculitis but otherwise no major medical problems. 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 Nonetheless I do  think that we could potentially try some oral steroids, prednisone, to see if we can help calm this down. She is not opposed to this at all. In fact she would like to do anything she can to try to get better she tells me the Achilles area has been very painful at times for her. I really do not see any signs of infection right now but that symptomatic = as well. 10/18; patient with wounds looks a lot better today measuring smaller. She went to her grandsons wedding this weekend did not wear compression wraps. She completed her course of prednisone 50 mg we have been using TCA and Hydrofera Blue on the wound area. 11-11-2021 upon evaluation today patient appears to be doing well currently in regard to her wound in fact she is showing signs of good granulation and epithelization at this point and I am very pleased with where we stand currently. I do not see any signs of infection locally or systemically which is great news. No fevers, chills, nausea, vomiting, or diarrhea. I do believe that the anti-inflammatories did do well for her. 11-18-2021 upon evaluation today patient's wound is actually showing signs of excellent improvement and very pleased with where we were seeing this improvement thus far. I do not see any evidence of active infection locally or systemically which is great news and overall I do believe we are on the right track here. 11/8; patient comes in complaining of marked pain in this area. She takes Tylenol for the discomfort. Unfortunately we are not making much progress in the condition of this wound still slough covered. She carries a diagnosis of livedoid vasculitis. She also has  severe chronic venous hypertension 12-02-2021 upon evaluation today patient appears to be doing well currently in regard to her wound although is not significantly smaller I do think the compression wrap is better than her compression sock based on what we are seeing currently. With that being said there does not appear to be any signs of infection which is good news but the wound does not appear to be quite as healthy as what I saw the last time I saw her in 2 weeks and looking back at pictures I really feel like that there were very small compared to where we started there was a little bit of discoloration of the base of the wound that was not there last week. Some of this was probably just due to bleeding which is understandable from and following debridement but at the same time I do believe that nonetheless she still would benefit from the compression wrapping currently. She is definitely more swollen today than she was previous and she realizes this as well. 12-09-2021 upon evaluation today patient appears to be doing a little better in regard to her wound compared to last time although size wise is not much different it does seem to be improving with regard to the necrotic tissue that is turning more yellow and loosening up which is at least good news. With that being said I do believe that the patient would benefit from likely switching to Santyl this is good to me and we cannot wrap her and she is getting need to actually change this more frequently at home using her compression sock but I think that it may be a better way to go at the moment to see if we can get this cleaned up she is in agreement with that plan. If we get to where we can use collagen  then we will consider going back to the wrap. 12-23-2021 upon evaluation today patient's wound does have some necrotic tissue noted at this point and we are going to need and clean this away today. I discussed that with her. With that being said  fortunately there does not appear to be any signs of infection locally or systemically which is great news and overall I am extremely pleased in that regard. Her husband is changing this daily and that does seem to be benefiting her at this point. The wound is a bit smaller today compared to previous evaluation. 01-06-2022 upon evaluation today patient unfortunately appears to be doing worse in regard to her wound. Actually got a call from her yesterday in [place] because she told me that her foot and ankle area was doing much worse with increased pain. Fortunately I do not see any signs of infection systemically though locally I do's definitely see signs of cellulitis. I discussed that with her today. Nonetheless I do believe that she would benefit from continuation of the antibiotics which I placed her on yesterday. This includes the Bactrim DS although we may need to add something different depend on what the results of the culture come back showing. We are going to do a PCR culture today. Subsequently I am also going to go ahead and give the patient gentamicin cream which I am going to send into the pharmacy for her at this point. 01/13/2022: The culture that was taken last week returned positive for Pseudomonas, which certainly would not be covered by the Bactrim that was prescribed. She is having more pain in the wound and continues to have blue-green drainage on her dressing. There is substantial slough within the wound. [name], [name] ([id number]) [id number] Page 3 of 9 Electronic Signature(s) Signed: 01/13/2022 9:47:59 AM By: Fredirick Maudlin MD FACS Entered By: Fredirick Maudlin on 01/13/2022 09:47:59 -------------------------------------------------------------------------------- Physical Exam Details Patient Name: Date of Service: [name], [name]. 01/13/2022 8:00 A M Medical Record Number: [id number] Patient Account Number: [id number] Date of  Birth/Sex: Treating RN: 09-01-1940 (81 y.o. F) Primary Care Provider: [name] Other Clinician: Referring Provider: Treating Provider/Extender: Venancio Poisson Weeks in Treatment: 12 Constitutional She is hypertensive, but asymptomatic.. . . . No acute distress. Respiratory Normal work of breathing on room air. Notes 01/13/2022: There is thick slough filling the wound. There is periwound erythema and the patient complains of significant tenderness. Electronic Signature(s) Signed: 01/13/2022 9:49:34 AM By: Fredirick Maudlin MD FACS Entered By: Fredirick Maudlin on 01/13/2022 09:49:33 -------------------------------------------------------------------------------- Physician Orders Details Patient Name: Date of Service: [name], [name]. 01/13/2022 8:00 A M Medical Record Number: [id number] Patient Account Number: [id number] Date of Birth/Sex: Treating RN: [birth year] (81 y.o. 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 Margarita Grizzle on Wednesday Return A ppointment in 2 weeks. Burman Blacksmith on Wednesday's Return A Other: - pick up levaquin at pharmacy. Continue the gentamicin ointment. Anesthetic (In clinic) Topical Lidocaine 5% applied to wound bed Bathing/ Shower/ Hygiene May shower with protection but do not get wound dressing(s) wet. Protect dressing(s) with water repellant cover (for example, large  plastic bag) or a cast cover and may then take shower. [name], [name] ([id number])  [id number] Page 4 of 9 Edema Control - Lymphedema / SCD / Other Avoid standing for long periods of time. Exercise regularly Wound Treatment Wound #1 - Malleolus Wound Laterality: Right, Medial Cleanser: Normal Saline (Generic) 1 x Per Week/30 Days Discharge Instructions: Cleanse the wound with Normal Saline prior to applying a clean dressing using gauze sponges, not tissue or cotton balls. Cleanser: Soap and Water 1 x Per Week/30 Days Discharge Instructions: May shower and wash wound with dial antibacterial soap and water prior to dressing change. Peri-Wound Care: Sween Lotion (Moisturizing lotion) 1 x Per Week/30 Days Discharge Instructions: Apply moisturizing lotion as directed Topical: Gentamicin 1 x Per Week/30 Days Discharge Instructions: As directed by physician Prim Dressing: Sorbalgon AG Dressing 2x2 (in/in) 1 x Per Week/30 Days ary Discharge Instructions: Apply to wound bed as instructed Secondary Dressing: ABD Pad, 8x10 1 x Per Week/30 Days Discharge Instructions: Apply over primary dressing as directed. Compression Wrap: ThreePress (3 layer compression wrap) 1 x Per Week/30 Days Discharge Instructions: Apply three layer compression as directed. Patient Medications llergies: No Known Allergies A Notifications Medication Indication Start End 01/13/2022 levofloxacin DOSE oral 750 mg tablet - 1 tab p.o. daily x 10 days Electronic Signature(s) Signed: 01/13/2022 12:13:53 PM By: Fredirick Maudlin MD FACS Previous Signature: 01/13/2022 9:51:01 AM Version By: Fredirick Maudlin MD FACS Entered By: Fredirick Maudlin on 01/13/2022 09:51:11 -------------------------------------------------------------------------------- Problem List Details Patient Name: Date of Service: [name], [name]. 01/13/2022 8:00 A M Medical Record Number: [id number] Patient Account Number: [id number] Date of Birth/Sex: Treating RN: 02-19-1940 (81 y.o. 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 01/13/2022 8:00 A M Medical Record Number: [id number] Patient Account Number: [id number] Date of Birth/Sex: Treating RN: 1940-02-06 (81 y.o. F) Primary Care Provider: [name] Other Clinician: Referring Provider: Treating Provider/Extender: Venancio Poisson Weeks in Treatment: 12 Subjective Chief Complaint Information obtained from Patient Right ankle ulcer History of Present Illness (HPI) 10-21-2021 upon evaluation today patient appears to be doing poorly in regard to the wound over the medial aspect of her right ankle. This is an area that previously she has had trouble with previously. I have actually seen her in [place] about a year ago but this was on the left ankle at that time. 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 This is also the typical spot for her as it is a very difficult area to compress. Nonetheless previously we have been able to get this under control using her compression socks as well  as Xeroform gauze although I am not sure that is can be the best thing for her at this time we discussed that as we proceed. Patient does have chronic venous  hypertension and a history of vasculitis but otherwise no major medical problems. 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 Nonetheless I do think that we could potentially try some oral steroids, prednisone, to see if we can help calm this down. She is not opposed to this at all. In fact she would like to do anything she can to try to get better she tells me the Achilles area has been very painful at times for her. I really do not see any signs of infection right now but that symptomatic = as well. 10/18; patient with wounds looks a lot better today measuring smaller. She went to her grandsons wedding this weekend did not wear compression wraps. She completed her course of prednisone 50 mg we have been using TCA and Hydrofera Blue on the wound area. 11-11-2021 upon evaluation today patient appears to be doing well currently in regard to her wound in fact she is showing signs of good granulation and epithelization at this point and I am very pleased with where we stand currently. I do not see any signs of infection locally or systemically which is great news. No fevers, chills, nausea, vomiting, or diarrhea. I do believe that the anti-inflammatories did do well for her. 11-18-2021 upon evaluation today patient's wound is actually showing signs of excellent improvement and very pleased with where we were seeing this improvement thus far. I do not see any evidence of active infection locally or systemically which is great news and overall I do believe we are on the right track here. 11/8; patient comes in complaining of marked pain in this area. She takes  Tylenol for the discomfort. Unfortunately we are not making much progress in the condition of this wound still slough covered. 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 With that being said there does not appear to be any signs of infection which is good news but the wound does not appear to be quite as healthy as what I saw the last time I saw her in 2 weeks and looking back at pictures I really feel like that there were very small compared to where we started there was a little bit of discoloration of the base of the wound that was not there last week. Some of this was probably just due to bleeding which is understandable from and following debridement but at the same time I do believe that nonetheless she still would benefit from the compression wrapping currently. She is definitely more swollen today than she was previous and she realizes this as well. 12-09-2021 upon evaluation today patient appears to be doing a little better in regard to her wound compared to last time although size wise is not much different it does seem to be improving with regard to the necrotic tissue that is turning more yellow and loosening up which is at least good news. With that being said I do believe that the patient would benefit from likely switching to Santyl this is good to me and we cannot wrap  her and she is getting need to actually change this more frequently at home using her compression sock but I think that it may be a better way to go at the moment to see if we can get this cleaned up she is in agreement with that plan. If we get to where we can use collagen then we will consider going back to the wrap. 12-23-2021  upon evaluation today patient's wound does have some necrotic tissue noted at this point and we are going to need and clean this away today. I discussed that with her. With that being said fortunately there does not appear to be any signs of infection locally or systemically which is great news and overall I am extremely pleased in that regard. Her husband is changing this daily and that does seem to be benefiting her at this point. The wound is a bit smaller today compared to previous evaluation. 01-06-2022 upon evaluation today patient unfortunately appears to be doing worse in regard to her wound. Actually got a call from her yesterday in [place], [place] [name] ([id number]) [id number] Page 6 of 9 because she told me that her foot and ankle area was doing much worse with increased pain. Fortunately I do not see any signs of infection systemically though locally I do's definitely see signs of cellulitis. I discussed that with her today. Nonetheless I do believe that she would benefit from continuation of the antibiotics which I placed her on yesterday. This includes the Bactrim DS although we may need to add something different depend on what the results of the culture come back showing. We are going to do a PCR culture today. Subsequently I am also going to go ahead and give the patient gentamicin cream which I am going to send into the pharmacy for her at this point. 01/13/2022: The culture that was taken last week returned positive for Pseudomonas, which certainly would not be covered by the Bactrim that was prescribed. She is having more pain in the wound and continues to have blue-green drainage on her dressing. There is substantial slough within the wound. Patient History Information obtained from Patient, Chart. Family History Unknown History. Social History Never smoker, Marital Status - Married, Alcohol Use - Never, Drug Use - No History, Caffeine Use -  Moderate. Medical History Eyes Denies history of Cataracts, Glaucoma, Optic Neuritis Cardiovascular Patient has history of Peripheral Venous Disease Medical A Surgical History Notes nd Cardiovascular hyperlipidemia Endocrine hypothyroidism Objective Constitutional She is hypertensive, but asymptomatic.[name] No acute distress. Vitals Time Taken: 8:54 AM, Temperature: 98.2 F, Pulse: 87 bpm, Respiratory Rate: 20 breaths/min, Blood Pressure: 152/80 mmHg. Respiratory Normal work of breathing on room air. General Notes: 01/13/2022: There is thick slough filling the wound. There is periwound erythema and the patient complains of significant tenderness. Integumentary (Hair, Skin) Wound #1 status is Open. Original cause of wound was Gradually Appeared. The date acquired was: 09/30/2021. The wound has been in treatment 12 weeks. The wound is located on the Right,Medial Malleolus. The wound measures 1.5cm length x 1.2cm width x 0.3cm depth; 1.414cm^2 area and 0.424cm^3 volume. There is Fat Layer (Subcutaneous Tissue) exposed. There is no tunneling or undermining noted. There is a medium amount of purulent drainage noted. The wound margin is distinct with the outline attached to the wound base. There is no granulation within the wound bed. There is a large (67-100%) amount of necrotic tissue within the wound bed including Adherent Slough. The periwound skin appearance exhibited: Erythema. The  periwound skin appearance did not exhibit: Callus, Crepitus, Excoriation, Induration, Rash, Scarring, Dry/Scaly, Maceration, Atrophie Blanche, Cyanosis, Ecchymosis, Hemosiderin Staining, Mottled, Pallor, Rubor. The surrounding wound skin color is noted with erythema which is circumferential. Periwound temperature was noted as No Abnormality. The periwound has tenderness on palpation. 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 There was a Selective/Open Wound Non-Viable Tissue Debridement with a total area of 1.8 sq cm performed by Fredirick Maudlin, MD. With the following instrument(s): Curette to remove Non-Viable tissue/material. Material removed includes [facility] after achieving pain control using Lidocaine 4% Topical Solution. ONESTY, CLAIR ([id number]) [id number] Page 7 of 9 A time out was conducted at 09:15, prior to the start of the procedure. A Minimum amount of bleeding was controlled with Pressure. The procedure was tolerated well with a pain level of 0 throughout and a pain level of 3 following the procedure. Post Debridement Measurements: 1.5cm length x 1.2cm width x 0.3cm depth; 0.424cm^3 volume. Character of Wound/Ulcer Post Debridement is improved. Severity of Tissue Post Debridement is: Fat layer exposed. Post procedure Diagnosis Wound #1: Same as Pre-Procedure Plan Follow-up Appointments: Return Appointment in 1 week. Margarita Grizzle on Wednesday Return Appointment in 2 weeks. Burman Blacksmith on Wednesday's Other: - pick up levaquin at pharmacy. Continue the gentamicin ointment. Anesthetic: (In clinic) Topical Lidocaine 5% applied to wound bed Bathing/ Shower/ Hygiene: May shower with protection but do not get wound dressing(s) wet. Protect dressing(s) with water repellant cover (for example, large plastic bag) or a cast cover and may then take shower. Edema Control - Lymphedema / SCD / Other: Avoid standing for long periods of time. Exercise regularly The following medication(s) was prescribed: levofloxacin oral 750 mg tablet 1 tab p.o. daily x 10 days starting 01/13/2022 WOUND #1: - Malleolus Wound Laterality: Right,  Medial Cleanser: Normal Saline (Generic) 1 x Per Week/30 Days Discharge Instructions: Cleanse the wound with Normal Saline prior to applying a clean dressing using gauze sponges, not tissue or cotton balls. Cleanser: Soap and Water 1 x Per Week/30 Days Discharge Instructions: May shower and wash wound with dial antibacterial soap and water prior to dressing change. Peri-Wound Care: Sween Lotion (Moisturizing lotion) 1 x Per Week/30 Days Discharge Instructions: Apply moisturizing lotion as directed Topical: Gentamicin 1 x Per Week/30 Days Discharge Instructions: As directed by physician Prim Dressing: Sorbalgon AG Dressing 2x2 (in/in) 1 x Per Week/30 Days ary Discharge Instructions: Apply to wound bed as instructed Secondary Dressing: ABD Pad, 8x10 1 x Per Week/30 Days Discharge Instructions: Apply over primary dressing as directed. Com pression Wrap: ThreePress (3 layer compression wrap) 1 x Per Week/30 Days Discharge Instructions: Apply three layer compression as directed. 01/13/2022: The patient has substantial slough within the wound along with blue-green drainage. The wound is more tender. Her culture returned positive for Pseudomonas, which would not be covered by the Bactrim she was prescribed. I used a curette to debride a thick layer of slough from her wound. We will continue topical gentamicin but I am going to add silver alginate and 3 layer compression. I also sent a prescription for levofloxacin to her pharmacy. She will follow-up with Jeri Cos, PA-C next week. Electronic Signature(s) Signed: 01/13/2022 9:52:19 AM By: Celine Ahr,  Anderson Malta MD FACS Entered By: Fredirick Maudlin on 01/13/2022 09:52:19 -------------------------------------------------------------------------------- HxROS Details Patient Name: Date of Service: Thora Lance [name]. 01/13/2022 8:00 A M Medical Record Number: [id number] Patient Account Number: [id number] Date of Birth/Sex: Treating RN: 02-Apr-1940 (81 y.o.  F) Primary Care Provider: [name] Other Clinician: Referring Provider: Treating Provider/Extender: Venancio Poisson Weeks in Treatment: 12 Information Obtained From Patient Chart Eyes Medical History: Negative for: Cataracts; Glaucoma; Optic Neuritis ERILYN, PEARMAN [name] ([id number]) [id number] Page 8 of 9 Cardiovascular Medical History: Positive for: Peripheral Venous Disease Past Medical History Notes: hyperlipidemia Endocrine Medical History: Past Medical History Notes: hypothyroidism Immunizations Pneumococcal Vaccine: Received Pneumococcal Vaccination: Yes Received Pneumococcal Vaccination On or After 60th Birthday: Yes Implantable Devices None Family and Social History Unknown History: Yes; Never smoker; Marital Status - Married; Alcohol Use: Never; Drug Use: No History; Caffeine Use: Moderate; Financial Concerns: No; Food, Clothing or Shelter Needs: No; Support System Lacking: No; Transportation Concerns: No Electronic Signature(s) Signed: 01/13/2022 12:13:53 PM By: Fredirick Maudlin MD FACS Entered By: Fredirick Maudlin on 01/13/2022 09:48:41 -------------------------------------------------------------------------------- SuperBill Details Patient Name: Date of Service: [name], Lorean [name]. 01/13/2022 Medical Record Number: [id number] Patient Account Number: [id number] Date of Birth/Sex: Treating RN: Sep 17, 1940 (81 y.o. 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 Facility Procedures : CPT4 Code: [id number] Description:  28366 - DEBRIDE WOUND 1ST 20 SQ CM OR < ICD-10 Diagnosis Description L97.312 Non-pressure chronic ulcer of right ankle with fat layer exposed Modifier: Quantity: 1 Physician Procedures : CPT4 Code Description Modifier [id number] - WC PHYS LEVEL 4 - EST PT 25 ICD-10 Diagnosis Description L97.312 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

## 2022-01-20 ENCOUNTER — Ambulatory Visit
Admission: RE | Admit: 2022-01-20 | Discharge: 2022-01-20 | Disposition: A | Discharge status: Home / Self Care | Payer: Medicare Other | Source: Ambulatory Visit | Attending: Physician Assistant | Admitting: Physician Assistant

## 2022-01-20 ENCOUNTER — Encounter (HOSPITAL_BASED_OUTPATIENT_CLINIC_OR_DEPARTMENT_OTHER): Payer: Medicare Other | Attending: Physician Assistant | Admitting: Physician Assistant

## 2022-01-20 ENCOUNTER — Other Ambulatory Visit: Payer: Self-pay | Admitting: Physician Assistant

## 2022-01-20 DIAGNOSIS — L97312 Non-pressure chronic ulcer of right ankle with fat layer exposed: Secondary | ICD-10-CM | POA: Insufficient documentation

## 2022-01-20 DIAGNOSIS — S81801A Unspecified open wound, right lower leg, initial encounter: Secondary | ICD-10-CM

## 2022-01-20 DIAGNOSIS — I776 Arteritis, unspecified: Secondary | ICD-10-CM | POA: Insufficient documentation

## 2022-01-20 DIAGNOSIS — L97319 Non-pressure chronic ulcer of right ankle with unspecified severity: Secondary | ICD-10-CM | POA: Diagnosis not present

## 2022-01-20 DIAGNOSIS — I87331 Chronic venous hypertension (idiopathic) with ulcer and inflammation of right lower extremity: Secondary | ICD-10-CM | POA: Diagnosis not present

## 2022-01-20 DIAGNOSIS — M19071 Primary osteoarthritis, right ankle and foot: Secondary | ICD-10-CM | POA: Diagnosis not present

## 2022-01-20 NOTE — Progress Notes (Signed)
[name], [name] ([id number]) [id number] Page 1 of 7 Visit Report for 01/20/2022 Chief Complaint Document Details Patient Name: Date of Service: [name], [name] 01/20/2022 9:15 A M Medical Record Number: [id number] Patient Account Number: [id number] Date of Birth/Sex: Treating RN: 30-Jul-1940 (82 y.o. F) Primary Care Provider: [name] Other Clinician: Referring Provider: Treating Provider/Extender: [name] Weeks in Treatment: 13 Information Obtained from: Patient Chief Complaint Right ankle ulcer Electronic Signature(s) Signed: 01/20/2022 9:10:41 AM By: [name] PA-C Entered By: [name] on 01/20/2022 09:10:40 -------------------------------------------------------------------------------- HPI Details Patient Name: Date of Service: [name], [name]. 01/20/2022 9:15 A M Medical Record Number: [id number] Patient Account Number: [id number] Date of Birth/Sex: Treating RN: 1940/03/27 (82 y.o. F) Primary Care Provider: [name] Other Clinician: Referring Provider: Treating Provider/Extender: [name] Weeks in Treatment: 13 History of Present Illness HPI Description: 10-21-2021 upon evaluation today patient appears to be doing poorly in regard to the wound over the medial aspect of her right ankle. This is an area that previously she has had trouble with previously. I have actually seen her in [place] about a year ago but this was on the left ankle at that time. 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 This is also the typical spot for her as it is a very difficult area to compress. Nonetheless previously we have been able to get this under control using her compression socks as well as Xeroform gauze although I am not sure that is can be the best thing for her at this time we discussed that as we  proceed. 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 Nonetheless I do think that we could potentially try some oral steroids, prednisone, to see if we can help calm this down. She is not opposed to this at all. In fact she would like to do anything she can to try to get better she tells me the Achilles area has been very painful at times for her. I really do not see any signs of infection right now but that symptomatic = as well. 10/18; patient with wounds looks a lot better today measuring smaller. She went to her grandsons wedding this weekend did not wear compression wraps. She completed her course of prednisone 50 mg we have been using TCA and Hydrofera Blue on the wound area. 11-11-2021 upon evaluation today patient appears to be doing well currently in regard to her wound in fact she is showing signs of good granulation and epithelization at this point and I am very pleased with where we stand currently. I do not see any signs of infection locally or systemically which is great news. No fevers, chills, nausea, vomiting, or diarrhea. I do believe that the anti-inflammatories did do well for her. 11-18-2021 upon evaluation today patient's wound is actually showing signs of excellent improvement and very pleased with where we were seeing this improvement thus far. I do not see any evidence of active infection locally or systemically which is great news and overall I do believe we are on the  right track here. 11/8; patient comes in complaining of marked pain in this area. She takes Tylenol for the discomfort. Unfortunately we are not making much progress in the condition of this wound still slough  covered. 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 With that being said there does not appear to be any signs of infection which is good news but the wound does not appear to be quite as healthy as what I saw the last time I saw her in 2 weeks and looking back at pictures I really feel like that there were very small compared to where we started there was a little bit of discoloration of the base of the wound that was not there last [name], [name] ([id number]) [id number] Page 2 of 7 week. Some of this was probably just due to bleeding which is understandable from and following debridement but at the same time I do believe that nonetheless she still would benefit from the compression wrapping currently. She is definitely more swollen today than she was previous and she realizes this as well. 12-09-2021 upon evaluation today patient appears to be doing a little better in regard to her wound compared to last time although size wise is not much different it does seem to be improving with regard to the necrotic tissue that is turning more yellow and loosening up which is at least good news. With that being said I do believe that the patient would benefit from likely switching to Santyl this is good to me and we cannot wrap her and she is getting need to actually change this more frequently at home using her compression sock but I think that it may be a better way to go at the moment to see if we can get this cleaned up she is in agreement with that plan. If we get to where we can use collagen then we will consider going back to the wrap. 12-23-2021 upon evaluation today patient's wound  does have some necrotic tissue noted at this point and we are going to need and clean this away today. I discussed that with her. With that being said fortunately there does not appear to be any signs of infection locally or systemically which is great news and overall I am extremely pleased in that regard. Her husband is changing this daily and that does seem to be benefiting her at this point. The wound is a bit smaller today compared to previous evaluation. 01-06-2022 upon evaluation today patient unfortunately appears to be doing worse in regard to her wound. Actually got a call from her yesterday in [place] because she told me that her foot and ankle area was doing much worse with increased pain. Fortunately I do not see any signs of infection systemically though locally I do's definitely see signs of cellulitis. I discussed that with her today. Nonetheless I do believe that she would benefit from continuation of the antibiotics which I placed her on yesterday. This includes the Bactrim DS although we may need to add something different depend on what the results of the culture come back showing. We are going to do a PCR culture today. Subsequently I am also going to go ahead and give the patient  gentamicin cream which I am going to send into the pharmacy for her at this point. 01/13/2022: The culture that was taken last week returned positive for Pseudomonas, which certainly would not be covered by the Bactrim that was prescribed. She is having more pain in the wound and continues to have blue-green drainage on her dressing. There is substantial slough within the wound. 01-20-2022 upon evaluation today patient appears to be doing well currently in regard to the her wound. This looks much better than last time I saw her. Fortunately she did get on the right antibiotic had to be switched to Levaquin actually came back positive that her culture showed Pseudomonas only. Subsequently since being on  the Levaquin and the wound has gotten much better she still has some burning but is not nearly as bad as what it was previous. Electronic Signature(s) Signed: 01/20/2022 10:29:13 AM By: [name] PA-C Entered By: [name] on 01/20/2022 10:29:13 -------------------------------------------------------------------------------- Physical Exam Details Patient Name: Date of Service: [name], [name]. 01/20/2022 9:15 A M Medical Record Number: [id number] Patient Account Number: [id number] Date of Birth/Sex: Treating RN: March 09, 1940 (82 y.o. F) Primary Care Provider: [name] Other Clinician: Referring Provider: Treating Provider/Extender: [name] Weeks in Treatment: 3 Constitutional Well-nourished and well-hydrated in no acute distress. Respiratory normal breathing without difficulty. Psychiatric this patient is able to make decisions and demonstrates good insight into disease process. Alert and Oriented x 3. pleasant and cooperative. Notes Patient's wound does look significantly improved compared to last time I saw her which is great news. She still does have quite a bit of discomfort but again I am pleased with the fact that the erythema and redness has cleared and this seems to be doing excellent in that regard. Electronic Signature(s) Signed: 01/20/2022 11:33:27 AM By: [name] PA-C Entered By: [name] on 01/20/2022 11:33:27 -------------------------------------------------------------------------------- Physician Orders Details Patient Name: Date of Service: [name], [name]. 01/20/2022 9:15 A M Medical Record Number: [id number] Patient Account Number: [id number] AZHANE, ECKART ([id number]) [id number] Page 3 of 7 Date of Birth/Sex: Treating RN: 1940/08/12 (82 y.o. 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 Margarita Grizzle on Wednesday room 8 Return A ppointment in 2 weeks. Burman Blacksmith on Wednesday's room 8 Return A Other: - finish up oral antibiotics. Have x-ray performed before next week appt time. Anesthetic (In clinic) Topical Lidocaine 5% applied to wound bed Bathing/ Shower/ Hygiene May shower with protection but do not get wound dressing(s) wet. Protect dressing(s) with water repellant cover (for example, large plastic bag) or a cast cover and may then take shower. Edema Control - Lymphedema / SCD / Other Elevate legs to the level of the heart or above for 30 minutes daily and/or when sitting for 3-4 times a day throughout the day. Avoid standing for long periods of time. Exercise regularly Wound Treatment Wound #1 - Malleolus Wound Laterality: Right, Medial Cleanser: Normal Saline (Generic) 1 x Per Week/30 Days Discharge Instructions: Cleanse the wound with Normal Saline prior to applying a clean dressing using gauze sponges, not tissue  or cotton balls. Cleanser: Soap and Water 1 x Per Week/30 Days Discharge Instructions: May shower and wash wound with dial antibacterial soap and water prior to dressing change. Peri-Wound Care: Sween Lotion (Moisturizing lotion) 1 x Per Week/30 Days Discharge Instructions: Apply moisturizing lotion as directed Topical: Gentamicin 1 x Per Week/30 Days Discharge Instructions: As directed by physician Prim Dressing: Sorbalgon AG Dressing 2x2 (in/in) 1 x Per Week/30 Days ary Discharge Instructions: Apply to wound bed as instructed Secondary Dressing: ABD Pad, 8x10 1 x Per Week/30  Days Discharge Instructions: Apply over primary dressing as directed. Compression Wrap: ThreePress (3 layer compression wrap) 1 x Per Week/30 Days Discharge Instructions: Apply three layer compression as directed. Radiology X-ray, ankle right - x-ray of right ankle related to non-healing ulcer looking for infection. CPT code - (ICD10 L97.312 - Non-pressure chronic ulcer of right ankle with fat layer exposed) Electronic Signature(s) Signed: 01/20/2022 4:16:28 PM By: [name] PA-C Signed: 01/20/2022 5:47:40 PM By: Deon Pilling RN, BSN Entered By: Deon Pilling on 01/20/2022 10:06:23 [name] ([id number]) [id number] Page 4 of 7 Prescription 01/20/2022 -------------------------------------------------------------------------------- Yerkes, Leesa [name]. [name] [place] Patient Name: Provider: July 08, 1940 [id number] Date of Birth: NPI#: [id number] Sex: DEA #: [phone] Phone #: License #: [place] Patient Address: [facility] [address] [place], [place] [address] [place], [place] [address] [phone] Allergies No Known Allergies Provider's Orders X-ray, ankle right - ICD10: L97.312 - x-ray of right ankle related to non-healing ulcer looking for infection. CPT code Hand Signature: Date(s): Electronic Signature(s) Signed: 01/20/2022 4:16:28 PM By: [name] PA-C Signed: 01/20/2022 5:47:40 PM By: Deon Pilling RN, BSN Entered By: Deon Pilling on 01/20/2022 10:06:23 -------------------------------------------------------------------------------- Problem List Details Patient Name: Date of Service: [name], Aarohi [name]. 01/20/2022 9:15 A M Medical Record Number: [id number] Patient Account Number: [id number] Date of Birth/Sex: Treating RN: 17-Jun-1940 (82 y.o. 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 10/28/2021 No Yes I87.331 Chronic venous hypertension (idiopathic) with ulcer and inflammation of right 10/21/2021 No Yes lower extremity Inactive Problems Resolved Problems Electronic Signature(s) Signed: 01/20/2022 9:10:31 AM By: [name] PA-C Entered By: [name] on 01/20/2022 09:10:31 [name], [name] ([id number]) [id number] Page 5 of 7 -------------------------------------------------------------------------------- Progress Note Details Patient Name: Date of Service: [name], [name] 01/20/2022 9:15 A M Medical Record Number: [id number] Patient Account Number: [id number] Date of Birth/Sex: Treating RN: 1940/12/11 (82 y.o. F) Primary Care Provider: [name] Other Clinician: Referring Provider: Treating Provider/Extender: [name] Weeks in Treatment: 13 Subjective Chief Complaint Information obtained from Patient Right ankle ulcer History of Present Illness (HPI) 10-21-2021 upon evaluation today patient appears to be doing poorly in regard to the wound over the medial aspect of her right ankle. This is an area that previously she has had trouble with previously. I have actually seen her in [place] about a year ago but this was on the left ankle at that time. 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 This is also the typical spot for her as it is a very difficult  area to compress. Nonetheless previously we have been able to get this under control using her compression socks as well as Xeroform gauze although I am not sure that is can be the best thing for her at this time we discussed that  as we proceed. 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 Nonetheless I do think that we could potentially try some oral steroids, prednisone, to see if we can help calm this down. She is not opposed to this at all. In fact she would like to do anything she can to try to get better she tells me the Achilles area has been very painful at times for her. I really do not see any signs of infection right now but that symptomatic = as well. 10/18; patient with wounds looks a lot better today measuring smaller. She went to her grandsons wedding this weekend did not wear compression wraps. She completed her course of prednisone 50 mg we have been using TCA and Hydrofera Blue on the wound area. 11-11-2021 upon evaluation today patient appears to be doing well currently in regard to her wound in fact she is showing signs of good granulation and epithelization at this point and I am very pleased with where we stand currently. I do not see any signs of infection locally or systemically which is great news. No fevers, chills, nausea, vomiting, or diarrhea. I do believe that the anti-inflammatories did do well for her. 11-18-2021 upon evaluation today patient's wound is actually showing signs of excellent improvement and very pleased with where we were seeing this improvement thus far. I do not see any evidence of active infection locally or systemically which is great news and overall I do believe we are on the right track here. 11/8; patient comes in complaining of marked pain in this area. She takes Tylenol for the discomfort. Unfortunately we are not making much progress in the condition of this wound still  slough covered. 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 With that being said there does not appear to be any signs of infection which is good news but the wound does not appear to be quite as healthy as what I saw the last time I saw her in 2 weeks and looking back at pictures I really feel like that there were very small compared to where we started there was a little bit of discoloration of the base of the wound that was not there last week. Some of this was probably just due to bleeding which is understandable from and following debridement but at the same time I do believe that nonetheless she still would benefit from the compression wrapping currently. She is definitely more swollen today than she was previous and she realizes this as well. 12-09-2021 upon evaluation today patient appears to be doing a little better in regard to her wound compared to last time although size wise is not much different  it does seem to be improving with regard to the necrotic tissue that is turning more yellow and loosening up which is at least good news. With that being said I do believe that the patient would benefit from likely switching to Santyl this is good to me and we cannot wrap her and she is getting need to actually change this more frequently at home using her compression sock but I think that it may be a better way to go at the moment to see if we can get this cleaned up she is in agreement with that plan. If we get to where we can use collagen then we will consider going back to the wrap. 12-23-2021 upon evaluation today patient's wound does have some necrotic tissue noted at this point and we are going to need  and clean this away today. I discussed that with her. With that being said fortunately there does not appear to be any signs of infection locally or systemically which is great news and overall I am extremely pleased in that regard. Her husband is changing this daily and that does seem to be benefiting her at this point. The wound is a bit smaller today compared to previous evaluation. 01-06-2022 upon evaluation today patient unfortunately appears to be doing worse in regard to her wound. Actually got a call from her yesterday in [place] because she told me that her foot and ankle area was doing much worse with increased pain. Fortunately I do not see any signs of infection systemically though locally I do's definitely see signs of cellulitis. I discussed that with her today. Nonetheless I do believe that she would benefit from continuation of the antibiotics which I placed her on yesterday. This includes the Bactrim DS although we may need to add something different depend on what the results of the culture come back showing. We are going to do a PCR culture today. Subsequently I am also going to go ahead and give the patient gentamicin cream which I am going to send into the pharmacy for her at this point. 01/13/2022: The culture that was taken last week returned positive for Pseudomonas, which certainly would not be covered by the Bactrim that was prescribed. She is having more pain in the wound and continues to have blue-green drainage on her dressing. There is substantial slough within the wound. 01-20-2022 upon evaluation today patient appears to be doing well currently in regard to the her wound. This looks much better than last time I saw her. Fortunately she did get on the right antibiotic had to be switched to Levaquin actually came back positive that her culture showed Pseudomonas only. Subsequently since being on the Levaquin and the wound has gotten much better she still has some burning  but is not nearly as bad as what it was previous. [name], [name] ([id number]) [id number] Page 6 of 7 Objective Constitutional Well-nourished and well-hydrated in no acute distress. Vitals Time Taken: 9:07 AM, Temperature: 97.8 F, Pulse: 80 bpm, Respiratory Rate: 18 breaths/min, Blood Pressure: 164/72 mmHg. Respiratory normal breathing without difficulty. Psychiatric this patient is able to make decisions and demonstrates good insight into disease process. Alert and Oriented x 3. pleasant and cooperative. General Notes: Patient's wound does look significantly improved compared to last time I saw her which is great news. She still does have quite a bit of discomfort but again I am pleased with the fact that the erythema and redness has cleared and this seems to be doing  excellent in that regard. Integumentary (Hair, Skin) Wound #1 status is Open. Original cause of wound was Gradually Appeared. The date acquired was: 09/30/2021. The wound has been in treatment 13 weeks. The wound is located on the Right,Medial Malleolus. The wound measures 1.5cm length x 1.5cm width x 0.4cm depth; 1.767cm^2 area and 0.707cm^3 volume. There is Fat Layer (Subcutaneous Tissue) exposed. There is a medium amount of purulent drainage noted. The wound margin is distinct with the outline attached to the wound base. There is no granulation within the wound bed. There is a large (67-100%) amount of necrotic tissue within the wound bed including Adherent Slough. The periwound skin appearance exhibited: Erythema. The periwound skin appearance did not exhibit: Callus, Crepitus, Excoriation, Induration, Rash, Scarring, Dry/Scaly, Maceration, Atrophie Blanche, Cyanosis, Ecchymosis, Hemosiderin Staining, Mottled, Pallor, Rubor. The surrounding wound skin color is noted with erythema which is circumferential. Periwound temperature was noted as No Abnormality. The periwound has tenderness on  palpation. 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 There was a Three Layer Compression Therapy Procedure by Erenest Blank. Post procedure Diagnosis Wound #1: Same as Pre-Procedure Plan Follow-up Appointments: Return Appointment in 1 week. Margarita Grizzle on Wednesday room 8 Return Appointment in 2 weeks. Burman Blacksmith on Wednesday's room 8 Other: - finish up oral antibiotics. Have x-ray performed before next week appt time. Anesthetic: (In clinic) Topical Lidocaine 5% applied to wound bed Bathing/ Shower/ Hygiene: May shower with protection but do not get wound dressing(s) wet. Protect dressing(s) with water repellant cover (for example, large plastic bag) or a cast cover and may then take shower. Edema Control - Lymphedema / SCD / Other: Elevate legs to the level of the heart or above for 30 minutes daily and/or when sitting for 3-4 times a day throughout the day. Avoid standing for long periods of time. Exercise regularly Radiology ordered were: X-ray, ankle right - x-ray of right ankle related to non-healing ulcer looking for infection. CPT code WOUND #1: - Malleolus Wound Laterality: Right, Medial Cleanser: Normal Saline (Generic) 1 x Per Week/30 Days Discharge Instructions: Cleanse the wound with Normal Saline prior to applying a clean dressing using gauze sponges, not tissue or cotton balls. Cleanser: Soap and Water 1 x Per Week/30 Days Discharge Instructions: May shower and wash wound with dial antibacterial soap and water prior to dressing change. Peri-Wound Care: Sween Lotion (Moisturizing lotion) 1 x Per Week/30 Days Discharge Instructions: Apply moisturizing lotion as directed Topical: Gentamicin  1 x Per Week/30 Days Discharge Instructions: As directed by physician Prim Dressing: Sorbalgon AG Dressing 2x2 (in/in) 1 x Per Week/30 Days ary Discharge Instructions: Apply to wound bed as instructed [name], [name] ([id number]) [id number] Page 7 of 7 Secondary Dressing: ABD Pad, 8x10 1 x Per Week/30 Days Discharge Instructions: Apply over primary dressing as directed. Compression Wrap: ThreePress (3 layer compression wrap) 1 x Per Week/30 Days Discharge Instructions: Apply three layer compression as directed. 1. I am going to suggest that we have the patient continue to monitor for any signs of infection or worsening. Based on what I am seeing I feel like that she is making really good progress here and I am very pleased in that regard. 2. I am good recommend as well that the patient should continue  to monitor for any signs of infection or worsening obviously if anything changes she knows to contact the office and let me know. Right now we do have an wrap so increased pain beyond what she is used to would be what she is looking out for in that regard. We will see patient back for reevaluation in 1 week here in the clinic. If anything worsens or changes patient will contact our office for additional recommendations. Electronic Signature(s) Signed: 01/20/2022 11:34:03 AM By: [name] PA-C Entered By: [name] on 01/20/2022 11:34:03 -------------------------------------------------------------------------------- SuperBill Details Patient Name: Date of Service: Kim Lance [name]. 01/20/2022 Medical Record Number: [id number] Patient Account Number: [id number] Date of Birth/Sex: Treating RN: Jun 14, 1940 (82 y.o. 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 Facility Procedures : CPT4 Code: [id number] Description: (Facility Use Only) [phone] - [facility] LWR RT LEG Modifier: Quantity: 1 Physician Procedures : CPT4 Code Description Modifier [id number] - WC PHYS LEVEL 3 - EST PT ICD-10 Diagnosis Description J69.678 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

## 2022-01-21 NOTE — Progress Notes (Signed)
CHRISSIE, DACQUISTO ([id number]) [id number] Page 1 of 6 Visit Report for 01/20/2022 Arrival Information Details Patient Name: Date of Service: TATIANA, COURTER 01/20/2022 9:15 A M Medical Record Number: [id number] Patient Account Number: [id number] Date of Birth/Sex: Treating RN: Aug 30, 1940 (82 y.o. F) Primary Care Hosam Mcfetridge: [name] Other Clinician: Referring Laure Leone: Treating Tamma Brigandi/Extender: [name] Weeks in Treatment: 13 Visit Information History Since Last Visit Added or deleted any medications: No Patient Arrived: Ambulatory Any new allergies or adverse reactions: No Arrival Time: 09:05 Had a fall or experienced change in No Accompanied By: husband activities of daily living that may affect Transfer Assistance: None risk of falls: Patient Identification Verified: Yes Signs or symptoms of abuse/neglect since last visito No Secondary Verification Process Completed: Yes Hospitalized since last visit: No Patient Requires Transmission-Based Precautions: No Implantable device outside of the clinic excluding No Patient Has Alerts: No cellular tissue based products placed in the center since last visit: Has Compression in Place as Prescribed: Yes Pain Present Now: Yes Electronic Signature(s) Signed: 01/20/2022 4:38:21 PM By: Erenest Blank Entered By: Erenest Blank on 01/20/2022 09:07:32 -------------------------------------------------------------------------------- Compression Therapy Details Patient Name: Date of Service: [name], Jamyria T. 01/20/2022 9:15 A M Medical Record Number: [id number] Patient Account Number: [id number] Date of Birth/Sex: Treating RN: 01-08-1941 (82 y.o. [name], Tammi Klippel Primary Care Katesha Eichel: [name] Other Clinician: Referring Dagny Fiorentino: Treating Eulon Allnutt/Extender: [name] Weeks in Treatment: 13 Compression Therapy Performed for Wound  Assessment: Wound #1 Right,Medial Malleolus Performed By: Clinician Erenest Blank, Compression Type: Three Layer Post Procedure Diagnosis Same as Pre-procedure Electronic Signature(s) Signed: 01/20/2022 5:47:40 PM By: Deon Pilling RN, BSN Entered By: Deon Pilling on 01/20/2022 10:04:13 Azucena Fallen T ([id number]) [id number] Page 2 of 6 -------------------------------------------------------------------------------- Encounter Discharge Information Details Patient Name: Date of Service: REETA, KUK 01/20/2022 9:15 A M Medical Record Number: [id number] Patient Account Number: [id number] Date of Birth/Sex: Treating RN: 1940/11/25 (82 y.o. [name], Tammi Klippel Primary Care Shamarra Warda: [name] Other Clinician: Referring Huel Centola: Treating Jenee Spaugh/Extender: [name] Weeks in Treatment: 13 Encounter Discharge Information Items Discharge Condition: Stable Ambulatory Status: Ambulatory Discharge Destination: Home Transportation: Private Auto Accompanied By: husband Schedule Follow-up Appointment: Yes Clinical Summary of Care: Electronic Signature(s) Signed: 01/20/2022 5:47:40 PM By: Deon Pilling RN, BSN Entered By: Deon Pilling on 01/20/2022 10:04:54 -------------------------------------------------------------------------------- Lower Extremity Assessment Details Patient Name: Date of Service: Thora Lance T. 01/20/2022 9:15 A M Medical Record Number: [id number] Patient Account Number: [id number] Date of Birth/Sex: Treating RN: 01/01/1941 (82 y.o. F) Primary Care Hodaya Curto: [name] Other Clinician: Referring Norfleet Capers: Treating Makiah Foye/Extender: [name] Weeks in Treatment: 13 Edema Assessment Assessed: [name]: No] Patrice Paradise: No] Edema: [Left: N] [Right: o] Calf Left: Right: Point of Measurement: 37 cm From Medial Instep 40 cm Ankle Left: Right: Point of Measurement: 8 cm  From Medial Instep 22 cm Electronic Signature(s) Signed: 01/20/2022 4:38:21 PM By: Erenest Blank Entered By: Erenest Blank on 01/20/2022 09:14:02 -------------------------------------------------------------------------------- Multi-Disciplinary Care Plan Details Patient Name: Date of Service: [name], Johnnye T. 01/20/2022 9:15 A M Medical Record Number: [id number] Patient Account Number: [id number] Date of Birth/Sex: Treating RN: [birth year] (82 y.o. [name], Tammi Klippel Primary Care Sharlyn Odonnel: [name] Other Clinician: Referring Cantrell Larouche: Treating Dannon Perlow/Extender: [name] Weeks in Treatment: [address] Natashia T ([id number]) [id number] Page 3 of 6 Active Inactive Wound/Skin Impairment Nursing  Diagnoses: Impaired tissue integrity Knowledge deficit related to smoking impact on wound healing Goals: Patient will have a decrease in wound volume by X% from date: (specify in notes) Date Initiated: 10/21/2021 Target Resolution Date: 03/12/2022 Goal Status: Active Patient/caregiver will verbalize understanding of skin care regimen Date Initiated: 10/21/2021 Date Inactivated: 11/11/2021 Target Resolution Date: 11/21/2021 Goal Status: Met Ulcer/skin breakdown will have a volume reduction of 30% by week 4 Date Initiated: 10/21/2021 Date Inactivated: 01/13/2022 Target Resolution Date: 01/16/2022 Unmet Reason: see wound Goal Status: Unmet measurement. Interventions: Assess patient/caregiver ability to obtain necessary supplies Assess patient/caregiver ability to perform ulcer/skin care regimen upon admission and as needed Assess ulceration(s) every visit Notes: Electronic Signature(s) Signed: 01/20/2022 5:47:40 PM By: Deon Pilling RN, BSN Entered By: Deon Pilling on 01/20/2022 09:34:28 -------------------------------------------------------------------------------- Pain Assessment Details Patient Name: Date of Service: [name], Barbi T. 01/20/2022 9:15 A M Medical Record Number: [id number] Patient Account Number: [id number] Date of Birth/Sex: Treating RN: 03/16/1940 (82 y.o. F) Primary Care Damauri Minion: [name] Other Clinician: Referring Milt Coye: Treating Harjot Dibello/Extender: [name] Weeks in Treatment: 13 Active Problems Location of Pain Severity and Description of Pain Patient Has Paino Yes Site Locations Pain Location: Pain in Ulcers Rate the pain. Current Pain Level: 3 Pain Management and Medication Current Pain Management: ADDY, MCMANNIS ([id number]) [id number] Page 4 of 6 Electronic Signature(s) Signed: 01/20/2022 4:38:21 PM By: Erenest Blank Entered By: Erenest Blank on 01/20/2022 09:08:01 -------------------------------------------------------------------------------- Patient/Caregiver Education Details Patient Name: Date of Service: Denzil Hughes 1/3/2024andnbsp9:15 A M Medical Record Number: [id number] Patient Account Number: [id number] Date of Birth/Gender: Treating RN: March 26, 1940 (82 y.o. [name], Tammi Klippel Primary Care Physician: [name] Other Clinician: Referring Physician: Treating Physician/Extender: Karie Schwalbe in Treatment: 13 Education Assessment Education Provided To: Patient Education Topics Provided Wound/Skin Impairment: Handouts: Caring for Your Ulcer Methods: Explain/Verbal Responses: Reinforcements needed Electronic Signature(s) Signed: 01/20/2022 5:47:40 PM By: Deon Pilling RN, BSN Entered By: Deon Pilling on 01/20/2022 09:34:38 -------------------------------------------------------------------------------- Wound Assessment Details Patient Name: Date of Service: [name], Berdell T. 01/20/2022 9:15 A M Medical Record Number: [id number] Patient Account Number: [id number] Date of Birth/Sex: Treating RN: 04-Oct-1940 (82 y.o. F) Primary Care Medina Degraffenreid:  [name] Other Clinician: Referring Aliani Caccavale: Treating Ahmere Hemenway/Extender: [name] Weeks in Treatment: 13 Wound Status Wound Number: 1 Primary Etiology: Venous Leg Ulcer Wound Location: Right, Medial Malleolus Wound Status: Open Wounding Event: Gradually Appeared Comorbid History: Peripheral Venous Disease Date Acquired: 09/30/2021 Weeks Of Treatment: 13 Clustered Wound: No Photos RASHAD, OBEID T ([id number]) [id number] Page 5 of 6 Wound Measurements Length: (cm) 1.5 Width: (cm) 1.5 Depth: (cm) 0.4 Area: (cm) 1.767 Volume: (cm) 0.707 % Reduction in Area: 0% % Reduction in Volume: -100.3% Epithelialization: Medium (34-66%) Wound Description Classification: Full Thickness With Exposed Support Structures Wound Margin: Distinct, outline attached Exudate Amount: Medium Exudate Type: Purulent Exudate Color: yellow, brown, green Foul Odor After Cleansing: No Slough/Fibrino Yes Wound Bed Granulation Amount: None Present (0%) Exposed Structure Necrotic Amount: Large (67-100%) Fascia Exposed: No Necrotic Quality: Adherent Slough Fat Layer (Subcutaneous Tissue) Exposed: Yes Tendon Exposed: No Muscle Exposed: No Joint Exposed: No Bone Exposed: No Periwound Skin Texture Texture Color No Abnormalities Noted: No No Abnormalities Noted: No Callus: No Atrophie Blanche: No Crepitus: No Cyanosis: No Excoriation: No Ecchymosis: No Induration: No Erythema: Yes Rash: No Erythema Location: Circumferential Scarring: No Erythema Change: Decreased Hemosiderin Staining: No Moisture Mottled: No  No Abnormalities Noted: No Pallor: No Dry / Scaly: No Rubor: No Maceration: No Temperature / Pain Temperature: No Abnormality Tenderness on Palpation: Yes Treatment Notes Wound #1 (Malleolus) Wound Laterality: Right, Medial Cleanser Normal Saline Discharge Instruction: Cleanse the wound with Normal Saline prior to  applying a clean dressing using gauze sponges, not tissue or cotton balls. Soap and Water Discharge Instruction: May shower and wash wound with dial antibacterial soap and water prior to dressing change. Peri-Wound Care Sween Lotion (Moisturizing lotion) Discharge Instruction: Apply moisturizing lotion as directed Topical Gentamicin Discharge Instruction: As directed by physician Primary Dressing AMOY, STEEVES T ([id number]) [id number] Page 6 of 6 Sorbalgon AG Dressing 2x2 (in/in) Discharge Instruction: Apply to wound bed as instructed Secondary Dressing ABD Pad, 8x10 Discharge Instruction: Apply over primary dressing as directed. Secured With Compression Wrap ThreePress (3 layer compression wrap) Discharge Instruction: Apply three layer compression as directed. Compression Stockings Add-Ons Electronic Signature(s) Signed: 01/20/2022 4:38:21 PM By: Erenest Blank Entered By: Erenest Blank on 01/20/2022 09:17:22 -------------------------------------------------------------------------------- Vitals Details Patient Name: Date of Service: [name], Elasha T. 01/20/2022 9:15 A M Medical Record Number: [id number] Patient Account Number: [id number] Date of Birth/Sex: Treating RN: Nov 07, 1940 (82 y.o. F) Primary Care Tineshia Becraft: [name] Other Clinician: Referring Joshia Kitchings: Treating Cherene Dobbins/Extender: [name] Weeks in Treatment: 13 Vital Signs Time Taken: 09:07 Temperature (F): 97.8 Pulse (bpm): 80 Respiratory Rate (breaths/min): 18 Blood Pressure (mmHg): 164/72 Reference Range: 80 - 120 mg / dl Electronic Signature(s) Signed: 01/20/2022 4:38:21 PM By: Erenest Blank Entered By: Erenest Blank on 01/20/2022 09:07:52

## 2022-01-21 NOTE — Progress Notes (Signed)
Kim, [name] [name] ([id number]) [id number] Page 1 of 5 Visit Report for 12/23/2021 Arrival Information Details Patient Name: Date of Service: [name], [name] 12/23/2021 10:45 A M Medical Record Number: [id number] Patient Account Number: [id number] Date of Birth/Sex: Treating [name]: [name] (82 y.o. F) Primary Care [name]: [name] Other Clinician: Referring [name]: Treating [name]/Extender: [name] in Treatment: 9 Visit Information History Since Last Visit Added or deleted any medications: No Patient Arrived: Ambulatory Any new allergies or adverse reactions: No Arrival Time: 10:43 Had a fall or experienced change in No Accompanied By: self activities of daily living that may affect Transfer Assistance: None risk of falls: Patient Identification Verified: Yes Signs or symptoms of abuse/neglect since last visito No Secondary Verification Process Completed: Yes Hospitalized since last visit: No Patient Requires Transmission-Based Precautions: No Implantable device outside of the clinic excluding No Patient Has Alerts: No cellular tissue based products placed in the center since last visit: Has Dressing in Place as Prescribed: Yes Pain Present Now: No Electronic Signature(s) Signed: 12/23/2021 4:44:51 PM By: [name] Entered By: [name] -------------------------------------------------------------------------------- Encounter Discharge Information Details Patient Name: Date of Service: [name], [name]. 12/23/2021 10:45 A M Medical Record Number: [id number] Patient Account Number: [id number] Date of Birth/Sex: Treating [name]: [birth year] (82 y.o. [name], [name] Primary Care Litisha Guagliardo: [name] Other Clinician: Referring [name]: Treating [name]/Extender: [name] in Treatment: 9 Encounter Discharge Information  Items Post Procedure Vitals Discharge Condition: Stable Temperature (F): 98.7 Ambulatory Status: Ambulatory Pulse (bpm): 74 Discharge Destination: Home Respiratory Rate (breaths/min): 17 Transportation: Private Auto Blood Pressure (mmHg): 138/84 Accompanied By: husband Schedule Follow-up Appointment: Yes Clinical Summary of Care: Patient Declined Electronic Signature(s) Signed: 01/20/2022 5:35:40 PM By: [name] Entered By: [name] on 12/23/2021 11:10:42 [name] ([id number]) [id number] Page 2 of 5 -------------------------------------------------------------------------------- Lower Extremity Assessment Details Patient Name: Date of Service: Kim, [name] 12/23/2021 10:45 A M Medical Record Number: [id number] Patient Account Number: [id number] Date of Birth/Sex: Treating [name]: [name] (82 y.o. F) Primary Care [name]: [name] Other Clinician: Referring [name]: Treating [name]/Extender: [name] in Treatment: 9 Edema Assessment Assessed: [Left: No] [Right: No] Edema: [Left: Ye] [Right: s] Calf Left: Right: Point of Measurement: 37 cm From Medial Instep 35.5 cm Ankle Left: Right: Point of Measurement: 8 cm From Medial Instep 22.2 cm Electronic Signature(s) Signed: 12/23/2021 4:44:51 PM By: [name] Entered By: [name] on 12/23/2021 10:52:27 -------------------------------------------------------------------------------- Multi-Disciplinary Care Plan Details Patient Name: Date of Service: [name], [name]. 12/23/2021 10:45 A M Medical Record Number: [id number] Patient Account Number: [id number] Date of Birth/Sex: Treating [name]: [birth year] (82 y.o. [name], [name] Primary Care [name]: [name] Other Clinician: Referring [name]: Treating [name]/Extender: [name] in Treatment: 9 Active  Inactive Wound/Skin Impairment Nursing Diagnoses: Impaired tissue integrity Knowledge deficit related to smoking impact on wound healing Goals: Patient will have a decrease in wound volume by X% from date: (specify in notes) Date Initiated: 10/21/2021 Target Resolution Date: 01/16/2022 Goal Status: Active Patient/caregiver will verbalize understanding of skin care regimen Date Initiated: 10/21/2021 Date Inactivated: 11/11/2021 Target Resolution Date: 11/21/2021 Goal Status: Met Ulcer/skin breakdown will have a volume reduction of 30% by week 4 Date Initiated: 10/21/2021 Target Resolution Date: 01/16/2022 Goal Status: Active Interventions: Assess patient/caregiver ability to obtain necessary supplies Assess patient/caregiver ability  to perform ulcer/skin care regimen upon admission and as needed Assess ulceration(s) every visit [name], [name] ([id number]) [id number] Page 3 of 5 Notes: Electronic Signature(s) Signed: 01/20/2022 5:35:40 PM By: [name] Entered By: [name] on 12/23/2021 11:02:21 -------------------------------------------------------------------------------- Pain Assessment Details Patient Name: Date of Service: [name], [name]. 12/23/2021 10:45 A M Medical Record Number: [id number] Patient Account Number: [id number] Date of Birth/Sex: Treating [name]: [name] (82 y.o. F) Primary Care [name]: [name] Other Clinician: Referring [name]: Treating [name]/Extender: [name] in Treatment: 9 Active Problems Location of Pain Severity and Description of Pain Patient Has Paino No Site Locations Pain Management and Medication Current Pain Management: Electronic Signature(s) Signed: 12/23/2021 4:44:51 PM By: [name] Entered By: [name] on 12/23/2021 10:51:12 -------------------------------------------------------------------------------- Patient/Caregiver  Education Details Patient Name: Date of Service: [name] 12/6/2023andnbsp10:45 A M Medical Record Number: [id number] Patient Account Number: [id number] Date of Birth/Gender: Treating [name]: [name] (82 y.o. [name], [name] Primary Care [name]: [name] Other Clinician: Referring [name]: Treating [name]/Extender: [name] in Treatment: [address] [name], [name] ([id number]) [id number] Page 4 of 5 Education Provided To: Patient Education Topics Provided Wound/Skin Impairment: Methods: Explain/Verbal Responses: Reinforcements needed, State content correctly Electronic Signature(s) Signed: 01/20/2022 5:35:40 PM By: [name] Entered By: [name] on 12/23/2021 11:02:54 -------------------------------------------------------------------------------- Wound Assessment Details Patient Name: Date of Service: [name], [name]. 12/23/2021 10:45 A M Medical Record Number: [id number] Patient Account Number: [id number] Date of Birth/Sex: Treating [name]: [birth year] (82 y.o. F) Primary Care Trayce Caravello: [name] Other Clinician: Referring Reuven Braver: Treating Takelia Urieta/Extender: [name] in Treatment: 9 Wound Status Wound Number: 1 Primary Etiology: Venous Leg Ulcer Wound Location: Right, Medial Malleolus Wound Status: Open Wounding Event: Gradually Appeared Comorbid History: Peripheral Venous Disease Date Acquired: 09/30/2021 [name] Of Treatment: 9 Clustered Wound: No Photos Wound Measurements Length: (cm) Width: (cm) Depth: (cm) Area: (cm) Volume: (cm) 0.5 % Reduction in Area: 91.1% 0.4 % Reduction in Volume: 91.2% 0.2 Epithelialization: Medium (34-66%) 0.157 Tunneling: No 0.031 Undermining: No Wound Description Classification: Full Thickness With Exposed Suppo Wound Margin: Distinct, outline attached Exudate  Amount: Medium Exudate Type: Serosanguineous Exudate Color: red, brown rt Structures Foul Odor After Cleansing: No Slough/Fibrino Yes Wound Bed Granulation Amount: Small (1-33%) Exposed Structure Granulation Quality: Red, Pink Fascia Exposed: No Necrotic Amount: Large (67-100%) Fat Layer (Subcutaneous Tissue) Exposed: Yes Necrotic Quality: Adherent Slough Tendon Exposed: No Muscle Exposed: No Kibler, [name] ([id number]) [phone].pdf Page 5 of 5 Joint Exposed: No Bone Exposed: No Periwound Skin Texture Texture Color No Abnormalities Noted: No No Abnormalities Noted: No Callus: No Atrophie Blanche: No Crepitus: No Cyanosis: No Excoriation: No Ecchymosis: No Induration: No Erythema: No Rash: No Hemosiderin Staining: No Scarring: No Mottled: No Pallor: No Moisture Rubor: No No Abnormalities Noted: No Dry / Scaly: No Temperature / Pain Maceration: No Temperature: No Abnormality Tenderness on Palpation: Yes Electronic Signature(s) Signed: 12/23/2021 4:44:51 PM By: [name] Entered By: [name] on 12/23/2021 10:54:55 -------------------------------------------------------------------------------- Vitals Details Patient Name: Date of Service: [name], [name]. 12/23/2021 10:45 A M Medical Record Number: [id number] Patient Account Number: [id number] Date of Birth/Sex: Treating [name]: [name] (82 y.o. F) Primary Care Ramiro Pangilinan: [name] Other Clinician: Referring Malikhi Ogan: Treating Ottilie Wigglesworth/Extender: [name] in Treatment: 9 Vital Signs Time Taken: 10:45 Temperature (F): 97.9 Pulse (bpm): 83  Respiratory Rate (breaths/min): 18 Blood Pressure (mmHg): 159/84 Reference Range: 80 - 120 mg / dl Electronic Signature(s) Signed: 12/23/2021 4:44:51 PM By: [name] Entered By: [name] on 12/23/2021 10:46:03

## 2022-01-27 ENCOUNTER — Encounter (HOSPITAL_BASED_OUTPATIENT_CLINIC_OR_DEPARTMENT_OTHER): Payer: Medicare Other | Admitting: Physician Assistant

## 2022-01-27 DIAGNOSIS — L97312 Non-pressure chronic ulcer of right ankle with fat layer exposed: Secondary | ICD-10-CM | POA: Diagnosis not present

## 2022-01-27 DIAGNOSIS — I776 Arteritis, unspecified: Secondary | ICD-10-CM | POA: Diagnosis not present

## 2022-01-27 DIAGNOSIS — I872 Venous insufficiency (chronic) (peripheral): Secondary | ICD-10-CM | POA: Diagnosis not present

## 2022-01-27 DIAGNOSIS — I87331 Chronic venous hypertension (idiopathic) with ulcer and inflammation of right lower extremity: Secondary | ICD-10-CM | POA: Diagnosis not present

## 2022-01-27 NOTE — Progress Notes (Signed)
[name] ([id number]) [id number] Page 1 of 8 Visit Report for 01/27/2022 Chief Complaint Document Details Patient Name: Date of Service: [name], [name]. 01/27/2022 8:00 A M Medical Record Number: [id number] Patient Account Number: [id number] Date of Birth/Sex: Treating RN: 09-Sep-1940 (82 y.o. F) Primary Care Provider: [name] Other Clinician: Referring Provider: Treating Provider/Extender: [name] Weeks in Treatment: 14 Information Obtained from: Patient Chief Complaint Right ankle ulcer Electronic Signature(s) Signed: 01/27/2022 8:18:17 AM By: [name] PA-C Entered By: [name] on 01/27/2022 08:18:16 -------------------------------------------------------------------------------- Debridement Details Patient Name: Date of Service: [name], [name]. 01/27/2022 8:00 A M Medical Record Number: [id number] Patient Account Number: [id number] Date of Birth/Sex: Treating RN: Oct 20, 1940 (81 y.o. [name], [name] Primary Care Provider: [name] Other Clinician: Referring Provider: Treating Provider/Extender: [name] Weeks in Treatment: 14 Debridement Performed for Assessment: Wound #1 Right,Medial Malleolus Performed By: Physician [name], PA Debridement Type: Debridement Severity of Tissue Pre Debridement: Fat layer exposed Level of Consciousness (Pre-procedure): Awake and Alert Pre-procedure Verification/Time Out Yes - 08:30 Taken: Start Time: 08:31 Pain Control: Lidocaine 4% [name] Area Debrided (L x W): otal 1.7 (cm) x 1.1 (cm) = 1.87 (cm) Tissue and other material debrided: Viable, Non-Viable, Slough, Subcutaneous, Slough Level: Skin/Subcutaneous Tissue Debridement Description: Excisional Instrument: Curette Bleeding: Minimum Hemostasis Achieved: Pressure End Time: 08:36 Procedural Pain: 0 Post Procedural Pain: 0 Response  to Treatment: Procedure was tolerated well Level of Consciousness (Post- Awake and Alert procedure): Post Debridement Measurements of Total Wound Length: (cm) 1.7 Width: (cm) 1.1 Depth: (cm) 0.3 Volume: (cm) 0.441 Character of Wound/Ulcer Post Debridement: Requires Further Debridement Severity of Tissue Post Debridement: Fat layer exposed [name], [name] ([id number]) [id number] Page 2 of 8 Post Procedure Diagnosis Same as Pre-procedure Electronic Signature(s) Signed: 01/27/2022 4:42:12 PM By: [name] PA-C Signed: 01/27/2022 5:22:16 PM By: Deon Pilling RN, BSN Entered By: Deon Pilling on 01/27/2022 08:36:49 -------------------------------------------------------------------------------- HPI Details Patient Name: Date of Service: [name], [name]. 01/27/2022 8:00 A M Medical Record Number: [id number] Patient Account Number: [id number] Date of Birth/Sex: Treating RN: 03-27-1940 (82 y.o. F) Primary Care Provider: [name] Other Clinician: Referring Provider: Treating Provider/Extender: [name] Weeks in Treatment: 14 History of Present Illness HPI Description: 10-21-2021 upon evaluation today patient appears to be doing poorly in regard to the wound over the medial aspect of her right ankle. This is an area that previously she has had trouble with previously. I have actually seen her in [place] about a year ago but this was on the left ankle at that time. 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 This is also the typical spot for her as it is a very difficult area to compress. Nonetheless previously we have been able to get this under control using her compression socks as well as Xeroform gauze although I am not sure that is can be the best thing for her at this time we discussed that as we proceed. Patient does have chronic venous hypertension  and a history of vasculitis but otherwise no major medical problems. 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 Nonetheless I do think that we could potentially try some oral steroids, prednisone, to see if we can help calm this down. She is not opposed to this at all. In fact she would like to do anything she can to try to get better she tells me the Achilles area has been very painful at times for her. I really do not see any signs of infection right now but that symptomatic = as well. 10/18; patient with wounds looks a lot better today measuring smaller. She went to her grandsons wedding this weekend did not wear compression wraps. She completed her course of prednisone 50 mg we have been using TCA and Hydrofera Blue on the wound area. 11-11-2021 upon evaluation today patient appears to be doing well currently in regard to her wound in fact she is showing signs of good granulation and epithelization at this point and I am very pleased with where we stand currently. I do not see any signs of infection locally or systemically which is great news. No fevers, chills, nausea, vomiting, or diarrhea. I do believe that the anti-inflammatories did do well for her. 11-18-2021 upon evaluation today patient's wound is actually showing signs of excellent improvement and very pleased with where we were seeing this improvement thus far. I do not see any evidence of active infection locally or systemically which is great news and overall I do believe we are on the right track here. 11/8; patient comes in complaining of marked pain in this area. She takes Tylenol for the discomfort. Unfortunately we are not making much progress in the condition of this wound still slough covered. 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 With that being said there does not appear to be any signs of infection which is good news but the wound does not appear to be quite as healthy as what I saw the last time I saw her in 2 weeks and looking back at pictures I really feel like that there were very small compared to where we started there was a little bit of discoloration of the base of the wound that was not there last week. Some of this was probably just due to bleeding which is understandable from and following debridement but at the same time I do believe that nonetheless she still would benefit from the compression wrapping currently. She is definitely more swollen today than she was previous and she realizes this as well. 12-09-2021 upon evaluation today patient appears to be doing a little better in regard to her wound compared to last time although size wise is not much different it does seem to be improving with regard to the necrotic tissue that is turning more yellow and loosening up which is at least good news. With that being said I do believe that the patient would benefit from likely switching to Santyl this is good to me and we cannot wrap her and she is getting need to actually change this more frequently at home using her compression sock but I think that it may be a better way to go at the moment to see if we can get this cleaned up she is in agreement with that plan. If  we get to where we can use collagen then we will consider going back to the wrap. 12-23-2021 upon evaluation today patient's wound does have some necrotic tissue noted at this point and we are going to need and clean this away today. I discussed that with her. With  that being said fortunately there does not appear to be any signs of infection locally or systemically which is great news and overall I am extremely pleased in that regard. Her husband is changing this daily and that does seem to be benefiting her at this point. The wound is a bit smaller today compared to previous evaluation. 01-06-2022 upon evaluation today patient unfortunately appears to be doing worse in regard to her wound. Actually got a call from her yesterday in [place] because she told me that her foot and ankle area was doing much worse with increased pain. Fortunately I do not see any signs of infection systemically though locally I do's definitely see signs of cellulitis. I discussed that with her today. Nonetheless I do believe that she would benefit from continuation of the antibiotics which I placed her on yesterday. This includes the Bactrim DS although we may need to add something different depend on what the results of the culture come back showing. We are going to do a PCR culture today. Subsequently I am also going to go ahead and give the patient gentamicin cream which I am going to send into the pharmacy for her at this point. 01/13/2022: The culture that was taken last week returned positive for Pseudomonas, which certainly would not be covered by the Bactrim that was prescribed. She is having more pain in the wound and continues to have blue-green drainage on her dressing. There is substantial slough within the wound. AEISHA, MINARIK ([id number]) [id number] Page 3 of 8 01-20-2022 upon evaluation today patient appears to be doing well currently in regard to the her wound. This looks much better than last time I saw her. Fortunately she did get on the right antibiotic had to be switched to Levaquin actually came back positive that her culture showed Pseudomonas only. Subsequently since being on the Levaquin and the wound has gotten much better she  still has some burning but is not nearly as bad as what it was previous. 01-27-2022 upon evaluation today patient appears to be doing pretty well in regard to her wound compared to where things were previous. Fortunately I do not see any evidence of infection locally nor systemically which is great news and overall I do believe that we are headed in the right direction. Overall the patient's wound seems to be greatly improved. Electronic Signature(s) Signed: 01/27/2022 9:05:11 AM By: [name] PA-C Entered By: [name] on 01/27/2022 09:05:11 -------------------------------------------------------------------------------- Physical Exam Details Patient Name: Date of Service: [name], [name]. 01/27/2022 8:00 A M Medical Record Number: [id number] Patient Account Number: [id number] Date of Birth/Sex: Treating RN: 05/03/1940 (82 y.o. F) Primary Care Provider: [name] Other Clinician: Referring Provider: Treating Provider/Extender: [name] Weeks in Treatment: 43 Constitutional Well-nourished and well-hydrated in no acute distress. Respiratory normal breathing without difficulty. Psychiatric this patient is able to make decisions and demonstrates good insight into disease process. Alert and Oriented x 3. pleasant and cooperative. Notes Upon inspection patient's wound again did require minimal sharp debridement today to clearway some of the necrotic debris she tolerated that today without complication and postdebridement the wound bed appears to be doing much  better which is great news. Electronic Signature(s) Signed: 01/27/2022 9:05:26 AM By: [name] PA-C Entered By: [name] on 01/27/2022 09:05:26 -------------------------------------------------------------------------------- Physician Orders Details Patient Name: Date of Service: [name], [name]. 01/27/2022 8:00 A M Medical Record Number: [id number] Patient Account  Number: [id number] Date of Birth/Sex: Treating RN: 07/16/1940 (82 y.o. 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 Margarita Grizzle on Wednesday room 9 0930 02/03/2022 Return A ppointment in 2 weeks. Burman Blacksmith on Wednesday's room 9 0930 02/10/2022 Return A Anesthetic (In clinic) Topical Lidocaine 5% applied to wound bed Bathing/ Shower/ Hygiene May shower with protection but do not get wound dressing(s) wet. Protect dressing(s) with water repellant cover (for example, large plastic bag) or a cast cover and may then take shower. Edema Control - Lymphedema / SCD / Other Elevate legs to the level of the heart or above for 30 minutes daily and/or when sitting for 3-4 times a day throughout the day. Avoid standing for long periods of time. Exercise regularly Wound Treatment Wound #1 - Malleolus Wound Laterality: Right, Medial Cleanser: Normal Saline (Generic) 1 x Per Week/30 Days Discharge Instructions: Cleanse the wound with Normal Saline prior to applying a clean dressing using gauze sponges, not tissue or cotton balls. Cleanser: Soap and Water 1 x Per Week/30 Days Discharge Instructions: May shower and wash wound with dial antibacterial soap and water prior to dressing change. Peri-Wound Care: Sween Lotion (Moisturizing lotion) 1 x Per Week/30 Days Discharge Instructions: Apply moisturizing lotion as directed Prim Dressing: IODOFLEX 0.9% Cadexomer Iodine Pad 4x6  cm 1 x Per Week/30 Days ary Discharge Instructions: Apply to wound bed as instructed Secondary Dressing: ABD Pad, 8x10 1 x Per Week/30 Days Discharge Instructions: Apply over primary dressing as directed. Compression Wrap: ThreePress (3 layer compression wrap) 1 x Per Week/30 Days Discharge Instructions: Apply three layer compression as directed. Electronic Signature(s) Signed: 01/27/2022 4:42:12 PM By: [name] PA-C Signed: 01/27/2022 5:22:16 PM By: Deon Pilling RN, BSN Entered By: Deon Pilling on 01/27/2022 08:37:06 -------------------------------------------------------------------------------- Problem List Details Patient Name: Date of Service: [name], [name]. 01/27/2022 8:00 A M Medical Record Number: [id number] Patient Account Number: [id number] Date of Birth/Sex: Treating RN: Oct 23, 1940 (82 y.o. 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 01/27/2022 8:00 A M Medical Record Number: [id number] Patient Account Number: [id number] Date of Birth/Sex: Treating RN: May 31, 1940 (82 y.o. F) Primary Care Provider:  Elsie Stain  Brigitte Pulse Other Clinician: Referring Provider: Treating Provider/Extender: [name] Weeks in Treatment: 14 Subjective Chief Complaint Information obtained from Patient Right ankle ulcer History of Present Illness (HPI) 10-21-2021 upon evaluation today patient appears to be doing poorly in regard to the wound over the medial aspect of her right ankle. This is an area that previously she has had trouble with previously. I have actually seen her in [place] about a year ago but this was on the left ankle at that time. 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 This is also the typical spot for her as it is a very difficult area to compress. Nonetheless previously we have been able to get this under control using her compression socks as well as Xeroform gauze although I am not sure that is can be the best thing for her at this time we discussed that as we proceed. 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 Nonetheless I do think that we could potentially try some oral steroids, prednisone, to see if we can help calm this down. She is not opposed to this at all. In fact she would like to do anything she can to try to get better she tells me the Achilles area has been very painful at times for her. I really do not see any signs of infection right now but that symptomatic = as well. 10/18; patient with wounds looks a lot better today measuring smaller. She went to her grandsons wedding this weekend did not wear compression wraps. She completed her course of prednisone 50 mg we have been using TCA and  Hydrofera Blue on the wound area. 11-11-2021 upon evaluation today patient appears to be doing well currently in regard to her wound in fact she is showing signs of good granulation and epithelization at this point and I am very pleased with where we stand currently. I do not see any signs of infection locally or systemically which is great news. No fevers, chills, nausea, vomiting, or diarrhea. I do believe that the anti-inflammatories did do well for her. 11-18-2021 upon evaluation today patient's wound is actually showing signs of excellent improvement and very pleased with where we were seeing this improvement thus far. I do not see any evidence of active infection locally or systemically which is great news and overall I do believe we are on the right track here. 11/8; patient comes in complaining of marked pain in this area. She takes Tylenol for the discomfort. Unfortunately we are not making much progress in the condition of this wound still slough covered. 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 With that being said there does not appear to be any signs of infection which is good news but the wound does not appear to be quite as healthy as what I saw the last time I saw  her in 2 weeks and looking back at pictures I really feel like that there were very small compared to where we started there was a little bit of discoloration of the base of the wound that was not there last week. Some of this was probably just due to bleeding which is understandable from and following debridement but at the same time I do believe that nonetheless she still would benefit from the compression wrapping currently. She is  definitely more swollen today than she was previous and she realizes this as well. 12-09-2021 upon evaluation today patient appears to be doing a little better in regard to her wound compared to last time although size wise is not much different it does seem to be improving with regard to the necrotic tissue that is turning more yellow and loosening up which is at least good news. With that being said I do believe that the patient would benefit from likely switching to Santyl this is good to me and we cannot wrap her and she is getting need to actually change this more frequently at home using her compression sock but I think that it may be a better way to go at the moment to see if we can get this cleaned up she is in agreement with that plan. If we get to where we can use collagen then we will consider going back to the wrap. 12-23-2021 upon evaluation today patient's wound does have some necrotic tissue noted at this point and we are going to need and clean this away today. I discussed that with her. With that being said fortunately there does not appear to be any signs of infection locally or systemically which is great news and overall I am extremely pleased in that regard. Her husband is changing this daily and that does seem to be benefiting her at this point. The wound is a bit smaller today compared to previous evaluation. 01-06-2022 upon evaluation today patient unfortunately appears to be doing worse in regard to her wound. Actually got a call from her yesterday in [place] because she told me that her foot and ankle area was doing much worse with increased pain. Fortunately I do not see any signs of infection systemically though locally I do's definitely see signs of cellulitis. I discussed that with her today. Nonetheless I do believe that she would benefit from continuation of the antibiotics which I placed her on yesterday. This includes the Bactrim DS although we may need to add  something different depend on what the results of the [name], [name] ([id number]) [id number] Page 6 of 8 culture come back showing. We are going to do a PCR culture today. Subsequently I am also going to go ahead and give the patient gentamicin cream which I am going to send into the pharmacy for her at this point. 01/13/2022: The culture that was taken last week returned positive for Pseudomonas, which certainly would not be covered by the Bactrim that was prescribed. She is having more pain in the wound and continues to have blue-green drainage on her dressing. There is substantial slough within the wound. 01-20-2022 upon evaluation today patient appears to be doing well currently in regard to the her wound. This looks much better than last time I saw her. Fortunately she did get on the right antibiotic had to be switched to Levaquin actually came back positive that her culture showed Pseudomonas only. Subsequently since being on the Levaquin and the wound has gotten much better  she still has some burning but is not nearly as bad as what it was previous. 01-27-2022 upon evaluation today patient appears to be doing pretty well in regard to her wound compared to where things were previous. Fortunately I do not see any evidence of infection locally nor systemically which is great news and overall I do believe that we are headed in the right direction. Overall the patient's wound seems to be greatly improved. Objective Constitutional Well-nourished and well-hydrated in no acute distress. Vitals Time Taken: 8:05 AM, Temperature: 98.1 F, Pulse: 63 bpm, Respiratory Rate: 20 breaths/min, Blood Pressure: 147/79 mmHg. Respiratory normal breathing without difficulty. Psychiatric this patient is able to make decisions and demonstrates good insight into disease process. Alert and Oriented x 3. pleasant and cooperative. General Notes: Upon inspection patient's wound again did  require minimal sharp debridement today to clearway some of the necrotic debris she tolerated that today without complication and postdebridement the wound bed appears to be doing much better which is great news. Integumentary (Hair, Skin) Wound #1 status is Open. Original cause of wound was Gradually Appeared. The date acquired was: 09/30/2021. The wound has been in treatment 14 weeks. The wound is located on the Right,Medial Malleolus. The wound measures 1.7cm length x 1.1cm width x 0.3cm depth; 1.469cm^2 area and 0.441cm^3 volume. There is Fat Layer (Subcutaneous Tissue) exposed. There is no tunneling or undermining noted. There is a medium amount of purulent drainage noted. The wound margin is distinct with the outline attached to the wound base. There is no granulation within the wound bed. There is a large (67-100%) amount of necrotic tissue within the wound bed including Adherent Slough. The periwound skin appearance did not exhibit: Callus, Crepitus, Excoriation, Induration, Rash, Scarring, Dry/Scaly, Maceration, Atrophie Blanche, Cyanosis, Ecchymosis, Hemosiderin Staining, Mottled, Pallor, Rubor, Erythema. Periwound temperature was noted as No Abnormality. The periwound has tenderness on palpation. 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 There was a Excisional Skin/Subcutaneous Tissue Debridement with a total area of 1.87 sq cm performed by [name], PA. With the following instrument(s): Curette to remove Viable and Non-Viable tissue/material. Material removed includes Subcutaneous Tissue and Slough and after achieving pain control using  Lidocaine 4% [name] opical Solution. A time out was conducted at 08:30, prior to the start of the procedure. A Minimum amount of bleeding was controlled with Pressure. The procedure was tolerated well with a pain level of 0 throughout and a pain level of 0 following the procedure. Post Debridement Measurements: 1.7cm length x 1.1cm width x 0.3cm depth; 0.441cm^3 volume. Character of Wound/Ulcer Post Debridement requires further debridement. Severity of Tissue Post Debridement is: Fat layer exposed. Post procedure Diagnosis Wound #1: Same as Pre-Procedure Pre-procedure diagnosis of Wound #1 is a Venous Leg Ulcer located on the Right,Medial Malleolus . There was a Three Layer Compression Therapy Procedure by Deon Pilling, RN. Post procedure Diagnosis Wound #1: Same as Pre-Procedure [name], [name] ([id number]) [id number] Page 7 of 8 Plan Follow-up Appointments: Return Appointment in 1 week. Margarita Grizzle on Wednesday room 9 0930 02/03/2022 Return Appointment in 2 weeks. Burman Blacksmith on Wednesday's room 9 0930 02/10/2022 Anesthetic: (In clinic) Topical Lidocaine 5% applied to wound bed Bathing/ Shower/ Hygiene:  May shower with protection but do not get wound dressing(s) wet. Protect dressing(s) with water repellant cover (for example, large plastic bag) or a cast cover and may then take shower. Edema Control - Lymphedema / SCD / Other: Elevate legs to the level of the heart or above for 30 minutes daily and/or when sitting for 3-4 times a day throughout the day. Avoid standing for long periods of time. Exercise regularly WOUND #1: - Malleolus Wound Laterality: Right, Medial Cleanser: Normal Saline (Generic) 1 x Per Week/30 Days Discharge Instructions: Cleanse the wound with Normal Saline prior to applying a clean dressing using gauze sponges, not tissue or cotton balls. Cleanser: Soap and Water 1 x Per Week/30 Days Discharge Instructions: May shower and wash wound with dial  antibacterial soap and water prior to dressing change. Peri-Wound Care: Sween Lotion (Moisturizing lotion) 1 x Per Week/30 Days Discharge Instructions: Apply moisturizing lotion as directed Prim Dressing: IODOFLEX 0.9% Cadexomer Iodine Pad 4x6 cm 1 x Per Week/30 Days ary Discharge Instructions: Apply to wound bed as instructed Secondary Dressing: ABD Pad, 8x10 1 x Per Week/30 Days Discharge Instructions: Apply over primary dressing as directed. Com pression Wrap: ThreePress (3 layer compression wrap) 1 x Per Week/30 Days Discharge Instructions: Apply three layer compression as directed. 1. I am going to recommend that we actually switch to a Iodosorb/Iodoflex dressing which I think is going to be best for her currently. I do believe trying to clean this up while keeping it from becoming infected is going to be the key factor here. 2. I am also can recommend that we have the patient continue to monitor for any signs of infection or worsening. Obviously if she has increased pain or new issues she is to let me know she was warned about the Iodoflex possibly burning a little bit but other than that I really think she should do quite well. 3. We will continue with the 3 layer compression wrap which does seem to be doing very well for her. We will see patient back for reevaluation in 1 week here in the clinic. If anything worsens or changes patient will contact our office for additional recommendations. Electronic Signature(s) Signed: 01/27/2022 9:06:01 AM By: [name] PA-C Entered By: [name] on 01/27/2022 09:06:01 -------------------------------------------------------------------------------- SuperBill Details Patient Name: Date of Service: Thora Lance [name]. 01/27/2022 Medical Record Number: [id number] Patient Account Number: [id number] Date of Birth/Sex: Treating RN: 08/20/1940 (82 y.o. [name], Tammi Klippel Primary Care Provider: [name] Other Clinician: Referring  Provider: Treating Provider/Extender: [name] Weeks in Treatment: 14 Diagnosis Coding ICD-10 Codes Code Description [phone] 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 I87.331 Chronic venous hypertension (idiopathic) with ulcer and inflammation of right lower extremity Facility Procedures : CPT4 Code: [id number] Description: 62831 - DEB SUBQ TISSUE 20 SQ CM/< ICD-10 Diagnosis Description D17.616 Non-pressure chronic ulcer of right ankle with fat layer exposed Modifier: Quantity: 1 Physician Procedures CALEY, CIARAMITARO ([id number]): CPT4 Code Description [id number] 70350 - WC PHYS SUBQ TISS 20 SQ CM ICD-10 Diagnosis Description K93.818 Non-pressure chronic ulcer of right ankle with fat layer expos [id number] Page 8 of 8: Quantity Modifier 1 ed Electronic Signature(s) Signed: 01/27/2022 9:06:09 AM By: [name] PA-C Entered By: [name] on 01/27/2022 09:06:08

## 2022-01-28 NOTE — Progress Notes (Signed)
[name], [name] ([id number]) [id number] Page 1 of 6 Visit Report for 01/27/2022 Arrival Information Details Patient Name: Date of Service: [name], [name]. 01/27/2022 8:00 A M Medical Record Number: [id number] Patient Account Number: [id number] Date of Birth/Sex: Treating RN: [name] (82 y.o. [name], [name] Primary Care [name]: [name] Other Clinician: Referring Everlean Bucher: Treating [name]/Extender: [name] in Treatment: 14 Visit Information History Since Last Visit Added or deleted any medications: No Patient Arrived: Ambulatory Any new allergies or adverse reactions: No Arrival Time: 07:55 Had a fall or experienced change in No Accompanied By: husband activities of daily living that may affect Transfer Assistance: None risk of falls: Patient Identification Verified: Yes Signs or symptoms of abuse/neglect since last visito No Secondary Verification Process Completed: Yes Hospitalized since last visit: No Patient Requires Transmission-Based Precautions: No Implantable device outside of the clinic excluding No Patient Has Alerts: No cellular tissue based products placed in the center since last visit: Has Dressing in Place as Prescribed: Yes Has Compression in Place as Prescribed: Yes Pain Present Now: Yes Electronic Signature(s) Signed: 01/27/2022 5:22:16 PM By: Kim Pilling RN, BSN Entered By: [name] on 01/27/2022 08:05:11 -------------------------------------------------------------------------------- Compression Therapy Details Patient Name: Date of Service: [name], [name]. 01/27/2022 8:00 A M Medical Record Number: [id number] Patient Account Number: [id number] Date of Birth/Sex: Treating RN: [name] (82 y.o. [name], [name] Primary Care Joseeduardo Brix: [name] Other Clinician: Referring [name]: Treating Sherin Murdoch/Extender: [name]  in Treatment: 14 Compression Therapy Performed for Wound Assessment: Wound #1 Right,Medial Malleolus Performed By: Clinician Kim Pilling, RN Compression Type: Three Layer Post Procedure Diagnosis Same as Pre-procedure Electronic Signature(s) Signed: 01/27/2022 5:22:16 PM By: Kim Pilling RN, BSN Entered By: [name] on 01/27/2022 08:32:27 [name] ([id number]) [id number] Page 2 of 6 -------------------------------------------------------------------------------- Encounter Discharge Information Details Patient Name: Date of Service: [name], [name]. 01/27/2022 8:00 A M Medical Record Number: [id number] Patient Account Number: [id number] Date of Birth/Sex: Treating RN: [name] (82 y.o. [name], [name] Primary Care [name]: [name] Other Clinician: Referring Donnie Panik: Treating Elfie Costanza/Extender: [name] in Treatment: 14 Encounter Discharge Information Items Post Procedure Vitals Discharge Condition: Stable Temperature (F): 98.1 Ambulatory Status: Ambulatory Pulse (bpm): 63 Discharge Destination: Home Respiratory Rate (breaths/min): 20 Transportation: Private Auto Blood Pressure (mmHg): 147/79 Accompanied By: husband Schedule Follow-up Appointment: Yes Clinical Summary of Care: Electronic Signature(s) Signed: 01/27/2022 5:22:16 PM By: Kim Pilling RN, BSN Entered By: [name] on 01/27/2022 08:37:59 -------------------------------------------------------------------------------- Lower Extremity Assessment Details Patient Name: Date of Service: [name], [name]. 01/27/2022 8:00 A M Medical Record Number: [id number] Patient Account Number: [id number] Date of Birth/Sex: Treating RN: [birth year] (82 y.o. [name], [name] Primary Care Jayelyn Barno: [name] Other Clinician: Referring Marguerette Sheller: Treating [name] in Treatment:  14 Edema Assessment Assessed: [name]: No] [name]: Yes] Edema: [Left: N] [Right: o] Calf Left: Right: Point of Measurement: 37 cm From Medial Instep 39.5 cm Ankle Left: Right: Point of Measurement: 8 cm From Medial Instep 21 cm Vascular Assessment Pulses: Dorsalis Pedis Palpable: [Right:Yes] Electronic Signature(s) Signed: 01/27/2022 5:22:16 PM By: Kim Pilling RN, BSN Entered By: [name] on 01/27/2022 08:05:50 [name] Details -------------------------------------------------------------------------------- [name] ([id number]) [id number] Page 3 of 6 Patient Name: Date of Service: [name], [name]. 01/27/2022 8:00 A M Medical Record Number: [id number] Patient Account  Number: [id number] Date of Birth/Sex: Treating RN: [name] (82 y.o. [name], [name] Primary Care Kerryann Allaire: [name] Other Clinician: Referring [name]: Treating [name]/Extender: [name] in Treatment: 14 Active Inactive Wound/Skin Impairment Nursing Diagnoses: Impaired tissue integrity Knowledge deficit related to smoking impact on wound healing Goals: Patient will have a decrease in wound volume by X% from date: (specify in notes) Date Initiated: 10/21/2021 Target Resolution Date: 03/12/2022 Goal Status: Active Patient/caregiver will verbalize understanding of skin care regimen Date Initiated: 10/21/2021 Date Inactivated: 11/11/2021 Target Resolution Date: 11/21/2021 Goal Status: Met Ulcer/skin breakdown will have a volume reduction of 30% by week 4 Date Initiated: 10/21/2021 Date Inactivated: 01/13/2022 Target Resolution Date: 01/16/2022 Unmet Reason: see wound Goal Status: Unmet measurement. Interventions: Assess patient/caregiver ability to obtain necessary supplies Assess patient/caregiver ability to perform ulcer/skin care regimen upon admission and as needed Assess ulceration(s) every  visit Notes: Electronic Signature(s) Signed: 01/27/2022 5:22:16 PM By: Kim Pilling RN, BSN Entered By: [name] on 01/27/2022 08:10:09 -------------------------------------------------------------------------------- Pain Assessment Details Patient Name: Date of Service: [name], [name]. 01/27/2022 8:00 A M Medical Record Number: [id number] Patient Account Number: [id number] Date of Birth/Sex: Treating RN: [name] (82 y.o. [name], [name] Primary Care Kitiara Hintze: [name] Other Clinician: Referring Jaryd Drew: Treating Minta Fair/Extender: [name] in Treatment: 14 Active Problems Location of Pain Severity and Description of Pain Patient Has Paino Yes Site Locations Pain Location: [place], [place] [name] ([id number]) [id number] Page 4 of 6 Pain Location: Pain in Ulcers Rate the pain. Current Pain Level: 7 Pain Management and Medication Current Pain Management: Medication: No Cold Application: No Rest: No Massage: No Activity: No KeithE.N.S.: No Heat Application: No Leg drop or elevation: No Is the Current Pain Management Adequate: Adequate How does your wound impact your activities of daily livingo Sleep: No Bathing: No Appetite: No Relationship With Others: No Bladder Continence: No Emotions: No Bowel Continence: No Work: No Toileting: No Drive: No Dressing: No Hobbies: No Engineer, maintenance) Signed: 01/27/2022 5:22:16 PM By: Kim Pilling RN, BSN Entered By: [name] on 01/27/2022 08:05:37 -------------------------------------------------------------------------------- Patient/Caregiver Education Details Patient Name: Date of Service: [name] 1/10/2024andnbsp8:00 A M Medical Record Number: [id number] Patient Account Number: [id number] Date of Birth/Gender: Treating RN: [birth year] [name] (82 y.o. [name], [name] Primary Care Physician: [name] Other  Clinician: Referring Physician: Treating Physician/Extender: Karie Schwalbe in Treatment: 14 Education Assessment Education Provided To: Patient Education Topics Provided Wound/Skin Impairment: Handouts: Caring for Your Ulcer Methods: Explain/Verbal Responses: Reinforcements needed Electronic Signature(s) Signed: 01/27/2022 5:22:16 PM By: Kim Pilling RN, BSN Entered By: [name] on 01/27/2022 08:10:27 [name] ([id number]) [id number] Page 5 of 6 -------------------------------------------------------------------------------- Wound Assessment Details Patient Name: Date of Service: [name], [name]. 01/27/2022 8:00 A M Medical Record Number: [id number] Patient Account Number: [id number] Date of Birth/Sex: Treating RN: [name] (82 y.o. [name], [name] Primary Care Izabell Schalk: [name] Other Clinician: Referring Yecenia Dalgleish: Treating Keeya Dyckman/Extender: [name] in Treatment: 14 Wound Status Wound Number: 1 Primary Etiology: Venous Leg Ulcer Wound Location: Right, Medial Malleolus Wound Status: Open Wounding Event: Gradually Appeared Comorbid History: Peripheral Venous Disease Date Acquired: 09/30/2021 [name] Of Treatment: 14 Clustered Wound: No Photos Wound Measurements Length: (cm) 1.7 Width: (cm) 1.1 Depth: (cm) 0.3 Area: (cm) 1.469 Volume: (cm) 0.441 % Reduction in Area: 16.9% % Reduction in Volume: -24.9% Epithelialization: Small (1-33%) Tunneling: No  Undermining: No Wound Description Classification: Full Thickness With Exposed Suppor Wound Margin: Distinct, outline attached Exudate Amount: Medium Exudate Type: Purulent Exudate Color: yellow, brown, green [name] Structures Foul Odor After Cleansing: No Slough/Fibrino Yes Wound Bed Granulation Amount: None Present (0%) Exposed Structure Necrotic Amount: Large (67-100%) Fascia Exposed: No Necrotic  Quality: Adherent Slough Fat Layer (Subcutaneous Tissue) Exposed: Yes Tendon Exposed: No Muscle Exposed: No Joint Exposed: No Bone Exposed: No Periwound Skin Texture Texture Color No Abnormalities Noted: No No Abnormalities Noted: No Callus: No Atrophie Blanche: No Crepitus: No Cyanosis: No Excoriation: No Ecchymosis: No Induration: No Erythema: No Rash: No Hemosiderin Staining: No Scarring: No Mottled: No Pallor: No Moisture Rubor: No No Abnormalities Noted: No Dry / Scaly: No Temperature / Pain Maceration: No Temperature: No Abnormality [name], [name] ([id number]) [id number] Page 6 of 6 Tenderness on Palpation: Yes Treatment Notes Wound #1 (Malleolus) Wound Laterality: Right, Medial Cleanser Normal Saline Discharge Instruction: Cleanse the wound with Normal Saline prior to applying a clean dressing using gauze sponges, not tissue or cotton balls. Soap and Water Discharge Instruction: May shower and wash wound with dial antibacterial soap and water prior to dressing change. Peri-Wound Care Sween Lotion (Moisturizing lotion) Discharge Instruction: Apply moisturizing lotion as directed Topical Primary Dressing IODOFLEX 0.9% Cadexomer Iodine Pad 4x6 cm Discharge Instruction: Apply to wound bed as instructed Secondary Dressing ABD Pad, 8x10 Discharge Instruction: Apply over primary dressing as directed. Secured With Compression Wrap ThreePress (3 layer compression wrap) Discharge Instruction: Apply three layer compression as directed. Compression Stockings Add-Ons Electronic Signature(s) Signed: 01/27/2022 5:22:16 PM By: Kim Pilling RN, BSN Entered By: [name] on 01/27/2022 08:07:57 -------------------------------------------------------------------------------- Vitals Details Patient Name: Date of Service: [name], [name]. 01/27/2022 8:00 A M Medical Record Number: [id number] Patient Account Number: [id number] Date of  Birth/Sex: Treating RN: [birth year] (82 y.o. [name], [name] Primary Care Larue Drawdy: [name] Other Clinician: Referring Gayle Collard: Treating Delani Kohli/Extender: [name] in Treatment: 14 Vital Signs Time Taken: 08:05 Temperature (F): 98.1 Pulse (bpm): 63 Respiratory Rate (breaths/min): 20 Blood Pressure (mmHg): 147/79 Reference Range: 80 - 120 mg / dl Electronic Signature(s) Signed: 01/27/2022 5:22:16 PM By: Kim Pilling RN, BSN Entered By: [name] on 01/27/2022 21:97:58

## 2022-02-03 ENCOUNTER — Encounter (HOSPITAL_BASED_OUTPATIENT_CLINIC_OR_DEPARTMENT_OTHER): Payer: Medicare Other | Admitting: Physician Assistant

## 2022-02-03 DIAGNOSIS — I87331 Chronic venous hypertension (idiopathic) with ulcer and inflammation of right lower extremity: Secondary | ICD-10-CM | POA: Diagnosis not present

## 2022-02-03 DIAGNOSIS — I872 Venous insufficiency (chronic) (peripheral): Secondary | ICD-10-CM | POA: Diagnosis not present

## 2022-02-03 DIAGNOSIS — I776 Arteritis, unspecified: Secondary | ICD-10-CM | POA: Diagnosis not present

## 2022-02-03 DIAGNOSIS — L97312 Non-pressure chronic ulcer of right ankle with fat layer exposed: Secondary | ICD-10-CM | POA: Diagnosis not present

## 2022-02-03 NOTE — Progress Notes (Addendum)
LEE-ANN, CAVAZOS T ([id number]) [id number] Page 1 of 8 Visit Report for 02/03/2022 Chief Complaint Document Details Patient Name: Date of Service: [name], [name]. 02/03/2022 9:30 A M Medical Record Number: [id number] Patient Account Number: [id number] Date of Birth/Sex: Treating RN: 03/30/1940 (82 y.o. F) Primary Care Provider: [name] Other Clinician: Referring Provider: Treating Provider/Extender: [name] Weeks in Treatment: 15 Information Obtained from: Patient Chief Complaint Right ankle ulcer Electronic Signature(s) Signed: 02/03/2022 9:44:02 AM By: [name] PA-C Entered By: [name] on 02/03/2022 09:44:02 -------------------------------------------------------------------------------- Debridement Details Patient Name: Date of Service: [name], Lanelle T. 02/03/2022 9:30 A M Medical Record Number: [id number] Patient Account Number: [id number] Date of Birth/Sex: Treating RN: [birth year] (82 y.o. [name], [name] Primary Care Provider: [name] Other Clinician: Referring Provider: Treating Provider/Extender: [name] Weeks in Treatment: 15 Debridement Performed for Assessment: Wound #1 Right,Medial Malleolus Performed By: Physician [name], PA Debridement Type: Debridement Severity of Tissue Pre Debridement: Fat layer exposed Level of Consciousness (Pre-procedure): Awake and Alert Pre-procedure Verification/Time Out Yes - 10:25 Taken: Start Time: 10:25 Pain Control: Lidocaine T Area Debrided (L x W): otal 0.9 (cm) x 0.4 (cm) = 0.36 (cm) Tissue and other material debrided: Viable, Non-Viable, Slough, Subcutaneous, Slough Level: Skin/Subcutaneous Tissue Debridement Description: Excisional Instrument: Curette Bleeding: Minimum Hemostasis Achieved: Pressure End Time: 10:25 Procedural Pain: 0 Post Procedural Pain: 0 Response to Treatment:  Procedure was tolerated well Level of Consciousness (Post- Awake and Alert procedure): Post Debridement Measurements of Total Wound Length: (cm) 0.9 Width: (cm) 0.4 Depth: (cm) 0.3 Volume: (cm) 0.085 Character of Wound/Ulcer Post Debridement: Improved Severity of Tissue Post Debridement: Fat layer exposed Lawyer, Marcellene T ([id number] Page 2 of 8 Post Procedure Diagnosis Same as Pre-procedure Electronic Signature(s) Signed: 02/03/2022 3:53:33 PM By: [name] PA-C Signed: 03/08/2022 10:40:58 AM By: Rhae Hammock RN Entered By: Rhae Hammock on 02/03/2022 10:27:11 -------------------------------------------------------------------------------- HPI Details Patient Name: Date of Service: [name], Verner T. 02/03/2022 9:30 A M Medical Record Number: [id number] Patient Account Number: [id number] Date of Birth/Sex: Treating RN: 1940-05-03 (82 y.o. F) Primary Care Provider: [name] Other Clinician: Referring Provider: Treating Provider/Extender: [name] Weeks in Treatment: 15 History of Present Illness HPI Description: 10-21-2021 upon evaluation today patient appears to be doing poorly in regard to the wound over the medial aspect of her right ankle. This is an area that previously she has had trouble with previously. I have actually seen her in [place] about a year ago but this was on the left ankle at that time. 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 This is also the typical spot for her as it is a very difficult area to compress. Nonetheless previously we have been able to get this under control using her compression socks as well as Xeroform gauze although I am not sure that is can be the best thing for her at this time we discussed that as we proceed. 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 Nonetheless I  do think that we could potentially try some oral steroids, prednisone, to see if we can help calm this down. She is not opposed to this at all. In fact she would like to do anything she can to try to get better she tells me the Achilles area has been very painful at times for her. I really do not see any signs of infection right now but that symptomatic = as well. 10/18; patient with wounds looks a lot better today measuring smaller. She went to her grandsons wedding this weekend did not wear compression wraps. She completed her course of prednisone 50 mg we have been using TCA and Hydrofera Blue on the wound area. 11-11-2021 upon evaluation today patient appears to be doing well currently in regard to her wound in fact she is showing signs of good granulation and epithelization at this point and I am very pleased with where we stand currently. I do not see any signs of infection locally or systemically which is great news. No fevers, chills, nausea, vomiting, or diarrhea. I do believe that the anti-inflammatories did do well for her. 11-18-2021 upon evaluation today patient's wound is actually showing signs of excellent improvement and very pleased with where we were seeing this improvement thus far. I do not see any evidence of active infection locally or systemically which is great news and overall I do believe we are on the right track here. 11/8; patient comes in complaining of marked pain in this area. She takes Tylenol for the discomfort. Unfortunately we are not making much progress in the condition of this wound still slough covered. 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 With that being said there does not appear to be any signs of infection which is good news but the wound does not appear to be quite as healthy as what I saw the last time I saw her in 2 weeks and looking back at pictures I really feel like that there were very small compared to where we started there was a little bit of discoloration of the base of the wound that was not there last week. Some of this was probably just due to bleeding which is understandable from and following debridement but at the same time I do believe that nonetheless she still would benefit from the compression wrapping currently. She is definitely more swollen today than she was previous and she realizes this as well. 12-09-2021 upon evaluation today patient appears to be doing a little better in regard to her wound compared to last time although size wise is not much different it does seem to be improving with regard to the necrotic tissue that is turning more yellow and loosening up which is at least good news. With that being said I do believe that the patient would benefit from likely switching to Santyl this is good to me and we cannot wrap her and she is getting need to actually change this more frequently at home using her compression sock but I think that it may be a better way to go at the moment to see if we can get this cleaned up she is in agreement with that plan. If we get to where we can use  collagen then we will consider going back to the wrap. 12-23-2021 upon evaluation today patient's wound does have some necrotic tissue noted at this point and we are going to need and clean this away today. I discussed that with her. With that being said fortunately  there does not appear to be any signs of infection locally or systemically which is great news and overall I am extremely pleased in that regard. Her husband is changing this daily and that does seem to be benefiting her at this point. The wound is a bit smaller today compared to previous evaluation. 01-06-2022 upon evaluation today patient unfortunately appears to be doing worse in regard to her wound. Actually got a call from her yesterday in [place] because she told me that her foot and ankle area was doing much worse with increased pain. Fortunately I do not see any signs of infection systemically though locally I do's definitely see signs of cellulitis. I discussed that with her today. Nonetheless I do believe that she would benefit from continuation of the antibiotics which I placed her on yesterday. This includes the Bactrim DS although we may need to add something different depend on what the results of the culture come back showing. We are going to do a PCR culture today. Subsequently I am also going to go ahead and give the patient gentamicin cream which I am going to send into the pharmacy for her at this point. 01/13/2022: The culture that was taken last week returned positive for Pseudomonas, which certainly would not be covered by the Bactrim that was prescribed. She is having more pain in the wound and continues to have blue-green drainage on her dressing. There is substantial slough within the wound. ANUJIN, COSTON T ([id number]) [id number] Page 3 of 8 01-20-2022 upon evaluation today patient appears to be doing well currently in regard to the her wound. This looks much better than last time I saw her. Fortunately she did get on the right antibiotic had to be switched to Levaquin actually came back positive that her culture showed Pseudomonas only. Subsequently since being on the Levaquin and the wound has gotten much better she still has some burning but  is not nearly as bad as what it was previous. 01-27-2022 upon evaluation today patient appears to be doing pretty well in regard to her wound compared to where things were previous. Fortunately I do not see any evidence of infection locally nor systemically which is great news and overall I do believe that we are headed in the right direction. Overall the patient's wound seems to be greatly improved. 02-03-2022 upon evaluation today patient's wound is actually showing signs of improvement which is great news. Fortunately I do not see any evidence of worsening overall which is excellent and I do think she is headed in the appropriate direction. She is still having unfortunately discomfort but again I think this is definitely headed in the proper direction. Electronic Signature(s) Signed: 02/03/2022 2:07:44 PM By: [name] PA-C Entered By: [name] on 02/03/2022 14:07:44 -------------------------------------------------------------------------------- Physical Exam Details Patient Name: Date of Service: MEMORI, HIRAMOTO T. 02/03/2022 9:30 A M Medical Record Number: [id number] Patient Account Number: [id number] Date of Birth/Sex: Treating RN: 12/06/1940 (82 y.o. F) Primary Care Provider: [name] Other Clinician: Referring Provider: Treating Provider/Extender: [name] Weeks in Treatment: 40 Constitutional Well-nourished and well-hydrated in no acute distress. Respiratory normal breathing without difficulty. Psychiatric this patient is able to make  decisions and demonstrates good insight into disease process. Alert and Oriented x 3. pleasant and cooperative. Notes Patient's wound bed did require sharp debridement to clear away some of the necrotic debris she tolerated that today without complication and postdebridement the wound bed appears to be doing much better which is great news. No fevers, chills, nausea, vomiting, or  diarrhea. Electronic Signature(s) Signed: 02/03/2022 2:08:21 PM By: [name] PA-C Entered By: [name] on 02/03/2022 14:08:20 -------------------------------------------------------------------------------- Physician Orders Details Patient Name: Date of Service: [name], Sheika T. 02/03/2022 9:30 A M Medical Record Number: [id number] Patient Account Number: [id number] Date of Birth/Sex: Treating RN: 08/12/1940 (82 y.o. 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 Margarita Grizzle on Wednesday 01/24 @ 0930 Rm # 9 (already has appt.) Return A ppointment in 2 weeks. - w/ Vivianna Piccini ST on Wednesday (go ahead and make appt.) one Return A Anesthetic (In clinic) Topical Lidocaine 5% applied to wound bed Bathing/ Shower/ Hygiene May shower with protection but do not get wound dressing(s) wet. Protect dressing(s) with water repellant cover (for example, large plastic bag) or a cast cover and may then take shower. Edema Control - Lymphedema / SCD / Other Elevate legs to the level of the heart or above for 30 minutes daily and/or when sitting for 3-4 times a day throughout the day. Avoid standing for long periods of time. Exercise regularly Wound Treatment Wound #1 - Malleolus Wound Laterality: Right, Medial Cleanser: Normal Saline (Generic) 1 x Per Week/30 Days Discharge Instructions: Cleanse the wound with Normal Saline prior to applying a clean  dressing using gauze sponges, not tissue or cotton balls. Cleanser: Soap and Water 1 x Per Week/30 Days Discharge Instructions: May shower and wash wound with dial antibacterial soap and water prior to dressing change. Peri-Wound Care: Sween Lotion (Moisturizing lotion) 1 x Per Week/30 Days Discharge Instructions: Apply moisturizing lotion as directed Prim Dressing: IODOFLEX 0.9% Cadexomer Iodine Pad 4x6 cm 1 x Per Week/30 Days ary Discharge Instructions: Apply to wound bed as instructed Secondary Dressing: ABD Pad, 8x10 1 x Per Week/30 Days Discharge Instructions: Apply over primary dressing as directed. Compression Wrap: ThreePress (3 layer compression wrap) 1 x Per Week/30 Days Discharge Instructions: Apply three layer compression as directed. Electronic Signature(s) Signed: 02/03/2022 3:53:33 PM By: [name] PA-C Signed: 03/08/2022 10:40:58 AM By: Rhae Hammock RN Entered By: Rhae Hammock on 02/03/2022 10:22:10 -------------------------------------------------------------------------------- Problem List Details Patient Name: Date of Service: [name], Chaz T. 02/03/2022 9:30 A M Medical Record Number: [id number] Patient Account Number: [id number] Date of Birth/Sex: Treating RN: [birth year] (82 y.o. 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 02/03/2022 9:30 A M Medical Record Number: [id number] Patient Account Number: [id number] Date of Birth/Sex: Treating RN: 11-05-1940 (82 y.o. F) Primary Care Provider: [name] Other Clinician: Referring Provider: Treating Provider/Extender: [name] Weeks in Treatment: 15 Subjective Chief Complaint Information obtained from Patient Right ankle ulcer History of Present Illness (HPI) 10-21-2021 upon evaluation today patient appears to be doing poorly in regard to the wound over the medial aspect of her right ankle. This is an area that previously she has had trouble with previously. I have actually seen her in [place] about a year ago but this was on the left ankle at that time. 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 This is also the typical spot for her as it is a very difficult area to compress. Nonetheless previously we have been able to get this under control using her compression socks as well as Xeroform gauze although I am not sure that is can be the best thing for her at this time we discussed that as we proceed. 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 Nonetheless I do think that we could potentially try some oral steroids, prednisone, to see if we can help calm this down. She is not opposed to this at all. In fact she would like to do anything she can to  try to get better she tells me the Achilles area has been very painful at times for her. I really do not see any signs of infection right now but that symptomatic = as well. 10/18; patient with wounds looks a lot better today measuring smaller. She went to her grandsons wedding this weekend did not wear compression wraps. She completed her course of prednisone 50 mg we have been using TCA and Hydrofera Blue on the wound area. 11-11-2021 upon evaluation today patient appears to be doing well currently in regard to her wound in fact she is showing signs of good granulation and epithelization at this point and I am very pleased with where we stand currently. I do not see any signs of infection locally or systemically which is great news. No fevers, chills, nausea, vomiting, or diarrhea. I do believe that the anti-inflammatories did do well for her. 11-18-2021 upon evaluation today patient's wound is actually showing signs of excellent improvement and very pleased with where we were seeing this improvement thus far. I do not see any evidence of active infection locally or systemically which is great news and overall I do believe we are on the right track here. 11/8; patient comes in complaining of marked pain in this area. She takes Tylenol for the discomfort. Unfortunately we are not making much progress in the condition of this wound still slough covered. 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 With that being said there does not appear to be any signs of infection which is good news but the wound does not appear to be quite as healthy as what I saw the last time I saw her in 2 weeks and looking back at  pictures I really feel like that there were very small compared to where we started there was a little bit of discoloration of the base of the wound that was not there last week. Some of this was probably just due to bleeding which is understandable from and following debridement but at the same time I do believe that nonetheless she still would benefit from the compression wrapping currently. She is definitely more swollen today than she was previous and she realizes this as well. 12-09-2021 upon evaluation today patient appears to be doing a little better in regard to her wound compared to last time although size wise is not much different it does seem to be improving with regard to the necrotic tissue that is turning more yellow and loosening up which is at least good news. With that being said I do believe that the patient would benefit from likely switching to Santyl this is good to me and we cannot wrap her and she is getting need to actually change this more frequently at home using her compression sock but I think that it may be a better way to go at the moment to see if we can get this cleaned up she is in agreement with that plan. If we get to where we can use collagen then we will consider going back to the wrap. 12-23-2021 upon evaluation today patient's wound does have some necrotic tissue noted at this point and we are going to need and clean this away today. I discussed that with her. With that being said fortunately there does not appear to be any signs of infection locally or systemically which is great news and overall I am extremely pleased in that regard. Her husband is changing this daily and that does seem to be benefiting her at this point. The wound is a bit smaller today compared to previous evaluation. TYEASHA, KUENY T ([id number]) [id number] Page 6 of 8 01-06-2022 upon evaluation today patient unfortunately appears to be doing worse in regard to  her wound. Actually got a call from her yesterday in [place] because she told me that her foot and ankle area was doing much worse with increased pain. Fortunately I do not see any signs of infection systemically though locally I do's definitely see signs of cellulitis. I discussed that with her today. Nonetheless I do believe that she would benefit from continuation of the antibiotics which I placed her on yesterday. This includes the Bactrim DS although we may need to add something different depend on what the results of the culture come back showing. We are going to do a PCR culture today. Subsequently I am also going to go ahead and give the patient gentamicin cream which I am going to send into the pharmacy for her at this point. 01/13/2022: The culture that was taken last week returned positive for Pseudomonas, which certainly would not be covered by the Bactrim that was prescribed. She is having more pain in the wound and continues to have blue-green drainage on her dressing. There is substantial slough within the wound. 01-20-2022 upon evaluation today  patient appears to be doing well currently in regard to the her wound. This looks much better than last time I saw her. Fortunately she did get on the right antibiotic had to be switched to Levaquin actually came back positive that her culture showed Pseudomonas only. Subsequently since being on the Levaquin and the wound has gotten much better she still has some burning but is not nearly as bad as what it was previous. 01-27-2022 upon evaluation today patient appears to be doing pretty well in regard to her wound compared to where things were previous. Fortunately I do not see any evidence of infection locally nor systemically which is great news and overall I do believe that we are headed in the right direction. Overall the patient's wound seems to be greatly improved. 02-03-2022 upon evaluation today patient's wound is actually showing signs of  improvement which is great news. Fortunately I do not see any evidence of worsening overall which is excellent and I do think she is headed in the appropriate direction. She is still having unfortunately discomfort but again I think this is definitely headed in the proper direction. Objective Constitutional Well-nourished and well-hydrated in no acute distress. Vitals Time Taken: 9:46 AM, Temperature: 98 F, Pulse: 61 bpm, Respiratory Rate: 18 breaths/min, Blood Pressure: 140/64 mmHg. Respiratory normal breathing without difficulty. Psychiatric this patient is able to make decisions and demonstrates good insight into disease process. Alert and Oriented x 3. pleasant and cooperative. General Notes: Patient's wound bed did require sharp debridement to clear away some of the necrotic debris she tolerated that today without complication and postdebridement the wound bed appears to be doing much better which is great news. No fevers, chills, nausea, vomiting, or diarrhea. Integumentary (Hair, Skin) Wound #1 status is Open. Original cause of wound was Gradually Appeared. The date acquired was: 09/30/2021. The wound has been in treatment 15 weeks. The wound is located on the Right,Medial Malleolus. The wound measures 0.9cm length x 0.4cm width x 0.3cm depth; 0.283cm^2 area and 0.085cm^3 volume. There is Fat Layer (Subcutaneous Tissue) exposed. There is no tunneling or undermining noted. There is a medium amount of purulent drainage noted. The wound margin is distinct with the outline attached to the wound base. There is medium (34-66%) pink granulation within the wound bed. There is a medium (34- 66%) amount of necrotic tissue within the wound bed including Adherent Slough. The periwound skin appearance did not exhibit: Callus, Crepitus, Excoriation, Induration, Rash, Scarring, Dry/Scaly, Maceration, Atrophie Blanche, Cyanosis, Ecchymosis, Hemosiderin Staining, Mottled, Pallor, Rubor,  Erythema. Periwound temperature was noted as No Abnormality. The periwound has tenderness on palpation. 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 There was a Excisional Skin/Subcutaneous Tissue Debridement with a total area of 0.36 sq cm performed by [name], PA. With the following instrument(s): Curette to remove Viable and Non-Viable tissue/material. Material removed includes Subcutaneous Tissue and Slough and after achieving pain control using Lidocaine. No specimens were taken. A time out was conducted at 10:25, prior to the start of the procedure. A Minimum amount of bleeding was controlled with Pressure. The procedure was tolerated well with a pain level of 0 throughout and a pain level of 0 following the procedure. Post  Debridement Measurements: 0.9cm length x 0.4cm width x 0.3cm depth; 0.085cm^3 volume. Character of Wound/Ulcer Post Debridement is improved. Severity of Tissue Post Debridement is: Fat layer exposed. Post procedure Diagnosis Wound #1: Same as Pre-Procedure EMELI, STECKEL T ([id number]) [phone].pdf Page 7 of 8 Pre-procedure diagnosis of Wound #1 is a Venous Leg Ulcer located on the Right,Medial Malleolus . There was a Three Layer Compression Therapy Procedure by Rhae Hammock, RN. Post procedure Diagnosis Wound #1: Same as Pre-Procedure Plan Follow-up Appointments: Return Appointment in 1 week. Margarita Grizzle on Wednesday 01/24 @ 0930 Rm # 9 (already has appt.) Return Appointment in 2 weeks. - w/ Aseneth Hack ST on Wednesday (go ahead and make appt.) one Anesthetic: (In clinic) Topical Lidocaine 5%  applied to wound bed Bathing/ Shower/ Hygiene: May shower with protection but do not get wound dressing(s) wet. Protect dressing(s) with water repellant cover (for example, large plastic bag) or a cast cover and may then take shower. Edema Control - Lymphedema / SCD / Other: Elevate legs to the level of the heart or above for 30 minutes daily and/or when sitting for 3-4 times a day throughout the day. Avoid standing for long periods of time. Exercise regularly WOUND #1: - Malleolus Wound Laterality: Right, Medial Cleanser: Normal Saline (Generic) 1 x Per Week/30 Days Discharge Instructions: Cleanse the wound with Normal Saline prior to applying a clean dressing using gauze sponges, not tissue or cotton balls. Cleanser: Soap and Water 1 x Per Week/30 Days Discharge Instructions: May shower and wash wound with dial antibacterial soap and water prior to dressing change. Peri-Wound Care: Sween Lotion (Moisturizing lotion) 1 x Per Week/30 Days Discharge Instructions: Apply moisturizing lotion as directed Prim Dressing: IODOFLEX 0.9% Cadexomer Iodine Pad 4x6 cm 1 x Per Week/30 Days ary Discharge Instructions: Apply to wound bed as instructed Secondary Dressing: ABD Pad, 8x10 1 x Per Week/30 Days Discharge Instructions: Apply over primary dressing as directed. Com pression Wrap: ThreePress (3 layer compression wrap) 1 x Per Week/30 Days Discharge Instructions: Apply three layer compression as directed. 1. I am good recommend currently that we have the patient going continue to monitor for any signs of infection or worsening. Based on what I am seeing I do think that we are on the right track here. 2. I am also can recommend that the patient should continue to utilize the Iodoflex which I think is really doing a good job. 3. Will get a continue with ABD pad to cover followed by the 3 layer compression wrap. 4. I would also recommend she continue to elevate her leg is much as possible. We will see  patient back for reevaluation in 1 week here in the clinic. If anything worsens or changes patient will contact our office for additional recommendations. Electronic Signature(s) Signed: 02/03/2022 2:09:04 PM By: [name] PA-C Entered By: [name] on 02/03/2022 14:09:04 -------------------------------------------------------------------------------- SuperBill Details Patient Name: Date of Service: Thora Lance T. 02/03/2022 Medical Record Number: [id number] Patient Account Number: [id number] Date of Birth/Sex: Treating RN: Jun 20, 1940 (82 y.o. [name], [name] Primary Care Provider: [name] Other Clinician: Referring Provider: Treating Provider/Extender: [name] Weeks in Treatment: 15 Diagnosis Coding ICD-10 Codes Code Description [phone] 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 I87.331 Chronic venous hypertension (idiopathic) with ulcer and inflammation of right lower extremity Facility Procedures : IVYROSE, GIERKE Code: Krisinda T ([id number]) [id number] I Description: [id number] - DEB SUBQ TISSUE  20 SQ CM/< CD-10 Diagnosis Description L97.312 Non-pressure chronic ulcer of right ankle with fat layer exposed Modifier: [email or web address] Pag 1 Quantity: e 8 of 8 Physician Procedures : CPT4 Code Description Modifier [id number] - WC PHYS SUBQ TISS 20 SQ CM ICD-10 Diagnosis Description [id number] Non-pressure chronic ulcer of right ankle with fat layer exposed Quantity: 1 Electronic Signature(s) Signed: 02/03/2022 2:09:49 PM By: [name] PA-C Entered By: [name] on 02/03/2022 14:09:49

## 2022-02-09 ENCOUNTER — Encounter: Payer: Self-pay | Admitting: Family Medicine

## 2022-02-09 ENCOUNTER — Telehealth: Payer: Self-pay | Admitting: Family Medicine

## 2022-02-09 ENCOUNTER — Ambulatory Visit (INDEPENDENT_AMBULATORY_CARE_PROVIDER_SITE_OTHER): Payer: Medicare Other | Admitting: Family Medicine

## 2022-02-09 VITALS — BP 130/64 | HR 70 | Temp 98.2°F | Resp 16 | Ht 68.0 in | Wt 202.5 lb

## 2022-02-09 DIAGNOSIS — R051 Acute cough: Secondary | ICD-10-CM | POA: Diagnosis not present

## 2022-02-09 DIAGNOSIS — U071 COVID-19: Secondary | ICD-10-CM | POA: Diagnosis not present

## 2022-02-09 LAB — POC COVID19 BINAXNOW: SARS Coronavirus 2 Ag: POSITIVE — AB

## 2022-02-09 MED ORDER — NIRMATRELVIR/RITONAVIR (PAXLOVID)TABLET: 3.0000 | ORAL_TABLET | Freq: Two times a day (BID) | ORAL | 0 refills | Status: AC

## 2022-02-09 NOTE — Telephone Encounter (Signed)
Monica from [facility] called in stating that for the past week [name] has been congestion. She stated that she has been coughing and constantly blowing her nose. She stated she recently had a death in her family and was outside in the cold and wind for the service and believes this may have triggered it. She was wanting to know if Dr. Damita Dunnings could send in an antibiotic (amoxicillin) for her. She uses the [facility] [id number] - [address], [place]. She can be reached at [phone]. Thank you!

## 2022-02-09 NOTE — Progress Notes (Signed)
Patient ID: [name], female    DOB: 16-Jun-1940, 82 y.o.   MRN: [id number]  This visit was conducted in person.  BP 130/64   Pulse 70   Temp 98.2 F (36.8 C)   Resp 16   Ht '5\' 8"'$  (1.727 m)   Wt 202 lb 8 oz (91.9 kg)   SpO2 97%   BMI 30.79 kg/m    CC:  Chief Complaint  Patient presents with   Cough    X 1 weeks   Wheezing    Subjective:   HPI: [name] is a 82 y.o. female  patient of Dr. Josefine [name] presenting on 02/09/2022 for Cough (X 1 weeks) and Wheezing  Date of onset:  1 week, significantly worse 3 days ago. Initial symptoms included headache, congestion. Symptoms progressed to dry cough. Occ wheeze.   No body ache.  No fever. No SOB. No sinus pain, no ear pain.  No fatigue.   Sick contacts: pastor sick COVID testing:   positive in office today    She has tried to treat with  tylenol, benzonatate three times daily.    No history of chronic lung disease such as asthma or COPD. Non-smoker.      Relevant past medical, surgical, family and social history reviewed and updated as indicated. Interim medical history since our last visit reviewed. Allergies and medications reviewed and updated. Outpatient Medications Prior to Visit  Medication Sig Dispense Refill   albuterol (VENTOLIN HFA) 108 (90 Base) MCG/ACT inhaler Inhale 1-2 puffs into the lungs every 8 (eight) hours as needed for wheezing (or for cough). Okay to fill with proair/ventolin/albuterol 8 g 1   atenolol (TENORMIN) 50 MG tablet Take 1 tablet (50 mg total) by mouth daily. 90 tablet 3   butalbital-acetaminophen-caffeine (FIORICET) 50-325-40 MG tablet TAKE ONE TABLET BY MOUTH TWICE A DAY AS NEEDED FOR FOR HEADACHE 60 tablet 0   Cholecalciferol (VITAMIN D) 50 MCG (2000 UT) CAPS Take 1 capsule (2,000 Units total) by mouth daily.     diazepam (VALIUM) 5 MG tablet TAKE 1/2 TO 1 TABLET BY MOUTH EVERY 12 HOURS AS NEEDED FOR ANXIETY 30 tablet 1   escitalopram (LEXAPRO) 5 MG tablet Take 1 tablet  (5 mg total) by mouth daily. 90 tablet 3   FEROSUL 325 (65 Fe) MG tablet TAKE ONE TABLET BY MOUTH EVERY OTHER DAY 45 tablet 3   fluocinonide-emollient (LIDEX-E) 0.05 % cream APPLY TOPICALLY TO AFFECTED AREA(S) TWICE DAILY 60 g 2   gabapentin (NEURONTIN) 300 MG capsule Take up to 5 tabs a day for RLS (1 in AM, 1 midday, 3 in PM), 150 capsule 5   ibuprofen (ADVIL) 800 MG tablet TAKE ONE TABLET BY MOUTH EVERY 8 HOURS AS NEEDED FOR MODERATE PAIN.  TAKE WITH FOOD 180 tablet 1   levothyroxine (SYNTHROID) 100 MCG tablet TAKE ONE TABLET BY MOUTH DAILY EXCEPT 1/2 TABLET ON SUNDAYS 90 tablet 3   Multiple Vitamin (MULTIVITAMIN) tablet Take 1 tablet by mouth daily.     omeprazole (PRILOSEC) 20 MG capsule Take 1 capsule (20 mg total) by mouth daily. 90 capsule 3   rOPINIRole (REQUIP) 2 MG tablet Take 1 tablet (2 mg total) by mouth in the morning and at bedtime. 180 tablet 3   simvastatin (ZOCOR) 40 MG tablet TAKE ONE TABLET BY MOUTH EVERY NIGHT AT BEDTIME 90 tablet 2   triamcinolone (KENALOG) 0.1 % paste APPLY A SMALL AMOUNT TO THE TONGUE LESION TWO TIMES A DAY FOR  10 DAYS 5 g 0   No facility-administered medications prior to visit.     Per HPI unless specifically indicated in ROS section below Review of Systems  Constitutional:  Negative for fatigue and fever.  HENT:  Positive for congestion.   Eyes:  Negative for pain.  Respiratory:  Positive for cough. Negative for shortness of breath.   Cardiovascular:  Negative for chest pain, palpitations and leg swelling.  Gastrointestinal:  Negative for abdominal pain.  Genitourinary:  Negative for dysuria and vaginal bleeding.  Musculoskeletal:  Negative for back pain.  Neurological:  Positive for headaches. Negative for syncope and light-headedness.  Psychiatric/Behavioral:  Negative for dysphoric mood.    Objective:  BP 130/64   Pulse 70   Temp 98.2 F (36.8 C)   Resp 16   Ht '5\' 8"'$  (1.727 m)   Wt 202 lb 8 oz (91.9 kg)   SpO2 97%   BMI 30.79 kg/m    Wt Readings from Last 3 Encounters:  02/09/22 202 lb 8 oz (91.9 kg)  10/20/21 209 lb (94.8 kg)  10/12/21 207 lb 8 oz (94.1 kg)      Physical Exam Constitutional:      General: She is not in acute distress.    Appearance: Normal appearance. She is well-developed. She is not ill-appearing or toxic-appearing.  HENT:     Head: Normocephalic.     Right Ear: Hearing, tympanic membrane, ear canal and external ear normal. Tympanic membrane is not erythematous, retracted or bulging.     Left Ear: Hearing, tympanic membrane, ear canal and external ear normal. Tympanic membrane is not erythematous, retracted or bulging.     Nose: Congestion present. No mucosal edema or rhinorrhea.     Right Sinus: No maxillary sinus tenderness or frontal sinus tenderness.     Left Sinus: No maxillary sinus tenderness or frontal sinus tenderness.     Mouth/Throat:     Pharynx: Uvula midline. Posterior oropharyngeal erythema present.  Eyes:     General: Lids are normal. Lids are everted, no foreign bodies appreciated.     Conjunctiva/sclera: Conjunctivae normal.     Pupils: Pupils are equal, round, and reactive to light.  Neck:     Thyroid: No thyroid mass or thyromegaly.     Vascular: No carotid bruit.     Trachea: Trachea normal.  Cardiovascular:     Rate and Rhythm: Normal rate and regular rhythm.     Pulses: Normal pulses.     Heart sounds: Normal heart sounds, S1 normal and S2 normal. No murmur heard.    No friction rub. No gallop.  Pulmonary:     Effort: Pulmonary effort is normal. No tachypnea or respiratory distress.     Breath sounds: Normal breath sounds. No decreased breath sounds, wheezing, rhonchi or rales.  Abdominal:     General: Bowel sounds are normal.     Palpations: Abdomen is soft.     Tenderness: There is no abdominal tenderness.  Musculoskeletal:     Cervical back: Normal range of motion and neck supple.  Skin:    General: Skin is warm and dry.     Findings: No rash.   Neurological:     Mental Status: She is alert.  Psychiatric:        Mood and Affect: Mood is not anxious or depressed.        Speech: Speech normal.        Behavior: Behavior normal. Behavior is cooperative.  Thought Content: Thought content normal.        Judgment: Judgment normal.       Results for orders placed or performed in visit on 02/09/22  POC COVID-19  Result Value Ref Range   SARS Coronavirus 2 Ag Positive (A) Negative    Assessment and Plan  COVID Assessment & Plan: COVID19  Infection < 5 days from onset of symptoms in  vaccinated overweight individual with history of HTN  No clear sign of bacterial infection at this time.   No SOB.  No red flags/need for ER visit or in-person exam at respiratory clinic at this time..    Pt higher risk for COVID complications given  age, htn. GFR  67 and no medication contraindications.  Start paxlovid 5 day course. Reviewed course of medication and side effect profile with patient in detail.   Symptomatic care with mucinex and cough suppressant at night. If SOB begins symptoms worsening.. have low threshold for in-person exam, if severe shortness of breath ER visit recommended.  Can monitor Oxygen saturation at home with home monitor if able to obtain.  Go to ER if O2 sat < 90% on room air.   Reviewed home care and provided information through [facility].  Recommended quarantine 5 days isolation recommended. Return to work day 6 and wear mask for 4 more days to complete 10 days. Provided info about prevention of spread of COVID 19.    Acute cough -     POC COVID-19 BinaxNow  Other orders -     nirmatrelvir/ritonavir; Take 3 tablets by mouth 2 (two) times daily for 5 days. (Take nirmatrelvir 150 mg two tablets twice daily for 5 days and ritonavir 100 mg one tablet twice daily for 5 days) Patient GFR is 76  Dispense: 30 tablet; Refill: 0    No follow-ups on file.   Eliezer Lofts, MD

## 2022-02-09 NOTE — Telephone Encounter (Signed)
Patient has been scheduled

## 2022-02-09 NOTE — Assessment & Plan Note (Addendum)
COVID19  Infection < 5 days from onset of symptoms in  vaccinated overweight individual with history of HTN  No clear sign of bacterial infection at this time.   No SOB.  No red flags/need for ER visit or in-person exam at respiratory clinic at this time..    Pt higher risk for COVID complications given  age, htn. GFR  67 and no medication contraindications.  Start paxlovid 5 day course. Reviewed course of medication and side effect profile with patient in detail.   Symptomatic care with mucinex and cough suppressant at night. If SOB begins symptoms worsening.. have low threshold for in-person exam, if severe shortness of breath ER visit recommended.  Can monitor Oxygen saturation at home with home monitor if able to obtain.  Go to ER if O2 sat < 90% on room air.   Reviewed home care and provided information through [facility].  Recommended quarantine 5 days isolation recommended. Return to work day 6 and wear mask for 4 more days to complete 10 days. Provided info about prevention of spread of COVID 19.

## 2022-02-09 NOTE — Patient Instructions (Addendum)
Stop simvastatin while on Paxlovid.[name] then restart when done. Rest, fluids, use tyleneol as needed for headache.  Can use benzonatate for cough three times daily as needed. Call if not improving as expected, go to ER if severe shortness of breath.

## 2022-02-10 ENCOUNTER — Encounter (HOSPITAL_BASED_OUTPATIENT_CLINIC_OR_DEPARTMENT_OTHER): Payer: Medicare Other | Admitting: Physician Assistant

## 2022-02-10 DIAGNOSIS — I87331 Chronic venous hypertension (idiopathic) with ulcer and inflammation of right lower extremity: Secondary | ICD-10-CM | POA: Diagnosis not present

## 2022-02-10 DIAGNOSIS — I776 Arteritis, unspecified: Secondary | ICD-10-CM | POA: Diagnosis not present

## 2022-02-10 DIAGNOSIS — L97312 Non-pressure chronic ulcer of right ankle with fat layer exposed: Secondary | ICD-10-CM | POA: Diagnosis not present

## 2022-02-10 NOTE — Progress Notes (Signed)
Kim, [name] ([id number]) [id number] Page 1 of 7 Visit Report for 02/10/2022 Chief Complaint Document Details Patient Name: Date of Service: [name], CELLI 02/10/2022 9:30 A M Medical Record Number: [id number] Patient Account Number: [id number] Date of Birth/Sex: Treating RN: [birth year] (82 y.o. F) Primary Care Provider: [name] Other Clinician: Referring Provider: Treating Provider/Extender: [name] Weeks in Treatment: 16 Information Obtained from: Patient Chief Complaint Right ankle ulcer Electronic Signature(s) Signed: 02/10/2022 9:13:54 AM By: [name] PA-C Entered By: [name] on 02/10/2022 09:13:53 -------------------------------------------------------------------------------- HPI Details Patient Name: Date of Service: [name], [name] 02/10/2022 9:30 A M Medical Record Number: [id number] Patient Account Number: [id number] Date of Birth/Sex: Treating RN: [birth year] (82 y.o. F) Primary Care Provider: [name] Other Clinician: Referring Provider: Treating Provider/Extender: [name] Weeks in Treatment: 16 History of Present Illness HPI Description: 10-21-2021 upon evaluation today patient appears to be doing poorly in regard to the wound over the medial aspect of her right ankle. This is an area that previously she has had trouble with previously. I have actually seen her in [place] about a year ago but this was on the left ankle at that time. 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 This is also the typical spot for her as it is a very difficult area to compress. Nonetheless previously we have been able to get this under control using her compression socks as well as Xeroform gauze although I am not sure that is can be the best thing for her at this time we discussed that as we  proceed. 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 Nonetheless I do think that we could potentially try some oral steroids, prednisone, to see if we can help calm this down. She is not opposed to this at all. In fact she would like to do anything she can to try to get better she tells me the Achilles area has been very painful at times for her. I really do not see any signs of infection right now but that symptomatic = as well. 10/18; patient with wounds looks a lot better today measuring smaller. She went to her grandsons wedding this weekend did not wear compression wraps. She completed her course of prednisone 50 mg we have been using TCA and Hydrofera Blue on the wound area. 11-11-2021 upon evaluation today patient appears to be doing well currently in regard to her wound in fact she is showing signs of good granulation and epithelization at this point and I am very pleased with where we stand currently. I do not see any signs of infection locally or systemically which is great news. No fevers, chills, nausea, vomiting, or diarrhea. I do believe that the anti-inflammatories did do well for her. 11-18-2021 upon evaluation today patient's wound is actually showing signs of excellent improvement and very pleased with where we were seeing this improvement thus far. I do not see any evidence of active infection locally or systemically which is great news and overall I do believe we are on the  right track here. 11/8; patient comes in complaining of marked pain in this area. She takes Tylenol for the discomfort. Unfortunately we are not making much progress in the condition of this wound still slough  covered. 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 With that being said there does not appear to be any signs of infection which is good news but the wound does not appear to be quite as healthy as what I saw the last time I saw her in 2 weeks and looking back at pictures I really feel like that there were very small compared to where we started there was a little bit of discoloration of the base of the wound that was not there last TERREN, HABERLE T ([id number]) [id number] Page 2 of 7 week. Some of this was probably just due to bleeding which is understandable from and following debridement but at the same time I do believe that nonetheless she still would benefit from the compression wrapping currently. She is definitely more swollen today than she was previous and she realizes this as well. 12-09-2021 upon evaluation today patient appears to be doing a little better in regard to her wound compared to last time although size wise is not much different it does seem to be improving with regard to the necrotic tissue that is turning more yellow and loosening up which is at least good news. With that being said I do believe that the patient would benefit from likely switching to Santyl this is good to me and we cannot wrap her and she is getting need to actually change this more frequently at home using her compression sock but I think that it may be a better way to go at the moment to see if we can get this cleaned up she is in agreement with that plan. If we get to where we can use collagen then we will consider going back to the wrap. 12-23-2021 upon evaluation today patient's wound  does have some necrotic tissue noted at this point and we are going to need and clean this away today. I discussed that with her. With that being said fortunately there does not appear to be any signs of infection locally or systemically which is great news and overall I am extremely pleased in that regard. Her husband is changing this daily and that does seem to be benefiting her at this point. The wound is a bit smaller today compared to previous evaluation. 01-06-2022 upon evaluation today patient unfortunately appears to be doing worse in regard to her wound. Actually got a call from her yesterday in [place] because she told me that her foot and ankle area was doing much worse with increased pain. Fortunately I do not see any signs of infection systemically though locally I do's definitely see signs of cellulitis. I discussed that with her today. Nonetheless I do believe that she would benefit from continuation of the antibiotics which I placed her on yesterday. This includes the Bactrim DS although we may need to add something different depend on what the results of the culture come back showing. We are going to do a PCR culture today. Subsequently I am also going to go ahead and give the patient  gentamicin cream which I am going to send into the pharmacy for her at this point. 01/13/2022: The culture that was taken last week returned positive for Pseudomonas, which certainly would not be covered by the Bactrim that was prescribed. She is having more pain in the wound and continues to have blue-green drainage on her dressing. There is substantial slough within the wound. 01-20-2022 upon evaluation today patient appears to be doing well currently in regard to the her wound. This looks much better than last time I saw her. Fortunately she did get on the right antibiotic had to be switched to Levaquin actually came back positive that her culture showed Pseudomonas only. Subsequently since being on  the Levaquin and the wound has gotten much better she still has some burning but is not nearly as bad as what it was previous. 01-27-2022 upon evaluation today patient appears to be doing pretty well in regard to her wound compared to where things were previous. Fortunately I do not see any evidence of infection locally nor systemically which is great news and overall I do believe that we are headed in the right direction. Overall the patient's wound seems to be greatly improved. 02-03-2022 upon evaluation today patient's wound is actually showing signs of improvement which is great news. Fortunately I do not see any evidence of worsening overall which is excellent and I do think she is headed in the appropriate direction. She is still having unfortunately discomfort but again I think this is definitely headed in the proper direction. 02-10-2022 upon evaluation today patient's wound is actually showing signs of being smaller and measuring better. Fortunately I do not see any evidence of infection and I was able to get the necrotic surface of the wound without any additional sharp debridement today. She actually tolerated this with minimal discomfort compared to what she was previously noted. Overall I am actually very pleased with where we stand currently. Electronic Signature(s) Signed: 02/10/2022 5:35:46 PM By: [name] PA-C Entered By: [name] on 02/10/2022 17:35:46 -------------------------------------------------------------------------------- Physical Exam Details Patient Name: Date of Service: [name], [name] 02/10/2022 9:30 A M Medical Record Number: [id number] Patient Account Number: [id number] Date of Birth/Sex: Treating RN: 03-Aug-1940 (82 y.o. F) Primary Care Provider: [name] Other Clinician: Referring Provider: Treating Provider/Extender: [name] Weeks in Treatment: 84 Constitutional Well-nourished and well-hydrated in no  acute distress. Respiratory normal breathing without difficulty. Psychiatric this patient is able to make decisions and demonstrates good insight into disease process. Alert and Oriented x 3. pleasant and cooperative. Notes Upon inspection patient's wound bed actually showed signs of good granulation epithelization at this point. Fortunately I do not see any evidence of infection locally nor systemically which is great news and overall I am extremely pleased with where we stand. Electronic Signature(s) Signed: 02/10/2022 5:36:03 PM By: [name] PA-C Entered By: [name] on 02/10/2022 17:36:03 Fedele, Virdia T ([id number]) [id number] Page 3 of 7 -------------------------------------------------------------------------------- Physician Orders Details Patient Name: Date of Service: AIMIE, WAGMAN 02/10/2022 9:30 A M Medical Record Number: [id number] Patient Account Number: [id number] Date of Birth/Sex: Treating RN: October 27, 1940 (82 y.o. 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 Margarita Grizzle on Wednesday 02/17/22 @ 0930 Rm # 9 (already has appt.) Return A ppointment in 2 weeks. - w/ Anthony Tamburo ST and [facility] Rm # 9 on Wednesday (go ahead and make appt.) one Return A Anesthetic (In clinic) Topical Lidocaine 5% applied to wound bed Cellular or Tissue Based Products Cellular or Tissue Based Product Type: - Apligraf- RUN IVR 02/10/22 Bathing/ Shower/ Hygiene May shower with protection but do not  get wound dressing(s) wet. Protect dressing(s) with water repellant cover (for example, large plastic bag) or a cast cover and may then take shower. Edema Control - Lymphedema / SCD / Other Elevate legs to the level of the heart or above for 30 minutes daily and/or when sitting for 3-4 times a day throughout the day. Avoid standing for long periods of time. Exercise regularly Wound Treatment Wound #1 - Malleolus Wound Laterality: Right, Medial Cleanser: Normal Saline (Generic) 1 x Per Week/30 Days Discharge Instructions: Cleanse the wound with Normal Saline prior to applying a clean dressing using gauze sponges, not tissue or cotton balls. Cleanser: Soap and Water 1 x Per Week/30 Days Discharge Instructions: May shower and wash wound with dial antibacterial soap and water prior to dressing change. Peri-Wound Care: Sween Lotion (Moisturizing lotion) 1 x Per Week/30 Days Discharge Instructions: Apply moisturizing lotion as directed Prim Dressing: IODOFLEX 0.9% Cadexomer Iodine Pad 4x6 cm 1 x Per Week/30 Days ary Discharge Instructions: Apply to wound bed as instructed Secondary Dressing: ABD Pad, 8x10 1 x Per Week/30 Days Discharge Instructions: Apply over primary dressing as directed. Compression Wrap: ThreePress (3 layer compression wrap) 1 x Per Week/30 Days Discharge Instructions: Apply three layer compression as directed. Electronic Signature(s) Signed: 02/10/2022 4:09:44 PM By: Rhae Hammock RN Signed: 02/10/2022 5:48:31 PM By: [name] PA-C Entered By: Rhae Hammock on 02/10/2022 10:00:46 Brunker, Kiyo T ([id number]) [id number] Page 4 of 7 -------------------------------------------------------------------------------- Problem List Details Patient Name: Date of Service: LATONA, KRICHBAUM 02/10/2022 9:30 A M Medical Record Number: [id number] Patient Account Number: [id number] Date of Birth/Sex: Treating RN: April 27, 1940 (82 y.o. 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 02/10/2022 9:30 A M Medical Record Number: [id number] Patient Account Number: [id number] Date of Birth/Sex: Treating RN: July 13, 1940 (82 y.o. F) Primary Care Provider: [name] Other Clinician: Referring Provider: Treating Provider/Extender: [name] Weeks in Treatment: 16 Subjective Chief Complaint Information obtained from Patient Right ankle ulcer History of Present Illness (HPI) 10-21-2021 upon evaluation today patient appears to be doing poorly in regard to the wound over the medial aspect of her right ankle. This is an area that previously she has had trouble with previously. I have actually seen her in [place] about a year ago but this was on the left ankle at that time. With that being said she does  have a history of vasculitis which is often what causes this to open up. This is also the typical spot for her as it is a very difficult area to compress. Nonetheless previously we have been able to get this under control using her compression socks as well as Xeroform gauze although I am not sure that is can be the best thing for her at this  time we discussed that as we proceed. 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 Nonetheless I do think that we could potentially try some oral steroids, prednisone, to see if we can help calm this down. She is not opposed to this at all. In fact she would like to do anything she can to try to get better she tells me the Achilles area has been very painful at times for her. I really do not see any signs of infection right now but that symptomatic = as well. 10/18; patient with wounds looks a lot better today measuring smaller. She went to her grandsons wedding this weekend did not wear compression wraps. She completed her course of prednisone 50 mg we have been using TCA and Hydrofera Blue on the wound area. 11-11-2021 upon evaluation today patient appears to be doing well currently in regard to her wound in fact she is showing signs of good granulation and epithelization at this point and I am very pleased with where we stand currently. I do not see any signs of infection locally or systemically which is great news. No fevers, chills, nausea, vomiting, or diarrhea. I do believe that the anti-inflammatories did do well for her. 11-18-2021 upon evaluation today patient's wound is actually showing signs of excellent improvement and very pleased with where we were seeing this improvement thus far. I do not see any evidence of active infection locally or systemically which is great news and overall I do believe we are on the right track here. 11/8; patient comes in complaining of marked pain in this area. She takes Tylenol  for the discomfort. Unfortunately we are not making much progress in the condition of this wound still slough covered. 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 With that being said there does not appear to be any signs of infection which is good news but the wound does not appear to be quite as healthy as what I saw the last time I saw her in 2 weeks and looking back at pictures I really feel like that there were very small compared to where we started there was a little bit of discoloration of the base of the wound that was not there last week. Some of this was probably just due to bleeding which is understandable from and following debridement but at the same time I do believe that nonetheless she still would benefit from the compression wrapping currently. She is definitely more swollen today than  she was previous and she realizes this as well. 12-09-2021 upon evaluation today patient appears to be doing a little better in regard to her wound compared to last time although size wise is not much different it does seem to be improving with regard to the necrotic tissue that is turning more yellow and loosening up which is at least good news. With that being said I do believe that the patient would benefit from likely switching to Santyl this is good to me and we cannot wrap her and she is getting need to actually change this more frequently at home using her compression sock but I think that it may be a better way to go at the moment to see if we can get this cleaned up she is in agreement with that plan. If we get to where we can use collagen then we will consider going back to the wrap. 12-23-2021 upon  evaluation today patient's wound does have some necrotic tissue noted at this point and we are going to need and clean this away today. I discussed that with her. With that being said fortunately there does not appear to be any signs of infection locally or systemically which is great news and overall I am extremely pleased in that regard. Her husband is changing this daily and that does seem to be benefiting her at this point. The wound is a bit smaller today compared to previous evaluation. 01-06-2022 upon evaluation today patient unfortunately appears to be doing worse in regard to her wound. Actually got a call from her yesterday in [place] because she told me that her foot and ankle area was doing much worse with increased pain. Fortunately I do not see any signs of infection systemically though locally I do's definitely see signs of cellulitis. I discussed that with her today. Nonetheless I do believe that she would benefit from continuation of the antibiotics which I placed her on yesterday. This includes the Bactrim DS although we may need to add something different depend on what the results of the culture come back showing. We are going to do a PCR culture today. Subsequently I am also going to go ahead and give the patient gentamicin cream which I am going to send into the pharmacy for her at this point. 01/13/2022: The culture that was taken last week returned positive for Pseudomonas, which certainly would not be covered by the Bactrim that was prescribed. She is having more pain in the wound and continues to have blue-green drainage on her dressing. There is substantial slough within the wound. 01-20-2022 upon evaluation today patient appears to be doing well currently in regard to the her wound. This looks much better than last time I saw her. Fortunately she did get on the right antibiotic had to be switched to Levaquin actually came back positive that her culture showed Pseudomonas  only. Subsequently since being on the Levaquin and the wound has gotten much better she still has some burning but is not nearly as bad as what it was previous. 01-27-2022 upon evaluation today patient appears to be doing pretty well in regard to her wound compared to where things were previous. Fortunately I do not see any evidence of infection locally nor systemically which is great news and overall I do believe that we are headed in the right direction. Overall the patient's wound seems to be greatly improved. 02-03-2022 upon evaluation today patient's wound is actually showing signs of improvement which is great news. Fortunately  I do not see any evidence of worsening overall which is excellent and I do think she is headed in the appropriate direction. She is still having unfortunately discomfort but again I think this is definitely headed in the proper direction. 02-10-2022 upon evaluation today patient's wound is actually showing signs of being smaller and measuring better. Fortunately I do not see any evidence of infection and I was able to get the necrotic surface of the wound without any additional sharp debridement today. She actually tolerated this with minimal discomfort compared to what she was previously noted. Overall I am actually very pleased with where we stand currently. Objective Constitutional Well-nourished and well-hydrated in no acute distress. Vitals Time Taken: 9:27 AM, Temperature: 98 F, Pulse: 71 bpm, Respiratory Rate: 20 breaths/min, Blood Pressure: 151/68 mmHg. Respiratory normal breathing without difficulty. Psychiatric this patient is able to make decisions and demonstrates good insight into disease process. Alert and Oriented x 3. pleasant and cooperative. General Notes: Upon inspection patient's wound bed actually showed signs of good granulation epithelization at this point. Fortunately I do not see any evidence of infection locally nor systemically which is  great news and overall I am extremely pleased with where we stand. Integumentary (Hair, Skin) Wound #1 status is Open. Original cause of wound was Gradually Appeared. The date acquired was: 09/30/2021. The wound has been in treatment 16 weeks. [name], [name] ([id number]) [id number] Page 6 of 7 The wound is located on the Right,Medial Malleolus. The wound measures 0.8cm length x 0.4cm width x 0.2cm depth; 0.251cm^2 area and 0.05cm^3 volume. There is Fat Layer (Subcutaneous Tissue) exposed. There is no tunneling or undermining noted. There is a medium amount of purulent drainage noted. The wound margin is distinct with the outline attached to the wound base. There is medium (34-66%) pink granulation within the wound bed. There is a medium (34- 66%) amount of necrotic tissue within the wound bed including Adherent Slough. The periwound skin appearance did not exhibit: Callus, Crepitus, Excoriation, Induration, Rash, Scarring, Dry/Scaly, Maceration, Atrophie Blanche, Cyanosis, Ecchymosis, Hemosiderin Staining, Mottled, Pallor, Rubor, Erythema. Periwound temperature was noted as No Abnormality. The periwound has tenderness on palpation. 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 There was a Three Layer Compression Therapy Procedure by Rhae Hammock, RN. Post procedure Diagnosis Wound #1: Same as Pre-Procedure Plan Follow-up Appointments: Return Appointment in 1 week. Margarita Grizzle on Wednesday 02/17/22 @ 0930 Rm # 9 (already has appt.) Return Appointment in 2 weeks. - w/ Kimoni Pickerill ST and Lauran Rm # 9 on Wednesday (go ahead and make appt.) one Anesthetic: (In clinic) Topical Lidocaine 5%  applied to wound bed Cellular or Tissue Based Products: Cellular or Tissue Based Product Type: - Apligraf- RUN IVR 02/10/22 Bathing/ Shower/ Hygiene: May shower with protection but do not get wound dressing(s) wet. Protect dressing(s) with water repellant cover (for example, large plastic bag) or a cast cover and may then take shower. Edema Control - Lymphedema / SCD / Other: Elevate legs to the level of the heart or above for 30 minutes daily and/or when sitting for 3-4 times a day throughout the day. Avoid standing for long periods of time. Exercise regularly WOUND #1: - Malleolus Wound Laterality: Right, Medial Cleanser: Normal Saline (Generic) 1 x Per Week/30  Days Discharge Instructions: Cleanse the wound with Normal Saline prior to applying a clean dressing using gauze sponges, not tissue or cotton balls. Cleanser: Soap and Water 1 x Per Week/30 Days Discharge Instructions: May shower and wash wound with dial antibacterial soap and water prior to dressing change. Peri-Wound Care: Sween Lotion (Moisturizing lotion) 1 x Per Week/30 Days Discharge Instructions: Apply moisturizing lotion as directed Prim Dressing: IODOFLEX 0.9% Cadexomer Iodine Pad 4x6 cm 1 x Per Week/30 Days ary Discharge Instructions: Apply to wound bed as instructed Secondary Dressing: ABD Pad, 8x10 1 x Per Week/30 Days Discharge Instructions: Apply over primary dressing as directed. Com pression Wrap: ThreePress (3 layer compression wrap) 1 x Per Week/30 Days Discharge Instructions: Apply three layer compression as directed. 1. I am going to recommend that we have the patient continue the to utilize the current medication we are using the Iodosorb which is doing a great job. 2. I am also can recommend we continue with the ABD pad to cover followed by a 3 layer compression wrap. 3. I do believe the patient would benefit from using Apligraf to try to help close this wound as quickly as possible and again she is not  opposed to this. We are going to see if we can get that approval. We will see patient back for reevaluation in 1 week here in the clinic. If anything worsens or changes patient will contact our office for additional recommendations. Electronic Signature(s) Signed: 02/10/2022 5:36:42 PM By: [name] PA-C Entered By: [name] on 02/10/2022 17:36:42 Vanderberg, Vanesha T ([id number]) [id number] Page 7 of 7 -------------------------------------------------------------------------------- SuperBill Details Patient Name: Date of Service: [name], [name] 02/10/2022 Medical Record Number: [id number] Patient Account Number: [id number] Date of Birth/Sex: Treating RN: 1940-12-13 (82 y.o. [name], [name] Primary Care Provider: [name] Other Clinician: Referring Provider: Treating Provider/Extender: [name] Weeks in Treatment: 16 Diagnosis Coding ICD-10 Codes Code Description [phone] 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 I87.331 Chronic venous hypertension (idiopathic) with ulcer and inflammation of right lower extremity Facility Procedures : CPT4 Code: [id number] Description: (Facility Use Only) [id number] - [facility] L[facility] RT LEG Modifier: Quantity: 1 Physician Procedures : CPT4 Code Description Modifier [id number] - WC PHYS LEVEL 3 - EST PT ICD-10 Diagnosis Description W96.759 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 I87.331 Chronic venous hypertension (idiopathic)  with ulcer and inflammation of right lower extremity Quantity: 1 Electronic Signature(s) Signed: 02/10/2022 5:37:04 PM By: [name] PA-C Previous Signature: 02/10/2022 4:09:44 PM Version By: Rhae Hammock RN Entered By: [name] on 02/10/2022 17:37:04

## 2022-02-11 NOTE — Progress Notes (Signed)
[name], [name] ([id number]) [id number] Page 1 of 6 Visit Report for 02/10/2022 Arrival Information Details Patient Name: Date of Service: [name], [name] 02/10/2022 9:30 A M Medical Record Number: [id number] Patient Account Number: [id number] Date of Birth/Sex: Treating RN: 1940-03-16 (82 y.o. [name], Meta.Reding Primary Care Jaelyne Deeg: [name] Other Clinician: Referring Hadriel Northup: Treating Tasfia Vasseur/Extender: [name] Weeks in Treatment: 16 Visit Information History Since Last Visit Added or deleted any medications: No Patient Arrived: Ambulatory Any new allergies or adverse reactions: No Arrival Time: 09:26 Had a fall or experienced change in No Accompanied By: husband activities of daily living that may affect Transfer Assistance: None risk of falls: Patient Identification Verified: Yes Signs or symptoms of abuse/neglect since last visito No Secondary Verification Process Completed: Yes Hospitalized since last visit: No Patient Requires Transmission-Based Precautions: No Implantable device outside of the clinic excluding No Patient Has Alerts: No cellular tissue based products placed in the center since last visit: Has Dressing in Place as Prescribed: Yes Has Compression in Place as Prescribed: Yes Pain Present Now: No Electronic Signature(s) Signed: 02/10/2022 6:16:47 PM By: Deon Pilling RN, BSN Entered By: Deon Pilling on 02/10/2022 09:26:27 -------------------------------------------------------------------------------- Compression Therapy Details Patient Name: Date of Service: [name], [name]. 02/10/2022 9:30 A M Medical Record Number: [id number] Patient Account Number: [id number] Date of Birth/Sex: Treating RN: 1940/12/07 (82 y.o. [name], [name] Primary Care Teigen Parslow: [name] Other Clinician: Referring Yuma Blucher: Treating Alyssamae Klinck/Extender: [name] Weeks in Treatment: 16 Compression Therapy Performed for Wound Assessment: Wound #1 Right,Medial Malleolus Performed By: Clinician Rhae Hammock, RN Compression Type: Three Layer Post Procedure Diagnosis Same as Pre-procedure Electronic Signature(s) Signed: 02/10/2022 4:09:44 PM By: Rhae Hammock RN Entered By: Rhae Hammock on 02/10/2022 10:13:32 Wolaver, Dawsyn [name] ([id number]) [id number] Page 2 of 6 -------------------------------------------------------------------------------- Encounter Discharge Information Details Patient Name: Date of Service: [name], [name] 02/10/2022 9:30 A M Medical Record Number: [id number] Patient Account Number: [id number] Date of Birth/Sex: Treating RN: 1940/07/27 (82 y.o. [name], [name] Primary Care Asjah Rauda: [name] Other Clinician: Referring Darcy Barbara: Treating Simora Dingee/Extender: [name] Weeks in Treatment: 16 Encounter Discharge Information Items Discharge Condition: Stable Ambulatory Status: Ambulatory Discharge Destination: Home Transportation: Private Auto Accompanied By: husband Schedule Follow-up Appointment: Yes Clinical Summary of Care: Patient Declined Electronic Signature(s) Signed: 02/10/2022 4:09:44 PM By: Rhae Hammock RN Entered By: Rhae Hammock on 02/10/2022 10:16:19 -------------------------------------------------------------------------------- Lower Extremity Assessment Details Patient Name: Date of Service: [name], [name]. 02/10/2022 9:30 A M Medical Record Number: [id number] Patient Account Number: [id number] Date of Birth/Sex: Treating RN: May 08, 1940 (82 y.o. F) Primary Care Jigar Zielke: [name] Other Clinician: Referring Shawndell Schillaci: Treating Lavan Imes/Extender: [name] Weeks in Treatment: 16 Edema Assessment Assessed: [name]: No] [Right: No] Edema: [Left: N] [Right: o] Calf Left:  Right: Point of Measurement: 37 cm From Medial Instep 39.4 cm Ankle Left: Right: Point of Measurement: 8 cm From Medial Instep 21.5 cm Electronic Signature(s) Signed: 02/10/2022 4:38:50 PM By: Erenest Blank Entered By: Erenest Blank on 02/10/2022 09:36:19 -------------------------------------------------------------------------------- Multi-Disciplinary Care Plan Details Patient Name: Date of Service: [name], [name]. 02/10/2022 9:30 A M Medical Record Number: [id number] Patient Account Number: [id number] Date of Birth/Sex: Treating RN: 1940-02-28 (82 y.o. [name], [name] Primary Care Jaxten Brosh: [name] Other Clinician: Referring Cailah Reach: Treating Allis Quirarte/Extender: [name] Weeks in Treatment: [address], [place]  [name] ([id number]) [id number] Page 3 of 6 Active Inactive Wound/Skin Impairment Nursing Diagnoses: Impaired tissue integrity Knowledge deficit related to smoking impact on wound healing Goals: Patient will have a decrease in wound volume by X% from date: (specify in notes) Date Initiated: 10/21/2021 Target Resolution Date: 03/12/2022 Goal Status: Active Patient/caregiver will verbalize understanding of skin care regimen Date Initiated: 10/21/2021 Date Inactivated: 11/11/2021 Target Resolution Date: 11/21/2021 Goal Status: Met Ulcer/skin breakdown will have a volume reduction of 30% by week 4 Date Initiated: 10/21/2021 Date Inactivated: 01/13/2022 Target Resolution Date: 01/16/2022 Unmet Reason: see wound Goal Status: Unmet measurement. Interventions: Assess patient/caregiver ability to obtain necessary supplies Assess patient/caregiver ability to perform ulcer/skin care regimen upon admission and as needed Assess ulceration(s) every visit Notes: Electronic Signature(s) Signed: 02/10/2022 4:09:44 PM By: Rhae Hammock RN Entered By: Rhae Hammock on 02/10/2022  09:45:06 -------------------------------------------------------------------------------- Pain Assessment Details Patient Name: Date of Service: [name], [name]. 02/10/2022 9:30 A M Medical Record Number: [id number] Patient Account Number: [id number] Date of Birth/Sex: Treating RN: 1940/03/02 (82 y.o. [name], Tammi Klippel Primary Care Genesia Caslin: [name] Other Clinician: Referring Loukas Antonson: Treating Andrian Sabala/Extender: [name] Weeks in Treatment: 16 Active Problems Location of Pain Severity and Description of Pain Patient Has Paino No Site Locations Rate the pain. Current Pain Level: 0 Pain Management and Medication Current Pain Management: [name], [name] ([id number]) [id number] Page 4 of 6 Medication: No Cold Application: No Rest: No Massage: No Activity: No KeithE.N.S.: No Heat Application: No Leg drop or elevation: No Is the Current Pain Management Adequate: Adequate How does your wound impact your activities of daily livingo Sleep: No Bathing: No Appetite: No Relationship With Others: No Bladder Continence: No Emotions: No Bowel Continence: No Work: No Toileting: No Drive: No Dressing: No Hobbies: No Engineer, maintenance) Signed: 02/10/2022 6:16:47 PM By: Deon Pilling RN, BSN Entered By: Deon Pilling on 02/10/2022 09:27:45 -------------------------------------------------------------------------------- Patient/Caregiver Education Details Patient Name: Date of Service: [name] 1/24/2024andnbsp9:30 A M Medical Record Number: [id number] Patient Account Number: [id number] Date of Birth/Gender: Treating RN: [birth year] (82 y.o. [name], [place] Primary Care Physician: [name] Other Clinician: Referring Physician: Treating Physician/Extender: Karie Schwalbe in Treatment: 16 Education Assessment Education Provided To: Patient Education Topics  Provided Wound/Skin Impairment: Methods: Explain/Verbal Responses: Reinforcements needed, State content correctly Electronic Signature(s) Signed: 02/10/2022 4:09:44 PM By: Rhae Hammock RN Entered By: Rhae Hammock on 02/10/2022 09:45:20 -------------------------------------------------------------------------------- Wound Assessment Details Patient Name: Date of Service: [name], [name]. 02/10/2022 9:30 A M Medical Record Number: [id number] Patient Account Number: [id number] Date of Birth/Sex: Treating RN: [birth year] (82 y.o. F) Primary Care Rohaan Durnil: [name] Other Clinician: Referring Amna Welker: Treating Mirel Hundal/Extender: [name] Weeks in Treatment: 16 Wound Status Wound Number: 1 Primary Etiology: Venous Leg Ulcer Wound Location: Right, Medial Malleolus Wound Status: Open Wounding Event: Gradually Appeared Comorbid History: Peripheral Venous Disease Date Acquired: 09/30/2021 [name], [name] ([id number]) [phone].pdf Page 5 of 6 Weeks Of Treatment: 16 Clustered Wound: No Photos Wound Measurements Length: (cm) 0.8 Width: (cm) 0.4 Depth: (cm) 0.2 Area: (cm) 0.251 Volume: (cm) 0.05 % Reduction in Area: 85.8% % Reduction in Volume: 85.8% Epithelialization: Small (1-33%) Tunneling: No Undermining: No Wound Description Classification: Full Thickness With Exposed Suppo Wound Margin: Distinct, outline attached Exudate Amount: Medium Exudate Type: Purulent Exudate Color: yellow, brown, green rt Structures Foul Odor After Cleansing: No Slough/Fibrino Yes Wound Bed Granulation Amount: Medium (34-66%)  Exposed Structure Granulation Quality: Pink Fascia Exposed: No Necrotic Amount: Medium (34-66%) Fat Layer (Subcutaneous Tissue) Exposed: Yes Necrotic Quality: Adherent Slough Tendon Exposed: No Muscle Exposed: No Joint Exposed: No Bone Exposed: No Periwound Skin Texture Texture Color No  Abnormalities Noted: No No Abnormalities Noted: No Callus: No Atrophie Blanche: No Crepitus: No Cyanosis: No Excoriation: No Ecchymosis: No Induration: No Erythema: No Rash: No Hemosiderin Staining: No Scarring: No Mottled: No Pallor: No Moisture Rubor: No No Abnormalities Noted: No Dry / Scaly: No Temperature / Pain Maceration: No Temperature: No Abnormality Tenderness on Palpation: Yes Treatment Notes Wound #1 (Malleolus) Wound Laterality: Right, Medial Cleanser Normal Saline Discharge Instruction: Cleanse the wound with Normal Saline prior to applying a clean dressing using gauze sponges, not tissue or cotton balls. Soap and Water Discharge Instruction: May shower and wash wound with dial antibacterial soap and water prior to dressing change. Peri-Wound Care Sween Lotion (Moisturizing lotion) Discharge Instruction: Apply moisturizing lotion as directed Topical Primary Dressing [name], [name] ([id number]) [id number] Page 6 of 6 IODOFLEX 0.9% Cadexomer Iodine Pad 4x6 cm Discharge Instruction: Apply to wound bed as instructed Secondary Dressing ABD Pad, 8x10 Discharge Instruction: Apply over primary dressing as directed. Secured With Compression Wrap ThreePress (3 layer compression wrap) Discharge Instruction: Apply three layer compression as directed. Compression Stockings Add-Ons Electronic Signature(s) Signed: 02/10/2022 4:38:50 PM By: Erenest Blank Entered By: Erenest Blank on 02/10/2022 09:39:54 -------------------------------------------------------------------------------- Vitals Details Patient Name: Date of Service: [name], Rosamaria [name]. 02/10/2022 9:30 A M Medical Record Number: [id number] Patient Account Number: [id number] Date of Birth/Sex: Treating RN: 1940-08-28 (82 y.o. [name], Tammi Klippel Primary Care Alila Sotero: [name] Other Clinician: Referring Jewel Venditto: Treating Shanese Riemenschneider/Extender: [name] Weeks in Treatment: 16 Vital Signs Time Taken: 09:27 Temperature (F): 98 Pulse (bpm): 71 Respiratory Rate (breaths/min): 20 Blood Pressure (mmHg): 151/68 Reference Range: 80 - 120 mg / dl Electronic Signature(s) Signed: 02/10/2022 6:16:47 PM By: Deon Pilling RN, BSN Entered By: Deon Pilling on 02/10/2022 09:28:17

## 2022-02-13 ENCOUNTER — Other Ambulatory Visit: Payer: Self-pay | Admitting: Family Medicine

## 2022-02-15 NOTE — Telephone Encounter (Signed)
Refill request for  diazePAM 5 MG TABLET   LOV - 02/09/22 Next OV - not scheduled Last refill - 07/01/21 #30/1

## 2022-02-16 ENCOUNTER — Ambulatory Visit (INDEPENDENT_AMBULATORY_CARE_PROVIDER_SITE_OTHER): Payer: Medicare Other | Admitting: Family Medicine

## 2022-02-16 ENCOUNTER — Encounter: Payer: Self-pay | Admitting: Family Medicine

## 2022-02-16 VITALS — BP 132/60 | HR 67 | Temp 97.8°F | Ht 68.0 in | Wt 206.4 lb

## 2022-02-16 DIAGNOSIS — U071 COVID-19: Secondary | ICD-10-CM

## 2022-02-16 MED ORDER — AZITHROMYCIN 250 MG PO TABS: ORAL_TABLET | ORAL | 0 refills | Status: AC

## 2022-02-16 NOTE — Progress Notes (Signed)
Patient ID: [name], female    DOB: [birth year], 82 y.o.   MRN: [id number]  This visit was conducted in person.  BP 132/60   Pulse 67   Temp 97.8 F (36.6 C) (Oral)   Ht '5\' 8"'$  (1.727 m)   Wt 206 lb 6 oz (93.6 kg)   SpO2 97%   BMI 31.38 kg/m    CC:  Chief Complaint  Patient presents with   Cough    With green phelgm-Covid +-Seen by Dr. Diona Browner on 02/10/2022    Subjective:   HPI: [name] is a 82 y.o. female presenting on 02/16/2022 for      She was dx with COVID 1./23. Had symptoms 1 week prior... so now likely 2 week of symptoms.  She completed COVID medication.    Now with productive phlegm, green mucus.. nasal discharge   No fever, no face pain, no ear pain.   No SOB, no wheeze... that improved.  COPD, ashtma   Relevant past medical, surgical, family and social history reviewed and updated as indicated. Interim medical history since our last visit reviewed. Allergies and medications reviewed and updated. Outpatient Medications Prior to Visit  Medication Sig Dispense Refill   albuterol (VENTOLIN HFA) 108 (90 Base) MCG/ACT inhaler Inhale 1-2 puffs into the lungs every 8 (eight) hours as needed for wheezing (or for cough). Okay to fill with proair/ventolin/albuterol 8 g 1   atenolol (TENORMIN) 50 MG tablet Take 1 tablet (50 mg total) by mouth daily. 90 tablet 3   butalbital-acetaminophen-caffeine (FIORICET) 50-325-40 MG tablet TAKE ONE TABLET BY MOUTH TWICE A DAY AS NEEDED FOR FOR HEADACHE 60 tablet 0   Cholecalciferol (VITAMIN D) 50 MCG (2000 UT) CAPS Take 1 capsule (2,000 Units total) by mouth daily.     escitalopram (LEXAPRO) 5 MG tablet Take 1 tablet (5 mg total) by mouth daily. 90 tablet 3   FEROSUL 325 (65 Fe) MG tablet TAKE ONE TABLET BY MOUTH EVERY OTHER DAY 45 tablet 3   fluocinonide-emollient (LIDEX-E) 0.05 % cream APPLY TOPICALLY TO AFFECTED AREA(S) TWICE DAILY 60 g 2   gabapentin (NEURONTIN) 300 MG capsule Take up to 5 tabs a day for RLS  (1 in AM, 1 midday, 3 in PM), 150 capsule 5   ibuprofen (ADVIL) 800 MG tablet TAKE ONE TABLET BY MOUTH EVERY 8 HOURS AS NEEDED FOR MODERATE PAIN.  TAKE WITH FOOD 180 tablet 1   levothyroxine (SYNTHROID) 100 MCG tablet TAKE ONE TABLET BY MOUTH DAILY EXCEPT 1/2 TABLET ON SUNDAYS 90 tablet 3   Multiple Vitamin (MULTIVITAMIN) tablet Take 1 tablet by mouth daily.     omeprazole (PRILOSEC) 20 MG capsule Take 1 capsule (20 mg total) by mouth daily. 90 capsule 3   rOPINIRole (REQUIP) 2 MG tablet Take 1 tablet (2 mg total) by mouth in the morning and at bedtime. 180 tablet 3   simvastatin (ZOCOR) 40 MG tablet TAKE ONE TABLET BY MOUTH EVERY NIGHT AT BEDTIME 90 tablet 2   triamcinolone (KENALOG) 0.1 % paste APPLY A SMALL AMOUNT TO THE TONGUE LESION TWO TIMES A DAY FOR 10 DAYS 5 g 0   diazepam (VALIUM) 5 MG tablet TAKE 1/2 TO 1 TABLET BY MOUTH EVERY 12 HOURS AS NEEDED FOR ANXIETY 30 tablet 1   No facility-administered medications prior to visit.     Per HPI unless specifically indicated in ROS section below Review of Systems  Constitutional:  Negative for fatigue and fever.  HENT:  Positive  for congestion.   Eyes:  Negative for pain.  Respiratory:  Positive for cough. Negative for shortness of breath.   Cardiovascular:  Negative for chest pain, palpitations and leg swelling.  Gastrointestinal:  Negative for abdominal pain.  Genitourinary:  Negative for dysuria and vaginal bleeding.  Musculoskeletal:  Negative for back pain.  Neurological:  Negative for syncope, light-headedness and headaches.  Psychiatric/Behavioral:  Negative for dysphoric mood.    Objective:  BP 132/60   Pulse 67   Temp 97.8 F (36.6 C) (Oral)   Ht '5\' 8"'$  (1.727 m)   Wt 206 lb 6 oz (93.6 kg)   SpO2 97%   BMI 31.38 kg/m   Wt Readings from Last 3 Encounters:  02/16/22 206 lb 6 oz (93.6 kg)  02/09/22 202 lb 8 oz (91.9 kg)  10/20/21 209 lb (94.8 kg)      Physical Exam Constitutional:      General: She is not in acute  distress.    Appearance: She is well-developed. She is not ill-appearing or toxic-appearing.  HENT:     Head: Normocephalic.     Right Ear: Hearing, tympanic membrane, ear canal and external ear normal. Tympanic membrane is not erythematous, retracted or bulging.     Left Ear: Hearing, tympanic membrane, ear canal and external ear normal. Tympanic membrane is not erythematous, retracted or bulging.     Nose: Mucosal edema and rhinorrhea present.     Right Sinus: No maxillary sinus tenderness or frontal sinus tenderness.     Left Sinus: No maxillary sinus tenderness or frontal sinus tenderness.     Mouth/Throat:     Pharynx: Uvula midline.  Eyes:     General: Lids are normal. Lids are everted, no foreign bodies appreciated.     Conjunctiva/sclera: Conjunctivae normal.     Pupils: Pupils are equal, round, and reactive to light.  Neck:     Thyroid: No thyroid mass or thyromegaly.     Vascular: No carotid bruit.     Trachea: Trachea normal.  Cardiovascular:     Rate and Rhythm: Normal rate and regular rhythm.     Pulses: Normal pulses.     Heart sounds: Normal heart sounds, S1 normal and S2 normal. No murmur heard.    No friction rub. No gallop.  Pulmonary:     Effort: Pulmonary effort is normal. No tachypnea or respiratory distress.     Breath sounds: Normal breath sounds. No decreased breath sounds, wheezing, rhonchi or rales.  Musculoskeletal:     Cervical back: Normal range of motion and neck supple.  Skin:    General: Skin is warm and dry.     Findings: No rash.  Neurological:     Mental Status: She is alert.  Psychiatric:        Mood and Affect: Mood is not anxious or depressed.        Speech: Speech normal.        Behavior: Behavior normal. Behavior is cooperative.        Judgment: Judgment normal.       Results for orders placed or performed in visit on 02/09/22  POC COVID-19  Result Value Ref Range   SARS Coronavirus 2 Ag Positive (A) Negative    Assessment and  Plan  COVID Assessment & Plan: Acute, status posttreatment with Paxlovid.  Initial symptoms seem to be improving but now with likely bacterial superinfection. I will broaden coverage with azithromycin to cover for bacteria causing green productive cough. Lungs clear  to auscultation bilaterally  Return and ER precautions provided   Other orders -     Azithromycin; Take 2 tablets on day 1, then 1 tablet daily on days 2 through 5  Dispense: 6 tablet; Refill: 0    Return if symptoms worsen or fail to improve.   Eliezer Lofts, MD

## 2022-02-16 NOTE — Assessment & Plan Note (Signed)
Acute, status posttreatment with Paxlovid.  Initial symptoms seem to be improving but now with likely bacterial superinfection. I will broaden coverage with azithromycin to cover for bacteria causing green productive cough. Lungs clear to auscultation bilaterally  Return and ER precautions provided

## 2022-02-17 ENCOUNTER — Encounter (HOSPITAL_BASED_OUTPATIENT_CLINIC_OR_DEPARTMENT_OTHER): Payer: Medicare Other | Admitting: Physician Assistant

## 2022-02-17 DIAGNOSIS — I776 Arteritis, unspecified: Secondary | ICD-10-CM | POA: Diagnosis not present

## 2022-02-17 DIAGNOSIS — I87331 Chronic venous hypertension (idiopathic) with ulcer and inflammation of right lower extremity: Secondary | ICD-10-CM | POA: Diagnosis not present

## 2022-02-17 DIAGNOSIS — L97312 Non-pressure chronic ulcer of right ankle with fat layer exposed: Secondary | ICD-10-CM | POA: Diagnosis not present

## 2022-02-17 NOTE — Progress Notes (Addendum)
[name] ([id number]) [id number] Page 1 of 7 Visit Report for 02/17/2022 Chief Complaint Document Details Patient Name: Date of Service: [name], [name] 02/17/2022 9:30 A M Medical Record Number: [id number] Patient Account Number: [id number] Date of Birth/Sex: Treating RN: 1940/03/14 (82 y.o. F) Primary Care Provider: [name] Other Clinician: Referring Provider: Treating Provider/Extender: [name] Weeks in Treatment: 17 Information Obtained from: Patient Chief Complaint Right ankle ulcer Electronic Signature(s) Signed: 02/17/2022 9:37:46 AM By: [name] PA-C Entered By: [name] on 02/17/2022 09:37:46 -------------------------------------------------------------------------------- HPI Details Patient Name: Date of Service: Kim Catholic, Albertina [name]. 02/17/2022 9:30 A M Medical Record Number: [id number] Patient Account Number: [id number] Date of Birth/Sex: Treating RN: 03-19-1940 (82 y.o. F) Primary Care Provider: [name] Other Clinician: Referring Provider: Treating Provider/Extender: [name] Weeks in Treatment: 17 History of Present Illness HPI Description: 10-21-2021 upon evaluation today patient appears to be doing poorly in regard to the wound over the medial aspect of her right ankle. This is an area that previously she has had trouble with previously. I have actually seen her in [place] about a year ago but this was on the left ankle at that time. 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 This is also the typical spot for her as it is a very difficult area to compress. Nonetheless previously we have been able to get this under control using her compression socks as well as Xeroform gauze although I am not sure that is can be the best thing for her at this time we discussed that as we  proceed. 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 Nonetheless I do think that we could potentially try some oral steroids, prednisone, to see if we can help calm this down. She is not opposed to this at all. In fact she would like to do anything she can to try to get better she tells me the Achilles area has been very painful at times for her. I really do not see any signs of infection right now but that symptomatic = as well. 10/18; patient with wounds looks a lot better today measuring smaller. She went to her grandsons wedding this weekend did not wear compression wraps. She completed her course of prednisone 50 mg we have been using TCA and Hydrofera Blue on the wound area. 11-11-2021 upon evaluation today patient appears to be doing well currently in regard to her wound in fact she is showing signs of good granulation and epithelization at this point and I am very pleased with where we stand currently. I do not see any signs of infection locally or systemically which is great news. No fevers, chills, nausea, vomiting, or diarrhea. I do believe that the anti-inflammatories did do well for her. 11-18-2021 upon evaluation today patient's wound is actually showing signs of excellent improvement and very pleased with where we were seeing this improvement thus far. I do not see any evidence of active infection locally or systemically which is great news and overall I do believe we are on the  right track here. 11/8; patient comes in complaining of marked pain in this area. She takes Tylenol for the discomfort. Unfortunately we are not making much progress in the condition of this wound still slough  covered. 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 With that being said there does not appear to be any signs of infection which is good news but the wound does not appear to be quite as healthy as what I saw the last time I saw her in 2 weeks and looking back at pictures I really feel like that there were very small compared to where we started there was a little bit of discoloration of the base of the wound that was not there last [name] ([id number]) [id number] Page 2 of 7 week. Some of this was probably just due to bleeding which is understandable from and following debridement but at the same time I do believe that nonetheless she still would benefit from the compression wrapping currently. She is definitely more swollen today than she was previous and she realizes this as well. 12-09-2021 upon evaluation today patient appears to be doing a little better in regard to her wound compared to last time although size wise is not much different it does seem to be improving with regard to the necrotic tissue that is turning more yellow and loosening up which is at least good news. With that being said I do believe that the patient would benefit from likely switching to Santyl this is good to me and we cannot wrap her and she is getting need to actually change this more frequently at home using her compression sock but I think that it may be a better way to go at the moment to see if we can get this cleaned up she is in agreement with that plan. If we get to where we can use collagen then we will consider going back to the wrap. 12-23-2021 upon evaluation today patient's wound  does have some necrotic tissue noted at this point and we are going to need and clean this away today. I discussed that with her. With that being said fortunately there does not appear to be any signs of infection locally or systemically which is great news and overall I am extremely pleased in that regard. Her husband is changing this daily and that does seem to be benefiting her at this point. The wound is a bit smaller today compared to previous evaluation. 01-06-2022 upon evaluation today patient unfortunately appears to be doing worse in regard to her wound. Actually got a call from her yesterday in [place] because she told me that her foot and ankle area was doing much worse with increased pain. Fortunately I do not see any signs of infection systemically though locally I do's definitely see signs of cellulitis. I discussed that with her today. Nonetheless I do believe that she would benefit from continuation of the antibiotics which I placed her on yesterday. This includes the Bactrim DS although we may need to add something different depend on what the results of the culture come back showing. We are going to do a PCR culture today. Subsequently I am also going to go ahead and give the patient  gentamicin cream which I am going to send into the pharmacy for her at this point. 01/13/2022: The culture that was taken last week returned positive for Pseudomonas, which certainly would not be covered by the Bactrim that was prescribed. She is having more pain in the wound and continues to have blue-green drainage on her dressing. There is substantial slough within the wound. 01-20-2022 upon evaluation today patient appears to be doing well currently in regard to the her wound. This looks much better than last time I saw her. Fortunately she did get on the right antibiotic had to be switched to Levaquin actually came back positive that her culture showed Pseudomonas only. Subsequently since being on  the Levaquin and the wound has gotten much better she still has some burning but is not nearly as bad as what it was previous. 01-27-2022 upon evaluation today patient appears to be doing pretty well in regard to her wound compared to where things were previous. Fortunately I do not see any evidence of infection locally nor systemically which is great news and overall I do believe that we are headed in the right direction. Overall the patient's wound seems to be greatly improved. 02-03-2022 upon evaluation today patient's wound is actually showing signs of improvement which is great news. Fortunately I do not see any evidence of worsening overall which is excellent and I do think she is headed in the appropriate direction. She is still having unfortunately discomfort but again I think this is definitely headed in the proper direction. 02-10-2022 upon evaluation today patient's wound is actually showing signs of being smaller and measuring better. Fortunately I do not see any evidence of infection and I was able to get the necrotic surface of the wound without any additional sharp debridement today. She actually tolerated this with minimal discomfort compared to what she was previously noted. Overall I am actually very pleased with where we stand currently. 02-17-2022 upon evaluation today patient actually appears to be making some progress here in regard to her leg although this is still not as small as we like to see it is not as deep as it has been either. Fortunately I do not see any evidence of active infection locally nor systemically which is great news. No fevers, chills, nausea, vomiting, or diarrhea. Electronic Signature(s) Signed: 02/17/2022 12:53:12 PM By: [name] PA-C Entered By: [name] on 02/17/2022 12:53:11 -------------------------------------------------------------------------------- Physical Exam Details Patient Name: Date of Service: DALYLAH, RAMEY 02/17/2022  9:30 A M Medical Record Number: [id number] Patient Account Number: [id number] Date of Birth/Sex: Treating RN: 1940-06-22 (82 y.o. F) Primary Care Provider: [name] Other Clinician: Referring Provider: Treating Provider/Extender: [name] Weeks in Treatment: 31 Constitutional Well-nourished and well-hydrated in no acute distress. Respiratory normal breathing without difficulty. Psychiatric this patient is able to make decisions and demonstrates good insight into disease process. Alert and Oriented x 3. pleasant and cooperative. Notes Upon inspection patient's wound bed actually showed signs of good granulation epithelization at this point. Fortunately I do not see any signs of active infection which is great news. Overall I do believe that the patient is making good progress with regard to her leg although I do believe something like Apligraf could help to get this healed faster. We have gotten approval for this and I discussed that with the patient today we will get a plan to initiate treatment in 2 weeks. Electronic Signature(s) ULAH, OLMO [name] ([id number]) [id number]  Page 3 of 7 Signed: 02/17/2022 12:53:45 PM By: [name] PA-C Entered By: [name] on 02/17/2022 12:53:44 -------------------------------------------------------------------------------- Physician Orders Details Patient Name: Date of Service: Kim Catholic, Willene [name]. 02/17/2022 9:30 A M Medical Record Number: [id number] Patient Account Number: [id number] Date of Birth/Sex: Treating RN: 1940-12-27 (82 y.o. 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 - w/ [facility] and [facility] Rm # 9 on Wednesday 0930 03/03/2022 one Return A Nurse Visit: - ****CHANGE TO NURSE VISIT***** Wednesday 02/24/2022 0930 Room 9 [facility] APPT ALREADY IN PLACE. Anesthetic (In clinic) Topical Lidocaine 5% applied to wound bed Cellular or Tissue Based Products Cellular or Tissue Based Product Type: - Apligraf- RUN IVR 02/10/22 approved 20% coinsurance per application. will order and plan to apply apligraf 03/03/2022 0930. Bathing/ Shower/ Hygiene May shower with protection but do not get wound dressing(s) wet. Protect dressing(s) with water repellant cover (for example, large plastic bag) or a cast cover and may then take shower. Edema Control - Lymphedema / SCD / Other Elevate legs to the level of the heart or above for 30 minutes daily and/or when sitting for 3-4 times a day throughout the day. Avoid standing for long periods of time. Exercise regularly Wound Treatment Wound #1 - Malleolus Wound Laterality: Right, Medial Cleanser: Normal Saline (Generic) 1 x Per Week/30 Days Discharge Instructions: Cleanse the wound with Normal Saline prior to applying a clean dressing using gauze sponges, not tissue or cotton balls. Cleanser: Soap and Water 1 x Per Week/30 Days Discharge Instructions: May shower and wash wound with dial antibacterial soap and water prior to dressing change. Peri-Wound Care: Sween Lotion (Moisturizing lotion) 1 x Per Week/30 Days Discharge Instructions: Apply moisturizing lotion as directed Prim Dressing: IODOFLEX 0.9% Cadexomer Iodine Pad 4x6 cm 1 x Per Week/30 Days ary Discharge Instructions: Apply to wound bed as instructed Secondary Dressing: ABD Pad, 8x10 1 x Per Week/30 Days Discharge Instructions: Apply over primary dressing as directed. Compression Wrap: ThreePress (3 layer  compression wrap) 1 x Per Week/30 Days Discharge Instructions: Apply three layer compression as directed. Electronic Signature(s) Signed: 02/17/2022 4:50:42 PM By: [name] PA-C Signed: 02/19/2022 8:21:34 AM By: Rhae Hammock RN Entered By: Rhae Hammock on 02/17/2022 10:02:30 [name] ([id number]) [id number] Page 4 of 7 -------------------------------------------------------------------------------- Problem List Details Patient Name: Date of Service: ILEAN, SPRADLIN 02/17/2022 9:30 A M Medical Record Number: [id number] Patient Account Number: [id number] Date of Birth/Sex: Treating RN: 1940/02/24 (82 y.o. 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 02/17/2022 9:30 A M  Medical Record Number: [id number] Patient Account Number: [id number] Date of Birth/Sex: Treating RN: 10-Oct-1940 (82 y.o. F) Primary Care Provider: [name] Other Clinician: Referring Provider: Treating Provider/Extender: [name] Weeks in Treatment: 17 Subjective Chief Complaint Information obtained from Patient Right ankle ulcer History of Present Illness  (HPI) 10-21-2021 upon evaluation today patient appears to be doing poorly in regard to the wound over the medial aspect of her right ankle. This is an area that previously she has had trouble with previously. I have actually seen her in [place] about a year ago but this was on the left ankle at that time. 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 This is also the typical spot for her as it is a very difficult area to compress. Nonetheless previously we have been able to get this under control using her compression socks as well as Xeroform gauze although I am not sure that is can be the best thing for her at this time we discussed that as we proceed. PRANATHI, WINFREE ([id number]) [id number] Page 5 of 7 Patient does have chronic venous hypertension and a history of vasculitis but otherwise no major medical problems. 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 Nonetheless I do think that we could potentially try some oral steroids, prednisone, to see if we can help calm this down. She is not opposed to this at all. In fact she would like to do anything she can to try to get better she tells me the Achilles area has been very painful at times for her. I really do not see any signs of infection right now but that symptomatic = as well. 10/18; patient with wounds looks a lot better today measuring smaller. She went to her grandsons wedding this weekend did not wear compression wraps. She completed her course of prednisone 50 mg we have been using TCA and Hydrofera Blue on the wound area. 11-11-2021 upon evaluation today patient appears to be doing well currently in regard to her wound in fact she is showing signs of good  granulation and epithelization at this point and I am very pleased with where we stand currently. I do not see any signs of infection locally or systemically which is great news. No fevers, chills, nausea, vomiting, or diarrhea. I do believe that the anti-inflammatories did do well for her. 11-18-2021 upon evaluation today patient's wound is actually showing signs of excellent improvement and very pleased with where we were seeing this improvement thus far. I do not see any evidence of active infection locally or systemically which is great news and overall I do believe we are on the right track here. 11/8; patient comes in complaining of marked pain in this area. She takes Tylenol for the discomfort. Unfortunately we are not making much progress in the condition of this wound still slough covered. 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 With that being said there does  not appear to be any signs of infection which is good news but the wound does not appear to be quite as healthy as what I saw the last time I saw her in 2 weeks and looking back at pictures I really feel like that there were very small compared to where we started there was a little bit of discoloration of the base of the wound that was not there last week. Some of this was probably just due to bleeding which is understandable from and following debridement but at the same time I do believe that nonetheless she still would benefit from the compression wrapping currently. She is definitely more swollen today than she was previous and she realizes this as well. 12-09-2021 upon evaluation today patient appears to be doing a little better in  regard to her wound compared to last time although size wise is not much different it does seem to be improving with regard to the necrotic tissue that is turning more yellow and loosening up which is at least good news. With that being said I do believe that the patient would benefit from likely switching to Santyl this is good to me and we cannot wrap her and she is getting need to actually change this more frequently at home using her compression sock but I think that it may be a better way to go at the moment to see if we can get this cleaned up she is in agreement with that plan. If we get to where we can use collagen then we will consider going back to the wrap. 12-23-2021 upon evaluation today patient's wound does have some necrotic tissue noted at this point and we are going to need and clean this away today. I discussed that with her. With that being said fortunately there does not appear to be any signs of infection locally or systemically which is great news and overall I am extremely pleased in that regard. Her husband is changing this daily and that does seem to be benefiting her at this point. The wound is a bit smaller today compared to previous evaluation. 01-06-2022 upon evaluation today patient unfortunately appears to be doing worse in regard to her wound. Actually got a call from her yesterday in [place] because she told me that her foot and ankle area was doing much worse with increased pain. Fortunately I do not see any signs of infection systemically though locally I do's definitely see signs of cellulitis. I discussed that with her today. Nonetheless I do believe that she would benefit from continuation of the antibiotics which I placed her on yesterday. This includes the Bactrim DS although we may need to add something different depend on what the results of the culture come back showing. We are going to do a PCR culture today. Subsequently I am also going to go ahead and give  the patient gentamicin cream which I am going to send into the pharmacy for her at this point. 01/13/2022: The culture that was taken last week returned positive for Pseudomonas, which certainly would not be covered by the Bactrim that was prescribed. She is having more pain in the wound and continues to have blue-green drainage on her dressing. There is substantial slough within the wound. 01-20-2022 upon evaluation today patient appears to be doing well currently in regard to the her wound. This looks much better than last time I saw her. Fortunately she did get on the right antibiotic had to be switched to Levaquin  actually came back positive that her culture showed Pseudomonas only. Subsequently since being on the Levaquin and the wound has gotten much better she still has some burning but is not nearly as bad as what it was previous. 01-27-2022 upon evaluation today patient appears to be doing pretty well in regard to her wound compared to where things were previous. Fortunately I do not see any evidence of infection locally nor systemically which is great news and overall I do believe that we are headed in the right direction. Overall the patient's wound seems to be greatly improved. 02-03-2022 upon evaluation today patient's wound is actually showing signs of improvement which is great news. Fortunately I do not see any evidence of worsening overall which is excellent and I do think she is headed in the appropriate direction. She is still having unfortunately discomfort but again I think this is definitely headed in the proper direction. 02-10-2022 upon evaluation today patient's wound is actually showing signs of being smaller and measuring better. Fortunately I do not see any evidence of infection and I was able to get the necrotic surface of the wound without any additional sharp debridement today. She actually tolerated this with minimal discomfort compared to what she was previously noted.  Overall I am actually very pleased with where we stand currently. 02-17-2022 upon evaluation today patient actually appears to be making some progress here in regard to her leg although this is still not as small as we like to see it is not as deep as it has been either. Fortunately I do not see any evidence of active infection locally nor systemically which is great news. No fevers, chills, nausea, vomiting, or diarrhea. Objective Constitutional Well-nourished and well-hydrated in no acute distress. Vitals Time Taken: 9:25 AM, Temperature: 98.7 F, Pulse: 74 bpm, Respiratory Rate: 17 breaths/min, Blood Pressure: 134/74 mmHg. Respiratory normal breathing without difficulty. Psychiatric this patient is able to make decisions and demonstrates good insight into disease process. Alert and Oriented x 3. pleasant and cooperative. AMAIRANY, SCHUMPERT ([id number]) [id number] Page 6 of 7 General Notes: Upon inspection patient's wound bed actually showed signs of good granulation epithelization at this point. Fortunately I do not see any signs of active infection which is great news. Overall I do believe that the patient is making good progress with regard to her leg although I do believe something like Apligraf could help to get this healed faster. We have gotten approval for this and I discussed that with the patient today we will get a plan to initiate treatment in 2 weeks. Integumentary (Hair, Skin) Wound #1 status is Open. Original cause of wound was Gradually Appeared. The date acquired was: 09/30/2021. The wound has been in treatment 17 weeks. The wound is located on the Right,Medial Malleolus. The wound measures 1.1cm length x 1cm width x 0.2cm depth; 0.864cm^2 area and 0.173cm^3 volume. There is Fat Layer (Subcutaneous Tissue) exposed. There is a medium amount of purulent drainage noted. The wound margin is distinct with the outline attached to the wound base. There is  medium (34-66%) pink granulation within the wound bed. There is a medium (34-66%) amount of necrotic tissue within the wound bed including Adherent Slough. The periwound skin appearance did not exhibit: Callus, Crepitus, Excoriation, Induration, Rash, Scarring, Dry/Scaly, Maceration, Atrophie Blanche, Cyanosis, Ecchymosis, Hemosiderin Staining, Mottled, Pallor, Rubor, Erythema. Periwound temperature was noted as No Abnormality. The periwound has tenderness on palpation. 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 There was a Three Layer Compression Therapy Procedure by Rhae Hammock, RN. Post procedure Diagnosis Wound #1: Same as Pre-Procedure Plan Follow-up Appointments: Return Appointment in 2 weeks. - w/ Cristen Murcia ST and [facility] Rm # 9 on Wednesday 0930 03/03/2022 one Nurse Visit: - ****CHANGE TO NURSE VISIT***** Wednesday 02/24/2022 0930 Room 9 [facility] APPT ALREADY IN PLACE. Anesthetic: (In clinic) Topical Lidocaine 5% applied to wound bed Cellular or Tissue Based Products: Cellular or Tissue Based Product Type: - Apligraf- RUN IVR 02/10/22 approved 20% coinsurance per application. will order and plan to apply apligraf 03/03/2022 0930. Bathing/ Shower/ Hygiene: May shower with protection but do not get wound dressing(s) wet. Protect dressing(s) with water repellant cover (for example, large plastic bag) or a cast cover and may then take shower. Edema Control - Lymphedema / SCD / Other: Elevate legs to the level of the heart or above for 30 minutes daily and/or when sitting for 3-4 times a day throughout the day. Avoid standing for long periods of time. Exercise regularly WOUND #1: - Malleolus Wound  Laterality: Right, Medial Cleanser: Normal Saline (Generic) 1 x Per Week/30 Days Discharge Instructions: Cleanse the wound with Normal Saline prior to applying a clean dressing using gauze sponges, not tissue or cotton balls. Cleanser: Soap and Water 1 x Per Week/30 Days Discharge Instructions: May shower and wash wound with dial antibacterial soap and water prior to dressing change. Peri-Wound Care: Sween Lotion (Moisturizing lotion) 1 x Per Week/30 Days Discharge Instructions: Apply moisturizing lotion as directed Prim Dressing: IODOFLEX 0.9% Cadexomer Iodine Pad 4x6 cm 1 x Per Week/30 Days ary Discharge Instructions: Apply to wound bed as instructed Secondary Dressing: ABD Pad, 8x10 1 x Per Week/30 Days Discharge Instructions: Apply over primary dressing as directed. Com pression Wrap: ThreePress (3 layer compression wrap) 1 x Per Week/30 Days Discharge Instructions: Apply three layer compression as directed. 1. I am good recommend currently that we have the patient go ahead and initiate treatment today with a continuation of therapy with the Iodoflex which I think is doing quite well. 2. I am also can recommend that we should go ahead and see about getting the Apligraf ordered in 2 weeks time when I see her we will go ahead and apply this for the first application. 3. I am good recommend as well that she continue with ABD pad and 3 layer compression wrap after applying the Iodoflex. We will see patient back for reevaluation in 2 weeks here in the clinic. If anything worsens or changes patient will contact our office for additional recommendations. We will see her in 1 week for a nurse visit. ADLAI, NIEBLAS ([id number]) [id number] Page 7 of 7 Electronic Signature(s) Signed: 02/17/2022 12:54:25 PM By: [name] PA-C Entered By: [name] on 02/17/2022  12:54:24 -------------------------------------------------------------------------------- SuperBill Details Patient Name: Date of Service: Thora Lance [name]. 02/17/2022 Medical Record Number: [id number] Patient Account Number: [id number] Date of Birth/Sex: Treating RN: Sep 02, 1940 (82 y.o. [name], [name] Primary Care Provider: [name] Other Clinician: Referring Provider: Treating Provider/Extender: [name] Weeks in Treatment: 17 Diagnosis Coding ICD-10 Codes Code Description [phone] 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 I87.331 Chronic venous hypertension (idiopathic) with ulcer and inflammation of right lower  extremity Facility Procedures : CPT4 Code: [id number] Description: (Facility Use Only) [phone] - [place] LWR RT LEG Modifier: Quantity: 1 Physician Procedures : CPT4 Code Description Modifier [id number] - WC PHYS LEVEL 3 - EST PT ICD-10 Diagnosis Description I75.797 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

## 2022-02-19 NOTE — Progress Notes (Signed)
[name] ([id number]) [id number] Page 1 of 6 Visit Report for 02/17/2022 Arrival Information Details Patient Name: Date of Service: [name], [name] 02/17/2022 9:30 A M Medical Record Number: [id number] Patient Account Number: [id number] Date of Birth/Sex: Treating RN: 1940/04/23 (82 y.o. [name], [name] Primary Care Dazani Norby: [name] Other Clinician: Referring Vallerie Hentz: Treating Carlesha Seiple/Extender: [name] Weeks in Treatment: 105 Visit Information History Since Last Visit Added or deleted any medications: No Patient Arrived: Ambulatory Any new allergies or adverse reactions: No Arrival Time: 09:24 Had a fall or experienced change in No Accompanied By: husband activities of daily living that may affect Transfer Assistance: None risk of falls: Patient Identification Verified: Yes Signs or symptoms of abuse/neglect since last visito No Secondary Verification Process Completed: Yes Hospitalized since last visit: No Patient Requires Transmission-Based Precautions: No Implantable device outside of the clinic excluding No Patient Has Alerts: No cellular tissue based products placed in the center since last visit: Has Dressing in Place as Prescribed: Yes Has Compression in Place as Prescribed: Yes Pain Present Now: No Electronic Signature(s) Signed: 02/19/2022 8:21:34 AM By: Rhae Hammock RN Entered By: Rhae Hammock on 02/17/2022 09:24:29 -------------------------------------------------------------------------------- Compression Therapy Details Patient Name: Date of Service: [name], [name]. 02/17/2022 9:30 A M Medical Record Number: [id number] Patient Account Number: [id number] Date of Birth/Sex: Treating RN: 09-12-1940 (82 y.o. [name], [name] Primary Care Jakalyn Kratky: [name] Other Clinician: Referring Glade Strausser: Treating Camber Ninh/Extender: [name] Weeks in Treatment: 17 Compression Therapy Performed for Wound Assessment: Wound #1 Right,Medial Malleolus Performed By: Clinician Rhae Hammock, RN Compression Type: Three Layer Post Procedure Diagnosis Same as Pre-procedure Electronic Signature(s) Signed: 02/19/2022 8:21:34 AM By: Rhae Hammock RN Entered By: Rhae Hammock on 02/17/2022 09:56:20 [name] ([id number]) [id number] Page 2 of 6 -------------------------------------------------------------------------------- Encounter Discharge Information Details Patient Name: Date of Service: [name], [name] 02/17/2022 9:30 A M Medical Record Number: [id number] Patient Account Number: [id number] Date of Birth/Sex: Treating RN: 1940-11-07 (82 y.o. [name], [name] Primary Care Tabetha Haraway: [name] Other Clinician: Referring Braeleigh Pyper: Treating Tinnie Kunin/Extender: [name] Weeks in Treatment: 17 Encounter Discharge Information Items Discharge Condition: Stable Ambulatory Status: Ambulatory Discharge Destination: Home Transportation: Private Auto Accompanied By: spouse Schedule Follow-up Appointment: Yes Clinical Summary of Care: Electronic Signature(s) Signed: 02/19/2022 8:21:34 AM By: Rhae Hammock RN Entered By: Rhae Hammock on 02/17/2022 10:03:23 -------------------------------------------------------------------------------- Lower Extremity Assessment Details Patient Name: Date of Service: [name], [name]. 02/17/2022 9:30 A M Medical Record Number: [id number] Patient Account Number: [id number] Date of Birth/Sex: Treating RN: 03-20-1940 (82 y.o. [name], [name] Primary Care Issabela Lesko: [name] Other Clinician: Referring Jarret Torre: Treating Micaela Stith/Extender: [name] Weeks in Treatment: 17 Edema Assessment Assessed: [name]: No] Patrice Paradise: Yes] Edema: [Left: N] [Right: o] Calf Left:  Right: Point of Measurement: 37 cm From Medial Instep 39.4 cm Ankle Left: Right: Point of Measurement: 8 cm From Medial Instep 21.5 cm Vascular Assessment Pulses: Dorsalis Pedis Palpable: [Right:Yes] Posterior Tibial Palpable: [Right:Yes] Electronic Signature(s) Signed: 02/19/2022 8:21:34 AM By: Rhae Hammock RN Entered By: Rhae Hammock on 02/17/2022 09:31:08 [name] ([id number]) [id number] Page 3 of 6 -------------------------------------------------------------------------------- Multi-Disciplinary Care Plan Details Patient Name: Date of Service: [name], [name] 02/17/2022 9:30 A M Medical Record Number: [id number] Patient Account Number: [id number] Date of Birth/Sex: Treating RN: 01/05/1941 (82 y.o. [name], Prince Primary Care Ceylon Arenson: Damita Dunnings,  Purcell Mouton Other Clinician: Referring Amarisa Wilinski: Treating Woods Gangemi/Extender: [name] Weeks in Treatment: 17 Active Inactive Wound/Skin Impairment Nursing Diagnoses: Impaired tissue integrity Knowledge deficit related to smoking impact on wound healing Goals: Patient will have a decrease in wound volume by X% from date: (specify in notes) Date Initiated: 10/21/2021 Target Resolution Date: 03/12/2022 Goal Status: Active Patient/caregiver will verbalize understanding of skin care regimen Date Initiated: 10/21/2021 Date Inactivated: 11/11/2021 Target Resolution Date: 11/21/2021 Goal Status: Met Ulcer/skin breakdown will have a volume reduction of 30% by week 4 Date Initiated: 10/21/2021 Date Inactivated: 01/13/2022 Target Resolution Date: 01/16/2022 Unmet Reason: see wound Goal Status: Unmet measurement. Interventions: Assess patient/caregiver ability to obtain necessary supplies Assess patient/caregiver ability to perform ulcer/skin care regimen upon admission and as needed Assess ulceration(s) every visit Notes: Electronic Signature(s) Signed:  02/19/2022 8:21:34 AM By: Rhae Hammock RN Entered By: Rhae Hammock on 02/17/2022 09:55:30 -------------------------------------------------------------------------------- Pain Assessment Details Patient Name: Date of Service: [name], [name]. 02/17/2022 9:30 A M Medical Record Number: [id number] Patient Account Number: [id number] Date of Birth/Sex: Treating RN: 21-Mar-1940 (82 y.o. [name], [name] Primary Care Sevana Grandinetti: [name] Other Clinician: Referring Carla Whilden: Treating Marqueta Pulley/Extender: [name] Weeks in Treatment: 17 Active Problems Location of Pain Severity and Description of Pain Patient Has Paino No Site Locations [place], [place] [name] ([id number]) [id number] Page 4 of 6 Pain Management and Medication Current Pain Management: Electronic Signature(s) Signed: 02/19/2022 8:21:34 AM By: Rhae Hammock RN Entered By: Rhae Hammock on 02/17/2022 09:26:00 -------------------------------------------------------------------------------- Patient/Caregiver Education Details Patient Name: Date of Service: [name] 1/31/2024andnbsp9:30 A M Medical Record Number: [id number] Patient Account Number: [id number] Date of Birth/Gender: Treating RN: 06-11-1940 (82 y.o. [name], [name] Primary Care Physician: [name] Other Clinician: Referring Physician: Treating Physician/Extender: Karie Schwalbe in Treatment: 17 Education Assessment Education Provided To: Patient Education Topics Provided Wound/Skin Impairment: Handouts: Caring for Your Ulcer Methods: Explain/Verbal Responses: Reinforcements needed Electronic Signature(s) Signed: 02/19/2022 8:21:34 AM By: Rhae Hammock RN Entered By: Rhae Hammock on 02/17/2022 09:55:58 -------------------------------------------------------------------------------- Wound Assessment Details Patient Name: Date  of Service: [name], [name]. 02/17/2022 9:30 A M Medical Record Number: [id number] Patient Account Number: [id number] Date of Birth/Sex: Treating RN: 08-19-1940 (82 y.o. Benjaman Lobe Primary Care Yasmeen Manka: [name] Other Clinician: [name] ([id number]) [id number] Page 5 of 6 Referring Varonica Siharath: Treating Gunter Conde/Extender: [name] Weeks in Treatment: 17 Wound Status Wound Number: 1 Primary Etiology: Venous Leg Ulcer Wound Location: Right, Medial Malleolus Wound Status: Open Wounding Event: Gradually Appeared Comorbid History: Peripheral Venous Disease Date Acquired: 09/30/2021 Weeks Of Treatment: 17 Clustered Wound: No Photos Wound Measurements Length: (cm) 1.1 Width: (cm) 1 Depth: (cm) 0.2 Area: (cm) 0.864 Volume: (cm) 0.173 % Reduction in Area: 51.1% % Reduction in Volume: 51% Epithelialization: Small (1-33%) Wound Description Classification: Full Thickness With Exposed Suppor Wound Margin: Distinct, outline attached Exudate Amount: Medium Exudate Type: Purulent Exudate Color: yellow, brown, green [name] Structures Foul Odor After Cleansing: No Slough/Fibrino Yes Wound Bed Granulation Amount: Medium (34-66%) Exposed Structure Granulation Quality: Pink Fascia Exposed: No Necrotic Amount: Medium (34-66%) Fat Layer (Subcutaneous Tissue) Exposed: Yes Necrotic Quality: Adherent Slough Tendon Exposed: No Muscle Exposed: No Joint Exposed: No Bone Exposed: No Periwound Skin Texture Texture Color No Abnormalities Noted: No No Abnormalities Noted: No Callus: No Atrophie Blanche: No Crepitus: No Cyanosis: No Excoriation: No Ecchymosis: No Induration: No Erythema:  No Rash: No Hemosiderin Staining: No Scarring: No Mottled: No Pallor: No Moisture Rubor: No No Abnormalities Noted: No Dry / Scaly: No Temperature / Pain Maceration: No Temperature: No Abnormality Tenderness on  Palpation: Yes Treatment Notes Wound #1 (Malleolus) Wound Laterality: Right, Medial Cleanser Normal Saline Discharge Instruction: Cleanse the wound with Normal Saline prior to applying a clean dressing using gauze sponges, not tissue or cotton balls. Soap and Water Discharge Instruction: May shower and wash wound with dial antibacterial soap and water prior to dressing change. [name], [name] ([id number]) [id number] Page 6 of 6 Peri-Wound Care Sween Lotion (Moisturizing lotion) Discharge Instruction: Apply moisturizing lotion as directed Topical Primary Dressing IODOFLEX 0.9% Cadexomer Iodine Pad 4x6 cm Discharge Instruction: Apply to wound bed as instructed Secondary Dressing ABD Pad, 8x10 Discharge Instruction: Apply over primary dressing as directed. Secured With Compression Wrap ThreePress (3 layer compression wrap) Discharge Instruction: Apply three layer compression as directed. Compression Stockings Add-Ons Electronic Signature(s) Signed: 02/19/2022 8:21:34 AM By: Rhae Hammock RN Entered By: Rhae Hammock on 02/17/2022 09:33:24 -------------------------------------------------------------------------------- Vitals Details Patient Name: Date of Service: [name], [name]. 02/17/2022 9:30 A M Medical Record Number: [id number] Patient Account Number: [id number] Date of Birth/Sex: Treating RN: [birth year] (82 y.o. [name], [name] Primary Care Chloeanne Poteet: [name] Other Clinician: Referring Starr Urias: Treating Divya Munshi/Extender: [name] Weeks in Treatment: 17 Vital Signs Time Taken: 09:25 Temperature (F): 98.7 Pulse (bpm): 74 Respiratory Rate (breaths/min): 17 Blood Pressure (mmHg): 134/74 Reference Range: 80 - 120 mg / dl Electronic Signature(s) Signed: 02/19/2022 8:21:34 AM By: Rhae Hammock RN Entered By: Rhae Hammock on 02/17/2022 09:25:55

## 2022-02-24 ENCOUNTER — Encounter (HOSPITAL_BASED_OUTPATIENT_CLINIC_OR_DEPARTMENT_OTHER): Payer: Medicare Other | Attending: Physician Assistant | Admitting: Internal Medicine

## 2022-02-24 DIAGNOSIS — L97312 Non-pressure chronic ulcer of right ankle with fat layer exposed: Secondary | ICD-10-CM | POA: Insufficient documentation

## 2022-02-24 DIAGNOSIS — I87311 Chronic venous hypertension (idiopathic) with ulcer of right lower extremity: Secondary | ICD-10-CM | POA: Diagnosis not present

## 2022-02-24 DIAGNOSIS — I776 Arteritis, unspecified: Secondary | ICD-10-CM | POA: Diagnosis not present

## 2022-02-24 NOTE — Progress Notes (Signed)
JOHANNY, SEGERS ([id number]) [id number] Page 1 of 1 Visit Report for 02/24/2022 SuperBill Details Patient Name: Date of Service: [name], [name] 02/24/2022 Medical Record Number: [id number] Patient Account Number: [id number] Date of Birth/Sex: Treating RN: 1940-05-08 (81 y.o. 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

## 2022-02-24 NOTE — Progress Notes (Signed)
[name] ([id number]) [id number] Page 1 of 4 Visit Report for 02/24/2022 Arrival Information Details Patient Name: Date of Service: [name], [name] 02/24/2022 9:30 A M Medical Record Number: [id number] Patient Account Number: [id number] Date of Birth/Sex: Treating RN: 11/15/1940 (82 y.o. F) Primary Care [name]: [name] Other Clinician: Referring Parsa Rickett: Treating Cicilia Clinger/Extender: Earnest Conroy Weeks in Treatment: 18 Visit Information History Since Last Visit Added or deleted any medications: No Patient Arrived: Ambulatory Any new allergies or adverse reactions: No Arrival Time: 10:02 Had a fall or experienced change in No Accompanied By: Husband activities of daily living that may affect Transfer Assistance: None risk of falls: Patient Identification Verified: Yes Signs or symptoms of abuse/neglect since last visito No Secondary Verification Process Completed: Yes Hospitalized since last visit: No Patient Requires Transmission-Based Precautions: No Implantable device outside of the clinic excluding No Patient Has Alerts: No cellular tissue based products placed in the center since last visit: Has Compression in Place as Prescribed: Yes Pain Present Now: No Electronic Signature(s) Signed: 02/24/2022 4:14:43 PM By: Erenest Blank Entered By: Erenest Blank on 02/24/2022 10:02:26 -------------------------------------------------------------------------------- Compression Therapy Details Patient Name: Date of Service: [name], [name]. 02/24/2022 9:30 A M Medical Record Number: [id number] Patient Account Number: [id number] Date of Birth/Sex: Treating RN: 07-19-1940 (82 y.o. F) Primary Care Mikiah Demond: [name] Other Clinician: Referring Christe Tellez: Treating Dodi Leu/Extender: Earnest Conroy Weeks in Treatment: 18 Compression Therapy Performed for Wound Assessment: Wound #1  Right,Medial Malleolus Performed By: Clinician Erenest Blank, Compression Type: Three Layer Electronic Signature(s) Signed: 02/24/2022 4:14:43 PM By: Erenest Blank Entered By: Erenest Blank on 02/24/2022 10:03:23 -------------------------------------------------------------------------------- Encounter Discharge Information Details Patient Name: Date of Service: [name], [name]. 02/24/2022 9:30 A M Medical Record Number: [id number] Patient Account Number: [id number] Date of Birth/Sex: Treating RN: Sep 04, 1940 (82 y.o. [name], [name] ([id number]) [id number] Page 2 of 4 Primary Care Giuseppe Duchemin: [name] Other Clinician: Erenest Blank Referring Davison Ohms: Treating Tarris Delbene/Extender: Veneta Penton in Treatment: 18 Encounter Discharge Information Items Discharge Condition: Stable Ambulatory Status: Ambulatory Discharge Destination: Home Transportation: Other Accompanied By: husband Schedule Follow-up Appointment: Yes Clinical Summary of Care: Electronic Signature(s) Signed: 02/24/2022 4:14:43 PM By: Erenest Blank Entered By: Erenest Blank on 02/24/2022 10:04:40 -------------------------------------------------------------------------------- Patient/Caregiver Education Details Patient Name: Date of Service: [name] 2/7/2024andnbsp9:30 A M Medical Record Number: [id number] Patient Account Number: [id number] Date of Birth/Gender: Treating RN: 05-10-1940 (82 y.o. F) Primary Care Physician: [name] Other Clinician: Erenest Blank Referring Physician: Treating Physician/Extender: Veneta Penton in Treatment: 18 Education Assessment Education Provided To: Patient Education Topics Provided Electronic Signature(s) Signed: 02/24/2022 4:14:43 PM By: Erenest Blank Entered By: Erenest Blank on 02/24/2022  10:04:14 -------------------------------------------------------------------------------- Wound Assessment Details Patient Name: Date of Service: [name]. 02/24/2022 9:30 A M Medical Record Number: [id number] Patient Account Number: [id number] Date of Birth/Sex: Treating RN: 1941/01/13 (82 y.o. F) Primary Care Kirk Sampley: [name] Other Clinician: Referring Emryn Flanery: Treating Celestial Barnfield/Extender: Earnest Conroy Weeks in Treatment: 18 Wound Status Wound Number: 1 Primary Etiology: Venous Leg Ulcer Wound Location: Right, Medial Malleolus Wound Status: Open Wounding Event: Gradually Appeared Date Acquired: 09/30/2021 Weeks Of Treatment: 18 Clustered Wound: No Wound Measurements Redel, [name] ([id number]) Length: (cm) 1.1 Width: (cm) 1 Depth: (cm) 0.2 Area: (cm) 0.864 Volume: (cm) 0.173 [id number] Page 3 of 4 % Reduction in Area: 51.1% %  Reduction in Volume: 51% Wound Description Classification: Full Thickness With Exposed Support Exudate Amount: Medium Exudate Type: Purulent Exudate Color: yellow, brown, green Structures Periwound Skin Texture Texture Color No Abnormalities Noted: No No Abnormalities Noted: No Moisture No Abnormalities Noted: No Treatment Notes Wound #1 (Malleolus) Wound Laterality: Right, Medial Cleanser Normal Saline Discharge Instruction: Cleanse the wound with Normal Saline prior to applying a clean dressing using gauze sponges, not tissue or cotton balls. Soap and Water Discharge Instruction: May shower and wash wound with dial antibacterial soap and water prior to dressing change. Peri-Wound Care Sween Lotion (Moisturizing lotion) Discharge Instruction: Apply moisturizing lotion as directed Topical Primary Dressing IODOFLEX 0.9% Cadexomer Iodine Pad 4x6 cm Discharge Instruction: Apply to wound bed as instructed Secondary Dressing ABD Pad, 8x10 Discharge Instruction:  Apply over primary dressing as directed. Secured With Compression Wrap ThreePress (3 layer compression wrap) Discharge Instruction: Apply three layer compression as directed. Compression Stockings Add-Ons Electronic Signature(s) Signed: 02/24/2022 4:14:43 PM By: Erenest Blank Entered By: Erenest Blank on 02/24/2022 10:02:45 -------------------------------------------------------------------------------- Vitals Details Patient Name: Date of Service: [name], [name]. 02/24/2022 9:30 A M Medical Record Number: [id number] Patient Account Number: [id number] Date of Birth/Sex: Treating RN: 1940/11/13 (82 y.o. F) Primary Care Deaysia Grigoryan: [name] Other Clinician: Referring Phylisha Dix: Treating Holle Sprick/Extender: Earnest Conroy Weeks in Treatment: [address], [name] ([id number]) [id number] Page 4 of 4 Vital Signs Time Taken: 10:02 Reference Range: 80 - 120 mg / dl Electronic Signature(s) Signed: 02/24/2022 4:14:43 PM By: Erenest Blank Entered By: Erenest Blank on 02/24/2022 10:02:33

## 2022-03-03 ENCOUNTER — Encounter (HOSPITAL_BASED_OUTPATIENT_CLINIC_OR_DEPARTMENT_OTHER): Payer: Medicare Other | Admitting: Physician Assistant

## 2022-03-03 DIAGNOSIS — I776 Arteritis, unspecified: Secondary | ICD-10-CM | POA: Diagnosis not present

## 2022-03-03 DIAGNOSIS — L97312 Non-pressure chronic ulcer of right ankle with fat layer exposed: Secondary | ICD-10-CM | POA: Diagnosis not present

## 2022-03-03 DIAGNOSIS — I87311 Chronic venous hypertension (idiopathic) with ulcer of right lower extremity: Secondary | ICD-10-CM | POA: Diagnosis not present

## 2022-03-03 DIAGNOSIS — I872 Venous insufficiency (chronic) (peripheral): Secondary | ICD-10-CM | POA: Diagnosis not present

## 2022-03-03 NOTE — Progress Notes (Addendum)
Kim, [name] ([id number]) [id number] Page 1 of 9 Visit Report for 03/03/2022 Chief Complaint Document Details Patient Name: Date of Service: [name], [name]. 03/03/2022 9:30 A M Medical Record Number: [id number] Patient Account Number: [id number] Date of Birth/Sex: Treating RN: Dec 22, 1940 (82 y.o. F) Primary Care Provider: Leonides [name] Other Clinician: Referring Provider: Treating Provider/Extender: [name] in Treatment: 19 Information Obtained from: Patient Chief Complaint Right ankle ulcer Electronic Signature(s) Signed: 03/03/2022 9:49:37 AM By: [name] [name] Entered By: [name] on 03/03/2022 09:49:37 -------------------------------------------------------------------------------- Cellular or Tissue Based Product Details Patient Name: Date of Service: [name], [name]. 03/03/2022 9:30 A M Medical Record Number: [id number] Patient Account Number: [id number] Date of Birth/Sex: Treating RN: December 16, 1940 (82 y.o. Tonita [name], Kim Primary Care Provider: Leonides [name] Other Clinician: Referring Provider: Treating Provider/Extender: [name] in Treatment: 19 Cellular or Tissue Based Product Type Wound #1 Right,Medial Malleolus Applied to: Performed By: Physician [name], PA Cellular or Tissue Based Product Type: Apligraf Level of Consciousness (Pre-procedure): Awake and Alert Pre-procedure Verification/Time Out Yes - 10:06 Taken: Location: trunk / arms / legs Wound Size (sq cm): 0.54 Product Size (sq cm): 44 Waste Size (sq cm): 33 Waste Reason: wound size Amount of Product Applied (sq cm): 11 Instrument Used: Blade, Forceps, Scissors Lot #: [id number] Expiration Date: 03/09/2022 Fenestrated: Yes Instrument: Blade Reconstituted: Yes Solution Type: normal saline Solution Amount: 62m Lot #: [id number] Expiration Date:  06/17/2024 Secured: Yes Secured With: Steri-Strips Dressing Applied: Yes Primary Dressing: adaptic. gauze Procedural Pain: 0 Post Procedural Pain: 0 Response to Treatment: Procedure was tolerated well [name], [name] ([id number] Page 2 of 9 Level of Consciousness (Post- Awake and Alert procedure): Post Procedure Diagnosis Same as Pre-procedure Electronic Signature(s) Signed: 03/05/2022 12:08:46 PM By: BRhae HammockRN Signed: 03/05/2022 12:49:23 PM By: SWorthy KeelerPA-C Previous Signature: 03/03/2022 5:47:19 PM Version By: SWorthy KeelerPA-C Entered By: BRhae Hammockon 03/05/2022 12:07:02 -------------------------------------------------------------------------------- Debridement Details Patient Name: Date of Service: CMalena [name] [name]. 03/03/2022 9:30 A M Medical Record Number: [id number] Account Number: [id number] of Birth/Sex: Treating RN: [birth year](82y.o. FTonita [name] Kim Primary Care Provider: DLeonides CaveOther Clinician: Referring Provider: Treating Provider/Extender: SChanda BusingWeeks in Treatment: 19 Debridement Performed for Assessment: Wound #1 Right,Medial Malleolus Performed By: Physician SWorthy Keeler PA Debridement Type: Debridement Severity of Tissue Pre Debridement: Fat layer exposed Level of Consciousness (Pre-procedure): Awake and Alert Pre-procedure Verification/Time Out Yes - 09:54 Taken: Start Time: 09:54 Pain Control: Lidocaine [name] Area Debrided (L x W): otal 0.9 (cm) x 0.6 (cm) = 0.54 (cm) Tissue and other material debrided: Viable, Non-Viable, Slough, Subcutaneous, Slough Level: Skin/Subcutaneous Tissue Debridement Description: Excisional Instrument: Curette Bleeding: Minimum Hemostasis Achieved: Pressure End Time: 09:54 Procedural Pain: 0 Post Procedural Pain: 0 Response to Treatment: Procedure was tolerated well Level of Consciousness  (Post- Awake and Alert procedure): Post Debridement Measurements of Total Wound Length: (cm) 0.9 Width: (cm) 0.6 Depth: (cm) 0.2 Volume: (cm) 0.085 Character of Wound/Ulcer Post Debridement: Improved Severity of Tissue Post Debridement: Fat layer exposed Post Procedure Diagnosis Same as Pre-procedure Electronic Signature(s) Signed: 03/05/2022 12:08:46 PM By: BRhae HammockRN Signed: 03/05/2022 12:49:23 PM By: SWorthy KeelerPA-C Previous Signature: 03/03/2022 5:47:19 PM Version By: SWorthy KeelerPA-C Entered By: BRhae Hammockon 03/05/2022 12:06:54 [name], [name] ([id number] Page 3  of 9 -------------------------------------------------------------------------------- HPI Details Patient Name: Date of Service: [name], [name]. 03/03/2022 9:30 A M Medical Record Number: [id number] Patient Account Number: [id number] Date of Birth/Sex: Treating RN: 1940-11-04 (82 y.o. F) Primary Care Provider: Leonides [name] Other Clinician: Referring Provider: Treating Provider/Extender: [name] in Treatment: 19 History of Present Illness HPI Description: 10-21-2021 upon evaluation today patient appears to be doing poorly in regard to the wound over the medial aspect of her right ankle. This is an area that previously she has had trouble with previously. I have actually seen her in [place] about a year ago but this was on the left ankle at that time. 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 This is also the typical spot for her as it is a very difficult area to compress. Nonetheless previously we have been able to get this under control using her compression socks as well as Xeroform gauze although I am not sure that is can be the best thing for her at this time we discussed that as we proceed. Patient does have chronic venous hypertension and a  history of vasculitis but otherwise no major medical problems. 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 Nonetheless I do think that we could potentially try some oral steroids, prednisone, to see if we can help calm this down. She is not opposed to this at all. In fact she would like to do anything she can to try to get better she tells me the Achilles area has been very painful at times for her. I really do not see any signs of infection right now but that symptomatic = as well. 10/18; patient with wounds looks a lot better today measuring smaller. She went to her grandsons wedding this weekend did not wear compression wraps. She completed her course of prednisone 50 mg we have been using TCA and Hydrofera Blue on the wound area. 11-11-2021 upon evaluation today patient appears to be doing well currently in regard to her wound in fact she is showing signs of good granulation and epithelization at this point and I am very pleased with where we stand currently. I do not see any signs of infection locally or systemically which is great news. No fevers, chills, nausea, vomiting, or diarrhea. I do believe that the anti-inflammatories did do well for her. 11-18-2021 upon evaluation today patient's wound is actually showing signs of excellent improvement and very pleased with where we were seeing this improvement thus far. I do not see any evidence of active infection locally or systemically which is great news and overall I do believe we are on the right track here. 11/8; patient comes in complaining of marked pain in this area. She takes Tylenol for the discomfort. Unfortunately we are not making much progress in the condition of this wound still slough covered. 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 With that being said there  does not appear to be any signs of infection which is good news but the wound does not appear to be quite as healthy as what I saw the last time I saw her in 2 [name] and looking back at pictures I really feel like that there were very small compared to where we started there was a little bit of discoloration of the base of the wound that was not there last week. Some of this was probably just due to bleeding which is understandable from and following debridement but at the same time I do believe that nonetheless she still would benefit from the compression wrapping currently. She is definitely more swollen today than she was previous and she realizes this as well. 12-09-2021 upon evaluation today patient appears to be doing a little better in regard to her wound compared to last time although size wise is not much different it does seem to be improving with regard to the necrotic tissue that is turning more yellow and loosening up which is at least good news. With that being said I do believe that the patient would benefit from likely switching to Santyl this is good to me and we cannot wrap her and she is getting need to actually change this more frequently at home using her compression sock but I think that it may be a better way to go at the moment to see if we can get this cleaned up she is in agreement with that plan. If we get to where we can use collagen then we will consider going back to the wrap. 12-23-2021 upon evaluation today patient's wound does have some necrotic tissue noted at this point and we are going to need and clean this away today. I discussed that with her. With that  being said fortunately there does not appear to be any signs of infection locally or systemically which is great news and overall I am extremely pleased in that regard. Her husband is changing this daily and that does seem to be benefiting her at this point. The wound is a bit smaller today compared to previous evaluation. 01-06-2022 upon evaluation today patient unfortunately appears to be doing worse in regard to her wound. Actually got a call from her yesterday in [place] because she told me that her foot and ankle area was doing much worse with increased pain. Fortunately I do not see any signs of infection systemically though locally I do's definitely see signs of cellulitis. I discussed that with her today. Nonetheless I do believe that she would benefit from continuation of the antibiotics which I placed her on yesterday. This includes the Bactrim DS although we may need to add something different depend on what the results of the culture come back showing. We are going to do a PCR culture today. Subsequently I am also going to go ahead and give the patient gentamicin cream which I am going to send into the pharmacy for her at this point. 01/13/2022: The culture that was taken last week returned positive for Pseudomonas, which certainly would not be covered by the Bactrim that was prescribed. She is having more pain in the wound and continues to have blue-green drainage on her dressing. There is substantial slough within the wound. 01-20-2022 upon evaluation today patient appears to be doing well currently in regard to the her wound. This looks much better than last time I saw her. Fortunately she did get on the right antibiotic had to be switched to Levaquin  actually came back positive that her culture showed Pseudomonas only. Subsequently since being on the Levaquin and the wound has gotten much better she still has some burning but is not nearly as bad as what it was previous. 01-27-2022 upon  evaluation today patient appears to be doing pretty well in regard to her wound compared to where things were previous. Fortunately I do not see any evidence of infection locally nor systemically which is great news and overall I do believe that we are headed in the right direction. Overall the patient's wound seems to be greatly improved. 02-03-2022 upon evaluation today patient's wound is actually showing signs of improvement which is great news. Fortunately I do not see any evidence of worsening overall which is excellent and I do think she is headed in the appropriate direction. She is still having unfortunately discomfort but again I think this is definitely headed in the proper direction. 02-10-2022 upon evaluation today patient's wound is actually showing signs of being smaller and measuring better. Fortunately I do not see any evidence of infection and I was able to get the necrotic surface of the wound without any additional sharp debridement today. She actually tolerated this with minimal Branscum, Madilynne [name] ([id number]) [phone].pdf Page 4 of 9 discomfort compared to what she was previously noted. Overall I am actually very pleased with where we stand currently. 02-17-2022 upon evaluation today patient actually appears to be making some progress here in regard to her leg although this is still not as small as we like to see it is not as deep as it has been either. Fortunately I do not see any evidence of active infection locally nor systemically which is great news. No fevers, chills, nausea, vomiting, or diarrhea. 03-03-2022 upon evaluation today patient appears to be doing well currently in regard to her wounds. She has been tolerating the dressing changes without complication and fortunately there does not appear to be any signs of active infection locally nor systemically at this point. No fevers, chills, nausea, vomiting, or diarrhea. I think she is definitely ready  for Apligraf application today. Electronic Signature(s) Signed: 03/03/2022 5:37:02 PM By: [name] [name] Entered By: [name] on 03/03/2022 17:37:01 -------------------------------------------------------------------------------- Physical Exam Details Patient Name: Date of Service: [name], [name] 03/03/2022 9:30 A M Medical Record Number: [id number] Patient Account Number: [id number] Date of Birth/Sex: Treating RN: 11-29-1940 (82 y.o. F) Primary Care Provider: Leonides [name] Other Clinician: Referring Provider: Treating Provider/Extender: [name] in Treatment: 21 Constitutional Well-nourished and well-hydrated in no acute distress. Respiratory normal breathing without difficulty. Psychiatric this patient is able to make decisions and demonstrates good insight into disease process. Alert and Oriented x 3. pleasant and cooperative. Notes Upon inspection patient's wound bed actually showed signs of excellent granulation epithelization at this point. Overall I am extremely pleased with where we stand I do believe that she is headed in the right direction and I am happy in that regard. I do think however Apligraf will help to get this healed much more quickly and working to get that started for her today. Electronic Signature(s) Signed: 03/03/2022 5:37:20 PM By: [name] [name] Entered By: [name] on 03/03/2022 17:37:19 -------------------------------------------------------------------------------- Physician Orders Details Patient Name: Date of Service: [name], [name]. 03/03/2022 9:30 A M Medical Record Number: [id number] Patient Account Number: [id number] Date of Birth/Sex: Treating RN: 09/15/1940 (82 y.o. 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 - w/ Jeri Cos and [facility] Rm # 9 Wednesday 03/10/22 @ 9:30 (already has appt.) Return A ppointment in 2 [name]. - w/ Jeri Cos and [facility] Rm # 9 Wednesday 03/17/22 @ 9:30 (already has appt.) Return A Anesthetic (In clinic) Topical Lidocaine 5% applied to wound bed Cellular or Tissue Based Products Cellular or Tissue Based Product Type: - Apligraf- RUN IVR 02/10/22 approved 20% coinsurance per application. will order and plan to apply apligraf 03/03/2022 0930. Apligraf # 1 applied 03/03/22 Bathing/ Shower/ Hygiene May shower with protection but do not get wound dressing(s) wet. Protect dressing(s) with water repellant cover (for example, large plastic bag) or a cast cover and may then take shower. Edema Control - Lymphedema / SCD / Other Elevate legs to the level of the heart or above for 30 minutes daily and/or when sitting for 3-4 times a day throughout the day. Avoid standing for long periods of time. Exercise regularly Wound Treatment Wound #1 - Malleolus Wound Laterality: Right, Medial Cleanser: Normal Saline (Generic) 1 x Per Week/30 Days Discharge Instructions: Cleanse the wound with Normal Saline prior to applying a clean dressing using gauze sponges, not tissue or cotton balls. Cleanser: Soap and Water 1 x Per Week/30 Days Discharge Instructions: May shower and wash wound with dial antibacterial soap and water prior to dressing change. Peri-Wound Care: Sween Lotion (Moisturizing lotion) 1 x Per Week/30 Days Discharge Instructions: Apply moisturizing lotion as directed Prim Dressing: Apligraf # 1 ary 1 x Per Week/30  Days Prim Dressing: adaptic and steri strips ary 1 x Per Week/30 Days Secondary Dressing: ABD Pad, 8x10 1 x Per Week/30 Days Discharge Instructions: Apply over primary dressing as directed. Compression Wrap: ThreePress (3 layer compression wrap) 1 x Per Week/30 Days Discharge Instructions: Apply three layer compression as directed. Electronic Signature(s) Signed: 03/05/2022 12:08:46 PM By: Kim Hammock RN Signed: 03/05/2022 12:49:23 PM By: [name] [name] Previous Signature: 03/03/2022 5:47:19 PM Version By: [name] [name] Entered By: [name] on 03/05/2022 12:07:14 -------------------------------------------------------------------------------- Problem List Details Patient Name: Date of Service: [name], [name]. 03/03/2022 9:30 A M Medical Record Number: [id number] Patient Account Number: [id number] Date of Birth/Sex: Treating RN: 02-09-1940 (82 y.o. 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 03/03/2022 9:30 A M Medical Record Number: [id number] Patient Account Number: [id number] Date of Birth/Sex: Treating  RN: 1940/11/17 (82 y.o. F) Primary Care Provider: Leonides [name] Other Clinician: Referring Provider: Treating Provider/Extender: [name]  [name] [name] in Treatment: 19 Subjective Chief Complaint Information obtained from Patient Right ankle ulcer History of Present Illness (HPI) 10-21-2021 upon evaluation today patient appears to be doing poorly in regard to the wound over the medial aspect of her right ankle. This is an area that previously she has had trouble with previously. I have actually seen her in [place] about a year ago but this was on the left ankle at that time. 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 This is also the typical spot for her as it is a very difficult area to compress. Nonetheless previously we have been able to get this under control using her compression socks as well as Xeroform gauze although I am not sure that is can be the best thing for her at this time we discussed that as we proceed. 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 Nonetheless I do think that we could potentially try some oral steroids, prednisone, to see if we can help calm this down. She is not opposed to this at all. In fact she would like to do anything she can to try to get better she tells me the Achilles area has been very painful at times for her. I really do not see any signs of infection right now but that symptomatic = as well. 10/18; patient with wounds looks a lot better today measuring smaller. She went to her grandsons wedding this weekend did not wear compression wraps. She completed her  course of prednisone 50 mg we have been using TCA and Hydrofera Blue on the wound area. 11-11-2021 upon evaluation today patient appears to be doing well currently in regard to her wound in fact she is showing signs of good granulation and epithelization at this point and I am very pleased with where we stand currently. I do not see any signs of infection locally or systemically which is great news. No fevers, chills, nausea, vomiting, or diarrhea. I do believe that the anti-inflammatories did do well for her. 11-18-2021 upon evaluation today patient's wound is actually showing signs of excellent improvement and very pleased with where we were seeing this improvement thus far. I do not see any evidence of active infection locally or systemically which is great news and overall I do believe we are on the right track here. 11/8; patient comes in complaining of marked pain in this area. She takes Tylenol for the discomfort. Unfortunately we are not making much progress in the condition of this wound still slough covered. 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 With that being said there does not appear to be any signs of infection which is good news but the wound does not appear to be quite as healthy as what I saw the last time I saw her in [name] and looking back at pictures I  really feel like that there were very small compared to where we started there was a little bit of discoloration of the base of the wound that was not there last week. Some of this was probably just due to bleeding which is understandable from and following debridement but at the same time I do believe that nonetheless she still would  benefit from the compression wrapping currently. She is definitely more swollen today than she was previous and she realizes this as well. 12-09-2021 upon evaluation today patient appears to be doing a little better in regard to her wound compared to last time although size wise is not much different it does seem to be improving with regard to the necrotic tissue that is turning more yellow and loosening up which is at least good news. With that being said I do believe that the patient would benefit from likely switching to Santyl this is good to me and we cannot wrap her and she is getting need to [name], [name] ([id number]) [id number] Page 7 of 9 actually change this more frequently at home using her compression sock but I think that it may be a better way to go at the moment to see if we can get this cleaned up she is in agreement with that plan. If we get to where we can use collagen then we will consider going back to the wrap. 12-23-2021 upon evaluation today patient's wound does have some necrotic tissue noted at this point and we are going to need and clean this away today. I discussed that with her. With that being said fortunately there does not appear to be any signs of infection locally or systemically which is great news and overall I am extremely pleased in that regard. Her husband is changing this daily and that does seem to be benefiting her at this point. The wound is a bit smaller today compared to previous evaluation. 01-06-2022 upon evaluation today patient unfortunately appears to be doing worse in regard to her wound. Actually got a call from her yesterday in [place] because she told me that her foot and ankle area was doing much worse with increased pain. Fortunately I do not see any signs of infection systemically though locally I do's definitely see signs of cellulitis. I discussed that with her today. Nonetheless I do believe that she would  benefit from continuation of the antibiotics which I placed her on yesterday. This includes the Bactrim DS although we may need to add something different depend on what the results of the culture come back showing. We are going to do a PCR culture today. Subsequently I am also going to go ahead and give the patient gentamicin cream which I am going to send into the pharmacy for her at this point. 01/13/2022: The culture that was taken last week returned positive for Pseudomonas, which certainly would not be covered by the Bactrim that was prescribed. She is having more pain in the wound and continues to have blue-green drainage on her dressing. There is substantial slough within the wound. 01-20-2022 upon evaluation today patient appears to be doing well currently in regard to the her wound. This looks much better than last time I saw her. Fortunately she did get on the right antibiotic had to be switched to Levaquin actually came back positive that her culture showed Pseudomonas only. Subsequently since being on the Levaquin and the wound has gotten much better she still has some burning but is not nearly as  bad as what it was previous. 01-27-2022 upon evaluation today patient appears to be doing pretty well in regard to her wound compared to where things were previous. Fortunately I do not see any evidence of infection locally nor systemically which is great news and overall I do believe that we are headed in the right direction. Overall the patient's wound seems to be greatly improved. 02-03-2022 upon evaluation today patient's wound is actually showing signs of improvement which is great news. Fortunately I do not see any evidence of worsening overall which is excellent and I do think she is headed in the appropriate direction. She is still having unfortunately discomfort but again I think this is definitely headed in the proper direction. 02-10-2022 upon evaluation today patient's wound is actually  showing signs of being smaller and measuring better. Fortunately I do not see any evidence of infection and I was able to get the necrotic surface of the wound without any additional sharp debridement today. She actually tolerated this with minimal discomfort compared to what she was previously noted. Overall I am actually very pleased with where we stand currently. 02-17-2022 upon evaluation today patient actually appears to be making some progress here in regard to her leg although this is still not as small as we like to see it is not as deep as it has been either. Fortunately I do not see any evidence of active infection locally nor systemically which is great news. No fevers, chills, nausea, vomiting, or diarrhea. 03-03-2022 upon evaluation today patient appears to be doing well currently in regard to her wounds. She has been tolerating the dressing changes without complication and fortunately there does not appear to be any signs of active infection locally nor systemically at this point. No fevers, chills, nausea, vomiting, or diarrhea. I think she is definitely ready for Apligraf application today. Objective Constitutional Well-nourished and well-hydrated in no acute distress. Vitals Time Taken: 9:25 AM, Temperature: 97.9 F, Pulse: 68 bpm, Respiratory Rate: 18 breaths/min, Blood Pressure: 148/81 mmHg. Respiratory normal breathing without difficulty. Psychiatric this patient is able to make decisions and demonstrates good insight into disease process. Alert and Oriented x 3. pleasant and cooperative. General Notes: Upon inspection patient's wound bed actually showed signs of excellent granulation epithelization at this point. Overall I am extremely pleased with where we stand I do believe that she is headed in the right direction and I am happy in that regard. I do think however Apligraf will help to get this healed much more quickly and working to get that started for her  today. Integumentary (Hair, Skin) Wound #1 status is Open. Original cause of wound was Gradually Appeared. The date acquired was: 09/30/2021. The wound has been in treatment 19 [name]. The wound is located on the Right,Medial Malleolus. The wound measures 0.9cm length x 0.6cm width x 0.2cm depth; 0.424cm^2 area and 0.085cm^3 volume. There is no tunneling or undermining noted. There is a medium amount of purulent drainage noted. There is small (1-33%) granulation within the wound bed. There is a large (67-100%) amount of necrotic tissue within the wound bed including Adherent Slough. The periwound skin appearance did not exhibit: Callus, Crepitus, Excoriation, Induration, Rash, Scarring, Dry/Scaly, Maceration, Atrophie Blanche, Cyanosis, Ecchymosis, Hemosiderin Staining, Mottled, Pallor, Rubor, Erythema. Periwound temperature was noted as No Abnormality. 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 There was a Excisional Skin/Subcutaneous Tissue Debridement with a total area of 0.54 sq cm performed by [name], PA. With the following instrument(s): Curette to remove Viable and Non-Viable tissue/material. Material removed includes Subcutaneous Tissue and Slough and after achieving pain control using Lidocaine. No specimens were taken. A time out was conducted at 09:54, prior to the start of the procedure. A Minimum amount of bleeding was controlled with Pressure. The procedure was tolerated well with a pain level of 0 throughout and a pain level of 0 following the procedure.  Post Debridement Measurements: 0.9cm length x 0.6cm width x 0.2cm depth; 0.085cm^3 volume. Character of Wound/Ulcer Post Debridement is improved. Severity of Tissue Post Debridement is: Fat layer exposed. Post procedure Diagnosis Wound #1: Same as Pre-Procedure Pre-procedure diagnosis of Wound #1 is a Venous Leg Ulcer located on the Right,Medial Malleolus. A skin graft procedure using a bioengineered skin substitute/cellular or tissue based product was performed by [name], PA with the following instrument(s): Blade, Forceps, and Scissors. Apligraf was applied and secured with Steri-Strips. 11 sq cm of product was utilized and 33 sq cm was wasted due to wound size. Post Application, adaptic. gauze was applied. A Time Out was conducted at 10:06, prior to the start of the procedure. The procedure was tolerated well with a pain level of 0 throughout and a pain level of 0 following the procedure. Post procedure Diagnosis Wound #1: Same as Pre-Procedure . Plan Follow-up Appointments: Return Appointment in 1 week. - w/ Jeri Cos and Lauran Rm # 9 Wednesday 03/10/22 @ 9:30 (already has appt.) Return Appointment in 2 [name]. - w/ Jeri Cos and Lauran Rm # 9 Wednesday 03/17/22 @ 9:30 (already has appt.) Anesthetic: (In clinic) Topical Lidocaine 5% applied to wound bed Cellular or Tissue Based Products: Cellular or Tissue Based Product Type: - Apligraf- RUN IVR 02/10/22 approved 20% coinsurance per application. will order and plan to apply apligraf 03/03/2022 0930. Apligraf # 1 applied 03/03/22 Bathing/ Shower/ Hygiene: May shower with protection but do not get wound dressing(s) wet. Protect dressing(s) with water repellant cover (for example, large plastic bag) or a cast cover and may then take shower. Edema Control - Lymphedema / SCD / Other: Elevate legs to the level of the heart or above for 30 minutes daily and/or when sitting for 3-4 times a day throughout the day. Avoid standing for  long periods of time. Exercise regularly WOUND #1: - Malleolus Wound Laterality: Right, Medial Cleanser: Normal Saline (Generic) 1 x Per Week/30 Days Discharge Instructions: Cleanse the wound with Normal Saline prior to applying a clean dressing using gauze sponges, not tissue or cotton balls. Cleanser: Soap and Water 1 x Per Week/30 Days Discharge Instructions: May shower and wash wound with dial antibacterial soap and water prior to dressing change. Peri-Wound Care: Sween Lotion (Moisturizing lotion) 1 x Per Week/30 Days Discharge Instructions: Apply moisturizing lotion as directed Prim Dressing: Apligraf # 1 1 x Per Week/30 Days ary Prim Dressing: adaptic and steri strips 1 x Per Week/30 Days ary Secondary Dressing: ABD Pad, 8x10 1 x Per Week/30 Days Discharge Instructions: Apply over primary dressing as directed. Com pression Wrap: ThreePress (3 layer compression wrap) 1 x Per Week/30 Days Discharge Instructions: Apply three layer compression as directed. 1. I would suggest we have the patient  continue to monitor for any signs of infection or worsening. I do believe that we are on the right track in general and I am hopeful that we will be able to continue to see improvement especially with the Apligraf towards complete closure. 2. I am also going to recommend that we have her continue with the compression wrapping this is a 3 layer compression wrap which is doing quite well I am hopeful also that her pain would not be as bad since she will not have the Iodoflex in place. We will see patient back for reevaluation in 1 week here in the clinic. If anything worsens or changes patient will contact our office for additional recommendations. Electronic Signature(s) Signed: 03/09/2022 8:33:17 AM By: [name] [name] Signed: 03/09/2022 4:50:49 PM By: Deon Pilling RN, BSN Previous Signature: 03/03/2022 5:37:56 PM Version By: [name] [name] Entered By: Deon Pilling on 03/09/2022  07:56:12 Jurado, Suhana [name] ([id number] Page 9 of 9 -------------------------------------------------------------------------------- SuperBill Details Patient Name: Date of Service: JADEYN, MINNIS 03/03/2022 Medical Record Number: [id number] Patient Account Number: [id number] Date of Birth/Sex: Treating RN: [birth year] (82 y.o. F) Primary Care Provider: Leonides [name] Other Clinician: Referring Provider: Treating Provider/Extender: [name] in Treatment: 19 Diagnosis Coding ICD-10 Codes Code Description [phone] 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 I87.331 Chronic venous hypertension (idiopathic) with ulcer and inflammation of right lower extremity Facility Procedures : CPT4 Code: [id number] Description: (Facility Use Only) Apligraf 1 SQ CM Modifier: Quantity: 44 : CPT4 Code: [id number] Description: [id number] - SKIN SUB GRAFT TRNK/ARM/LEG ICD-10 Diagnosis Description [id number] 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 I87.331 Chronic venous hypertension (idiopathic) with ulcer and inflammation  of right lo Modifier: wer extremity Quantity: 1 Physician Procedures : CPT4 Code Description Modifier [id number] - WC PHYS SKIN SUB GRAFT TRNK/ARM/LEG ICD-10 Diagnosis Description [id number] 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

## 2022-03-08 NOTE — Progress Notes (Signed)
[name], [name] ([id number]) [id number] Page 1 of 6 Visit Report for 02/03/2022 Arrival Information Details Patient Name: Date of Service: [name], [name] 02/03/2022 9:30 A M Medical Record Number: [id number] Patient Account Number: [id number] Date of Birth/Sex: Treating RN: [name] (82 y.o. F) Primary Care [name]: [name] Other Clinician: Referring [name]: Treating [name]/Extender: [name] Weeks in Treatment: 15 Visit Information History Since Last Visit Added or deleted any medications: No Patient Arrived: Ambulatory Any new allergies or adverse reactions: No Arrival Time: 09:43 Had a fall or experienced change in No Accompanied By: husband activities of daily living that may affect Transfer Assistance: None risk of falls: Patient Identification Verified: Yes Signs or symptoms of abuse/neglect since last visito No Secondary Verification Process Completed: Yes Hospitalized since last visit: No Patient Requires Transmission-Based Precautions: No Implantable device outside of the clinic excluding No Patient Has Alerts: No cellular tissue based products placed in the center since last visit: Has Dressing in Place as Prescribed: Yes Pain Present Now: No Electronic Signature(s) Signed: 02/03/2022 4:25:20 PM By: [name] Entered By: [name] on 02/03/2022 09:44:21 -------------------------------------------------------------------------------- Compression Therapy Details Patient Name: Date of Service: [name], [name]. 02/03/2022 9:30 A M Medical Record Number: [id number] Patient Account Number: [id number] Date of Birth/Sex: Treating RN: [birth year] (82 y.o. [name], [name] Primary Care [name]: [name] Other Clinician: Referring [name]: Treating [name]/Extender: [name] Weeks in Treatment: 15 Compression Therapy Performed for Wound  Assessment: Wound #1 Right,Medial Malleolus Performed By: Clinician Kim Hammock, RN Compression Type: Three Layer Post Procedure Diagnosis Same as Pre-procedure Electronic Signature(s) Signed: 03/08/2022 10:40:58 AM By: Kim Hammock RN Entered By: [name] on 02/03/2022 10:23:37 [name], [name] ([id number] Page 2 of 6 -------------------------------------------------------------------------------- Encounter Discharge Information Details Patient Name: Date of Service: [name], RAIL 02/03/2022 9:30 A M Medical Record Number: [id number] Patient Account Number: [id number] Date of Birth/Sex: Treating RN: [name] (82 y.o. [name], [name] Primary Care [name]: [name] Other Clinician: Referring Starlett Pehrson: Treating [name]/Extender: [name] Weeks in Treatment: 15 Encounter Discharge Information Items Post Procedure Vitals Discharge Condition: Stable Temperature (F): 98.7 Ambulatory Status: Ambulatory Pulse (bpm): 74 Discharge Destination: Home Respiratory Rate (breaths/min): 17 Transportation: Private Auto Blood Pressure (mmHg): 134/74 Accompanied By: self Schedule Follow-up Appointment: Yes Clinical Summary of Care: Patient Declined Electronic Signature(s) Signed: 03/08/2022 10:40:58 AM By: Kim Hammock RN Entered By: [name] on 02/03/2022 10:28:47 -------------------------------------------------------------------------------- Lower Extremity Assessment Details Patient Name: Date of Service: [name], [name]. 02/03/2022 9:30 A M Medical Record Number: [id number] Patient Account Number: [id number] Date of Birth/Sex: Treating RN: [birth year] [name] (82 y.o. F) Primary Care [name]: [name] Other Clinician: Referring [name]: Treating [name]/Extender: [name] Weeks in Treatment: 15 Edema Assessment Assessed: [name]:  No] [Right: No] Edema: [Left: N] [Right: o] Calf Left: Right: Point of Measurement: 37 cm From Medial Instep 39 cm Ankle Left: Right: Point of Measurement: 8 cm From Medial Instep 22 cm Electronic Signature(s) Signed: 02/03/2022 4:25:20 PM By: [name] Entered By: [name] on 02/03/2022 09:52:32 -------------------------------------------------------------------------------- Multi-Disciplinary Care Plan Details Patient Name: Date of Service: [name], [name]. 02/03/2022 9:30 A M Medical Record Number: [id number] Patient Account Number: [id number] Date of Birth/Sex: Treating RN: [birth year] (82 y.o. [name], [name] Primary Care [name]: [name] Other Clinician: Referring [name]: Treating [name]/Extender: [name]  Weeks in Treatment: [address] [name] ([id number]) [id number] Page 3 of 6 Active Inactive Wound/Skin Impairment Nursing Diagnoses: Impaired tissue integrity Knowledge deficit related to smoking impact on wound healing Goals: Patient will have a decrease in wound volume by X% from date: (specify in notes) Date Initiated: 10/21/2021 Target Resolution Date: 03/12/2022 Goal Status: Active Patient/caregiver will verbalize understanding of skin care regimen Date Initiated: 10/21/2021 Date Inactivated: 11/11/2021 Target Resolution Date: 11/21/2021 Goal Status: Met Ulcer/skin breakdown will have a volume reduction of 30% by week 4 Date Initiated: 10/21/2021 Date Inactivated: 01/13/2022 Target Resolution Date: 01/16/2022 Unmet Reason: see wound Goal Status: Unmet measurement. Interventions: Assess patient/caregiver ability to obtain necessary supplies Assess patient/caregiver ability to perform ulcer/skin care regimen upon admission and as needed Assess ulceration(s) every visit Notes: Electronic Signature(s) Signed: 03/08/2022 10:40:58 AM By: Kim Hammock RN Entered By:  [name] on 02/03/2022 10:20:59 -------------------------------------------------------------------------------- Pain Assessment Details Patient Name: Date of Service: [name], [name]. 02/03/2022 9:30 A M Medical Record Number: [id number] Patient Account Number: [id number] Date of Birth/Sex: Treating RN: [birth year] [name] (82 y.o. F) Primary Care Palestine Mosco: [name] Other Clinician: Referring Morgon Pamer: Treating Kerriann Kamphuis/Extender: [name] Weeks in Treatment: 15 Active Problems Location of Pain Severity and Description of Pain Patient Has Paino No Site Locations Pain Management and Medication Current Pain Management: [name], [name] ([id number]) [phone].pdf Page 4 of 6 Notes has shooting pain at times Electronic Signature(s) Signed: 02/03/2022 4:25:20 PM By: [name] Entered By: [name] on 02/03/2022 09:44:44 -------------------------------------------------------------------------------- Patient/Caregiver Education Details Patient Name: Date of Service: [name] 1/17/2024andnbsp9:30 A M Medical Record Number: [id number] Patient Account Number: [id number] Date of Birth/Gender: Treating RN: [name] (82 y.o. [name], Naselle Primary Care Physician: [name] Other Clinician: Referring Physician: Treating Physician/Extender: [name] Weeks in Treatment: 15 Education Assessment Education Provided To: Patient Education Topics Provided Wound/Skin Impairment: Methods: Explain/Verbal Responses: Reinforcements needed, State content correctly Electronic Signature(s) Signed: 03/08/2022 10:40:58 AM By: Kim Hammock RN Entered By: [name] on 02/03/2022 10:21:09 -------------------------------------------------------------------------------- Wound Assessment Details Patient Name: Date of Service: [name], [name]. 02/03/2022 9:30 A  M Medical Record Number: [id number] Patient Account Number: [id number] Date of Birth/Sex: Treating RN: [birth year] (82 y.o. F) Primary Care Tauren Delbuono: [name] Other Clinician: Referring Siddhant Hashemi: Treating Dashanique Brownstein/Extender: [name] Weeks in Treatment: 15 Wound Status Wound Number: 1 Primary Etiology: Venous Leg Ulcer Wound Location: Right, Medial Malleolus Wound Status: Open Wounding Event: Gradually Appeared Comorbid History: Peripheral Venous Disease Date Acquired: 09/30/2021 Weeks Of Treatment: 15 Clustered Wound: No Photos [name], [name] ([id number]) [id number] Page 5 of 6 Wound Measurements Length: (cm) 0.9 Width: (cm) 0.4 Depth: (cm) 0.3 Area: (cm) 0.283 Volume: (cm) 0.085 % Reduction in Area: 84% % Reduction in Volume: 75.9% Epithelialization: Small (1-33%) Tunneling: No Undermining: No Wound Description Classification: Full Thickness With Exposed Suppo Wound Margin: Distinct, outline attached Exudate Amount: Medium Exudate Type: Purulent Exudate Color: yellow, brown, green rt Structures Foul Odor After Cleansing: No Slough/Fibrino Yes Wound Bed Granulation Amount: Medium (34-66%) Exposed Structure Granulation Quality: Pink Fascia Exposed: No Necrotic Amount: Medium (34-66%) Fat Layer (Subcutaneous Tissue) Exposed: Yes Necrotic Quality: Adherent Slough Tendon Exposed: No Muscle Exposed: No Joint Exposed: No Bone Exposed: No Periwound Skin Texture Texture Color No Abnormalities Noted: No No Abnormalities Noted: No Callus: No Atrophie Blanche: No Crepitus: No Cyanosis: No Excoriation: No Ecchymosis: No Induration: No Erythema: No Rash:  No Hemosiderin Staining: No Scarring: No Mottled: No Pallor: No Moisture Rubor: No No Abnormalities Noted: No Dry / Scaly: No Temperature / Pain Maceration: No Temperature: No Abnormality Tenderness on Palpation: Yes Electronic  Signature(s) Signed: 02/03/2022 4:25:20 PM By: [name] Entered By: [name] on 02/03/2022 09:59:40 -------------------------------------------------------------------------------- Vitals Details Patient Name: Date of Service: [name], [name]. 02/03/2022 9:30 A M Medical Record Number: [id number] Patient Account Number: [id number] Date of Birth/Sex: Treating RN: [name][birth year] (82 y.o. F) Primary Care Koron Godeaux: [name] Other Clinician: Referring Ren Aspinall: Treating Reshma Hoey/Extender: [name] Weeks in Treatment: [address] [place], [place] [name] ([id number]) [id number] Page 6 of 6 Time Taken: 09:46 Temperature (F): 98 Pulse (bpm): 61 Respiratory Rate (breaths/min): 18 Blood Pressure (mmHg): 140/64 Reference Range: 80 - 120 mg / dl Electronic Signature(s) Signed: 02/03/2022 4:25:20 PM By: [name] Entered By: [name] on 02/03/2022 09:46:51

## 2022-03-10 ENCOUNTER — Encounter (HOSPITAL_BASED_OUTPATIENT_CLINIC_OR_DEPARTMENT_OTHER): Payer: Medicare Other | Admitting: Physician Assistant

## 2022-03-10 DIAGNOSIS — I776 Arteritis, unspecified: Secondary | ICD-10-CM | POA: Diagnosis not present

## 2022-03-10 DIAGNOSIS — L97312 Non-pressure chronic ulcer of right ankle with fat layer exposed: Secondary | ICD-10-CM | POA: Diagnosis not present

## 2022-03-10 DIAGNOSIS — I87311 Chronic venous hypertension (idiopathic) with ulcer of right lower extremity: Secondary | ICD-10-CM | POA: Diagnosis not present

## 2022-03-10 DIAGNOSIS — I872 Venous insufficiency (chronic) (peripheral): Secondary | ICD-10-CM | POA: Diagnosis not present

## 2022-03-10 NOTE — Progress Notes (Addendum)
STACE, NEAS ([id number]) [id number] Page 1 of 8 Visit Report for 03/10/2022 Chief Complaint Document Details Patient Name: Date of Service: [name], [name] 03/10/2022 9:30 A M Medical Record Number: [id number] Patient Account Number: [id number] Date of Birth/Sex: Treating RN: [birth year] (81 y.o. F) Primary Care Provider: [name] Other Clinician: Referring Provider: Treating Provider/Extender: [name] Weeks in Treatment: 20 Information Obtained from: Patient Chief Complaint Right ankle ulcer Electronic Signature(s) Signed: 03/10/2022 9:33:56 AM By: [name] PA-Kim Entered By: [name] on 03/10/2022 09:33:56 -------------------------------------------------------------------------------- Cellular or Tissue Based Product Details Patient Name: Date of Service: [name], [name] 03/10/2022 9:30 A M Medical Record Number: [id number] Patient Account Number: [id number] Date of Birth/Sex: Treating RN: [birth year] (82 y.o. [name], [name] Primary Care Provider: [name] Other Clinician: Referring Provider: Treating Provider/Extender: [name] Weeks in Treatment: 20 Cellular or Tissue Based Product Type Wound #1 Right,Medial Malleolus Applied to: Performed By: Physician [name], PA Cellular or Tissue Based Product Type: Apligraf Level of Consciousness (Pre-procedure): Awake and Alert Pre-procedure Verification/Time Out Yes - 10:17 Taken: Location: trunk / arms / legs Wound Size (sq cm): 0.4 Product Size (sq cm): 44 Waste Size (sq cm): 38 Waste Reason: wound size Amount of Product Applied (sq cm): 6 Instrument Used: Forceps, Scissors Lot #: [id number] Expiration Date: 03/12/2022 Fenestrated: Yes Instrument: Blade Reconstituted: Yes Solution Type: normal saline Solution Amount: 90m Lot #: [id number] Expiration Date:  06/17/2024 Secured: Yes Secured With: Steri-Strips Dressing Applied: Yes Primary Dressing: adaptic. gauze Procedural Pain: 0 Post Procedural Pain: 0 Response to Treatment: Procedure was tolerated well Level of Consciousness (Post- Awake and Alert procedure): Post Procedure Diagnosis Same as Pre-procedure Electronic Signature(s) Signed: 03/10/2022 5:24:38 PM By: SEdger House Elika T ([id number] Page 2 of 8 Signed: 03/15/2022 3:50:45 PM By: BRhae HammockRN Entered By: BRhae Hammockon 03/10/2022 10:19:30 -------------------------------------------------------------------------------- HPI Details Patient Name: Date of Service: [name] 03/10/2022 9:30 A M Medical Record Number: [id number] Account Number: [id number] of Birth/Sex: Treating RN: 9Oct 25, 1942(82y.o. F) Primary Care Provider: DLeonides CaveOther Clinician: Referring Provider: Treating Provider/Extender: SChanda BusingWeeks in Treatment: 20 History of Present Illness HPI Description: 10-21-2021 upon evaluation today patient appears to be doing poorly in regard to the wound over the medial aspect of her right ankle. This is an area that previously she has had trouble with previously. [place] the [place] ankle at that time. 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 This is also the typical spot for her as it is a very difficult area to compress. Nonetheless previously we have been able to get this under control using her compression socks as well as Xeroform gauze although I am not sure that is can be the best thing for her at this time we discussed that as we proceed. 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 Nonetheless I do  think that we could potentially try some oral steroids, prednisone, to see if we can help calm this down. She is not opposed to this at all. In fact she would like to do anything she can to try to get better she tells me the Achilles area has been very painful at times for her. I really do not see any signs of infection right now but that symptomatic = as well. 10/18; patient with wounds looks a lot better today measuring smaller. She went to her grandsons wedding this weekend did not wear compression wraps. She completed her course of prednisone 50 mg we have been using TCA and Hydrofera Blue on the wound area. 11-11-2021 upon evaluation today patient appears to be doing well currently in regard to her wound in fact she is showing signs of good granulation and epithelization at this point and I am very pleased with where we stand currently. I do not see any signs of infection locally or systemically which is great news. No fevers, chills, nausea, vomiting, or diarrhea. I do believe that the anti-inflammatories did do well for her. 11-18-2021 upon evaluation today patient's wound is actually showing signs of excellent improvement and very pleased with where we were seeing this improvement thus far. I do not see any evidence of active infection locally or systemically which is great news and overall I do believe we are on the right track here. 11/8; patient comes in complaining of marked pain in this area. She takes Tylenol for the discomfort. Unfortunately we are not making much progress in the condition of this wound still slough covered. 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 With that being said there does not appear to be any signs of infection which is good news but the wound does not appear to be quite as healthy as what I saw the last time I saw her in 2 weeks and looking back at pictures I really feel like that there were very small compared to where we started there was a little bit of discoloration of the base of the wound that was not there last week. Some of this was probably just due to bleeding which is understandable from and following debridement but at the same time I do believe that nonetheless she still would benefit from the compression wrapping currently. She is definitely more swollen today than she was previous and she realizes this as well. 12-09-2021 upon evaluation today patient appears to be doing a little better in regard to her wound compared to last time although size wise is not much different it does seem to be improving with regard to the necrotic tissue that is turning more yellow and loosening up which is at least good news. With that being said I do believe that the patient would benefit from likely switching to Santyl this is good to me and we cannot wrap her and she is getting need to actually change this more frequently at home using her compression sock but I think that it may be a better way to go at the moment to see if we can get this cleaned up she is in agreement with that plan. If we get to where we can use collagen  then we will consider going back to the wrap. 12-23-2021 upon evaluation today patient's wound does have some necrotic tissue noted at this point and we are going to need and clean this away today. I discussed that with her. With that being said fortunately there does not appear to be any signs of infection  locally or systemically which is great news and overall I am extremely pleased in that regard. Her husband is changing this daily and that does seem to be benefiting her at this point. The wound is a bit smaller today compared to previous evaluation. 01-06-2022 upon evaluation today patient unfortunately appears to be doing worse in regard to her wound. Actually got a call from her yesterday in [place] because she told me that her foot and ankle area was doing much worse with increased pain. Fortunately I do not see any signs of infection systemically though locally I do's definitely see signs of cellulitis. I discussed that with her today. Nonetheless I do believe that she would benefit from continuation of the antibiotics which I placed her on yesterday. This includes the Bactrim DS although we may need to add something different depend on what the results of the culture come back showing. We are going to do a PCR culture today. Subsequently I am also going to go ahead and give the patient gentamicin cream which I am going to send into the pharmacy for her at this point. 01/13/2022: The culture that was taken last week returned positive for Pseudomonas, which certainly would not be covered by the Bactrim that was prescribed. She is having more pain in the wound and continues to have blue-green drainage on her dressing. There is substantial slough within the wound. 01-20-2022 upon evaluation today patient appears to be doing well currently in regard to the her wound. This looks much better than last time I saw her. Fortunately she did get on the right antibiotic had to be switched to Levaquin actually came back positive that her culture showed Pseudomonas only. Subsequently since being on the Levaquin and the wound has gotten much better she still has some burning but is not nearly as bad as what it was previous. 01-27-2022 upon evaluation today patient appears to be doing pretty well in regard to her  wound compared to where things were previous. Fortunately I do not see any evidence of infection locally nor systemically which is great news and overall I do believe that we are headed in the right direction. Overall the patient's wound seems to be greatly improved. 02-03-2022 upon evaluation today patient's wound is actually showing signs of improvement which is great news. Fortunately I do not see any evidence of worsening overall which is excellent and I do think she is headed in the appropriate direction. She is still having unfortunately discomfort but again I think this is definitely headed in the proper direction. 02-10-2022 upon evaluation today patient's wound is actually showing signs of being smaller and measuring better. Fortunately I do not see any evidence of THEORA, DEPRIMO T ([id number]) [id number] Page 3 of 8 infection and I was able to get the necrotic surface of the wound without any additional sharp debridement today. She actually tolerated this with minimal discomfort compared to what she was previously noted. Overall I am actually very pleased with where we stand currently. 02-17-2022 upon evaluation today patient actually appears to be making some progress here in regard to her leg although this is still not as small as we like to  see it is not as deep as it has been either. Fortunately I do not see any evidence of active infection locally nor systemically which is great news. No fevers, chills, nausea, vomiting, or diarrhea. 03-03-2022 upon evaluation today patient appears to be doing well currently in regard to her wounds. She has been tolerating the dressing changes without complication and fortunately there does not appear to be any signs of active infection locally nor systemically at this point. No fevers, chills, nausea, vomiting, or diarrhea. I think she is definitely ready for Apligraf application today. 03-10-2022 upon evaluation today patient  appears to be doing well currently in regard to her wound. The Apligraf does seem to be of benefit for her and the wound is measuring smaller which is great news. Fortunately I do not see any signs of active infection locally nor systemically which is great news. No fevers, chills, nausea, vomiting, or diarrhea. Electronic Signature(s) Signed: 03/10/2022 4:47:21 PM By: [name] PA-Kim Entered By: [name] on 03/10/2022 16:47:21 -------------------------------------------------------------------------------- Physical Exam Details Patient Name: Date of Service: SHALYA, GRUBER 03/10/2022 9:30 A M Medical Record Number: [id number] Patient Account Number: [id number] Date of Birth/Sex: Treating RN: May 17, 1940 (82 y.o. F) Primary Care Provider: [name] Other Clinician: Referring Provider: Treating Provider/Extender: [name] Weeks in Treatment: 25 Constitutional Well-nourished and well-hydrated in no acute distress. Respiratory normal breathing without difficulty. Psychiatric this patient is able to make decisions and demonstrates good insight into disease process. Alert and Oriented x 3. pleasant and cooperative. Notes Patient's wound really did not require any significant debridement today which was good news. I did however reapply the Apligraf and we used some hydrogel over top to keep it from drying out. She actually I think is doing quite well with this and hopeful that she will see even more improvement over the next week. Electronic Signature(s) Signed: 03/10/2022 4:47:41 PM By: [name] PA-Kim Entered By: [name] on 03/10/2022 16:47:41 -------------------------------------------------------------------------------- Physician Orders Details Patient Name: Date of Service: [name], Bellamarie T. 03/10/2022 9:30 A M Medical Record Number: [id number] Patient Account Number: [id number] Date of Birth/Sex: Treating  RN: [birth year] (82 y.o. 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 - w/ Jeri Cos and [facility] Rm # 9 Wednesday 03/17/22 @ 9:30 (already has appt.) Return A Other: - We will decide whether or not to apply Apligraf bi-weekly or weekly next week on 03/17/22 Anesthetic (In clinic) Topical Lidocaine 5% applied to wound bed Alomar, Shanzay T ([id number]) [id number] Page 4 of 8 Cellular or Tissue Based Products Cellular or Tissue Based Product Type: - Apligraf- RUN IVR 02/10/22 approved 20% coinsurance per application. will order and plan to apply apligraf 03/03/2022 0930. Apligraf # 1 applied 03/03/22 Apligraf # 2 applied 03/10/22 Bathing/ Shower/ Hygiene May shower with protection but do not get wound dressing(s) wet. Protect dressing(s) with water repellant cover (for example, large plastic bag) or a cast cover and may then take shower. Edema Control - Lymphedema / SCD / Other Elevate legs to the level of the heart or above for 30 minutes daily  and/or when sitting for 3-4 times a day throughout the day. Avoid standing for long periods of time. Exercise regularly Wound Treatment Wound #1 - Malleolus Wound Laterality: Right, Medial Cleanser: Normal Saline (Generic) 1 x Per Week/30 Days Discharge Instructions: Cleanse the wound with Normal Saline prior to applying a clean dressing using gauze sponges, not tissue or cotton balls. Cleanser: Soap and Water 1 x Per Week/30 Days Discharge Instructions: May  shower and wash wound with dial antibacterial soap and water prior to dressing change. Peri-Wound Care: Sween Lotion (Moisturizing lotion) 1 x Per Week/30 Days Discharge Instructions: Apply moisturizing lotion as directed Prim Dressing: Apligraf # 2 1 x Per Week/30 Days ary Discharge Instructions: PA applied hydrogel first then skin sub Prim Dressing: adaptic and steri strips ary 1 x Per Week/30 Days Secondary Dressing: ABD Pad, 8x10 1 x Per Week/30 Days Discharge Instructions: Apply over primary dressing as directed. Compression Wrap: ThreePress (3 layer compression wrap) 1 x Per Week/30 Days Discharge Instructions: Apply three layer compression as directed. Electronic Signature(s) Signed: 03/10/2022 5:24:38 PM By: [name] PA-Kim Signed: 03/15/2022 3:50:45 PM By: Rhae Hammock RN Entered By: Rhae Hammock on 03/10/2022 10:17:32 -------------------------------------------------------------------------------- Problem List Details Patient Name: Date of Service: [name], Hadyn T. 03/10/2022 9:30 A M Medical Record Number: [id number] Patient Account Number: [id number] Date of Birth/Sex: Treating RN: [birth year] (82 y.o. 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 03/10/2022 9:30 A M Medical Record Number: [id number] Patient Account Number: [id number] Date of Birth/Sex: Treating RN: 1940/04/16 (82 y.o. F) Primary Care Provider: [name] Other Clinician: Referring Provider: Treating Provider/Extender: [name] Weeks in Treatment: 20 Subjective Chief Complaint Information obtained from Patient Right ankle ulcer History of Present Illness (HPI) 10-21-2021 upon evaluation today patient appears to be doing poorly in regard to the wound over the medial aspect of her right ankle. This is an area that previously she has had trouble with previously. I have actually seen her in [place] about a year ago but this was on the left ankle at that time. 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 This is also the typical spot for her as it is a very difficult area to compress. Nonetheless previously we have been able to get this under control using her compression socks as well as Xeroform gauze although I am not sure that is can be the best thing for her at this time we discussed that as we proceed. 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 10-28-2021 upon evaluation today patient appears to be doing a little worse in regard to having a few other areas popping up on the inside of her ankle right leg where it does appear that the livedoid  vasculitis seems to be flaring up. Nonetheless I do think that we could potentially try some oral steroids, prednisone, to see if we can help calm this down. She is not opposed to this at all. In fact she would like to do anything she can to try to get better she tells me the Achilles area has been very  painful at times for her. I really do not see any signs of infection right now but that symptomatic = as well. 10/18; patient with wounds looks a lot better today measuring smaller. She went to her grandsons wedding this weekend did not wear compression wraps. She completed her course of prednisone 50 mg we have been using TCA and Hydrofera Blue on the wound area. 11-11-2021 upon evaluation today patient appears to be doing well currently in regard to her wound in fact she is showing signs of good granulation and epithelization at this point and I am very pleased with where we stand currently. I do not see any signs of infection locally or systemically which is great news. No fevers, chills, nausea, vomiting, or diarrhea. I do believe that the anti-inflammatories did do well for her. 11-18-2021 upon evaluation today patient's wound is actually showing signs of excellent improvement and very pleased with where we were seeing this improvement thus far. I do not see any evidence of active infection locally or systemically which is great news and overall I do believe we are on the right track here. 11/8; patient comes in complaining of marked pain in this area. She takes Tylenol for the discomfort. Unfortunately we are not making much progress in the condition of this wound still slough covered. 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 With that being said there does not appear to be any signs of infection which is good news but the wound does not appear to be quite as healthy as what I saw the last time I saw her in 2 weeks and looking back at pictures I really feel like that there were very small compared to  where we started there was a little bit of discoloration of the base of the wound that was not there last week. Some of this was probably just due to bleeding which is understandable from and following debridement but at the same time I do believe that nonetheless she still would benefit from the compression wrapping currently. She is definitely more swollen today than she was previous and she realizes this as well. 12-09-2021 upon evaluation today patient appears to be doing a little better in regard to her wound compared to last time although size wise is not much different it does seem to be improving with regard to the necrotic tissue that is turning more yellow and loosening up which is at least good news. With that being said I do believe that the patient would benefit from likely switching to Santyl this is good to me and we cannot wrap her and she is getting need to actually change this more frequently at home using her compression sock but I think that it may be a better way to go at the moment to see if we can get this cleaned up she is in agreement with that  plan. If we get to where we can use collagen then we will consider going back to the wrap. 12-23-2021 upon evaluation today patient's wound does have some necrotic tissue noted at this point and we are going to need and clean this away today. I discussed that with her. With that being said fortunately there does not appear to be any signs of infection locally or systemically which is great news and overall I am extremely pleased in that regard. Her husband is changing this daily and that does seem to be benefiting her at this point. The wound is a bit smaller today compared to previous evaluation. 01-06-2022 upon evaluation today patient unfortunately appears to be doing worse in regard to her wound. Actually got a call from her yesterday in [place] because she told me that her foot and ankle area was doing much worse with increased  pain. Fortunately I do not see any signs of infection systemically though locally I do's definitely see signs of cellulitis. I discussed that with her today. Nonetheless I do believe that she would benefit from continuation of the antibiotics which I placed her on yesterday. This includes the Bactrim DS although we may need to add something different depend on what the results of the culture come back showing. We are going to do a PCR culture today. Subsequently I am also going to go ahead and give the patient gentamicin cream which I am going to send into the pharmacy for her at this point. 01/13/2022: The culture that was taken last week returned positive for Pseudomonas, which certainly would not be covered by the Bactrim that was prescribed. She is having more pain in the wound and continues to have blue-green drainage on her dressing. There is substantial slough within the wound. COZY, EADS ([id number]) [id number] Page 6 of 8 01-20-2022 upon evaluation today patient appears to be doing well currently in regard to the her wound. This looks much better than last time I saw her. Fortunately she did get on the right antibiotic had to be switched to Levaquin actually came back positive that her culture showed Pseudomonas only. Subsequently since being on the Levaquin and the wound has gotten much better she still has some burning but is not nearly as bad as what it was previous. 01-27-2022 upon evaluation today patient appears to be doing pretty well in regard to her wound compared to where things were previous. Fortunately I do not see any evidence of infection locally nor systemically which is great news and overall I do believe that we are headed in the right direction. Overall the patient's wound seems to be greatly improved. 02-03-2022 upon evaluation today patient's wound is actually showing signs of improvement which is great news. Fortunately I do not see any  evidence of worsening overall which is excellent and I do think she is headed in the appropriate direction. She is still having unfortunately discomfort but again I think this is definitely headed in the proper direction. 02-10-2022 upon evaluation today patient's wound is actually showing signs of being smaller and measuring better. Fortunately I do not see any evidence of infection and I was able to get the necrotic surface of the wound without any additional sharp debridement today. She actually tolerated this with minimal discomfort compared to what she was previously noted. Overall I am actually very pleased with where we stand currently. 02-17-2022 upon evaluation today patient actually appears to be making some progress here in regard to her leg although  this is still not as small as we like to see it is not as deep as it has been either. Fortunately I do not see any evidence of active infection locally nor systemically which is great news. No fevers, chills, nausea, vomiting, or diarrhea. 03-03-2022 upon evaluation today patient appears to be doing well currently in regard to her wounds. She has been tolerating the dressing changes without complication and fortunately there does not appear to be any signs of active infection locally nor systemically at this point. No fevers, chills, nausea, vomiting, or diarrhea. I think she is definitely ready for Apligraf application today. 03-10-2022 upon evaluation today patient appears to be doing well currently in regard to her wound. The Apligraf does seem to be of benefit for her and the wound is measuring smaller which is great news. Fortunately I do not see any signs of active infection locally nor systemically which is great news. No fevers, chills, nausea, vomiting, or diarrhea. Objective Constitutional Well-nourished and well-hydrated in no acute distress. Vitals Time Taken: 9:46 AM, Temperature: 98 F, Pulse: 73 bpm, Respiratory Rate: 18  breaths/min, Blood Pressure: 156/88 mmHg. Respiratory normal breathing without difficulty. Psychiatric this patient is able to make decisions and demonstrates good insight into disease process. Alert and Oriented x 3. pleasant and cooperative. General Notes: Patient's wound really did not require any significant debridement today which was good news. I did however reapply the Apligraf and we used some hydrogel over top to keep it from drying out. She actually I think is doing quite well with this and hopeful that she will see even more improvement over the next week. Integumentary (Hair, Skin) Wound #1 status is Open. Original cause of wound was Gradually Appeared. The date acquired was: 09/30/2021. The wound has been in treatment 20 weeks. The wound is located on the Right,Medial Malleolus. The wound measures 0.8cm length x 0.5cm width x 0.3cm depth; 0.314cm^2 area and 0.094cm^3 volume. There is Fat Layer (Subcutaneous Tissue) exposed. There is no tunneling or undermining noted. There is a medium amount of purulent drainage noted. The wound margin is distinct with the outline attached to the wound base. There is large (67-100%) granulation within the wound bed. There is a small (1-33%) amount of necrotic tissue within the wound bed including Adherent Slough. The periwound skin appearance did not exhibit: Callus, Crepitus, Excoriation, Induration, Rash, Scarring, Dry/Scaly, Maceration, Atrophie Blanche, Cyanosis, Ecchymosis, Hemosiderin Staining, Mottled, Pallor, Rubor, Erythema. Periwound temperature was noted as No Abnormality. The periwound has tenderness on palpation. 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 A skin graft procedure using a bioengineered skin substitute/cellular or tissue based product was performed by [name], PA with the following instrument(s): Forceps and Scissors. Apligraf was applied and secured with Steri-Strips. 6 sq cm of product was utilized and 38 sq cm was wasted due to wound size. Post Application, adaptic. gauze was applied. A Time Out was conducted at 10:17, prior to the start of the procedure. The procedure was tolerated well with a pain level of 0 throughout and a pain level of 0 Dargis, Keven T ([id number]) [phone].pdf Page 7 of 8 following the procedure. Post procedure Diagnosis Wound #1: Same as Pre-Procedure . Pre-procedure diagnosis of Wound #1 is a Venous Leg Ulcer located  on the Right,Medial Malleolus . There was a Three Layer Compression Therapy Procedure by Rhae Hammock, RN. Post procedure Diagnosis Wound #1: Same as Pre-Procedure Plan Follow-up Appointments: Return Appointment in 1 week. - w/ Jeri Cos and Lauran Rm # 9 Wednesday 03/17/22 @ 9:30 (already has appt.) Other: - We will decide whether or not to apply Apligraf bi-weekly or weekly next week on 03/17/22 Anesthetic: (In clinic) Topical Lidocaine 5% applied to wound bed Cellular or Tissue Based Products: Cellular or Tissue Based Product Type: - Apligraf- RUN IVR 02/10/22 approved 20% coinsurance per application. will order and plan to apply apligraf 03/03/2022 0930. Apligraf # 1 applied 03/03/22 Apligraf # 2 applied 03/10/22 Bathing/ Shower/ Hygiene: May shower with protection but do not get wound dressing(s) wet. Protect dressing(s) with water repellant cover (for example, large plastic bag) or a cast cover and may then take shower. Edema Control - Lymphedema / SCD / Other: Elevate legs to the level of the heart or above for 30 minutes daily and/or when sitting for 3-4 times a day throughout the day. Avoid standing for long periods of  time. Exercise regularly WOUND #1: - Malleolus Wound Laterality: Right, Medial Cleanser: Normal Saline (Generic) 1 x Per Week/30 Days Discharge Instructions: Cleanse the wound with Normal Saline prior to applying a clean dressing using gauze sponges, not tissue or cotton balls. Cleanser: Soap and Water 1 x Per Week/30 Days Discharge Instructions: May shower and wash wound with dial antibacterial soap and water prior to dressing change. Peri-Wound Care: Sween Lotion (Moisturizing lotion) 1 x Per Week/30 Days Discharge Instructions: Apply moisturizing lotion as directed Prim Dressing: Apligraf # 2 1 x Per Week/30 Days ary Discharge Instructions: PA applied hydrogel first then skin sub Prim Dressing: adaptic and steri strips 1 x Per Week/30 Days ary Secondary Dressing: ABD Pad, 8x10 1 x Per Week/30 Days Discharge Instructions: Apply over primary dressing as directed. Com pression Wrap: ThreePress (3 layer compression wrap) 1 x Per Week/30 Days Discharge Instructions: Apply three layer compression as directed. 1. I would suggest currently that we have the patient continue to monitor for any signs of infection or worsening overall I think she is doing well the Apligraf was getting very dry so I do not think we will leave it for 2 weeks as that could actually hindrance instead of a good thing. This was application #2 today. Working to see where things stand next week and if she is doing well with the change in application using the hydrogel then we can always extend this out for 2 weeks. We will see patient back for reevaluation in 1 week here in the clinic. If anything worsens or changes patient will contact our office for additional recommendations. Electronic Signature(s) Signed: 03/10/2022 4:48:13 PM By: [name] PA-Kim Entered By: [name] on 03/10/2022 16:48:13 -------------------------------------------------------------------------------- SuperBill Details Patient Name: Date  of Service: Kim Lance T. 03/10/2022 Medical Record Number: [id number] Patient Account Number: [id number] Date of Birth/Sex: Treating RN: 09/02/1940 (82 y.o. [name], [name] Primary Care Provider: [name] Other Clinician: Referring Provider: Treating Provider/Extender: [name] Weeks in Treatment: 20 Diagnosis Coding ICD-10 Codes Code Description [phone] 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 I87.331 Chronic venous hypertension (idiopathic) with ulcer and inflammation of right lower extremity Facility Procedures TASSIA, MEDER ([id number]): CPT4 Code Description [id number] (Facility Use Only) Apligraf 1 SQ CM [id number] Page 8 of 8: Modifier Quantity 44  ABIEL, LINKO ([id number]): [id number] - SKIN SUB GRAFT TRNK/ARM/LEG ICD-10 Diagnosis Description [id number] Non-pressure chronic ulcer of right ankle with fat layer expos [phone].pdf Page 8 of 8: 1 ed Physician Procedures : CPT4 Code Description Modifier [id number] - WC PHYS SKIN SUB GRAFT TRNK/ARM/LEG ICD-10 Diagnosis Description [id number] Non-pressure chronic ulcer of right ankle with fat layer exposed Quantity: 1 Electronic Signature(s) Signed: 03/10/2022 4:48:25 PM By: [name] PA-Kim Entered By: [name] on 03/10/2022 16:48:25

## 2022-03-10 NOTE — Progress Notes (Signed)
ENVY, LUST ([id number]) [id number] Page 1 of 5 Visit Report for 03/03/2022 Arrival Information Details Patient Name: Date of Service: [name], [name] 03/03/2022 9:30 A M Medical Record Number: [id number] Patient Account Number: [id number] Date of Birth/Sex: Treating RN: [birth year] (82 y.o. F) Primary Care Jisel Fleet: [name] Other Clinician: Referring Jozalyn Baglio: Treating Kutler Vanvranken/Extender: [name] Weeks in Treatment: 109 Visit Information History Since Last Visit Added or deleted any medications: No Patient Arrived: Ambulatory Any new allergies or adverse reactions: No Arrival Time: 09:22 Had a fall or experienced change in No Accompanied By: husband activities of daily living that may affect Transfer Assistance: None risk of falls: Patient Identification Verified: Yes Signs or symptoms of abuse/neglect since last visito No Secondary Verification Process Completed: Yes Hospitalized since last visit: No Patient Requires Transmission-Based Precautions: No Implantable device outside of the clinic excluding No Patient Has Alerts: No cellular tissue based products placed in the center since last visit: Has Dressing in Place as Prescribed: Yes Has Compression in Place as Prescribed: Yes Pain Present Now: No Electronic Signature(s) Signed: 03/09/2022 4:38:15 PM By: Erenest Blank Entered By: Erenest Blank on 03/03/2022 09:25:05 -------------------------------------------------------------------------------- Lower Extremity Assessment Details Patient Name: Date of Service: [name], [name] 03/03/2022 9:30 A M Medical Record Number: [id number] Patient Account Number: [id number] Date of Birth/Sex: Treating RN: 01-13-1941 (82 y.o. F) Primary Care Jacen Carlini: [name] Other Clinician: Referring Dulce Martian: Treating Rossie Scarfone/Extender: [name] Weeks in Treatment: 19 Edema  Assessment Assessed: [name]: No] [Right: No] Edema: [Left: N] [Right: o] Calf Left: Right: Point of Measurement: 37 cm From Medial Instep 38 cm Ankle Left: Right: Point of Measurement: 8 cm From Medial Instep 21.4 cm Electronic Signature(s) Signed: 03/09/2022 4:38:15 PM By: Erenest Blank Entered By: Erenest Blank on 03/03/2022 09:33:11 Kirks, Vaida T ([id number] Page 2 of 5 -------------------------------------------------------------------------------- Multi-Disciplinary Care Plan Details Patient Name: Date of Service: ZYLPHIA, SERGIO. 03/03/2022 9:30 A M Medical Record Number: [id number] Patient Account Number: [id number] Date of Birth/Sex: Treating RN: 12-09-1940 (82 y.o. F) Primary Care Nykeria Mealing: [name] Other Clinician: Referring Priti Consoli: Treating Mavis Gravelle/Extender: [name] Weeks in Treatment: 19 Active Inactive Wound/Skin Impairment Nursing Diagnoses: Impaired tissue integrity Knowledge deficit related to smoking impact on wound healing Goals: Patient will have a decrease in wound volume by X% from date: (specify in notes) Date Initiated: 10/21/2021 Target Resolution Date: 03/12/2022 Goal Status: Active Patient/caregiver will verbalize understanding of skin care regimen Date Initiated: 10/21/2021 Date Inactivated: 11/11/2021 Target Resolution Date: 11/21/2021 Goal Status: Met Ulcer/skin breakdown will have a volume reduction of 30% by week 4 Date Initiated: 10/21/2021 Date Inactivated: 01/13/2022 Target Resolution Date: 01/16/2022 Unmet Reason: see wound Goal Status: Unmet measurement. Interventions: Assess patient/caregiver ability to obtain necessary supplies Assess patient/caregiver ability to perform ulcer/skin care regimen upon admission and as needed Assess ulceration(s) every visit Notes: Electronic Signature(s) Signed: 03/05/2022 12:08:46 PM By: Rhae Hammock RN Entered  By: Rhae Hammock on 03/05/2022 12:07:19 -------------------------------------------------------------------------------- Pain Assessment Details Patient Name: Date of Service: [name], [name] 03/03/2022 9:30 A M Medical Record Number: [id number] Patient Account Number: [id number] Date of Birth/Sex: Treating RN: 02-19-1940 (82 y.o. F) Primary Care Gentry Pilson: [name] Other Clinician: Referring Ludie Hudon: Treating Shayli Altemose/Extender: [name] Weeks in Treatment: 19 Active Problems Location of Pain Severity and Description of Pain Patient Has Paino No Site Locations Moro, Virginia T ([id number]) [id number]  Page 3 of 5 Pain Management and Medication Current Pain Management: Notes Has shooting pain at times Electronic Signature(s) Signed: 03/05/2022 12:03:25 PM By: Erenest Blank Entered By: Erenest Blank on 03/03/2022 09:25:44 -------------------------------------------------------------------------------- Patient/Caregiver Education Details Patient Name: Date of Service: Denzil Hughes 2/14/2024andnbsp9:30 A M Medical Record Number: [id number] Patient Account Number: [id number] Date of Birth/Gender: Treating RN: 03-Mar-1940 (82 y.o. F) Primary Care Physician: [name] Other Clinician: Referring Physician: Treating Physician/Extender: Karie Schwalbe in Treatment: 19 Education Assessment Education Provided To: Patient Education Topics Provided Wound/Skin Impairment: Methods: Explain/Verbal Responses: Reinforcements needed, State content correctly Electronic Signature(s) Signed: 03/05/2022 12:08:46 PM By: Rhae Hammock RN Entered By: Rhae Hammock on 03/05/2022 12:07:28 -------------------------------------------------------------------------------- Wound Assessment Details Patient Name: Date of Service: [name], [name] 03/03/2022 9:30 A M Medical  Record Number: [id number] Patient Account Number: [id number] ALETHEA, DIANO ([id number]) [phone].pdf Page 4 of 5 Date of Birth/Sex: Treating RN: [birth year] (82 y.o. F) Primary Care Chelsi Warr: Other Clinician: [name] Referring Keniya Schlotterbeck: Treating Molly Savarino/Extender: [name] Weeks in Treatment: 19 Wound Status Wound Number: 1 Primary Etiology: Venous Leg Ulcer Wound Location: Right, Medial Malleolus Wound Status: Open Wounding Event: Gradually Appeared Comorbid History: Peripheral Venous Disease Date Acquired: 09/30/2021 Weeks Of Treatment: 19 Clustered Wound: No Photos Wound Measurements Length: (cm) 0.9 Width: (cm) 0.6 Depth: (cm) 0.2 Area: (cm) 0.424 Volume: (cm) 0.085 % Reduction in Area: 76% % Reduction in Volume: 75.9% Epithelialization: Small (1-33%) Tunneling: No Undermining: No Wound Description Classification: Full Thickness With Exposed Suppor Exudate Amount: Medium Exudate Type: Purulent Exudate Color: yellow, brown, green t Structures Foul Odor After Cleansing: No Slough/Fibrino No Wound Bed Granulation Amount: Small (1-33%) Necrotic Amount: Large (67-100%) Necrotic Quality: Adherent Slough Periwound Skin Texture Texture Color No Abnormalities Noted: No No Abnormalities Noted: No Callus: No Atrophie Blanche: No Crepitus: No Cyanosis: No Excoriation: No Ecchymosis: No Induration: No Erythema: No Rash: No Hemosiderin Staining: No Scarring: No Mottled: No Pallor: No Moisture Rubor: No No Abnormalities Noted: No Dry / Scaly: No Temperature / Pain Maceration: No Temperature: No Abnormality Electronic Signature(s) Signed: 03/05/2022 12:03:25 PM By: Erenest Blank Entered By: Erenest Blank on 03/03/2022 09:39:00 Vitals Details -------------------------------------------------------------------------------- [name] ([id number]) [id number] Page  5 of 5 Patient Name: Date of Service: KEVISHA, SOBON 03/03/2022 9:30 A M Medical Record Number: [id number] Patient Account Number: [id number] Date of Birth/Sex: Treating RN: 1940-11-05 (82 y.o. F) Primary Care Jenniferlynn Saad: [name] Other Clinician: Referring Welma Mccombs: Treating Gaylon Melchor/Extender: [name] Weeks in Treatment: 19 Vital Signs Time Taken: 09:25 Temperature (F): 97.9 Pulse (bpm): 68 Respiratory Rate (breaths/min): 18 Blood Pressure (mmHg): 148/81 Reference Range: 80 - 120 mg / dl Electronic Signature(s) Signed: 03/05/2022 12:03:25 PM By: Erenest Blank Entered By: Erenest Blank on 03/03/2022 09:25:27

## 2022-03-16 NOTE — Progress Notes (Signed)
[name], [name] ([id number]) [id number] Page 1 of 5 Visit Report for 03/10/2022 Arrival Information Details Patient Name: Date of Service: [name], [name] 03/10/2022 9:30 A M Medical Record Number: [id number] Patient Account Number: [id number] Date of Birth/Sex: Treating RN: 06-19-1940 (82 y.o. F) Primary Care Janiesha Diehl: [name] Other Clinician: Referring Ceci Taliaferro: Treating Malikah Lakey/Extender: [name] Weeks in Treatment: 20 Visit Information History Since Last Visit Added or deleted any medications: No Patient Arrived: Ambulatory Any new allergies or adverse reactions: No Arrival Time: 09:45 Had a fall or experienced change in No Accompanied By: husband activities of daily living that may affect Transfer Assistance: None risk of falls: Patient Identification Verified: Yes Signs or symptoms of abuse/neglect since last visito No Secondary Verification Process Completed: Yes Hospitalized since last visit: No Patient Requires Transmission-Based Precautions: No Implantable device outside of the clinic excluding No Patient Has Alerts: No cellular tissue based products placed in the center since last visit: Has Dressing in Place as Prescribed: Yes Has Compression in Place as Prescribed: Yes Pain Present Now: No Electronic Signature(s) Signed: 03/10/2022 4:31:17 PM By: Erenest Blank Entered By: Erenest Blank on 03/10/2022 09:45:52 -------------------------------------------------------------------------------- Compression Therapy Details Patient Name: Date of Service: [name], [name] 03/10/2022 9:30 A M Medical Record Number: [id number] Patient Account Number: [id number] Date of Birth/Sex: Treating RN: Oct 14, 1940 (82 y.o. [name], [name] Primary Care Jadira Nierman: [name] Other Clinician: Referring Anatole Apollo: Treating Mairi Stagliano/Extender: [name] Weeks in Treatment:  20 Compression Therapy Performed for Wound Assessment: Wound #1 Right,Medial Malleolus Performed By: Clinician Rhae Hammock, RN Compression Type: Three Layer Post Procedure Diagnosis Same as Pre-procedure Electronic Signature(s) Signed: 03/15/2022 3:50:45 PM By: Rhae Hammock RN Entered By: Rhae Hammock on 03/10/2022 10:29:10 -------------------------------------------------------------------------------- Encounter Discharge Information Details Patient Name: Date of Service: [name], [name] 03/10/2022 9:30 A M Medical Record Number: [id number] Patient Account Number: [id number] Date of Birth/Sex: Treating RN: 04-06-1940 (82 y.o. [name], [name] Primary Care Sadonna Kotara: [name] Other Clinician: Referring Sarely Stracener: Treating Brodrick Curran/Extender: [name] Weeks in Treatment: 20 Encounter Discharge Information Items Post Procedure Vitals Discharge Condition: Stable Temperature (F): 98.7 Ambulatory Status: Ambulatory Pulse (bpm): 74 Discharge Destination: Home Respiratory Rate (breaths/min): 17 Transportation: Private Auto Blood Pressure (mmHg): 120/80 [name], [name] ([id number]) [id number] Page 2 of 5 Accompanied By: self Schedule Follow-up Appointment: Yes Clinical Summary of Care: Patient Declined Electronic Signature(s) Signed: 03/15/2022 3:50:45 PM By: Rhae Hammock RN Entered By: Rhae Hammock on 03/10/2022 10:31:08 -------------------------------------------------------------------------------- Lower Extremity Assessment Details Patient Name: Date of Service: [name], [name] 03/10/2022 9:30 A M Medical Record Number: [id number] Patient Account Number: [id number] Date of Birth/Sex: Treating RN: 04-May-1940 (82 y.o. F) Primary Care Demari Kropp: [name] Other Clinician: Referring Daymion Nazaire: Treating Zeynab Klett/Extender: [name] Weeks in Treatment:  20 Edema Assessment Assessed: [Left: No] [Right: No] Edema: [Left: N] [Right: o] Calf Left: Right: Point of Measurement: 37 cm From Medial Instep 38.5 cm Ankle Left: Right: Point of Measurement: 8 cm From Medial Instep 22.5 cm Electronic Signature(s) Signed: 03/10/2022 4:31:17 PM By: Erenest Blank Entered By: Erenest Blank on 03/10/2022 09:56:31 -------------------------------------------------------------------------------- Multi-Disciplinary Care Plan Details Patient Name: Date of Service: [name], [name] 03/10/2022 9:30 A M Medical Record Number: [id number] Patient Account Number: [id number] Date of Birth/Sex: Treating RN: Aug 05, 1940 (82 y.o. [name], [name] Primary Care Kaleem Sartwell: [name] Other Clinician: Referring Juliany Daughety: Treating  Tereka Thorley/Extender: [name] Weeks in Treatment: 20 Active Inactive Wound/Skin Impairment Nursing Diagnoses: Impaired tissue integrity Knowledge deficit related to smoking impact on wound healing Goals: Patient will have a decrease in wound volume by X% from date: (specify in notes) Date Initiated: 10/21/2021 Target Resolution Date: 03/20/2022 Goal Status: Active Patient/caregiver will verbalize understanding of skin care regimen Date Initiated: 10/21/2021 Date Inactivated: 11/11/2021 Target Resolution Date: 11/21/2021 Goal Status: Met Ulcer/skin breakdown will have a volume reduction of 30% by week 4 Date Initiated: 10/21/2021 Date Inactivated: 01/13/2022 Target Resolution Date: 01/16/2022 Unmet Reason: see wound Goal Status: Unmet measurement. Interventions: [name], [name] ([id number]) [id number] Page 3 of 5 Assess patient/caregiver ability to obtain necessary supplies Assess patient/caregiver ability to perform ulcer/skin care regimen upon admission and as needed Assess ulceration(s) every visit Notes: Electronic Signature(s) Signed: 03/15/2022 3:50:45 PM By:  Rhae Hammock RN Entered By: Rhae Hammock on 03/10/2022 10:06:52 -------------------------------------------------------------------------------- Pain Assessment Details Patient Name: Date of Service: [name], [name] 03/10/2022 9:30 A M Medical Record Number: [id number] Patient Account Number: [id number] Date of Birth/Sex: Treating RN: 09-27-1940 (82 y.o. F) Primary Care Guenther Dunshee: [name] Other Clinician: Referring Hartman Minahan: Treating Briggette Najarian/Extender: [name] Weeks in Treatment: 20 Active Problems Location of Pain Severity and Description of Pain Patient Has Paino No Site Locations Pain Management and Medication Current Pain Management: Notes has pain at times, stinging Electronic Signature(s) Signed: 03/10/2022 4:31:17 PM By: Erenest Blank Entered By: Erenest Blank on 03/10/2022 09:46:43 -------------------------------------------------------------------------------- Patient/Caregiver Education Details Patient Name: Date of Service: [name] 2/21/2024andnbsp9:30 A M Medical Record Number: [id number] Patient Account Number: [id number] Date of Birth/Gender: Treating RN: [birth year] (82 y.o. [name], [name] Primary Care Physician: [name] Other Clinician: Referring Physician: Treating Physician/Extender: Karie Schwalbe in Treatment: 20 Education Assessment Education Provided To: Patient [name], [name] ([id number]) [id number] Page 4 of 5 Education Topics Provided Wound/Skin Impairment: Methods: Explain/Verbal Responses: State content correctly Electronic Signature(s) Signed: 03/15/2022 3:50:45 PM By: Rhae Hammock RN Entered By: Rhae Hammock on 03/10/2022 10:08:21 -------------------------------------------------------------------------------- Wound Assessment Details Patient Name: Date of Service: [name], [name] 03/10/2022  9:30 A M Medical Record Number: [id number] Patient Account Number: [id number] Date of Birth/Sex: Treating RN: 1940-12-23 (82 y.o. [name], [name] Primary Care Tamyrah Burbage: [name] Other Clinician: Referring Raffaela Ladley: Treating Jonica Bickhart/Extender: [name] Weeks in Treatment: 20 Wound Status Wound Number: 1 Primary Etiology: Venous Leg Ulcer Wound Location: Right, Medial Malleolus Wound Status: Open Wounding Event: Gradually Appeared Comorbid History: Peripheral Venous Disease Date Acquired: 09/30/2021 Weeks Of Treatment: 20 Clustered Wound: No Photos Wound Measurements Length: (cm) 0.8 Width: (cm) 0.5 Depth: (cm) 0.3 Area: (cm) 0.314 Volume: (cm) 0.094 % Reduction in Area: 82.2% % Reduction in Volume: 73.4% Epithelialization: Medium (34-66%) Tunneling: No Undermining: No Wound Description Classification: Full Thickness With Exposed Suppor Wound Margin: Distinct, outline attached Exudate Amount: Medium Exudate Type: Purulent Exudate Color: yellow, brown, green t Structures Foul Odor After Cleansing: No Slough/Fibrino No Wound Bed Granulation Amount: Large (67-100%) Exposed Structure Necrotic Amount: Small (1-33%) Fascia Exposed: No Necrotic Quality: Adherent Slough Fat Layer (Subcutaneous Tissue) Exposed: Yes Tendon Exposed: No Muscle Exposed: No Joint Exposed: No Bone Exposed: No Periwound Skin Texture Texture Color No Abnormalities Noted: No No Abnormalities Noted: No Callus: No Atrophie Blanche: No Crepitus: No Cyanosis: No Bhattacharyya, Lucina T ([id number]) [id number] Page 5 of 5 Excoriation: No Ecchymosis: No Induration:  No Erythema: No Rash: No Hemosiderin Staining: No Scarring: No Mottled: No Pallor: No Moisture Rubor: No No Abnormalities Noted: No Dry / Scaly: No Temperature / Pain Maceration: No Temperature: No Abnormality Tenderness on Palpation: Yes Treatment Notes Wound #1  (Malleolus) Wound Laterality: Right, Medial Cleanser Normal Saline Discharge Instruction: Cleanse the wound with Normal Saline prior to applying a clean dressing using gauze sponges, not tissue or cotton balls. Soap and Water Discharge Instruction: May shower and wash wound with dial antibacterial soap and water prior to dressing change. Peri-Wound Care Sween Lotion (Moisturizing lotion) Discharge Instruction: Apply moisturizing lotion as directed Topical Primary Dressing Apligraf # 2 Discharge Instruction: PA applied hydrogel first then skin sub adaptic and steri strips Secondary Dressing ABD Pad, 8x10 Discharge Instruction: Apply over primary dressing as directed. Secured With Compression Wrap ThreePress (3 layer compression wrap) Discharge Instruction: Apply three layer compression as directed. Compression Stockings Add-Ons Electronic Signature(s) Signed: 03/15/2022 3:50:45 PM By: Rhae Hammock RN Entered By: Rhae Hammock on 03/10/2022 10:12:42 -------------------------------------------------------------------------------- Vitals Details Patient Name: Date of Service: [name], [name] 03/10/2022 9:30 A M Medical Record Number: [id number] Patient Account Number: [id number] Date of Birth/Sex: Treating RN: March 12, 1940 (82 y.o. F) Primary Care Aamina Skiff: [name] Other Clinician: Referring Sofia Vanmeter: Treating Heru Montz/Extender: [name] Weeks in Treatment: 20 Vital Signs Time Taken: 09:46 Temperature (F): 98 Pulse (bpm): 73 Respiratory Rate (breaths/min): 18 Blood Pressure (mmHg): 156/88 Reference Range: 80 - 120 mg / dl Electronic Signature(s) Signed: 03/10/2022 4:31:17 PM By: Erenest Blank Entered By: Erenest Blank on 03/10/2022 09:46:23

## 2022-03-17 ENCOUNTER — Encounter (HOSPITAL_BASED_OUTPATIENT_CLINIC_OR_DEPARTMENT_OTHER): Payer: Medicare Other | Admitting: Physician Assistant

## 2022-03-17 DIAGNOSIS — I87311 Chronic venous hypertension (idiopathic) with ulcer of right lower extremity: Secondary | ICD-10-CM | POA: Diagnosis not present

## 2022-03-17 DIAGNOSIS — L97312 Non-pressure chronic ulcer of right ankle with fat layer exposed: Secondary | ICD-10-CM | POA: Diagnosis not present

## 2022-03-17 DIAGNOSIS — I776 Arteritis, unspecified: Secondary | ICD-10-CM | POA: Diagnosis not present

## 2022-03-17 DIAGNOSIS — I872 Venous insufficiency (chronic) (peripheral): Secondary | ICD-10-CM | POA: Diagnosis not present

## 2022-03-18 DIAGNOSIS — C44319 Basal cell carcinoma of skin of other parts of face: Secondary | ICD-10-CM | POA: Diagnosis not present

## 2022-03-18 DIAGNOSIS — D2262 Melanocytic nevi of left upper limb, including shoulder: Secondary | ICD-10-CM | POA: Diagnosis not present

## 2022-03-18 DIAGNOSIS — D2261 Melanocytic nevi of right upper limb, including shoulder: Secondary | ICD-10-CM | POA: Diagnosis not present

## 2022-03-18 DIAGNOSIS — Z85828 Personal history of other malignant neoplasm of skin: Secondary | ICD-10-CM | POA: Diagnosis not present

## 2022-03-18 DIAGNOSIS — L821 Other seborrheic keratosis: Secondary | ICD-10-CM | POA: Diagnosis not present

## 2022-03-18 DIAGNOSIS — D485 Neoplasm of uncertain behavior of skin: Secondary | ICD-10-CM | POA: Diagnosis not present

## 2022-03-18 NOTE — Progress Notes (Signed)
[name] ([id number]) [id number] Page 1 of 5 Visit Report for 03/17/2022 Arrival Information Details Patient Name: Date of Service: [name] 03/17/2022 9:30 A M Medical Record Number: [id number] Patient Account Number: [id number] Date of Birth/Sex: Treating RN: 15-Feb-1940 (82 y.o. F) Primary Care Seydou Hearns: [name] Other Clinician: Referring Tonique Mendonca: Treating Alexsandro Salek/Extender: [name] Weeks in Treatment: 21 Visit Information History Since Last Visit Added or deleted any medications: No Patient Arrived: Ambulatory Any new allergies or adverse reactions: No Arrival Time: 09:35 Had a fall or experienced change in No Accompanied By: husband activities of daily living that may affect Transfer Assistance: None risk of falls: Patient Identification Verified: Yes Signs or symptoms of abuse/neglect since last visito No Secondary Verification Process Completed: Yes Hospitalized since last visit: No Patient Requires Transmission-Based Precautions: No Implantable device outside of the clinic excluding No Patient Has Alerts: No cellular tissue based products placed in the center since last visit: Has Compression in Place as Prescribed: Yes Pain Present Now: No Electronic Signature(s) Signed: 03/17/2022 3:49:00 PM By: Erenest Blank Entered By: Erenest Blank on 03/17/2022 09:36:13 -------------------------------------------------------------------------------- Encounter Discharge Information Details Patient Name: Date of Service: [name], [name] 03/17/2022 9:30 A M Medical Record Number: [id number] Patient Account Number: [id number] Date of Birth/Sex: Treating RN: 01-06-1941 (82 y.o. [name], [name] Primary Care Carman Auxier: [name] Other Clinician: Referring Newell Wafer: Treating Lakaisha Danish/Extender: [name] Weeks in Treatment: 21 Encounter Discharge  Information Items Post Procedure Vitals Discharge Condition: Stable Temperature (F): 98.7 Ambulatory Status: Ambulatory Pulse (bpm): 74 Discharge Destination: Home Respiratory Rate (breaths/min): 17 Transportation: Private Auto Blood Pressure (mmHg): 133/82 Accompanied By: husband Schedule Follow-up Appointment: Yes Clinical Summary of Care: Patient Declined Electronic Signature(s) Signed: 03/17/2022 3:59:14 PM By: Rhae Hammock RN Entered By: Rhae Hammock on 03/17/2022 10:34:50 -------------------------------------------------------------------------------- Lower Extremity Assessment Details Patient Name: Date of Service: [name], [name] 03/17/2022 9:30 A M Medical Record Number: [id number] Patient Account Number: [id number] Date of Birth/Sex: Treating RN: 01-Oct-1940 (82 y.o. F) Primary Care Twylah Bennetts: [name] Other Clinician: Referring Samary Shatz: Treating Jaidin Richison/Extender: [name] Weeks in Treatment: 21 Edema Assessment Assessed: [name]: No] Patrice Paradise: No] C[LeftXITLALIC, GLASCOCK T ([id number] [[id number] Page 2 of 5] Edema: [Left: N] [Right: o] Calf Left: Right: Point of Measurement: 37 cm From Medial Instep 39.3 cm Ankle Left: Right: Point of Measurement: 8 cm From Medial Instep 21.3 cm Electronic Signature(s) Signed: 03/17/2022 3:49:00 PM By: Erenest Blank Entered By: Erenest Blank on 03/17/2022 09:48:06 -------------------------------------------------------------------------------- Multi-Disciplinary Care Plan Details Patient Name: Date of Service: [name], [name] 03/17/2022 9:30 A M Medical Record Number: [id number] Patient Account Number: [id number] Date of Birth/Sex: Treating RN: 1940/03/03 (82 y.o. [name], [name] Primary Care Blanche Gallien: [name] Other Clinician: Referring Johm Pfannenstiel: Treating Murrell Elizondo/Extender: [name] Weeks in  Treatment: 21 Active Inactive Wound/Skin Impairment Nursing Diagnoses: Impaired tissue integrity Knowledge deficit related to smoking impact on wound healing Goals: Patient will have a decrease in wound volume by X% from date: (specify in notes) Date Initiated: 10/21/2021 Target Resolution Date: 03/20/2022 Goal Status: Active Patient/caregiver will verbalize understanding of skin care regimen Date Initiated: 10/21/2021 Date Inactivated: 11/11/2021 Target Resolution Date: 11/21/2021 Goal Status: Met Ulcer/skin breakdown will have a volume reduction of 30% by week 4 Date Initiated: 10/21/2021 Date Inactivated: 01/13/2022 Target Resolution Date: 01/16/2022 Unmet Reason: see wound Goal Status: Unmet measurement. Interventions:  Assess patient/caregiver ability to obtain necessary supplies Assess patient/caregiver ability to perform ulcer/skin care regimen upon admission and as needed Assess ulceration(s) every visit Notes: Electronic Signature(s) Signed: 03/17/2022 3:59:14 PM By: Rhae Hammock RN Entered By: Rhae Hammock on 03/17/2022 10:15:54 -------------------------------------------------------------------------------- Pain Assessment Details Patient Name: Date of Service: [name], [name] 03/17/2022 9:30 A M Medical Record Number: [id number] Patient Account Number: [id number] Date of Birth/Sex: Treating RN: [birth year] (82 y.o. F) Primary Care Judithann Villamar: [name] Other Clinician: Referring Landi Biscardi: Treating Kylee Nardozzi/Extender: [name] Weeks in Treatment: [address] [name], Kim T ([id number]) [id number] Page 3 of 5 Location of Pain Severity and Description of Pain Patient Has Paino No Site Locations Pain Management and Medication Current Pain Management: Electronic Signature(s) Signed: 03/17/2022 3:49:00 PM By: Erenest Blank Entered By: Erenest Blank on 03/17/2022  09:36:52 -------------------------------------------------------------------------------- Patient/Caregiver Education Details Patient Name: Date of Service: [name] 2/28/2024andnbsp9:30 A M Medical Record Number: [id number] Patient Account Number: [id number] Date of Birth/Gender: Treating RN: 1941-01-01 (82 y.o. [name], [name] Primary Care Physician: [name] Other Clinician: Referring Physician: Treating Physician/Extender: Karie Schwalbe in Treatment: 21 Education Assessment Education Provided To: Patient Education Topics Provided Wound/Skin Impairment: Methods: Explain/Verbal Responses: Reinforcements needed, State content correctly Electronic Signature(s) Signed: 03/17/2022 3:59:14 PM By: Rhae Hammock RN Entered By: Rhae Hammock on 03/17/2022 10:16:49 -------------------------------------------------------------------------------- Wound Assessment Details Patient Name: Date of Service: [name], [name] 03/17/2022 9:30 A M Medical Record Number: [id number] Patient Account Number: [id number] Date of Birth/Sex: Treating RN: [birth year] (82 y.o. F) Primary Care Floyd Lusignan: [name] Other Clinician: Referring Kasee Hantz: Treating Jamari Moten/Extender: [name] Weeks in Treatment: 21 Wound Status Wound Number: 1 Primary Etiology: Venous Leg Ulcer Wound Location: Right, Medial Malleolus Wound Status: Open [name], Kim T ([id number]) [id number] Page 4 of 5 Wounding Event: Gradually Appeared Comorbid History: Peripheral Venous Disease Date Acquired: 09/30/2021 Weeks Of Treatment: 21 Clustered Wound: No Photos Wound Measurements Length: (cm) 1 Width: (cm) 0.7 Depth: (cm) 0.1 Area: (cm) 0.55 Volume: (cm) 0.055 % Reduction in Area: 68.9% % Reduction in Volume: 84.4% Epithelialization: Medium (34-66%) Tunneling: No Undermining: No Wound  Description Classification: Full Thickness With Exposed Support Structures Wound Margin: Distinct, outline attached Exudate Amount: Medium Exudate Type: Purulent Exudate Color: yellow, brown, green Foul Odor After Cleansing: No Slough/Fibrino No Wound Bed Granulation Amount: None Present (0%) Exposed Structure Necrotic Amount: Large (67-100%) Fascia Exposed: No Necrotic Quality: Adherent Slough Fat Layer (Subcutaneous Tissue) Exposed: Yes Tendon Exposed: No Muscle Exposed: No Joint Exposed: No Bone Exposed: No Periwound Skin Texture Texture Color No Abnormalities Noted: No No Abnormalities Noted: No Callus: No Atrophie Blanche: No Crepitus: No Cyanosis: No Excoriation: No Ecchymosis: No Induration: No Erythema: No Rash: No Hemosiderin Staining: No Scarring: No Mottled: No Pallor: No Moisture Rubor: No No Abnormalities Noted: No Dry / Scaly: No Temperature / Pain Maceration: No Temperature: No Abnormality Tenderness on Palpation: Yes Treatment Notes Wound #1 (Malleolus) Wound Laterality: Right, Medial Cleanser Normal Saline Discharge Instruction: Cleanse the wound with Normal Saline prior to applying a clean dressing using gauze sponges, not tissue or cotton balls. Soap and Water Discharge Instruction: May shower and wash wound with dial antibacterial soap and water prior to dressing change. Peri-Wound Care Sween Lotion (Moisturizing lotion) Discharge Instruction: Apply moisturizing lotion as directed Topical [name], Kim T ([id number]) [id number] Page 5 of 5 Primary Dressing Xeroform Occlusive Gauze Dressing, 4x4 in  Discharge Instruction: Apply hydrogel and xeroform to wound bed as instructed Apligraf # 3 Discharge Instruction: PA applied hydrogel first then skin sub adaptic and steri strips Secondary Dressing ABD Pad, 8x10 Discharge Instruction: Apply over primary dressing as directed. Secured With Compression  Wrap ThreePress (3 layer compression wrap) Discharge Instruction: Apply three layer compression as directed. Compression Stockings Add-Ons Electronic Signature(s) Signed: 03/17/2022 3:49:00 PM By: Erenest Blank Entered By: Erenest Blank on 03/17/2022 09:49:48 -------------------------------------------------------------------------------- Vitals Details Patient Name: Date of Service: [name], [name] 03/17/2022 9:30 A M Medical Record Number: [id number] Patient Account Number: [id number] Date of Birth/Sex: Treating RN: 07-22-1940 (82 y.o. F) Primary Care Dayquan Buys: [name] Other Clinician: Referring Arrianna Catala: Treating Bee Hammerschmidt/Extender: [name] Weeks in Treatment: 21 Vital Signs Time Taken: 09:36 Temperature (F): 97.9 Pulse (bpm): 74 Respiratory Rate (breaths/min): 18 Blood Pressure (mmHg): 149/72 Reference Range: 80 - 120 mg / dl Electronic Signature(s) Signed: 03/17/2022 3:49:00 PM By: Erenest Blank Entered By: Erenest Blank on 03/17/2022 09:36:41

## 2022-03-18 NOTE — Progress Notes (Addendum)
JAN, BRIST ([id number]) [id number] Page 1 of 9 Visit Report for 03/17/2022 Chief Complaint Document Details Patient Name: Date of Service: [name], [name] 03/17/2022 9:30 A M Medical Record Number: [id number] Patient Account Number: [id number] Date of Birth/Sex: Treating RN: 1940-09-13 (82 y.o. F) Primary Care Provider: Leonides [name] Other Clinician: Referring Provider: Treating Provider/Extender: [name] Weeks in Treatment: 21 Information Obtained from: Patient Chief Complaint Right ankle ulcer Electronic Signature(s) Signed: 03/17/2022 9:59:45 AM By: [name] PA-C Entered By: [name] on 03/17/2022 09:59:45 -------------------------------------------------------------------------------- Cellular or Tissue Based Product Details Patient Name: Date of Service: [name], [name] 03/17/2022 9:30 A M Medical Record Number: [id number] Patient Account Number: [id number] Date of Birth/Sex: Treating RN: 1940/07/18 (82 y.o. [name], [name] Primary Care Provider: Leonides [name] Other Clinician: Referring Provider: Treating Provider/Extender: [name] Weeks in Treatment: 21 Cellular or Tissue Based Product Type Wound #1 Right,Medial Malleolus Applied to: Performed By: Physician [name], PA Cellular or Tissue Based Product Type: Apligraf Level of Consciousness (Pre-procedure): Awake and Alert Pre-procedure Verification/Time Out Yes - 10:30 Taken: Location: trunk / arms / legs Wound Size (sq cm): 0.7 Product Size (sq cm): 44 Waste Size (sq cm): 40 Waste Reason: wound size Amount of Product Applied (sq cm): 4 Instrument Used: Forceps, Scissors Lot #: [id number] Expiration Date: 03/19/2022 Fenestrated: Yes Instrument: Blade Reconstituted: Yes Solution Type: NS Solution Amount: 10 mL Lot #: [id number] Solution Expiration Date: 06/17/2024 Secured:  Yes Secured With: Steri-Strips Dressing Applied: Yes Primary Dressing: adaptic Procedural Pain: 0 Post Procedural Pain: 0 Response to Treatment: Procedure was tolerated well Level of Consciousness (Post- Awake and Alert procedure): Post Procedure Diagnosis Same as Pre-procedure Electronic Signature(s) Signed: 03/17/2022 3:56:54 PM By: [place], [place] T ([id number]) [id number] Page 2 of 9 Signed: 03/17/2022 3:59:14 PM By: Rhae Hammock RN Entered By: Rhae Hammock on 03/17/2022 10:32:38 -------------------------------------------------------------------------------- Debridement Details Patient Name: Date of Service: [name], [name] 03/17/2022 9:30 A M Medical Record Number: [id number] Patient Account Number: [id number] Date of Birth/Sex: Treating RN: 01/12/1941 (82 y.o. [name], [name] Primary Care Provider: Leonides [name] Other Clinician: Referring Provider: Treating Provider/Extender: [name] Weeks in Treatment: 21 Debridement Performed for Assessment: Wound #1 Right,Medial Malleolus Performed By: Physician [name], PA Debridement Type: Debridement Severity of Tissue Pre Debridement: Fat layer exposed Level of Consciousness (Pre-procedure): Awake and Alert Pre-procedure Verification/Time Out Yes - 10:18 Taken: Start Time: 10:18 Pain Control: Lidocaine T Area Debrided (L x W): otal 1 (cm) x 0.7 (cm) = 0.7 (cm) Tissue and other material debrided: Viable, Non-Viable, Slough, Subcutaneous, Skin: Dermis , Skin: Epidermis, Slough Level: Skin/Subcutaneous Tissue Debridement Description: Excisional Instrument: Curette Bleeding: Minimum Hemostasis Achieved: Pressure End Time: 10:18 Procedural Pain: 0 Post Procedural Pain: 0 Response to Treatment: Procedure was tolerated well Level of Consciousness (Post- Awake and Alert procedure): Post Debridement Measurements of Total  Wound Length: (cm) 1 Width: (cm) 0.7 Depth: (cm) 0.1 Volume: (cm) 0.055 Character of Wound/Ulcer Post Debridement: Improved Severity of Tissue Post Debridement: Fat layer exposed Post Procedure Diagnosis Same as Pre-procedure Electronic Signature(s) Signed: 03/17/2022 3:56:54 PM By: [name] PA-C Signed: 03/17/2022 3:59:14 PM By: Rhae Hammock RN Entered By: Rhae Hammock on 03/17/2022 10:19:29 -------------------------------------------------------------------------------- HPI Details Patient Name: Date of Service: [name], [name] 03/17/2022 9:30 A M Medical Record Number: [id number] Patient Account Number: [id number] Date  of Birth/Sex: Treating RN: March 03, 1940 (82 y.o. F) Primary Care Provider: Leonides [name] Other Clinician: Referring Provider: Treating Provider/Extender: [name] Weeks in Treatment: 21 History of Present Illness HPI Description: 10-21-2021 upon evaluation today patient appears to be doing poorly in regard to the wound over the medial aspect of her right ankle. This is an area that previously she has had trouble with previously. I have actually seen her in [place] about a year ago but this was on the left ankle at that time. 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 This is also the typical spot for her as it is a very difficult area to compress. Nonetheless previously we have been able to get this under control using her compression socks as well as Xeroform gauze although I am not sure that is can be the best thing for her at this time we discussed that as we proceed. 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 Nonetheless I do think that we could potentially try some oral steroids, prednisone, to see if we can help calm this down. She is not opposed to this at all. In fact she would like to do anything she can to try to get better she tells me the Achilles area has been very painful at times for her. I really do not see any signs of infection right now but that symptomatic = as well. 10/18; patient with wounds looks a lot better today measuring smaller. She went to her grandsons wedding this weekend did not wear compression wraps. She completed her course of prednisone 50 mg we have been using TCA and Hydrofera Blue on the wound area. 11-11-2021 upon evaluation today patient appears to be doing well currently in regard to her wound in fact she is showing signs of good granulation and epithelization at this point and I am very pleased with where we stand currently. I do not see any signs of infection locally or systemically which is great news. No fevers, chills, nausea, vomiting, or diarrhea. I do believe that the anti-inflammatories did do well for her. 11-18-2021 upon evaluation today patient's wound is actually showing signs of excellent improvement and very pleased with where we were seeing this improvement thus far. I do not see any evidence of active infection locally or systemically which is great news and overall I do believe we are on the right track here. 11/8; patient comes in complaining of marked pain in this area. She takes Tylenol for the discomfort. Unfortunately we are not making much progress in the condition of this wound still slough covered. 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 With that being said there does not appear to be any signs of infection which is good news but the wound does not appear  to be quite as healthy as what I saw the last time I saw her in 2 weeks and looking back at pictures I really feel like that there were very small compared to where we started there was a little bit of discoloration of the base of the wound that was not there last week. Some of this was probably just due to bleeding which is understandable from and following debridement but at the same time I do believe that nonetheless she still would benefit from the compression wrapping currently. She is definitely more swollen today than she was previous and she realizes this as well. 12-09-2021 upon evaluation today patient appears to be doing a little better in regard to her wound compared to last time although size wise is not much different it does seem to be improving with regard to the necrotic tissue that is turning more yellow and loosening up which is at least good news. With that being said I do believe that the patient would benefit from likely switching to Santyl this is good to me and we cannot wrap her and she is getting need to actually change this more frequently at home using her compression sock but I think that it may be a better way to go at the moment to see if we can get this cleaned up she is in agreement with that plan. If we get to where we can use collagen then we will consider going back to the wrap. 12-23-2021 upon evaluation today patient's wound does have some necrotic tissue noted at this point and we are going to need and clean this away today. I discussed that with her. With that being said fortunately there does not appear to be any signs of infection locally or systemically which is great news and overall I am extremely pleased in  that regard. Her husband is changing this daily and that does seem to be benefiting her at this point. The wound is a bit smaller today compared to previous evaluation. 01-06-2022 upon evaluation today patient unfortunately appears to be doing worse in regard to her wound. Actually got a call from her yesterday in [place] because she told me that her foot and ankle area was doing much worse with increased pain. Fortunately I do not see any signs of infection systemically though locally I do's definitely see signs of cellulitis. I discussed that with her today. Nonetheless I do believe that she would benefit from continuation of the antibiotics which I placed her on yesterday. This includes the Bactrim DS although we may need to add something different depend on what the results of the culture come back showing. We are going to do a PCR culture today. Subsequently I am also going to go ahead and give the patient gentamicin cream which I am going to send into the pharmacy for her at this point. 01/13/2022: The culture that was taken last week returned positive for Pseudomonas, which certainly would not be covered by the Bactrim that was prescribed. She is having more pain in the wound and continues to have blue-green drainage on her dressing. There is substantial slough within the wound. 01-20-2022 upon evaluation today patient appears to be doing well currently in regard to the her wound. This looks much better than last time I saw her. Fortunately she did get on the right antibiotic had to be switched to Levaquin actually came back positive that her culture showed Pseudomonas only. Subsequently since being on the Levaquin and the wound  has gotten much better she still has some burning but is not nearly as bad as what it was previous. 01-27-2022 upon evaluation today patient appears to be doing pretty well in regard to her wound compared to where things were previous. Fortunately I do not see any  evidence of infection locally nor systemically which is great news and overall I do believe that we are headed in the right direction. Overall the patient's wound seems to be greatly improved. 02-03-2022 upon evaluation today patient's wound is actually showing signs of improvement which is great news. Fortunately I do not see any evidence of worsening overall which is excellent and I do think she is headed in the appropriate direction. She is still having unfortunately discomfort but again I think this is definitely headed in the proper direction. 02-10-2022 upon evaluation today patient's wound is actually showing signs of being smaller and measuring better. Fortunately I do not see any evidence of infection and I was able to get the necrotic surface of the wound without any additional sharp debridement today. She actually tolerated this with minimal discomfort compared to what she was previously noted. Overall I am actually very pleased with where we stand currently. 02-17-2022 upon evaluation today patient actually appears to be making some progress here in regard to her leg although this is still not as small as we like to see it is not as deep as it has been either. Fortunately I do not see any evidence of active infection locally nor systemically which is great news. No fevers, chills, nausea, vomiting, or diarrhea. 03-03-2022 upon evaluation today patient appears to be doing well currently in regard to her wounds. She has been tolerating the dressing changes without complication and fortunately there does not appear to be any signs of active infection locally nor systemically at this point. No fevers, chills, nausea, vomiting, or diarrhea. I think she is definitely ready for Apligraf application today. 03-10-2022 upon evaluation today patient appears to be doing well currently in regard to her wound. The Apligraf does seem to be of benefit for her and the wound is measuring smaller which is great  news. Fortunately I do not see any signs of active infection locally nor systemically which is great news. No fevers, chills, nausea, vomiting, or diarrhea. 03-17-2022 upon evaluation today patient appears to be doing well currently in regard to her wound. She has been tolerating the dressing changes without complication. Fortunately I do not see any signs of active infection locally nor systemically which is great news. No fevers, chills, nausea, vomiting, or diarrhea. Electronic Signature(s) Signed: 03/17/2022 2:46:04 PM By: [name] PA-C Entered By: [name] on 03/17/2022 14:46:04 Reep, Tobi T ([id number] Page 4 of 9 -------------------------------------------------------------------------------- Physical Exam Details Patient Name: Date of Service: [name], [name] 03/17/2022 9:30 A M Medical Record Number: [id number] Patient Account Number: [id number] Date of Birth/Sex: Treating RN: 12-02-1940 (82 y.o. F) Primary Care Provider: Leonides [name] Other Clinician: Referring Provider: Treating Provider/Extender: [name] Weeks in Treatment: 21 Constitutional Well-nourished and well-hydrated in no acute distress. Respiratory normal breathing without difficulty. Psychiatric this patient is able to make decisions and demonstrates good insight into disease process. Alert and Oriented x 3. pleasant and cooperative. Notes Upon inspection patient's wound did require some sharp debridement to clear away some of the left over residual Apligraf she tolerated that today without complication and postdebridement wound bed is significantly improved which is great news.  No fevers, chills, nausea, vomiting, or diarrhea. With that being said I do believe that we need to find some way to keep this from drying out as much in between dressing changes I discussed that with the patient today as well. Electronic  Signature(s) Signed: 03/17/2022 2:50:08 PM By: [name] PA-C Entered By: [name] on 03/17/2022 14:50:08 -------------------------------------------------------------------------------- Physician Orders Details Patient Name: Date of Service: [name], [name] 03/17/2022 9:30 A M Medical Record Number: [id number] Patient Account Number: [id number] Date of Birth/Sex: Treating RN: 12/06/1940 (82 y.o. 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 - w/ Jeri Cos (Dr. Dellia Nims covering) and bobbi Rm # 8 Wednesday 03/24/22 @ 11:00 Return A ppointment in 2 weeks. - w/ Jeri Cos (Dr. Dellia Nims covering) and Allayne Butcher Rm # 4 Wednesday 03/31/22 @ 11:00 Return A Other: - Apligraf weekly for now 03/17/22.*If dressing doesn't dry out, can go 2 weeks for Apligraf* Anesthetic (In clinic) Topical Lidocaine 5% applied to wound bed Cellular or Tissue Based Products Cellular or Tissue Based Product Type: - Apligraf- RUN IVR 02/10/22 approved 20% coinsurance per application. will order and plan to apply apligraf 03/03/2022 0930. Apligraf # 1 applied 03/03/22 Apligraf # 2 applied 03/10/22 Apligraf # 3 applied 03/17/22 Bathing/ Shower/ Hygiene May shower with protection but do not get wound dressing(s) wet. Protect dressing(s) with water repellant cover (for example, large plastic bag) or a cast cover and may then take shower. Edema Control - Lymphedema / SCD / Other Elevate legs to the level of the heart or above for 30 minutes daily  and/or when sitting for 3-4 times a day throughout the day. Avoid standing for long periods of time. Exercise regularly NALY, IRESON T ([id number]) [id number] Page 5 of 9 Wound Treatment Wound #1 - Malleolus Wound Laterality: Right, Medial Cleanser: Normal Saline (Generic) 1 x Per Week/30 Days Discharge Instructions: Cleanse the wound with Normal Saline prior to applying a clean dressing using gauze sponges, not tissue or cotton balls. Cleanser: Soap and Water 1 x Per Week/30 Days Discharge Instructions: May shower and wash wound with dial antibacterial soap and water prior to dressing change. Peri-Wound Care: Sween Lotion (Moisturizing lotion) 1 x Per Week/30 Days Discharge Instructions: Apply moisturizing lotion as directed Prim Dressing: Xeroform Occlusive Gauze Dressing, 4x4 in 1 x Per Week/30 Days ary Discharge Instructions: Apply hydrogel and xeroform to wound bed as instructed Prim Dressing: Apligraf # 3 1 x Per Week/30 Days ary Discharge Instructions: PA applied hydrogel first then skin sub Prim Dressing: adaptic and steri strips ary 1 x Per Week/30 Days Secondary Dressing: ABD Pad, 8x10 1 x Per Week/30 Days Discharge Instructions: Apply over primary dressing as directed. Compression Wrap: ThreePress (3 layer compression wrap) 1 x Per Week/30 Days Discharge Instructions: Apply three layer compression as directed. Electronic Signature(s) Signed: 03/17/2022 3:56:54 PM By: [name] PA-C Signed: 03/17/2022 3:59:14 PM By: Rhae Hammock RN Entered By: Rhae Hammock on 03/17/2022 10:28:39 -------------------------------------------------------------------------------- Problem List Details Patient Name: Date of Service: [name], Delonda T. 03/17/2022 9:30 A M Medical Record Number: [id number] Patient Account Number: [id number] Date of Birth/Sex: Treating RN:  [birth year] (82 y.o. 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 10/28/2021 No Yes I87.331 Chronic venous hypertension (idiopathic) with ulcer and inflammation of right 10/21/2021 No Yes lower extremity Inactive Problems Resolved Problems Electronic Signature(s) Signed: 03/17/2022 9:59:38 AM By: [name] PA-C Entered By: [name] on 03/17/2022 09:59:38 Clausen, Shiah T ([id number] Page 6 of 9 -------------------------------------------------------------------------------- Progress Note Details Patient Name: Date of Service: KENDRY, [name] 03/17/2022 9:30 A M Medical Record Number: [id number] Patient Account Number: [id number] Date of Birth/Sex: Treating RN: 1940/06/07 (82 y.o. F) Primary Care Provider: Leonides [name] Other Clinician: Referring Provider: Treating Provider/Extender: [name] Weeks in Treatment: 21 Subjective Chief Complaint Information obtained from Patient Right ankle ulcer History of Present Illness (HPI) 10-21-2021 upon evaluation today patient appears to be doing poorly in regard to the wound over the medial aspect of her right ankle. This is an area that previously she has had trouble with previously. I have actually seen her in [place] about a year ago but this was on the left ankle at that time. 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 This is also the typical spot for her as it is a very difficult area to compress. Nonetheless previously we have been able to get this under control using her compression socks as well as Xeroform gauze although I am not sure that is  can be the best thing for her at this time we discussed that as we proceed. 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 Nonetheless I do think that we could potentially try some oral steroids, prednisone, to see if we can help calm this down. She is not opposed to this at all. In fact she would like to do anything she can to try to get better she tells me the Achilles area has been very painful at times for her. I really do not see any signs of infection right now but that symptomatic = as well. 10/18; patient with wounds looks a lot better today measuring smaller. She went to her grandsons wedding this weekend did not wear compression wraps. She completed her course of prednisone 50 mg we have been using TCA and Hydrofera Blue on the wound area. 11-11-2021 upon evaluation today patient appears to be doing well currently in regard to her wound in fact she is showing signs of good granulation and epithelization at this point and I am very pleased with where we stand currently. I do not see any signs of infection locally or systemically which is great news. No fevers, chills, nausea, vomiting, or diarrhea. I do believe that the anti-inflammatories did do well for her. 11-18-2021 upon evaluation today patient's wound is actually showing signs of excellent improvement and very pleased with where we were seeing this improvement thus far. I do not see any evidence of active  infection locally or systemically which is great news and overall I do believe we are on the right track here. 11/8; patient comes in complaining of marked pain in this area. She takes Tylenol for the discomfort. Unfortunately we are not  making much progress in the condition of this wound still slough covered. 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 With that being said there does not appear to be any signs of infection which is good news but the wound does not appear to be quite as healthy as what I saw the last time I saw her in 2 weeks and looking back at pictures I really feel like that there were very small compared to where we started there was a little bit of discoloration of the base of the wound that was not there last week. Some of this was probably just due to bleeding which is understandable from and following debridement but at the same time I do believe that nonetheless she still would benefit from the compression wrapping currently. She is definitely more swollen today than she was previous and she realizes this as well. 12-09-2021 upon evaluation today patient appears to be doing a little better in regard to her wound compared to last time although size wise is not much different it does seem to be improving with regard to the necrotic tissue that is turning more yellow and loosening up which is at least good news. With that being said I do believe that the patient would benefit from likely switching to Santyl this is good to me and we cannot wrap her and she is getting need to actually change this more frequently at home using her compression sock but I think that it may be a better way to go at the moment to see if we can get this cleaned up she is in agreement with that plan. If we get to where we can use collagen then we will consider going back to the wrap. 12-23-2021 upon evaluation today patient's wound does have some  necrotic tissue noted at this point and we are going to need and clean this away today. I discussed that with her. With that being said fortunately there does not appear to be any signs of infection locally or systemically which is great news and overall I am extremely pleased in that regard. Her husband is changing this daily and that does seem to be benefiting her at this point. The wound is a bit smaller today compared to previous evaluation. 01-06-2022 upon evaluation today patient unfortunately appears to be doing worse in regard to her wound. Actually got a call from her yesterday in [place] because she told me that her foot and ankle area was doing much worse with increased pain. Fortunately I do not see any signs of infection systemically though locally I do's definitely see signs of cellulitis. I discussed that with her today. Nonetheless I do believe that she would benefit from continuation of the antibiotics which I placed her on yesterday. This includes the Bactrim DS although we may need to add something different depend on what the results of the culture come back showing. We are going to do a PCR culture today. Subsequently I am also  going to go ahead and give the patient gentamicin cream which I am going to send into the pharmacy for her at this point. 01/13/2022: The culture that was taken last week returned positive for Pseudomonas, which certainly would not be covered by the Bactrim that was prescribed. She is having more pain in the wound and continues to have blue-green drainage on her dressing. There is substantial slough within the wound. 01-20-2022 upon evaluation today patient appears to be doing well currently in regard to the her wound. This looks much better than last time I saw her. Fortunately she did get on the right antibiotic had to be switched to Levaquin actually came back positive that her culture showed Pseudomonas only. Subsequently since being on the Levaquin and  the wound has gotten much better she still has some burning but is not nearly as bad as what it was previous. 01-27-2022 upon evaluation today patient appears to be doing pretty well in regard to her wound compared to where things were previous. Fortunately I do not see any evidence of infection locally nor systemically which is great news and overall I do believe that we are headed in the right direction. Overall the patient's wound seems to be greatly improved. 02-03-2022 upon evaluation today patient's wound is actually showing signs of improvement which is great news. Fortunately I do not see any evidence of worsening overall which is excellent and I do think she is headed in the appropriate direction. She is still having unfortunately discomfort but again I think this is definitely headed in the proper direction. 02-10-2022 upon evaluation today patient's wound is actually showing signs of being smaller and measuring better. Fortunately I do not see any evidence of infection and I was able to get the necrotic surface of the wound without any additional sharp debridement today. She actually tolerated this with minimal discomfort compared to what she was previously noted. Overall I am actually very pleased with where we stand currently. [name], [name] ([id number]) [id number] Page 7 of 9 02-17-2022 upon evaluation today patient actually appears to be making some progress here in regard to her leg although this is still not as small as we like to see it is not as deep as it has been either. Fortunately I do not see any evidence of active infection locally nor systemically which is great news. No fevers, chills, nausea, vomiting, or diarrhea. 03-03-2022 upon evaluation today patient appears to be doing well currently in regard to her wounds. She has been tolerating the dressing changes without complication and fortunately there does not appear to be any signs of active  infection locally nor systemically at this point. No fevers, chills, nausea, vomiting, or diarrhea. I think she is definitely ready for Apligraf application today. 03-10-2022 upon evaluation today patient appears to be doing well currently in regard to her wound. The Apligraf does seem to be of benefit for her and the wound is measuring smaller which is great news. Fortunately I do not see any signs of active infection locally nor systemically which is great news. No fevers, chills, nausea, vomiting, or diarrhea. 03-17-2022 upon evaluation today patient appears to be doing well currently in regard to her wound. She has been tolerating the dressing changes without complication. Fortunately I do not see any signs of active infection locally nor systemically which is great news. No fevers, chills, nausea, vomiting, or diarrhea. Objective Constitutional Well-nourished and well-hydrated in no acute distress. Vitals Time Taken: 9:36 AM, Temperature: 97.9 F,  Pulse: 74 bpm, Respiratory Rate: 18 breaths/min, Blood Pressure: 149/72 mmHg. Respiratory normal breathing without difficulty. Psychiatric this patient is able to make decisions and demonstrates good insight into disease process. Alert and Oriented x 3. pleasant and cooperative. General Notes: Upon inspection patient's wound did require some sharp debridement to clear away some of the left over residual Apligraf she tolerated that today without complication and postdebridement wound bed is significantly improved which is great news. No fevers, chills, nausea, vomiting, or diarrhea. With that being said I do believe that we need to find some way to keep this from drying out as much in between dressing changes I discussed that with the patient today as well. Integumentary (Hair, Skin) Wound #1 status is Open. Original cause of wound was Gradually Appeared. The date acquired was: 09/30/2021. The wound has been in treatment 21 weeks. The wound is  located on the Right,Medial Malleolus. The wound measures 1cm length x 0.7cm width x 0.1cm depth; 0.55cm^2 area and 0.055cm^3 volume. There is Fat Layer (Subcutaneous Tissue) exposed. There is no tunneling or undermining noted. There is a medium amount of purulent drainage noted. The wound margin is distinct with the outline attached to the wound base. There is no granulation within the wound bed. There is a large (67-100%) amount of necrotic tissue within the wound bed including Adherent Slough. The periwound skin appearance did not exhibit: Callus, Crepitus, Excoriation, Induration, Rash, Scarring, Dry/Scaly, Maceration, Atrophie Blanche, Cyanosis, Ecchymosis, Hemosiderin Staining, Mottled, Pallor, Rubor, Erythema. Periwound temperature was noted as No Abnormality. The periwound has tenderness on palpation. 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 There was a Excisional Skin/Subcutaneous Tissue Debridement with a total area of 0.7 sq cm performed by [name], PA. With the following instrument(s): Curette to remove Viable and Non-Viable tissue/material. Material removed includes Subcutaneous Tissue, Slough, Skin: Dermis, and Skin: Epidermis after achieving pain control using Lidocaine. No specimens were taken. A time out was conducted at 10:18, prior to the start of the procedure. A Minimum amount of bleeding was controlled with Pressure. The procedure was tolerated well with a pain level of 0 throughout and a pain level of 0 following the procedure. Post Debridement Measurements: 1cm length x 0.7cm width x 0.1cm depth; 0.055cm^3 volume. Character  of Wound/Ulcer Post Debridement is improved. Severity of Tissue Post Debridement is: Fat layer exposed. Post procedure Diagnosis Wound #1: Same as Pre-Procedure Pre-procedure diagnosis of Wound #1 is a Venous Leg Ulcer located on the Right,Medial Malleolus. A skin graft procedure using a bioengineered skin substitute/cellular or tissue based product was performed by [name], PA with the following instrument(s): Forceps and Scissors. Apligraf was applied and secured with Steri-Strips. 4 sq cm of product was utilized and 40 sq cm was wasted due to wound size. Post Application, adaptic was applied. A Time Out was conducted at 10:30, prior to the start of the procedure. The procedure was tolerated well with a pain level of 0 throughout and a pain level of 0 following the procedure. LARRAINE, LANGWORTHY ([id number]) [id number] Page 8 of 9 Post procedure Diagnosis Wound #1: Same as Pre-Procedure . Plan Follow-up Appointments: Return Appointment in 1 week. - w/ Jeri Cos (Dr. Dellia Nims covering) and  bobbi Rm # 8 Wednesday 03/24/22 @ 11:00 Return Appointment in 2 weeks. - w/ Jeri Cos (Dr. Dellia Nims covering) and Allayne Butcher Rm # 4 Wednesday 03/31/22 @ 11:00 Other: - Apligraf weekly for now 03/17/22.*If dressing doesn't dry out, can go 2 weeks for Apligraf* Anesthetic: (In clinic) Topical Lidocaine 5% applied to wound bed Cellular or Tissue Based Products: Cellular or Tissue Based Product Type: - Apligraf- RUN IVR 02/10/22 approved 20% coinsurance per application. will order and plan to apply apligraf 03/03/2022 0930. Apligraf # 1 applied 03/03/22 Apligraf # 2 applied 03/10/22 Apligraf # 3 applied 03/17/22 Bathing/ Shower/ Hygiene: May shower with protection but do not get wound dressing(s) wet. Protect dressing(s) with water repellant cover (for example, large plastic bag) or a cast cover and may then take shower. Edema Control - Lymphedema / SCD / Other: Elevate legs to the  level of the heart or above for 30 minutes daily and/or when sitting for 3-4 times a day throughout the day. Avoid standing for long periods of time. Exercise regularly WOUND #1: - Malleolus Wound Laterality: Right, Medial Cleanser: Normal Saline (Generic) 1 x Per Week/30 Days Discharge Instructions: Cleanse the wound with Normal Saline prior to applying a clean dressing using gauze sponges, not tissue or cotton balls. Cleanser: Soap and Water 1 x Per Week/30 Days Discharge Instructions: May shower and wash wound with dial antibacterial soap and water prior to dressing change. Peri-Wound Care: Sween Lotion (Moisturizing lotion) 1 x Per Week/30 Days Discharge Instructions: Apply moisturizing lotion as directed Prim Dressing: Xeroform Occlusive Gauze Dressing, 4x4 in 1 x Per Week/30 Days ary Discharge Instructions: Apply hydrogel and xeroform to wound bed as instructed Prim Dressing: Apligraf # 3 1 x Per Week/30 Days ary Discharge Instructions: PA applied hydrogel first then skin sub Prim Dressing: adaptic and steri strips 1 x Per Week/30 Days ary Secondary Dressing: ABD Pad, 8x10 1 x Per Week/30 Days Discharge Instructions: Apply over primary dressing as directed. Com pression Wrap: ThreePress (3 layer compression wrap) 1 x Per Week/30 Days Discharge Instructions: Apply three layer compression as directed. 1. Based on what I am seeing I would go ahead and recommend that we have the patient continue with the Apligraf I did go ahead and reapply that today as well. Fortunately the patient actually tolerated the dressing changes without complication and post application seems to be doing much better which is great news. I am going to actually put some hydrogel followed by the Xeroform gauze and then subsequently the Adaptic over top of that in order to try to prevent this from drying out. 2. I am also can recommend that the patient should continue with the recommendation for the 3 layer  compression wrap which I think has been beneficial up to this point. I am hopeful that this will continue to show signs of good improvement. 3. Depending on how things go over the next week if the patient is doing well and this does not dry out too much I definitely think that we can continue with the every other week Apligraf changes if it is too dry then we may have to stick with weekly. We will see patient back for reevaluation in 1 week here in the clinic. If anything worsens or changes patient will contact our office for additional recommendations. Electronic Signature(s) Signed: 03/17/2022 2:51:21 PM By: [name] PA-C Entered By: [name] on 03/17/2022 14:51:21 -------------------------------------------------------------------------------- SuperBill Details Patient Name: Date of Service: [name], [name] 03/17/2022 Medical  Record Number: [id number] Patient Account Number: [id number] Date of Birth/Sex: Treating RN: 27-Aug-1940 (82 y.o. [name], [name] Primary Care Provider: Leonides [name] Other Clinician: Referring Provider: Treating Provider/Extender: [name] Weeks in Treatment: 21 Diagnosis Coding ICD-10 Codes Code Description [phone] 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 I87.331 Chronic venous hypertension (idiopathic) with ulcer and inflammation of right lower extremity ROLA, HEMSLEY T ([id number]) [phone].pdf Page 9 of 9 Facility Procedures : CPT4 Code: [id number] Description: (Facility Use Only) Apligraf 1 SQ CM Modifier: Quantity: 44 : CPT4 Code: [id number] Description: [id number] - SKIN SUB GRAFT TRNK/ARM/LEG ICD-10 Diagnosis Description [id number] Non-pressure chronic ulcer of right ankle with fat layer exposed Modifier: Quantity: 1 Physician Procedures : CPT4 Code Description Modifier [id number] - WC PHYS SKIN SUB GRAFT TRNK/ARM/LEG ICD-10  Diagnosis Description [id number] Non-pressure chronic ulcer of right ankle with fat layer exposed Quantity: 1 Electronic Signature(s) Signed: 03/17/2022 2:51:36 PM By: [name] PA-C Entered By: [name] on 03/17/2022 14:51:36

## 2022-03-22 DIAGNOSIS — H353221 Exudative age-related macular degeneration, left eye, with active choroidal neovascularization: Secondary | ICD-10-CM | POA: Diagnosis not present

## 2022-03-23 ENCOUNTER — Telehealth: Payer: Self-pay | Admitting: Family Medicine

## 2022-03-23 NOTE — Telephone Encounter (Addendum)
She is having persistent insomnia, I told patient I would check on options other than BZD

## 2022-03-24 ENCOUNTER — Encounter (HOSPITAL_BASED_OUTPATIENT_CLINIC_OR_DEPARTMENT_OTHER): Payer: Medicare Other | Attending: Internal Medicine | Admitting: Internal Medicine

## 2022-03-24 DIAGNOSIS — L950 Livedoid vasculitis: Secondary | ICD-10-CM | POA: Insufficient documentation

## 2022-03-24 DIAGNOSIS — I87331 Chronic venous hypertension (idiopathic) with ulcer and inflammation of right lower extremity: Secondary | ICD-10-CM | POA: Diagnosis not present

## 2022-03-24 DIAGNOSIS — L97312 Non-pressure chronic ulcer of right ankle with fat layer exposed: Secondary | ICD-10-CM | POA: Diagnosis not present

## 2022-03-24 DIAGNOSIS — I872 Venous insufficiency (chronic) (peripheral): Secondary | ICD-10-CM | POA: Diagnosis not present

## 2022-03-24 MED ORDER — BELSOMRA 10 MG PO TABS: 10.0000 mg | ORAL_TABLET | Freq: Every evening | ORAL | Status: DC | PRN

## 2022-03-24 NOTE — Telephone Encounter (Signed)
Please update patient.  The next option for patient may be a medication like Belsomra.  She needs an office visit so we can talk about options.  We may also need to change her Lexapro dose concurrently.

## 2022-03-25 NOTE — Telephone Encounter (Signed)
Called and updated patient. Scheduled appt for 05/10/22 per patient request.

## 2022-03-26 NOTE — Progress Notes (Signed)
[name], [name] ([id number]) [id number] Page 1 of 6 Visit Report for 03/24/2022 HPI Details Patient Name: Date of Service: [name], [name]. 03/24/2022 11:00 A M Medical Record Number: [id number] Patient Account Number: [id number] Date of Birth/Sex: Treating RN: 11-Oct-1940 (82 y.o. F) Primary Care Provider: [name] Other Clinician: Referring Provider: Treating Provider/Extender: Lina Sayre, Purcell Mouton Weeks in Treatment: 22 History of Present Illness HPI Description: 10-21-2021 upon evaluation today patient appears to be doing poorly in regard to the wound over the medial aspect of her right ankle. This is an area that previously she has had trouble with previously. I have actually seen her in [place] about a year ago but this was on the left ankle at that time. 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 This is also the typical spot for her as it is a very difficult area to compress. Nonetheless previously we have been able to get this under control using her compression socks as well as Xeroform gauze although I am not sure that is can be the best thing for her at this time we discussed that as we proceed. 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 Nonetheless I do think that we could potentially try some oral steroids, prednisone, to see if we can help calm this down. She is not opposed to this at all. In fact she would like to do anything she can to try to get better she tells me the Achilles area has been very painful at times for her. I really do not see any signs of  infection right now but that symptomatic = as well. 10/18; patient with wounds looks a lot better today measuring smaller. She went to her grandsons wedding this weekend did not wear compression wraps. She completed her course of prednisone 50 mg we have been using TCA and Hydrofera Blue on the wound area. 11-11-2021 upon evaluation today patient appears to be doing well currently in regard to her wound in fact she is showing signs of good granulation and epithelization at this point and I am very pleased with where we stand currently. I do not see any signs of infection locally or systemically which is great news. No fevers, chills, nausea, vomiting, or diarrhea. I do believe that the anti-inflammatories did do well for her. 11-18-2021 upon evaluation today patient's wound is actually showing signs of excellent improvement and very pleased with where we were seeing this improvement thus far. I do not see any evidence of active infection locally or systemically which is great news and overall I do believe we are on the right track here. 11/8; patient comes in complaining of marked pain in this area. She takes Tylenol for the discomfort. Unfortunately we are not making much progress in the condition of this wound still slough covered. 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 With that being said there does not appear to be any signs of infection which is good news but the wound does not appear to be quite as healthy as what I saw the last time I saw her in 2 weeks and looking back at pictures I really feel like that there were very small compared to where we started there was a little bit of discoloration of  the base of the wound that was not there last Kim. Some of this was probably just due to bleeding which is understandable from and following debridement but at the same time I do believe that nonetheless she still would benefit from the compression wrapping currently. She is definitely more swollen today than she was previous and she realizes this as well. 12-09-2021 upon evaluation today patient appears to be doing a little better in regard to her wound compared to last time although size wise is not much different it does seem to be improving with regard to the necrotic tissue that is turning more yellow and loosening up which is at least good news. With that being said I do believe that the patient would benefit from likely switching to Santyl this is good to me and we cannot wrap her and she is getting need to actually change this more frequently at home using her compression sock but I think that it may be a better way to go at the moment to see if we can get this cleaned up she is in agreement with that plan. If we get to where we can use collagen then we will consider going back to the wrap. 12-23-2021 upon evaluation today patient's wound does have some necrotic tissue noted at this point and we are going to need and clean this away today. I discussed that with her. With that being said fortunately there does not appear to be any signs of infection locally or systemically which is great news and overall I am extremely pleased in that regard. Her husband is changing this daily and that does seem to be benefiting her at this point. The wound is a bit smaller today compared to previous evaluation. 01-06-2022 upon evaluation today patient unfortunately appears to be doing worse in regard to her wound. Actually got a call from her yesterday in [place] because she told me that her foot and ankle area was doing much worse with increased pain. Fortunately I do not see any signs of infection  systemically though locally I do's definitely see signs of cellulitis. I discussed that with her today. Nonetheless I do believe that she would benefit from continuation of the antibiotics which I placed her on yesterday. This includes the Bactrim DS although we may need to add something different depend on what the results of the culture come back showing. We are going to do a PCR culture today. Subsequently I am also going to go ahead and give the patient gentamicin cream which I am going to send into the pharmacy for her at this point. 01/13/2022: The culture that was taken last Kim returned positive for Pseudomonas, which certainly would not be covered by the Bactrim that was prescribed. She is having more pain in the wound and continues to have blue-green drainage on her dressing. There is substantial slough within the wound. 01-20-2022 upon evaluation today patient appears to be doing well currently in regard to the her wound. This looks much better than last time I saw her. Fortunately she did get on  the right antibiotic had to be switched to Levaquin actually came back positive that her culture showed Pseudomonas only. Subsequently since being on the Levaquin and the wound has gotten much better she still has some burning but is not nearly as bad as what it was previous. 01-27-2022 upon evaluation today patient appears to be doing pretty well in regard to her wound compared to where things were previous. Fortunately I do not see any evidence of infection locally nor systemically which is great news and overall I do believe that we are headed in the right direction. Overall the patient's wound seems to be greatly improved. 02-03-2022 upon evaluation today patient's wound is actually showing signs of improvement which is great news. Fortunately I do not see any evidence of worsening overall which is excellent and I do think she is headed in the appropriate direction. She is still having  unfortunately discomfort but again I think this is definitely headed in the proper direction. 02-10-2022 upon evaluation today patient's wound is actually showing signs of being smaller and measuring better. Fortunately I do not see any evidence of infection and I was able to get the necrotic surface of the wound without any additional sharp debridement today. She actually tolerated this with minimal discomfort compared to what she was previously noted. Overall I am actually very pleased with where we stand currently. [name], [name] ([id number]) [id number] Page 2 of 6 02-17-2022 upon evaluation today patient actually appears to be making some progress here in regard to her leg although this is still not as small as we like to see it is not as deep as it has been either. Fortunately I do not see any evidence of active infection locally nor systemically which is great news. No fevers, chills, nausea, vomiting, or diarrhea. 03-03-2022 upon evaluation today patient appears to be doing well currently in regard to her wounds. She has been tolerating the dressing changes without complication and fortunately there does not appear to be any signs of active infection locally nor systemically at this point. No fevers, chills, nausea, vomiting, or diarrhea. I think she is definitely ready for Apligraf application today. 03-10-2022 upon evaluation today patient appears to be doing well currently in regard to her wound. The Apligraf does seem to be of benefit for her and the wound is measuring smaller which is great news. Fortunately I do not see any signs of active infection locally nor systemically which is great news. No fevers, chills, nausea, vomiting, or diarrhea. 03-17-2022 upon evaluation today patient appears to be doing well currently in regard to her wound. She has been tolerating the dressing changes without complication. Fortunately I do not see any signs of active infection  locally nor systemically which is great news. No fevers, chills, nausea, vomiting, or diarrhea. 3/6; we have been treating a wound on the right medial ankle with Apligraf. Upon examination today there is no open wound however the area is very fragile still certainly enough to dress with Xeroform and a wrap. But I do not think he requires Apligraf. 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 03/24/2022 11:00 A M Medical Record Number: [id number] Patient Account Number: [id number] Date of Birth/Sex: Treating RN: October 09, 1940 (82 y.o. F) Primary Care Provider: [name] Other Clinician: Referring Provider: Treating Provider/Extender: Linton Ham  [name] Weeks in Treatment: 75 Constitutional Patient is hypertensive.. Pulse regular and within target range for patient.[name] Respirations regular, non-labored and within target range.. Temperature is normal and within the target range for the patient.[name] Appears in no distress. Notes Wound exam; the area on the right medial ankle is epithelialized but very fragile. There is no evidence of infection. Edema control around this area is good no evidence of infection Electronic Signature(s) Signed: 03/24/2022 4:27:04 PM By: Linton Ham MD Entered By: Linton Ham on 03/24/2022 12:02:05 -------------------------------------------------------------------------------- Physician Orders Details Patient Name: Date of Service: [name], [name]. 03/24/2022 11:00 A M Medical Record Number: [id number] Patient Account Number: [id number] Date of Birth/Sex: Treating RN: [birth year] (82 y.o. 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 - w/ Jeri Cos (Dr. Dellia Nims covering) and Allayne Butcher Rm # 4 Wednesday 03/31/22 @ 11:00 Return A Other: - Bring in compression stockings next Kim. Anesthetic (In clinic) Topical Lidocaine 5% applied to wound bed Auzenne, Xaniyah [name] ([id number]) [phone].pdf Page 3 of 6 Cellular or Tissue Based Products Cellular or Tissue Based Product Type: - Apligraf- RUN IVR 02/10/22 approved 20% coinsurance per application. will order and plan to apply apligraf 03/03/2022 0930. Apligraf # 1 applied 03/03/22 Apligraf # 2 applied 03/10/22 Apligraf # 3 applied 03/17/22 hold apligraf #4- wound closed today. Bathing/ Shower/ Hygiene May shower with protection but do not get wound dressing(s) wet. Protect dressing(s) with water repellant cover (for example, large plastic bag) or a cast cover and may then take shower. Edema Control - Lymphedema / SCD / Other Elevate legs to the level of the heart or above for 30 minutes daily and/or when sitting for 3-4 times a day throughout the day. A void standing for long periods of time. Exercise regularly Other Edema Control Orders/Instructions: - lotion to right leg, xerform to closed area, 3 layer compression to right leg. If compression wraps slide down please call wound center and speak with a nurse. Electronic Signature(s) Signed: 03/24/2022 4:27:04 PM By: Linton Ham MD Signed: 03/24/2022 5:05:26 PM By: Deon Pilling RN, BSN Entered By: Deon Pilling on 03/24/2022  11:47:53 -------------------------------------------------------------------------------- Problem List Details Patient Name: Date of Service: [name], [name]. 03/24/2022 11:00 A M Medical Record Number: [id number] Patient Account Number: [id number] Date of Birth/Sex: Treating RN: 1940/04/22 (82 y.o. 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 03/24/2022 11:00 A M Medical Record Number: [id number] Patient Account Number: [id number] Date of Birth/Sex: Treating RN: [birth year] (82 y.o. F) Primary Care Provider: [name] Other Clinician: Referring Provider: Treating Provider/Extender: Linton Ham  [name], [name], [name] ([id number]) [id number] Page 4 of 6 Weeks in Treatment: 22 Subjective History of Present Illness (HPI) 10-21-2021 upon evaluation today patient appears to be doing poorly in regard to the wound over the medial aspect of her right ankle. This is an area that previously she has had trouble with previously. I have actually seen her in [place] about a year ago but this was on the left ankle at that time. With that being said she does have a  history of vasculitis which is often what causes this to open up. This is also the typical spot for her as it is a very difficult area to compress. Nonetheless previously we have been able to get this under control using her compression socks as well as Xeroform gauze although I am not sure that is can be the best thing for her at this time we discussed that as we proceed. 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 Nonetheless I do think that we could potentially try some oral steroids, prednisone, to see if we can help calm this down. She is not opposed to this at all. In fact she would like to do anything she can to try to get better she tells me the Achilles area has been very painful at times for her. I really do not see any signs of infection right now but that symptomatic = as well. 10/18; patient with wounds looks a lot better today measuring smaller. She went to her grandsons wedding this weekend did not wear compression wraps. She completed her course of prednisone 50 mg we have been using TCA and Hydrofera Blue on the wound area. 11-11-2021 upon evaluation today patient appears to be doing well currently in regard to her wound in fact she is showing signs of good granulation and epithelization at this point and I am very pleased with where we stand currently. I do not see any signs of infection locally or systemically which is great news. No fevers, chills, nausea, vomiting, or diarrhea. I do believe that the anti-inflammatories did do well for her. 11-18-2021 upon evaluation today patient's wound is actually showing signs of excellent improvement and very pleased with where we were seeing  this improvement thus far. I do not see any evidence of active infection locally or systemically which is great news and overall I do believe we are on the right track here. 11/8; patient comes in complaining of marked pain in this area. She takes Tylenol for the discomfort. Unfortunately we are not making much progress in the condition of this wound still slough covered. 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 With that being said there does not appear to be any signs of infection which is good news but the wound does not appear to be quite as healthy as what I saw the last time I saw her in 2 weeks and looking back at pictures  I really feel like that there were very small compared to where we started there was a little bit of discoloration of the base of the wound that was not there last Kim. Some of this was probably just due to bleeding which is understandable from and following debridement but at the same time I do believe that nonetheless she still would benefit from the compression wrapping currently. She is definitely more swollen today than she was previous and she realizes this as well. 12-09-2021 upon evaluation today patient appears to be doing a little better in regard to her wound compared to last time although size wise is not much different it does seem to be improving with regard to the necrotic tissue that is turning more yellow and loosening up which is at least good news. With that being said I do believe that the patient would benefit from likely switching to Santyl this is good to me and we cannot wrap her and she is getting need to actually change this more frequently at home using  her compression sock but I think that it may be a better way to go at the moment to see if we can get this cleaned up she is in agreement with that plan. If we get to where we can use collagen then we will consider going back to the wrap. 12-23-2021 upon evaluation today patient's wound does have some necrotic tissue noted at this point and we are going to need and clean this away today. I discussed that with her. With that being said fortunately there does not appear to be any signs of infection locally or systemically which is great news and overall I am extremely pleased in that regard. Her husband is changing this daily and that does seem to be benefiting her at this point. The wound is a bit smaller today compared to previous evaluation. 01-06-2022 upon evaluation today patient unfortunately appears to be doing worse in regard to her wound. Actually got a call from her yesterday in [place] because she told me that her foot and ankle area was doing much worse with increased pain. Fortunately I do not see any signs of infection systemically though locally I do's definitely see signs of cellulitis. I discussed that with her today. Nonetheless I do believe that she would benefit from continuation of the antibiotics which I placed her on yesterday. This includes the Bactrim DS although we may need to add something different depend on what the results of the culture come back showing. We are going to do a PCR culture today. Subsequently I am also going to go ahead and give the patient gentamicin cream which I am going to send into the pharmacy for her at this point. 01/13/2022: The culture that was taken last Kim returned positive for Pseudomonas, which certainly would not be covered by the Bactrim that was prescribed. She is having more pain in the wound and continues to have blue-green drainage on her dressing. There is substantial slough within the wound. 01-20-2022 upon evaluation today patient  appears to be doing well currently in regard to the her wound. This looks much better than last time I saw her. Fortunately she did get on the right antibiotic had to be switched to Levaquin actually came back positive that her culture showed Pseudomonas only. Subsequently since being on the Levaquin and the wound has gotten much better she still has some burning but is not nearly as bad as what it was previous. 01-27-2022 upon  evaluation today patient appears to be doing pretty well in regard to her wound compared to where things were previous. Fortunately I do not see any evidence of infection locally nor systemically which is great news and overall I do believe that we are headed in the right direction. Overall the patient's wound seems to be greatly improved. 02-03-2022 upon evaluation today patient's wound is actually showing signs of improvement which is great news. Fortunately I do not see any evidence of worsening overall which is excellent and I do think she is headed in the appropriate direction. She is still having unfortunately discomfort but again I think this is definitely headed in the proper direction. 02-10-2022 upon evaluation today patient's wound is actually showing signs of being smaller and measuring better. Fortunately I do not see any evidence of infection and I was able to get the necrotic surface of the wound without any additional sharp debridement today. She actually tolerated this with minimal discomfort compared to what she was previously noted. Overall I am actually very pleased with where we stand currently. 02-17-2022 upon evaluation today patient actually appears to be making some progress here in regard to her leg although this is still not as small as we like to see it is not as deep as it has been either. Fortunately I do not see any evidence of active infection locally nor systemically which is great news. No fevers, chills, nausea, vomiting, or diarrhea. 03-03-2022  upon evaluation today patient appears to be doing well currently in regard to her wounds. She has been tolerating the dressing changes without complication and fortunately there does not appear to be any signs of active infection locally nor systemically at this point. No fevers, chills, nausea, vomiting, or diarrhea. I think she is definitely ready for Apligraf application today. 03-10-2022 upon evaluation today patient appears to be doing well currently in regard to her wound. The Apligraf does seem to be of benefit for her and the wound is measuring smaller which is great news. Fortunately I do not see any signs of active infection locally nor systemically which is great news. No fevers, chills, nausea, vomiting, or diarrhea. 03-17-2022 upon evaluation today patient appears to be doing well currently in regard to her wound. She has been tolerating the dressing changes without complication. Fortunately I do not see any signs of active infection locally nor systemically which is great news. No fevers, chills, nausea, vomiting, or [name], [name] ([id number]) [phone].pdf Page 5 of 6 diarrhea. 3/6; we have been treating a wound on the right medial ankle with Apligraf. Upon examination today there is no open wound however the area is very fragile still certainly enough to dress with Xeroform and a wrap. But I do not think he requires Apligraf. She has a stocking and waiting which is new Objective Constitutional Patient is hypertensive.. Pulse regular and within target range for patient.[name] Respirations regular, non-labored and within target range.. Temperature is normal and within the target range for the patient.[name] Appears in no distress. Vitals Time Taken: 11:19 AM, Temperature: 97.9 F, Pulse: 74 bpm, Respiratory Rate: 18 breaths/min, Blood Pressure: 176/77 mmHg. General Notes: Wound exam; the area on the right medial ankle is epithelialized but very fragile. There is no  evidence of infection. Edema control around this area is good no evidence of infection Integumentary (Hair, Skin) Wound #1 status is Healed - Epithelialized. Original cause of wound was Gradually Appeared. The date acquired was: 09/30/2021. The wound has been in treatment 22  weeks. The wound is located on the Right,Medial Malleolus. The wound measures 0cm length x 0cm width x 0cm depth; 0cm^2 area and 0cm^3 volume. There is Fat Layer (Subcutaneous Tissue) exposed. There is no tunneling or undermining noted. There is a medium amount of purulent drainage noted. The wound margin is distinct with the outline attached to the wound base. There is large (67-100%) granulation within the wound bed. There is no necrotic tissue within the wound bed. The periwound skin appearance did not exhibit: Callus, Crepitus, Excoriation, Induration, Rash, Scarring, Dry/Scaly, Maceration, Atrophie Blanche, Cyanosis, Ecchymosis, Hemosiderin Staining, Mottled, Pallor, Rubor, Erythema. Periwound temperature was noted as No Abnormality. The periwound has tenderness on palpation. 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 Post procedure Diagnosis Wound #: Same as Pre-Procedure Plan Follow-up Appointments: Return Appointment in 1 Kim. - w/ Jeri Cos (Dr. Dellia Nims covering) and Allayne Butcher Rm # 4 Wednesday 03/31/22 @ 11:00 Other: - Bring in compression stockings next Kim. Anesthetic: (In clinic) Topical Lidocaine 5% applied to wound bed Cellular or Tissue Based Products: Cellular or Tissue Based Product Type: - Apligraf- RUN IVR 02/10/22 approved 20% coinsurance per application. will order and plan to apply apligraf 03/03/2022 0930. Apligraf # 1 applied 03/03/22 Apligraf #  2 applied 03/10/22 Apligraf # 3 applied 03/17/22 hold apligraf #4- wound closed today. Bathing/ Shower/ Hygiene: May shower with protection but do not get wound dressing(s) wet. Protect dressing(s) with water repellant cover (for example, large plastic bag) or a cast cover and may then take shower. Edema Control - Lymphedema / SCD / Other: Elevate legs to the level of the heart or above for 30 minutes daily and/or when sitting for 3-4 times a day throughout the day. Avoid standing for long periods of time. Exercise regularly Other Edema Control Orders/Instructions: - lotion to right leg, xerform to closed area, 3 layer compression to right leg. If compression wraps slide down please call wound center and speak with a nurse. 1. Xeroform 3 layer compression 2. No need for an Apligraf today TISHAWN, DIGGS [name] ([id number]) [id number] Page 6 of 6 3. Hopefully healed by next Kim Electronic Signature(s) Signed: 03/24/2022 4:27:04 PM By: Linton Ham MD Entered By: Linton Ham on 03/24/2022 12:03:29 -------------------------------------------------------------------------------- SuperBill Details Patient Name: Date of Service: [name], Shavanna [name]. 03/24/2022 Medical Record Number: [id number] Patient Account Number: [id number] Date of Birth/Sex: Treating RN: [birth year] (81 y.o. [name], [name] Primary Care Provider: [name] Other Clinician: Referring Provider: Treating Provider/Extender: Earnest Conroy Weeks in Treatment: 22 Diagnosis Coding ICD-10 Codes Code Description [phone] 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 I87.331 Chronic venous hypertension (idiopathic) with ulcer and inflammation of right lower extremity Facility Procedures : CPT4 Code: [id number] Description: (Facility Use Only) [phone] - [facility] COMPRS LWR RT LEG Modifier: Quantity: 1 Physician  Procedures : CPT4 Code Description Modifier [id number] - WC PHYS LEVEL 3 - EST PT ICD-10 Diagnosis Description [id number] 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

## 2022-03-27 NOTE — Progress Notes (Signed)
Kim, [name] [name] ([id number]) [id number] Page 1 of 7 Visit Report for 03/24/2022 Arrival Information Details Patient Name: Date of Service: Kim, [name]. 03/24/2022 11:00 A M Medical Record Number: [id number] Patient Account Number: [id number] Date of Birth/Sex: Treating RN: [birth year] (82 y.o. F) Primary Care [name]: [name] Other Clinician: Referring Kim Brandel: Treating Kim Mccutcheon/Extender: [name] in Treatment: 71 Visit Information History Since Last Visit Added or deleted any medications: No Patient Arrived: Ambulatory Any new allergies or adverse reactions: No Arrival Time: 11:18 Had a fall or experienced change in No Accompanied By: husband activities of daily living that may affect Transfer Assistance: None risk of falls: Patient Identification Verified: Yes Signs or symptoms of abuse/neglect since last visito No Secondary Verification Process Completed: Yes Hospitalized since last visit: No Patient Requires Transmission-Based Precautions: No Implantable device outside of the clinic excluding No Patient Has Alerts: No cellular tissue based products placed in the center since last visit: Has Compression in Place as Prescribed: Yes Pain Present Now: Yes Electronic Signature(s) Signed: 03/25/2022 3:53:55 PM By: Erenest Blank Entered By: Erenest Blank on 03/24/2022 11:19:23 -------------------------------------------------------------------------------- Compression Therapy Details Patient Name: Date of Service: [name], [name]. 03/24/2022 11:00 A M Medical Record Number: [id number] Patient Account Number: [id number] Date of Birth/Sex: Treating RN: [birth year] (82 y.o. [name], Tammi Klippel Primary Care Samarra Ridgely: [name] Other Clinician: Referring Jaimon Bugaj: Treating Aino Heckert/Extender: [name] in Treatment: 22 Compression Therapy Performed for Wound  Assessment: Non-Wound Location Performed By: Clinician Deon Pilling, RN Compression Type: Three Layer Location: Lower Extremity, Right Post Procedure Diagnosis Same as Pre-procedure Electronic Signature(s) Signed: 03/24/2022 5:05:26 PM By: Deon Pilling RN, BSN Entered By: Deon Pilling on 03/24/2022 11:46:33 -------------------------------------------------------------------------------- Encounter Discharge Information Details Patient Name: Date of Service: [name], [name]. 03/24/2022 11:00 A M Medical Record Number: [id number] Patient Account Number: [id number] Date of Birth/Sex: Treating RN: 08-16-1940 (82 y.o. [name], Meta.Reding Primary Care Christyana Corwin: [name] Other Clinician: Referring Letticia Bhattacharyya: Treating Javia Dillow/Extender: [name] in Treatment: 22 Encounter Discharge Information Items Discharge Condition: Stable Ambulatory Status: Ambulatory Discharge Destination: Home Transportation: [address] [name], [name] [name] ([id number]) [id number] Page 2 of 7 Accompanied By: self Schedule Follow-up Appointment: Yes Clinical Summary of Care: Electronic Signature(s) Signed: 03/24/2022 5:05:26 PM By: Deon Pilling RN, BSN Entered By: Deon Pilling on 03/24/2022 11:48:50 -------------------------------------------------------------------------------- Lower Extremity Assessment Details Patient Name: Date of Service: [name], [name]. 03/24/2022 11:00 A M Medical Record Number: [id number] Patient Account Number: [id number] Date of Birth/Sex: Treating RN: 1940/08/02 (82 y.o. F) Primary Care Joann Kulpa: [name] Other Clinician: Referring Destine Ambroise: Treating Aurianna Earlywine/Extender: [name] in Treatment: 22 Edema Assessment Assessed: [name]: No] Patrice Paradise: No] Edema: [Left: N] [Right: o] Calf Left: Right: Point of Measurement: 37 cm From Medial Instep 38 cm Ankle Left:  Right: Point of Measurement: 8 cm From Medial Instep 21 cm Electronic Signature(s) Signed: 03/25/2022 3:53:55 PM By: Erenest Blank Entered By: Erenest Blank on 03/24/2022 11:31:16 -------------------------------------------------------------------------------- Multi Wound Chart Details Patient Name: Date of Service: [name], [name]. 03/24/2022 11:00 A M Medical Record Number: [id number] Patient Account Number: [id number] Date of Birth/Sex: Treating RN: 03/15/1940 (82 y.o. F) Primary Care Wing Gfeller: [name] Other Clinician: Referring Kalvyn Desa: Treating Mckinnley Cottier/Extender: [name] in Treatment: 22 Vital Signs Height(in): Pulse(bpm): 74 Weight(lbs): Blood Pressure(mmHg): 176/77 Body Mass Index(BMI): Temperature(F): 97.9 Respiratory Rate(breaths/min): 18 [1:Photos:] [  N/A:N/A] Right, Medial Malleolus N/A N/A Wound Location: Gradually Appeared N/A N/A Wounding Event: Venous Leg Ulcer N/A N/A Primary Etiology: Peripheral Venous Disease N/A N/A Comorbid HistoryALEXSA, [name] [name] ([id number]) [phone].pdf Page 3 of 7 09/30/2021 N/A N/A Date Acquired: 25 N/A N/A [name] of Treatment: Healed - Epithelialized N/A N/A Wound Status: No N/A N/A Wound Recurrence: 0x0x0 N/A N/A Measurements L x W x D (cm) 0 N/A N/A A (cm) : rea 0 N/A N/A Volume (cm) : 100.00% N/A N/A % Reduction in Area: 100.00% N/A N/A % Reduction in Volume: Full Thickness With Exposed Support N/A N/A Classification: Structures Medium N/A N/A Exudate Amount: Purulent N/A N/A Exudate Type: yellow, brown, green N/A N/A Exudate Color: Distinct, outline attached N/A N/A Wound Margin: Large (67-100%) N/A N/A Granulation Amount: None Present (0%) N/A N/A Necrotic Amount: Fat Layer (Subcutaneous Tissue): Yes N/A N/A Exposed Structures: Fascia: No Tendon: No Muscle: No Joint: No Bone: No Medium (34-66%) N/A  N/A Epithelialization: Excoriation: No N/A N/A Periwound Skin Texture: Induration: No Callus: No Crepitus: No Rash: No Scarring: No Maceration: No N/A N/A Periwound Skin Moisture: Dry/Scaly: No Atrophie Blanche: No N/A N/A Periwound Skin Color: Cyanosis: No Ecchymosis: No Erythema: No Hemosiderin Staining: No Mottled: No Pallor: No Rubor: No No Abnormality N/A N/A Temperature: Yes N/A N/A Tenderness on Palpation: Treatment Notes Wound #1 (Malleolus) Wound Laterality: Right, Medial Cleanser Peri-Wound Care Topical Primary Dressing Secondary Dressing Secured With Compression Wrap Compression Stockings Add-Ons Notes lotion, xerform, 3 layer compression to right leg. Electronic Signature(s) Signed: 03/24/2022 4:27:04 PM By: Linton Ham MD Entered By: Linton Ham on 03/24/2022 11:55:26 -------------------------------------------------------------------------------- Multi-Disciplinary Care Plan Details Patient Name: Date of Service: [name], [name]. 03/24/2022 11:00 A M Medical Record Number: [id number] Patient Account Number: [id number] Date of Birth/Sex: Treating RN: 1940-10-02 (82 y.o. [name], Meta.Reding Primary Care Jamee Pacholski: [name] Other Clinician: Referring [place]: Treating Kyser Wandel/Extender: Tyeisha, Pautsch, Chanese [name] ([id number]) [id number] Page 4 of 7 [name] in Treatment: 22 Active Inactive Wound/Skin Impairment Nursing Diagnoses: Impaired tissue integrity Knowledge deficit related to smoking impact on wound healing Goals: Patient will have a decrease in wound volume by X% from date: (specify in notes) Date Initiated: 10/21/2021 Target Resolution Date: 04/02/2022 Goal Status: Active Patient/caregiver will verbalize understanding of skin care regimen Date Initiated: 10/21/2021 Date Inactivated: 11/11/2021 Target Resolution Date: 11/21/2021 Goal Status: Met Ulcer/skin breakdown  will have a volume reduction of 30% by week 4 Date Initiated: 10/21/2021 Date Inactivated: 01/13/2022 Target Resolution Date: 01/16/2022 Unmet Reason: see wound Goal Status: Unmet measurement. Interventions: Assess patient/caregiver ability to obtain necessary supplies Assess patient/caregiver ability to perform ulcer/skin care regimen upon admission and as needed Assess ulceration(s) every visit Notes: Electronic Signature(s) Signed: 03/24/2022 5:05:26 PM By: Deon Pilling RN, BSN Entered By: Deon Pilling on 03/24/2022 11:38:15 -------------------------------------------------------------------------------- Pain Assessment Details Patient Name: Date of Service: [name], [name]. 03/24/2022 11:00 A M Medical Record Number: [id number] Patient Account Number: [id number] Date of Birth/Sex: Treating RN: 09-12-1940 (82 y.o. F) Primary Care Maliea Grandmaison: [name] Other Clinician: Referring Gizella [place]: Treating Macaria Bias/Extender: [name] in Treatment: 22 Active Problems Location of Pain Severity and Description of Pain Patient Has Paino Yes Site Locations Pain Location: Generalized Pain, Pain in Ulcers Rate the pain. Current Pain Level: 10 Pain Management and Medication Current Pain Management: REVAN, STROJNY ([id number]) [phone].pdf Page 5 of 7 Electronic Signature(s) Signed: 03/25/2022 3:53:55 PM By: Erenest Blank Entered By: Barbarann Ehlers,  Joelene Millin on 03/24/2022 11:20:21 -------------------------------------------------------------------------------- Patient/Caregiver Education Details Patient Name: Date of Service: [name], [name] 3/6/2024andnbsp11:00 A M Medical Record Number: [id number] Patient Account Number: [id number] Date of Birth/Gender: Treating RN: Dec 03, 1940 (82 y.o. [name], Tammi Klippel Primary Care Physician: [name] Other Clinician: Referring Physician: Treating Physician/Extender: Veneta Penton in Treatment: 22 Education Assessment Education Provided To: Patient Education Topics Provided Wound/Skin Impairment: Handouts: Caring for Your Ulcer Methods: Explain/Verbal Responses: Reinforcements needed Electronic Signature(s) Signed: 03/24/2022 5:05:26 PM By: Deon Pilling RN, BSN Entered By: Deon Pilling on 03/24/2022 11:38:33 -------------------------------------------------------------------------------- Wound Assessment Details Patient Name: Date of Service: [name], [name]. 03/24/2022 11:00 A M Medical Record Number: [id number] Patient Account Number: [id number] Date of Birth/Sex: Treating RN: [birth year] (82 y.o. [name], Meta.Reding Primary Care Jeneal Vogl: [name] Other Clinician: Referring Maury Bamba: Treating Maeley Matton/Extender: [name] in Treatment: 22 Wound Status Wound Number: 1 Primary Etiology: Venous Leg Ulcer Wound Location: Right, Medial Malleolus Wound Status: Healed - Epithelialized Wounding Event: Gradually Appeared Comorbid History: Peripheral Venous Disease Date Acquired: 09/30/2021 [name] Of Treatment: 22 Clustered Wound: No Photos Wound Measurements Length: (cm) Width: (cm) Depth: (cm) Area: (cm) Volume: (cm) Dunlap, Othell [name] ([id number]) 0 % Reduction in Area: 100% 0 % Reduction in Volume: 100% 0 Epithelialization: Medium (34-66%) 0 Tunneling: No 0 Undermining: No [id number] Page 6 of 7 Wound Description Classification: Full Thickness With Exposed Suppor Wound Margin: Distinct, outline attached Exudate Amount: Medium Exudate Type: Purulent Exudate Color: yellow, brown, green [name] Structures Foul Odor After Cleansing: No Slough/Fibrino No Wound Bed Granulation Amount: Large (67-100%) Exposed Structure Necrotic Amount: None Present (0%) Fascia Exposed: No Fat Layer (Subcutaneous Tissue) Exposed: Yes Tendon Exposed: No Muscle Exposed:  No Joint Exposed: No Bone Exposed: No Periwound Skin Texture Texture Color No Abnormalities Noted: No No Abnormalities Noted: No Callus: No Atrophie Blanche: No Crepitus: No Cyanosis: No Excoriation: No Ecchymosis: No Induration: No Erythema: No Rash: No Hemosiderin Staining: No Scarring: No Mottled: No Pallor: No Moisture Rubor: No No Abnormalities Noted: No Dry / Scaly: No Temperature / Pain Maceration: No Temperature: No Abnormality Tenderness on Palpation: Yes Treatment Notes Wound #1 (Malleolus) Wound Laterality: Right, Medial Cleanser Peri-Wound Care Topical Primary Dressing Secondary Dressing Secured With Compression Wrap Compression Stockings Add-Ons Notes lotion, xerform, 3 layer compression to right leg. Electronic Signature(s) Signed: 03/24/2022 5:05:26 PM By: Deon Pilling RN, BSN Entered By: Deon Pilling on 03/24/2022 11:46:17 -------------------------------------------------------------------------------- Vitals Details Patient Name: Date of Service: [name], [name]. 03/24/2022 11:00 A M Medical Record Number: [id number] Patient Account Number: [id number] Date of Birth/Sex: Treating RN: [birth year] (82 y.o. F) Primary Care Amarri Satterly: [name] Other Clinician: Referring Suriya Kovarik: Treating Alizea Pell/Extender: [name] in Treatment: 22 Vital Signs Time Taken: 11:19 Temperature (F): 97.9 Pulse (bpm): 74 Hardwick, Ryleigh [name] ([id number]) [phone].pdf Page 7 of 7 Respiratory Rate (breaths/min): 18 Blood Pressure (mmHg): 176/77 Reference Range: 80 - 120 mg / dl Electronic Signature(s) Signed: 03/25/2022 3:53:55 PM By: Erenest Blank Entered By: Erenest Blank on 03/24/2022 11:19:53

## 2022-03-28 NOTE — Telephone Encounter (Signed)
Noted. Thanks.

## 2022-03-31 ENCOUNTER — Encounter (HOSPITAL_BASED_OUTPATIENT_CLINIC_OR_DEPARTMENT_OTHER): Payer: Medicare Other | Admitting: Internal Medicine

## 2022-03-31 DIAGNOSIS — I87331 Chronic venous hypertension (idiopathic) with ulcer and inflammation of right lower extremity: Secondary | ICD-10-CM

## 2022-03-31 DIAGNOSIS — L950 Livedoid vasculitis: Secondary | ICD-10-CM | POA: Diagnosis not present

## 2022-03-31 DIAGNOSIS — L97312 Non-pressure chronic ulcer of right ankle with fat layer exposed: Secondary | ICD-10-CM | POA: Diagnosis not present

## 2022-03-31 DIAGNOSIS — H353132 Nonexudative age-related macular degeneration, bilateral, intermediate dry stage: Secondary | ICD-10-CM | POA: Diagnosis not present

## 2022-04-01 NOTE — Progress Notes (Addendum)
[name], [name] ([id number]) [id number] Page 1 of 5 Visit Report for 03/31/2022 Arrival Information Details Patient Name: Date of Service: [name], [name] 03/31/2022 2:45 PM Medical Record Number: [id number] Patient Account Number: [id number] Date of Birth/Sex: Treating RN: October 08, 1940 (82 y.o. [name], [name] Primary Care [name]: [name] Other Clinician: Referring Jalila Goodnough: Treating Bertram Haddix/Extender: Valda Lamb Weeks in Treatment: 23 Visit Information History Since Last Visit Added or deleted any medications: No Patient Arrived: Ambulatory Any new allergies or adverse reactions: No Arrival Time: 14:07 Had a fall or experienced change in No Accompanied By: husband activities of daily living that may affect Transfer Assistance: None risk of falls: Patient Identification Verified: Yes Signs or symptoms of abuse/neglect since last visito No Secondary Verification Process Completed: Yes Hospitalized since last visit: No Patient Requires Transmission-Based Precautions: No Implantable device outside of the clinic excluding No Patient Has Alerts: No cellular tissue based products placed in the center since last visit: Has Dressing in Place as Prescribed: Yes Has Compression in Place as Prescribed: Yes Pain Present Now: No Electronic Signature(s) Signed: 03/31/2022 4:00:04 PM By: Rhae Hammock RN Entered By: Rhae Hammock on 03/31/2022 14:07:45 -------------------------------------------------------------------------------- Clinic Level of Care Assessment Details Patient Name: Date of Service: [name], [name] 03/31/2022 2:45 PM Medical Record Number: [id number] Patient Account Number: [id number] Date of Birth/Sex: Treating RN: Jan 29, 1940 (82 y.o. [name], [name] Primary Care Jacquelyn Antony: [name] Other Clinician: Referring Glorian Mcdonell: Treating Elysha Daw/Extender: Valda Lamb Weeks in Treatment: 23 Clinic Level of Care Assessment Items TOOL 4 Quantity Score X- 1 0 Use when only an EandM is performed on FOLLOW-UP visit ASSESSMENTS - Nursing Assessment / Reassessment X- 1 10 Reassessment of Co-morbidities (includes updates in patient status) X- 1 5 Reassessment of Adherence to Treatment Plan ASSESSMENTS - Wound and Skin A ssessment / Reassessment X - Simple Wound Assessment / Reassessment - one wound 1 5 []  - 0 Complex Wound Assessment / Reassessment - multiple wounds []  - 0 Dermatologic / Skin Assessment (not related to wound area) ASSESSMENTS - Focused Assessment X- 1 5 Circumferential Edema Measurements - multi extremities []  - 0 Nutritional Assessment / Counseling / Intervention []  - 0 Lower Extremity Assessment (monofilament, tuning fork, pulses) []  - 0 Peripheral Arterial Disease Assessment (using hand held doppler) ASSESSMENTS - Ostomy and/or Continence Assessment and Care []  - 0 Incontinence Assessment and Management []  - 0 Ostomy Care Assessment and Management (repouching, etc.) PROCESS - Coordination of Care AMEISHA, BIELEFELDT [name] ([id number]) [id number] Page 2 of 5 X- 1 15 Simple Patient / Family Education for ongoing care []  - 0 Complex (extensive) Patient / Family Education for ongoing care X- 1 10 Staff obtains Programmer, systems, Records, [name] Results / Process Orders est []  - 0 Staff telephones HHA, Nursing Homes / Clarify orders / etc []  - 0 Routine Transfer to another Facility (non-emergent condition) []  - 0 Routine Hospital Admission (non-emergent condition) []  - 0 New Admissions / Biomedical engineer / Ordering NPWT Apligraf, etc. , []  - 0 Emergency Hospital Admission (emergent condition) X- 1 10 Simple Discharge Coordination []  - 0 Complex (extensive) Discharge Coordination PROCESS - Special Needs []  - 0 Pediatric / Minor Patient Management []  - 0 Isolation Patient  Management []  - 0 Hearing / Language / Visual special needs []  - 0 Assessment of Community assistance (transportation, D/C planning, etc.) []  - 0 Additional assistance / Altered mentation []  - 0 Support Surface(s) Assessment (bed, cushion, seat, etc.)  INTERVENTIONS - Wound Cleansing / Measurement X - Simple Wound Cleansing - one wound 1 5 []  - 0 Complex Wound Cleansing - multiple wounds X- 1 5 Wound Imaging (photographs - any number of wounds) []  - 0 Wound Tracing (instead of photographs) X- 1 5 Simple Wound Measurement - one wound []  - 0 Complex Wound Measurement - multiple wounds INTERVENTIONS - Wound Dressings X - Small Wound Dressing one or multiple wounds 1 10 []  - 0 Medium Wound Dressing one or multiple wounds []  - 0 Large Wound Dressing one or multiple wounds []  - 0 Application of Medications - topical []  - 0 Application of Medications - injection INTERVENTIONS - Miscellaneous []  - 0 External ear exam []  - 0 Specimen Collection (cultures, biopsies, blood, body fluids, etc.) []  - 0 Specimen(s) / Culture(s) sent or taken to Lab for analysis []  - 0 Patient Transfer (multiple staff / Civil Service fast streamer / Similar devices) []  - 0 Simple Staple / Suture removal (25 or less) []  - 0 Complex Staple / Suture removal (26 or more) []  - 0 Hypo / Hyperglycemic Management (close monitor of Blood Glucose) []  - 0 Ankle / Brachial Index (ABI) - do not check if billed separately X- 1 5 Vital Signs Has the patient been seen at the hospital within the last three years: Yes Total Score: 90 Level Of Care: New/Established - Level 3 Electronic Signature(s) Signed: 03/31/2022 4:00:04 PM By: Rhae Hammock RN [name], [name] ([id number]) [id number] Page 3 of 5 Entered By: Rhae Hammock on 03/31/2022 14:23:00 -------------------------------------------------------------------------------- Encounter Discharge Information Details Patient Name: Date of  Service: [name], [name] 03/31/2022 2:45 PM Medical Record Number: [id number] Patient Account Number: [id number] Date of Birth/Sex: Treating RN: 1941-01-16 (82 y.o. [name], [name] Primary Care Sharni Negron: [name] Other Clinician: Referring Evalie Hargraves: Treating Viki Carrera/Extender: Valda Lamb Weeks in Treatment: 23 Encounter Discharge Information Items Discharge Condition: Stable Ambulatory Status: Ambulatory Discharge Destination: Home Transportation: Private Auto Accompanied By: self Schedule Follow-up Appointment: Yes Clinical Summary of Care: Patient Declined Electronic Signature(s) Signed: 03/31/2022 4:00:04 PM By: Rhae Hammock RN Entered By: Rhae Hammock on 03/31/2022 14:23:51 -------------------------------------------------------------------------------- Lower Extremity Assessment Details Patient Name: Date of Service: Kim Lance [name]. 03/31/2022 2:45 PM Medical Record Number: [id number] Patient Account Number: [id number] Date of Birth/Sex: Treating RN: 11-20-1940 (82 y.o. [name], [name] Primary Care Mayerli Kirst: [name] Other Clinician: Referring Elvena Oyer: Treating Felicita Nuncio/Extender: Valda Lamb Weeks in Treatment: 23 Edema Assessment Assessed: [name]: No] Patrice Paradise: Yes] Edema: [Left: N] [Right: o] Calf Left: Right: Point of Measurement: 37 cm From Medial Instep 38 cm Ankle Left: Right: Point of Measurement: 8 cm From Medial Instep 21 cm Vascular Assessment Pulses: Dorsalis Pedis Palpable: [Right:Yes] Posterior Tibial Palpable: [Right:Yes] Electronic Signature(s) Signed: 03/31/2022 4:00:04 PM By: Rhae Hammock RN Entered By: Rhae Hammock on 03/31/2022 14:07:58 -------------------------------------------------------------------------------- Multi Wound Chart Details Patient Name: Date of Service: [name], [name]. 03/31/2022 2:45 PM Medical Record Number:  [id number] Patient Account Number: [id number] Date of Birth/Sex: Treating RN: [birth year] (82 y.o. Lanetra Herriges, Ophelia [name] ([id number]) [id number] Page 4 of 5 Primary Care Loveah Like: [name] Other Clinician: Referring Sanora Cunanan: Treating Soliana Kitko/Extender: Valda Lamb Weeks in Treatment: 23 Vital Signs Height(in): Pulse(bpm): 74 Weight(lbs): Blood Pressure(mmHg): 176/74 Body Mass Index(BMI): Temperature(F): 98.7 Respiratory Rate(breaths/min): 17 [Treatment Notes:Wound Assessments Treatment Notes] Electronic Signature(s) Signed: 04/01/2022 3:34:03 PM By: Kalman Shan DO Entered By: Kalman Shan on 03/31/2022 16:43:38 -------------------------------------------------------------------------------- Multi-Disciplinary Care  Plan Details Patient Name: Date of Service: [name], [name] 03/31/2022 2:45 PM Medical Record Number: [id number] Patient Account Number: [id number] Date of Birth/Sex: Treating RN: 05-25-1940 (82 y.o. [name], [name] Primary Care Shamiya Demeritt: [name] Other Clinician: Referring Corrisa Gibby: Treating Cyla Haluska/Extender: Valda Lamb Weeks in Treatment: 23 Active Inactive Electronic Signature(s) Signed: 03/31/2022 4:00:04 PM By: Rhae Hammock RN Entered By: Rhae Hammock on 03/31/2022 14:08:29 -------------------------------------------------------------------------------- Pain Assessment Details Patient Name: Date of Service: [name], [name]. 03/31/2022 2:45 PM Medical Record Number: [id number] Patient Account Number: [id number] Date of Birth/Sex: Treating RN: 1940/06/23 (82 y.o. [name], [name] Primary Care Janny Crute: [name] Other Clinician: Referring Tamorah Hada: Treating Beya Tipps/Extender: Valda Lamb Weeks in Treatment: 23 Active Problems Location of Pain Severity and Description of Pain Patient Has Paino  No Site Locations [place], [place] [name] ([id number]) [id number] Page 5 of 5 Pain Management and Medication Current Pain Management: Electronic Signature(s) Signed: 03/31/2022 4:00:04 PM By: Rhae Hammock RN Entered By: Rhae Hammock on 03/31/2022 14:07:52 -------------------------------------------------------------------------------- Patient/Caregiver Education Details Patient Name: Date of Service: [name] 3/13/2024andnbsp2:45 PM Medical Record Number: [id number] Patient Account Number: [id number] Date of Birth/Gender: Treating RN: 02-21-1940 (82 y.o. [name], [place] Primary Care Physician: [name] Other Clinician: Referring Physician: Treating Physician/Extender: Rudolpho Sevin in Treatment: 23 Education Assessment Education Provided To: Patient Education Topics Provided Wound/Skin Impairment: Methods: Explain/Verbal Responses: Reinforcements needed, State content correctly Electronic Signature(s) Signed: 03/31/2022 4:00:04 PM By: Rhae Hammock RN Entered By: Rhae Hammock on 03/31/2022 14:08:41 -------------------------------------------------------------------------------- Vitals Details Patient Name: Date of Service: [name], [name]. 03/31/2022 2:45 PM Medical Record Number: [id number] Patient Account Number: [id number] Date of Birth/Sex: Treating RN: [birth year] (82 y.o. [name], [name] Primary Care Novi Calia: [name] Other Clinician: Referring Eloni Darius: Treating Anida Deol/Extender: Valda Lamb Weeks in Treatment: 23 Vital Signs Time Taken: 14:08 Temperature (F): 98.7 Pulse (bpm): 74 Respiratory Rate (breaths/min): 17 Blood Pressure (mmHg): 176/74 Reference Range: 80 - 120 mg / dl Electronic Signature(s) Signed: 03/31/2022 4:00:04 PM By: Rhae Hammock RN Entered By: Rhae Hammock on 03/31/2022 14:08:13

## 2022-04-02 NOTE — Progress Notes (Signed)
TAYGAN, WHITTIKER [name] ([id number]) [id number] Page 1 of 7 Visit Report for 03/31/2022 Chief Complaint Document Details Patient Name: Date of Service: [name], [name] 03/31/2022 2:45 PM Medical Record Number: [id number] Patient Account Number: [id number] Date of Birth/Sex: Treating RN: Oct 13, 1940 (82 y.o. F) Primary Care Provider: [name] Other Clinician: Referring Provider: Treating Provider/Extender: Valda Lamb Weeks in Treatment: 23 Information Obtained from: Patient Chief Complaint Right ankle ulcer Electronic Signature(s) Signed: 04/01/2022 3:34:03 PM By: Kalman Shan DO Entered By: Kalman Shan on 03/31/2022 16:43:43 -------------------------------------------------------------------------------- HPI Details Patient Name: Date of Service: [name], [name]. 03/31/2022 2:45 PM Medical Record Number: [id number] Patient Account Number: [id number] Date of Birth/Sex: Treating RN: 04/21/1940 (82 y.o. F) Primary Care Provider: [name] Other Clinician: Referring Provider: Treating Provider/Extender: Valda Lamb Weeks in Treatment: 23 History of Present Illness HPI Description: 10-21-2021 upon evaluation today patient appears to be doing poorly in regard to the wound over the medial aspect of her right ankle. This is an area that previously she has had trouble with previously. I have actually seen her in [place] about a year ago but this was on the left ankle at that time. 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 This is also the typical spot for her as it is a very difficult area to compress. Nonetheless previously we have been able to get this under control using her compression socks as well as Xeroform gauze although I am not sure that is can be the best thing for her at this time we discussed that as we  proceed. 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 Nonetheless I do think that we could potentially try some oral steroids, prednisone, to see if we can help calm this down. She is not opposed to this at all. In fact she would like to do anything she can to try to get better she tells me the Achilles area has been very painful at times for her. I really do not see any signs of infection right now but that symptomatic = as well. 10/18; patient with wounds looks a lot better today measuring smaller. She went to her grandsons wedding this weekend did not wear compression wraps. She completed her course of prednisone 50 mg we have been using TCA and Hydrofera Blue on the wound area. 11-11-2021 upon evaluation today patient appears to be doing well currently in regard to her wound in fact she is showing signs of good granulation and epithelization at this point and I am very pleased with where we stand currently. I do not see any signs of infection locally or systemically which is great news. No fevers, chills, nausea, vomiting, or diarrhea. I do believe that the anti-inflammatories did do well for her. 11-18-2021 upon evaluation today patient's wound is actually showing signs of excellent improvement and very pleased with where we were seeing this improvement thus far. I do not see any evidence of active infection locally or systemically which is great news and overall I do believe we are on the right track here. 11/8; patient comes  in complaining of marked pain in this area. She takes Tylenol for the discomfort. Unfortunately we are not making much progress in the condition of this wound still slough  covered. 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 With that being said there does not appear to be any signs of infection which is good news but the wound does not appear to be quite as healthy as what I saw the last time I saw her in 2 weeks and looking back at pictures I really feel like that there were very small compared to where we started there was a little bit of discoloration of the base of the wound that was not there last week. Some of this was probably just due to bleeding which is understandable from and following debridement but at the same time I do believe that nonetheless she still would benefit from the compression wrapping currently. She is definitely more swollen today than she was previous and she realizes this as well. 12-09-2021 upon evaluation today patient appears to be doing a little better in regard to her wound compared to last time although size wise is not much different it does seem to be improving with regard to the necrotic tissue that is turning more yellow and loosening up which is at least good news. With that being said I do believe that the patient would benefit from likely switching to Santyl this is good to me and we cannot wrap her and she is getting need to actually change this more frequently at home using her compression sock but I think that it may be a better way to go at the moment to see if we can get this cleaned up she is in agreement with that plan. If we get to where we can use collagen then we will consider going back to the wrap. 12-23-2021 upon evaluation today patient's wound does have some necrotic tissue noted at this point and we are going to need and  clean this away today. Kim, MEDLOCK [name] ([id number]) [id number] Page 2 of 7 discussed that with her. With that being said fortunately there does not appear to be any signs of infection locally or systemically which is great news and overall I am extremely pleased in that regard. Her husband is changing this daily and that does seem to be benefiting her at this point. The wound is a bit smaller today compared to previous evaluation. 01-06-2022 upon evaluation today patient unfortunately appears to be doing worse in regard to her wound. Actually got a call from her yesterday in [place] because she told me that her foot and ankle area was doing much worse with increased pain. Fortunately I do not see any signs of infection systemically though locally I do's definitely see signs of cellulitis. I discussed that with her today. Nonetheless I do believe that she would benefit from continuation of the antibiotics which I placed her on yesterday. This includes the Bactrim DS although we may need to add something different depend on what the results of the culture come back showing. We are going to do a PCR culture today. Subsequently I am also going to go ahead and give the patient gentamicin cream which I am going  to send into the pharmacy for her at this point. 01/13/2022: The culture that was taken last week returned positive for Pseudomonas, which certainly would not be covered by the Bactrim that was prescribed. She is having more pain in the wound and continues to have blue-green drainage on her dressing. There is substantial slough within the wound. 01-20-2022 upon evaluation today patient appears to be doing well currently in regard to the her wound. This looks much better than last time I saw her. Fortunately she did get on the right antibiotic had to be switched to Levaquin actually came back positive that her culture showed Pseudomonas only. Subsequently since being  on the Levaquin and the wound has gotten much better she still has some burning but is not nearly as bad as what it was previous. 01-27-2022 upon evaluation today patient appears to be doing pretty well in regard to her wound compared to where things were previous. Fortunately I do not see any evidence of infection locally nor systemically which is great news and overall I do believe that we are headed in the right direction. Overall the patient's wound seems to be greatly improved. 02-03-2022 upon evaluation today patient's wound is actually showing signs of improvement which is great news. Fortunately I do not see any evidence of worsening overall which is excellent and I do think she is headed in the appropriate direction. She is still having unfortunately discomfort but again I think this is definitely headed in the proper direction. 02-10-2022 upon evaluation today patient's wound is actually showing signs of being smaller and measuring better. Fortunately I do not see any evidence of infection and I was able to get the necrotic surface of the wound without any additional sharp debridement today. She actually tolerated this with minimal discomfort compared to what she was previously noted. Overall I am actually very pleased with where we stand currently. 02-17-2022 upon evaluation today patient actually appears to be making some progress here in regard to her leg although this is still not as small as we like to see it is not as deep as it has been either. Fortunately I do not see any evidence of active infection locally nor systemically which is great news. No fevers, chills, nausea, vomiting, or diarrhea. 03-03-2022 upon evaluation today patient appears to be doing well currently in regard to her wounds. She has been tolerating the dressing changes without complication and fortunately there does not appear to be any signs of active infection locally nor systemically at this point. No fevers, chills,  nausea, vomiting, or diarrhea. I think she is definitely ready for Apligraf application today. 03-10-2022 upon evaluation today patient appears to be doing well currently in regard to her wound. The Apligraf does seem to be of benefit for her and the wound is measuring smaller which is great news. Fortunately I do not see any signs of active infection locally nor systemically which is great news. No fevers, chills, nausea, vomiting, or diarrhea. 03-17-2022 upon evaluation today patient appears to be doing well currently in regard to her wound. She has been tolerating the dressing changes without complication. Fortunately I do not see any signs of active infection locally nor systemically which is great news. No fevers, chills, nausea, vomiting, or diarrhea. 3/6; we have been treating a wound on the right medial ankle with Apligraf. Upon examination today there is no open wound however the area is very fragile still certainly enough to dress with Xeroform and a wrap. But I  do not think he requires Apligraf. She has a stocking and waiting which is new 3/13; patient presents for follow-up. We have been using Xeroform under compression wrap. Her wound has healed to the right ankle. Prior to this she has been using Apligraf to help with wound closure. She has her compression stockings with her. Electronic Signature(s) Signed: 04/01/2022 3:34:03 PM By: Kalman Shan DO Entered By: Kalman Shan on 03/31/2022 16:44:32 -------------------------------------------------------------------------------- Physical Exam Details Patient Name: Date of Service: Kim Lance [name]. 03/31/2022 2:45 PM Medical Record Number: [id number] Patient Account Number: [id number] Date of Birth/Sex: Treating RN: 1940-05-17 (82 y.o. F) Primary Care Provider: [name] Other Clinician: Referring Provider: Treating Provider/Extender: Valda Lamb Weeks in Treatment:  23 Constitutional respirations regular, non-labored and within target range for patient.. Cardiovascular 2+ dorsalis pedis/posterior tibialis pulses. Psychiatric pleasant and cooperative. Notes [name] the right medial ankle there is epithelization to the previous wound site. Good edema control. No signs of surrounding infection. o Electronic Signature(s) Signed: 04/01/2022 3:34:03 PM By: Birdie Riddle, Aryianna [name] ([id number]) [id number] Page 3 of 7 Entered By: Kalman Shan on 03/31/2022 16:44:59 -------------------------------------------------------------------------------- Physician Orders Details Patient Name: Date of Service: BRITTIANY, MESSMAN 03/31/2022 2:45 PM Medical Record Number: [id number] Patient Account Number: [id number] Date of Birth/Sex: Treating RN: 05-28-1940 (82 y.o. [name], [name] Primary Care Provider: [name] Other Clinician: Referring Provider: Treating Provider/Extender: Valda Lamb Weeks in Treatment: 71 Verbal / Phone Orders: No Diagnosis Coding Discharge From [facility] Services Discharge from [place] Signature(s) Signed: 04/01/2022 3:34:03 PM By: Kalman Shan DO Previous Signature: 03/31/2022 4:00:04 PM Version By: Rhae Hammock RN Entered By: Kalman Shan on 03/31/2022 16:45:06 -------------------------------------------------------------------------------- Problem List Details Patient Name: Date of Service: [name], Alysiah [name]. 03/31/2022 2:45 PM Medical Record Number: [id number] Patient Account Number: [id number] Date of Birth/Sex: Treating RN: [birth year] (82 y.o. 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 03/31/2022 2:45 PM Medical Record Number: [id number] Patient Account Number: [id number] Date of Birth/Sex: Treating RN: 12-27-1940 (82 y.o. F) Primary Care Provider: [name] Other Clinician: [name] ([id number]) [id number] Page 4 of 7 Referring Provider: Treating Provider/Extender: Valda Lamb Weeks in Treatment: 23 Subjective Chief Complaint Information obtained from Patient Right ankle ulcer History of Present Illness (HPI) 10-21-2021 upon evaluation today patient appears to be doing poorly in regard to the wound over the medial aspect of her right ankle. This is an area that previously she has had trouble with previously. I have actually seen her in [place] about a year ago but this was on the left ankle at that time. 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 This is also the typical spot for her as it is a very difficult area to compress. Nonetheless previously we have been able to  get this under control using her compression socks as well as Xeroform gauze although I am not sure that is can be the best thing for her at this time we discussed that as we proceed. 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 Nonetheless I do think that we could potentially try some oral steroids, prednisone, to see if we can help calm this down. She is not opposed to this at all. In fact she would like to do anything she can to try to get better she tells me the Achilles area has been very painful at times for her. I really do not see any signs of infection right now but that symptomatic = as well. 10/18; patient with wounds looks a lot better today measuring smaller. She went to her grandsons wedding this weekend did not wear compression wraps. She completed her course of prednisone 50 mg we have been using TCA and Hydrofera Blue on the wound area. 11-11-2021 upon evaluation today patient appears to be doing well currently in regard to her wound in fact she is showing signs of good granulation and epithelization at this point and I am very pleased with where we stand currently. I do not see any signs of infection locally or systemically which is great news. No fevers, chills, nausea, vomiting, or diarrhea. I do believe that the anti-inflammatories did do well for her. 11-18-2021 upon evaluation today patient's wound is actually showing signs of excellent improvement and very pleased with where we were seeing this improvement thus far. I do not see any evidence of active infection locally or systemically which is great news and overall I do believe we are on the right track here. 11/8; patient comes in complaining of marked pain in this area. She takes Tylenol for the discomfort. Unfortunately we are not making much progress in the condition of this wound still slough covered. 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 With that being said there does not appear to be any signs of infection which is good news but the wound does not appear to be quite as healthy as what I saw the last time I saw her in 2 weeks and looking back at pictures I really feel like that there were very small compared to where we started there was a little bit of discoloration of the base of the wound that was not there last week. Some of this was probably just due to bleeding which is understandable from and following debridement but at the same time I do believe that nonetheless she still would benefit from the compression wrapping currently. She is definitely more swollen today than she was previous and she realizes this as well. 12-09-2021 upon evaluation today patient appears to be doing a little better in regard to her wound compared to last time although size wise is not much different it does seem to be improving with regard to the  necrotic tissue that is turning more yellow and loosening up which is at least good news. With that being said I do believe that the patient would benefit from likely switching to Santyl this is good to me and we cannot wrap her and she is getting need to actually change this more frequently at home using her compression sock but I think that it may be a better way to go at the moment to see if we can get this cleaned up she is in agreement with that plan. If we get to where we can use collagen then we will consider going back to the wrap. 12-23-2021 upon evaluation today patient's wound does have some necrotic tissue noted at this point and we are going to need and clean this away today. I discussed that with her. With that being said fortunately there does not appear to be any signs of infection locally or systemically  which is great news and overall I am extremely pleased in that regard. Her husband is changing this daily and that does seem to be benefiting her at this point. The wound is a bit smaller today compared to previous evaluation. 01-06-2022 upon evaluation today patient unfortunately appears to be doing worse in regard to her wound. Actually got a call from her yesterday in [place] because she told me that her foot and ankle area was doing much worse with increased pain. Fortunately I do not see any signs of infection systemically though locally I do's definitely see signs of cellulitis. I discussed that with her today. Nonetheless I do believe that she would benefit from continuation of the antibiotics which I placed her on yesterday. This includes the Bactrim DS although we may need to add something different depend on what the results of the culture come back showing. We are going to do a PCR culture today. Subsequently I am also going to go ahead and give the patient gentamicin cream which I am going to send into the pharmacy for her at this point. 01/13/2022: The culture that was taken last week returned positive for Pseudomonas, which certainly would not be covered by the Bactrim that was prescribed. She is having more pain in the wound and continues to have blue-green drainage on her dressing. There is substantial slough within the wound. 01-20-2022 upon evaluation today patient appears to be doing well currently in regard to the her wound. This looks much better than last time I saw her. Fortunately she did get on the right antibiotic had to be switched to Levaquin actually came back positive that her culture showed Pseudomonas only. Subsequently since being on the Levaquin and the wound has gotten much better she still has some burning but is not nearly as bad as what it was previous. 01-27-2022 upon evaluation today patient appears to be doing pretty well in regard to her wound compared to where  things were previous. Fortunately I do not see any evidence of infection locally nor systemically which is great news and overall I do believe that we are headed in the right direction. Overall the patient's wound seems to be greatly improved. 02-03-2022 upon evaluation today patient's wound is actually showing signs of improvement which is great news. Fortunately I do not see any evidence of worsening overall which is excellent and I do think she is headed in the appropriate direction. She is still having unfortunately discomfort but again I think this is definitely headed in the proper direction. 02-10-2022 upon evaluation today patient's wound  is actually showing signs of being smaller and measuring better. Fortunately I do not see any evidence of infection and I was able to get the necrotic surface of the wound without any additional sharp debridement today. She actually tolerated this with minimal discomfort compared to what she was previously noted. Overall I am actually very pleased with where we stand currently. 02-17-2022 upon evaluation today patient actually appears to be making some progress here in regard to her leg although this is still not as small as we like to see it is not as deep as it has been either. Fortunately I do not see any evidence of active infection locally nor systemically which is great news. No fevers, chills, nausea, vomiting, or diarrhea. 03-03-2022 upon evaluation today patient appears to be doing well currently in regard to her wounds. She has been tolerating the dressing changes without complication and fortunately there does not appear to be any signs of active infection locally nor systemically at this point. No fevers, chills, nausea, vomiting, or diarrhea. I think she is definitely ready for Apligraf application today. [name], [name] ([id number]) [id number] Page 5 of 7 03-10-2022 upon evaluation today patient appears to be doing well  currently in regard to her wound. The Apligraf does seem to be of benefit for her and the wound is measuring smaller which is great news. Fortunately I do not see any signs of active infection locally nor systemically which is great news. No fevers, chills, nausea, vomiting, or diarrhea. 03-17-2022 upon evaluation today patient appears to be doing well currently in regard to her wound. She has been tolerating the dressing changes without complication. Fortunately I do not see any signs of active infection locally nor systemically which is great news. No fevers, chills, nausea, vomiting, or diarrhea. 3/6; we have been treating a wound on the right medial ankle with Apligraf. Upon examination today there is no open wound however the area is very fragile still certainly enough to dress with Xeroform and a wrap. But I do not think he requires Apligraf. She has a stocking and waiting which is new 3/13; patient presents for follow-up. We have been using Xeroform under compression wrap. Her wound has healed to the right ankle. Prior to this she has been using Apligraf to help with wound closure. She has her compression stockings with her. Patient History Information obtained from Patient, Chart. Family History Unknown History. Social History Never smoker, Marital Status - Married, Alcohol Use - Never, Drug Use - No History, Caffeine Use - Moderate. Medical History Eyes Denies history of Cataracts, Glaucoma, Optic Neuritis Cardiovascular Patient has history of Peripheral Venous Disease Medical A Surgical History Notes nd Cardiovascular hyperlipidemia Endocrine hypothyroidism Objective Constitutional respirations regular, non-labored and within target range for patient.. Vitals Time Taken: 2:08 PM, Temperature: 98.7 F, Pulse: 74 bpm, Respiratory Rate: 17 breaths/min, Blood Pressure: 176/74 mmHg. Cardiovascular 2+ dorsalis pedis/posterior tibialis pulses. Psychiatric pleasant and  cooperative. General Notes: [name] the right medial ankle there is epithelization to the previous wound site. Good edema control. No signs of surrounding infection. 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 Her wound is healed. I recommended she wear her compression stockings daily. She may follow-up as needed. Plan Discharge From [facility] Services: TRANE, YUSKO ([id number]) [id number] Page 6 of  7 Discharge from [place] 1. Discharge from clinic due to closed wound 2. Follow-up as needed 3. Daily compression stockings Electronic Signature(s) Signed: 04/01/2022 3:34:03 PM By: Kalman Shan DO Entered By: Kalman Shan on 03/31/2022 16:46:16 -------------------------------------------------------------------------------- HxROS Details Patient Name: Date of Service: [name], [name]. 03/31/2022 2:45 PM Medical Record Number: [id number] Patient Account Number: [id number] Date of Birth/Sex: Treating RN: [birth year] (82 y.o. F) Primary Care Provider: [name] Other Clinician: Referring Provider: Treating Provider/Extender: Valda Lamb Weeks in Treatment: 23 Information Obtained From Patient Chart Eyes Medical History: Negative for: Cataracts; Glaucoma; Optic Neuritis Cardiovascular Medical History: Positive for: Peripheral Venous Disease Past Medical History Notes: hyperlipidemia Endocrine Medical History: Past Medical History Notes: hypothyroidism Immunizations Pneumococcal Vaccine: Received Pneumococcal Vaccination: Yes Received Pneumococcal Vaccination On or After 60th Birthday: Yes Implantable Devices None Family and Social  History Unknown History: Yes; Never smoker; Marital Status - Married; Alcohol Use: Never; Drug Use: No History; Caffeine Use: Moderate; Financial Concerns: No; Food, Clothing or Shelter Needs: No; Support System Lacking: No; Transportation Concerns: No Electronic Signature(s) Signed: 04/01/2022 3:34:03 PM By: Kalman Shan DO Entered By: Kalman Shan on 03/31/2022 16:44:39 -------------------------------------------------------------------------------- SuperBill Details Patient Name: Date of Service: [name], Leronda [name]. 03/31/2022 Medical Record Number: [id number] Patient Account Number: [id number] Date of Birth/Sex: Treating RN: July 20, 1940 (82 y.o. [name], [name] Primary Care Provider: [name] Other Clinician: Referring Provider: Treating Provider/Extender: Valda Lamb Weeks in Treatment: [address], [place] [name] ([id number]) [id number] Page 7 of 7 Diagnosis Coding ICD-10 Codes Code Description [id number] 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 I87.331 Chronic venous hypertension (idiopathic) with ulcer and inflammation of right lower extremity Facility Procedures : CPT4 Code: [id number] Description: [id number] - WOUND CARE VISIT-LEV 3 EST PT Modifier: Quantity: 1 Physician Procedures : CPT4 Code Description Modifier [id number] - WC PHYS LEVEL 3 - EST PT ICD-10 Diagnosis Description [id number] 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

## 2022-04-28 DIAGNOSIS — C44319 Basal cell carcinoma of skin of other parts of face: Secondary | ICD-10-CM | POA: Diagnosis not present

## 2022-05-10 ENCOUNTER — Encounter: Payer: Self-pay | Admitting: Family Medicine

## 2022-05-10 ENCOUNTER — Ambulatory Visit (INDEPENDENT_AMBULATORY_CARE_PROVIDER_SITE_OTHER): Payer: Medicare Other | Admitting: Family Medicine

## 2022-05-10 VITALS — BP 120/76 | HR 79 | Temp 98.0°F | Ht 68.0 in | Wt 211.0 lb

## 2022-05-10 DIAGNOSIS — I10 Essential (primary) hypertension: Secondary | ICD-10-CM

## 2022-05-10 DIAGNOSIS — F419 Anxiety disorder, unspecified: Secondary | ICD-10-CM

## 2022-05-10 DIAGNOSIS — G4700 Insomnia, unspecified: Secondary | ICD-10-CM | POA: Diagnosis not present

## 2022-05-10 MED ORDER — ATENOLOL 50 MG PO TABS: 25.0000 mg | ORAL_TABLET | Freq: Every day | ORAL | Status: DC

## 2022-05-10 NOTE — Patient Instructions (Addendum)
Check your meds at home and see if you are still taking belsomra. Either way, let me know.   Take care.  Glad to see you. See if you feel better with taking  of atenolol- 1/2 tab of .   Update me as needed.

## 2022-05-10 NOTE — Progress Notes (Signed)
Sleep "pretty good now".  She thought she wasn't taking belsomra and I asked her to check on that.  She is sleeping through the night now.    Occ dizzy during the day, meaning lightheaded.  Happens right after standing.  Brief.  No sx supine or with rolling over.  No syncope.    Mood d/w pt.  Anxiety is better.  She had some improvement with lexapro. She still thinks about her daughter, as expected.  Discussed.  Rare use of valium.   Meds, vitals, and allergies reviewed.   ROS: Per HPI unless specifically indicated in ROS section   GEN: nad, alert and oriented, she appears less anxious than at previous visits. HEENT: mucous membranes moist NECK: supple w/o LA CV: rrr.   PULM: ctab, no inc wob ABD: soft, +bs EXT: no edema SKIN: well-perfused

## 2022-05-11 ENCOUNTER — Encounter: Payer: Self-pay | Admitting: Family Medicine

## 2022-05-11 DIAGNOSIS — L708 Other acne: Secondary | ICD-10-CM | POA: Diagnosis not present

## 2022-05-12 NOTE — Assessment & Plan Note (Signed)
She may be overtreated for hypertension and discussed decreasing atenolol down to 25 mg a day, down from 50 mg.  She can see if she feels better and then update me as needed.

## 2022-05-12 NOTE — Assessment & Plan Note (Signed)
I asked her to verify her Belsomra use and then update me.  She is sleeping through the night now.  Overall insomnia is better.

## 2022-05-12 NOTE — Assessment & Plan Note (Signed)
Overall improved from prior.  Will continue Lexapro as is.  She can update me as needed.

## 2022-05-13 ENCOUNTER — Telehealth: Payer: Self-pay | Admitting: Family Medicine

## 2022-05-13 NOTE — Telephone Encounter (Signed)
If she can manage with ibuprofen (taken with food), then I would continue with that.  She could add on tylenol with the ibuprofen if needed.  Her last Cr was okay, so it should be okay to use ibuprofen.  Thanks.

## 2022-05-13 NOTE — Telephone Encounter (Signed)
Lvm asking pt to call back.  Need to relay Dr. Duncan's message.  

## 2022-05-13 NOTE — Telephone Encounter (Signed)
Please triage patient about her back pain.  Thanks.   

## 2022-05-13 NOTE — Telephone Encounter (Signed)
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 Pt is taking motrin 800 mg in AM and PM with food and pt said motrin helps the back pain but pt is afraid will bother her stomach and wants to know what Dr Para March suggest. Pt was seen 05/10/22 but pt forgot to mention to DR Para March.pt said when up moving around lower back pain is sharp at pain level of 6 - 7. If pt sitting no pain to pain level of 2.pt does not have burning or pain upon urination; no frequency of urine; no fever and no abd pain. Offered pt an appt to be seen but pt said preparing for pts husband to have knee surgery and pt will cb to schedule appt when she can. Pt wants Dr Armanda Heritage opinion of what to take for the lower back pain sent by my chart. [employer].

## 2022-05-14 NOTE — Telephone Encounter (Signed)
Left message to return call to our office.  

## 2022-05-17 MED ORDER — IBUPROFEN 800 MG PO TABS: ORAL_TABLET | ORAL | 1 refills | Status: DC

## 2022-05-17 NOTE — Addendum Note (Signed)
Addended by: Joaquim Nam on: 05/17/2022 11:08 PM   Modules accepted: Orders

## 2022-05-17 NOTE — Telephone Encounter (Signed)
Sent. Thanks.   

## 2022-05-17 NOTE — Telephone Encounter (Signed)
Called and advised patients husband he states she does well with ibprofuen and that she currently needs a refill on her 800 mg tablets.  Last refill 09/26/2020 #180 w/ 1 refill

## 2022-05-31 DIAGNOSIS — H353221 Exudative age-related macular degeneration, left eye, with active choroidal neovascularization: Secondary | ICD-10-CM | POA: Diagnosis not present

## 2022-05-31 DIAGNOSIS — Z961 Presence of intraocular lens: Secondary | ICD-10-CM | POA: Diagnosis not present

## 2022-05-31 DIAGNOSIS — H353110 Nonexudative age-related macular degeneration, right eye, stage unspecified: Secondary | ICD-10-CM | POA: Diagnosis not present

## 2022-06-15 IMAGING — CR DG CHEST 2V
2 series · 2 of 2 positions shown · non-contrast
Comparison: Chest radiographs 01/18/2013 and earlier. Cervical
spine CT 01/18/2013.

CLINICAL DATA: 79-year-old female with chest pain, left side back
pain and flank pain.

EXAM:
CHEST - 2 VIEW

[chest pa]
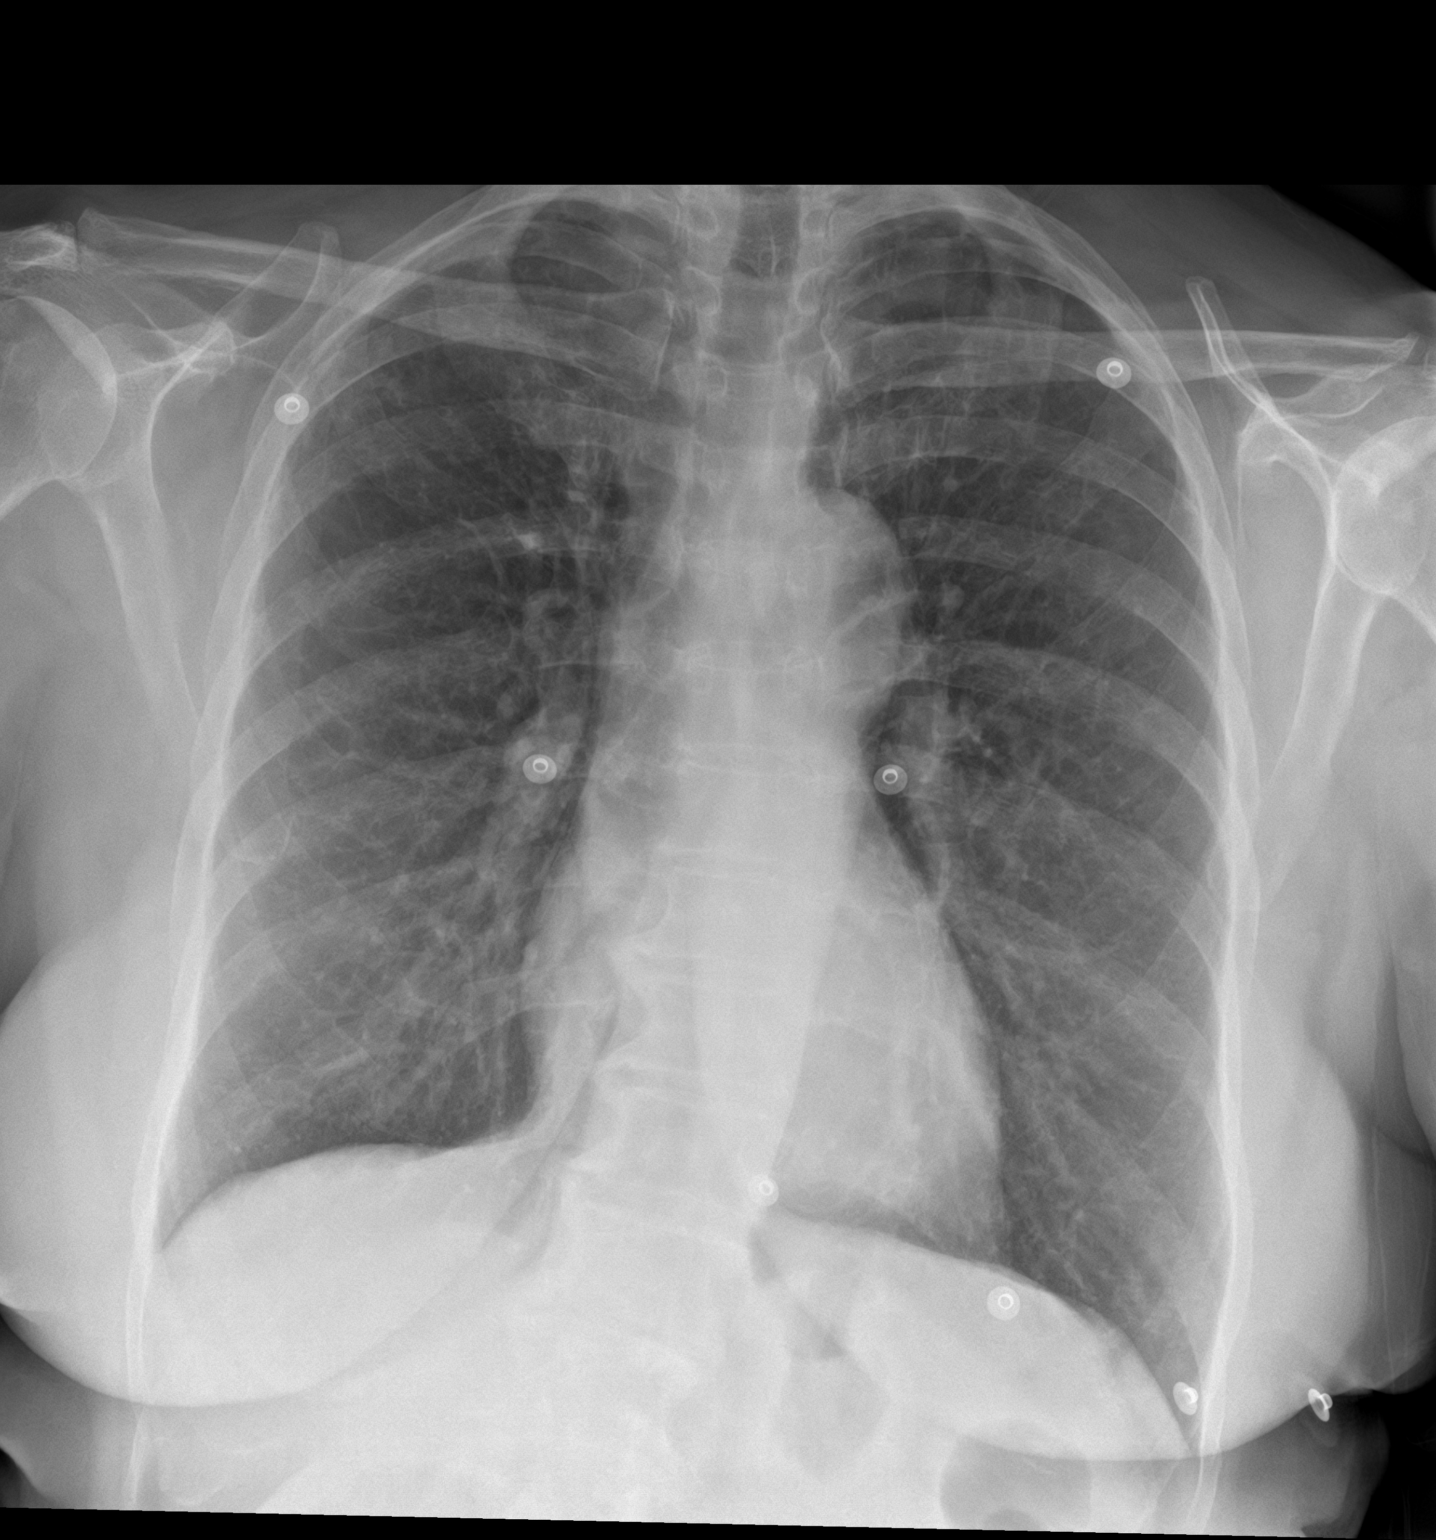

[chest lat]
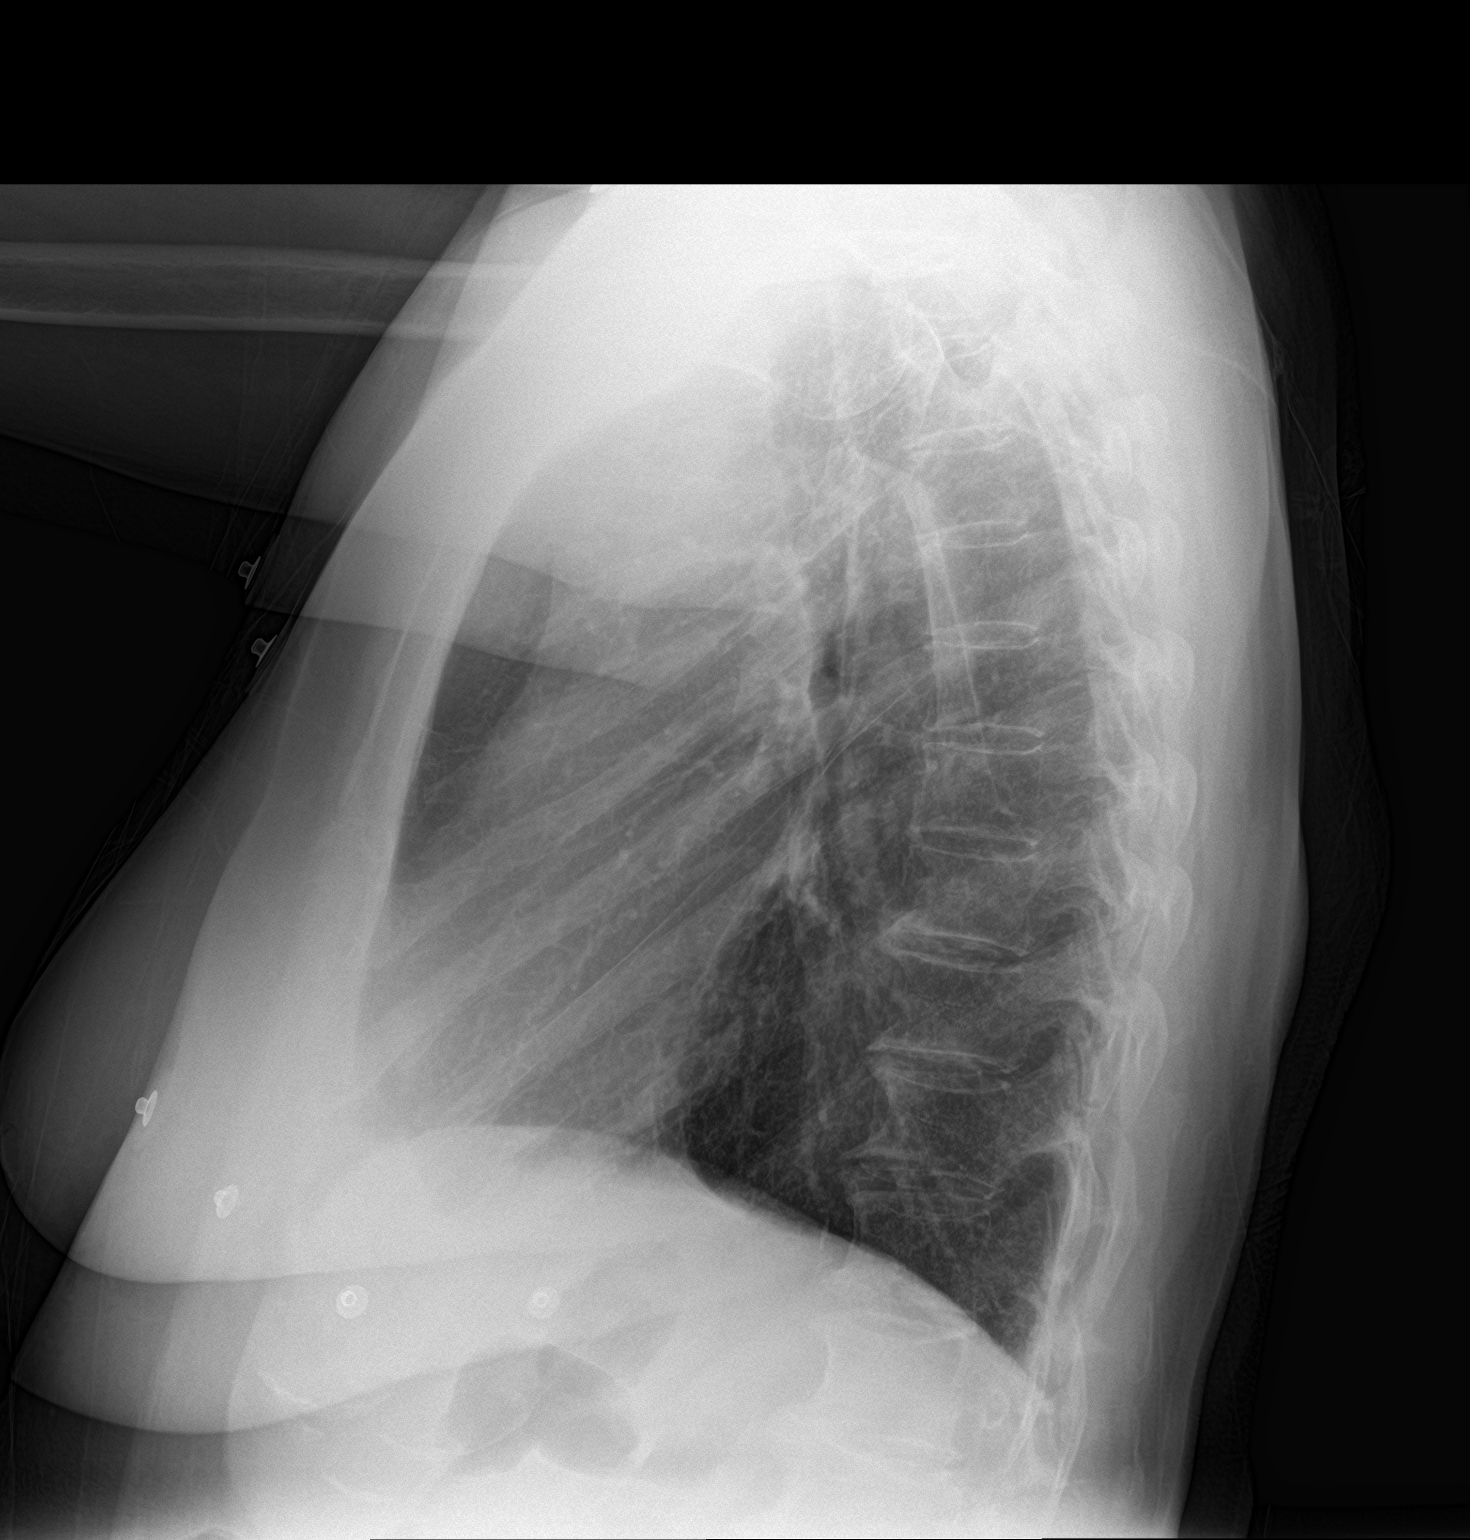

[2 of 2 positions shown; findings below may reference images not displayed]

FINDINGS: Stable lung volumes since 1655. Normal cardiac size and mediastinal
contours. Mild mass effect on the right trachea at the thoracic
inlet is new since 1655, with right thyroid enlargement demonstrated
on 7766 CT. Otherwise negative tracheal air column.

No pneumothorax, pulmonary edema, pleural effusion or confluent
pulmonary opacity. Mild increased interstitial markings in both
lungs appear stable since 7766.

No acute osseous abnormality identified. Negative visible bowel gas
pattern.
IMPRESSION: No acute cardiopulmonary abnormality.

## 2022-06-21 ENCOUNTER — Other Ambulatory Visit: Payer: Self-pay | Admitting: Family Medicine

## 2022-06-21 NOTE — Telephone Encounter (Signed)
Refill request for GABAPENTIN 300 MG CAPSULE   LOV - 05/10/22 Next OV - 10/25/22 Last refill - 10/20/21 #150/5

## 2022-06-28 ENCOUNTER — Ambulatory Visit (INDEPENDENT_AMBULATORY_CARE_PROVIDER_SITE_OTHER): Payer: Medicare Other | Admitting: Nurse Practitioner

## 2022-06-28 ENCOUNTER — Encounter (INDEPENDENT_AMBULATORY_CARE_PROVIDER_SITE_OTHER): Payer: Self-pay | Admitting: Nurse Practitioner

## 2022-06-28 VITALS — BP 141/67 | HR 67 | Resp 18 | Ht 68.0 in | Wt 209.8 lb

## 2022-06-28 DIAGNOSIS — I872 Venous insufficiency (chronic) (peripheral): Secondary | ICD-10-CM

## 2022-06-28 DIAGNOSIS — I10 Essential (primary) hypertension: Secondary | ICD-10-CM

## 2022-06-28 DIAGNOSIS — E782 Mixed hyperlipidemia: Secondary | ICD-10-CM | POA: Diagnosis not present

## 2022-06-28 NOTE — Progress Notes (Signed)
Subjective:    Patient ID: [name], female    DOB: 22-Jun-1940, 82 y.o.   MRN: [id number] Chief Complaint  Patient presents with   Follow-up    Having pain from harden pain in groin area wanting to be squeezed in    [name] is an 82 year old female who presents today for evaluation for concern about possible blood clots.  She notes that she had a pain in her groin that was like a charley horse.  She took mustard and her husband massaged her leg and the pain subsided.  She has not had any continuing issues.  She denies any significant swelling throughout the leg.  She denies any redness or significant tenderness.  She noted that she had some additional little veins in her lower leg and just wanted to be certain that she did not have any sort of clot given the recent current that she had.  The patient wears compression socks on a regular basis.  She has been doing this since she had treatment of her varicosities nearly 3 years ago.    Review of Systems  Cardiovascular:  Negative for leg swelling.  All other systems reviewed and are negative.      Objective:   Physical Exam Vitals reviewed.  HENT:     Head: Normocephalic.  Cardiovascular:     Rate and Rhythm: Normal rate.     Pulses:          Dorsalis pedis pulses are 1+ on the right side and 1+ on the left side.       Posterior tibial pulses are 1+ on the right side and 1+ on the left side.  Pulmonary:     Effort: Pulmonary effort is normal.  Musculoskeletal:        General: No tenderness.  Skin:    General: Skin is warm and dry.     Comments: Notable varicosities on the right leg but nontender and not firm  Neurological:     Mental Status: She is alert and oriented to person, place, and time.  Psychiatric:        Mood and Affect: Mood normal.        Behavior: Behavior normal.        Thought Content: Thought content normal.        Judgment: Judgment normal.     BP (!) 141/67 (BP Location: Right Arm)    Pulse 67   Resp 18   Ht 5\' 8"  (1.727 m)   Wt 209 lb 12.8 oz (95.2 kg)   BMI 31.90 kg/m   Past Medical History:  Diagnosis Date   Back pain    Endometriosis    HLD (hyperlipidemia)    Hypothyroidism    Migraine    Venous stasis ulcer (HCC) 11/1997   Wound care center, [place]    Social History   Socioeconomic History   Marital status: Married    Spouse name: Not on file   Number of children: 2   Years of education: Not on file   Highest education level: 12th grade  Occupational History   Occupation: Retired    Associate Professor: RETIRED  Tobacco Use   Smoking status: Never   Smokeless tobacco: Never  Vaping Use   Vaping Use: Never used  Substance and Sexual Activity   Alcohol use: No    Alcohol/week: 0.0 standard drinks of alcohol   Drug use: No   Sexual activity: Not on file  Other Topics Concern  Not on file  Social History Narrative   Lives with husband   Would desire CPR but would not want to be on life support for prolonged period of time if futile.   Social Determinants of Health   Financial Resource Strain: Low Risk  (05/09/2022)   Overall Financial Resource Strain (CARDIA)    Difficulty of Paying Living Expenses: Not hard at all  Food Insecurity: No Food Insecurity (05/09/2022)   Hunger Vital Sign    Worried About Running Out of Food in the Last Year: Never true    Ran Out of Food in the Last Year: Never true  Transportation Needs: No Transportation Needs (05/09/2022)   PRAPARE - Administrator, Civil Service (Medical): No    Lack of Transportation (Non-Medical): No  Physical Activity: Insufficiently Active (05/09/2022)   Exercise Vital Sign    Days of Exercise per Week: 5 days    Minutes of Exercise per Session: 20 min  Stress: No Stress Concern Present (05/09/2022)   [employer] of Occupational Health - Occupational Stress Questionnaire    Feeling of Stress : Not at all  Social Connections: Unknown (05/09/2022)   Social Connection and  Isolation Panel [NHANES]    Frequency of Communication with Friends and Family: Patient declined    Frequency of Social Gatherings with Friends and Family: Patient declined    Attends Religious Services: More than 4 times per year    Active Member of Clubs or Organizations: No    Attends Banker Meetings: Not on file    Marital Status: Married  Catering manager Violence: Not At Risk (10/09/2018)   Humiliation, Afraid, Rape, and Kick questionnaire    Fear of Current or Ex-Partner: No    Emotionally Abused: No    Physically Abused: No    Sexually Abused: No    Past Surgical History:  Procedure Laterality Date   ABDOMINAL HYSTERECTOMY     1 1/2 ovaries gone, second to endometriosis   APPENDECTOMY     CYSTOSCOPY     HEMORRHOID SURGERY     HERNIA REPAIR     ROTATOR CUFF REPAIR     THYROIDECTOMY      Family History  Problem Relation Age of Onset   Stroke Mother    Goiter Mother    Stomach cancer Sister    Heart attack Father    Heart disease Father    Heart attack Brother    Kidney disease Brother    Breast cancer Daughter    Colon cancer Neg Hx    Esophageal cancer Neg Hx     Allergies  Allergen Reactions   Codeine     REACTION: NAUSEA AND VOMITING   Tramadol     Vomiting, LOC       Latest Ref Rng & Units 10/12/2021    3:29 PM 04/03/2021    9:16 AM 10/16/2020    9:16 AM  CBC  WBC 4.0 - 10.5 K/uL 7.5  5.6  6.4   Hemoglobin 12.0 - 15.0 g/dL 16.1  09.6  04.5   Hematocrit 36.0 - 46.0 % 38.2  38.0  37.5   Platelets 150.0 - 400.0 K/uL 267.0  242.0  257.0       CMP     Component Value Date/Time   NA 139 10/12/2021 1529   NA 138 08/22/2011 1737   K 4.8 10/12/2021 1529   K 3.9 08/22/2011 1737   CL 103 10/12/2021 1529   CL 104 08/22/2011 1737  CO2 30 10/12/2021 1529   CO2 25 08/22/2011 1737   GLUCOSE 86 10/12/2021 1529   GLUCOSE 119 (H) 08/22/2011 1737   BUN 8 10/12/2021 1529   BUN 15 08/22/2011 1737   CREATININE 0.74 10/12/2021 1529    CREATININE 0.75 08/22/2011 1737   CALCIUM 9.3 10/12/2021 1529   CALCIUM 8.5 08/22/2011 1737   PROT 7.0 10/12/2021 1529   PROT 7.6 08/22/2011 1737   ALBUMIN 4.2 10/12/2021 1529   ALBUMIN 3.7 08/22/2011 1737   AST 33 10/12/2021 1529   AST 47 (H) 08/22/2011 1737   ALT 42 (H) 10/12/2021 1529   ALT 52 08/22/2011 1737   ALKPHOS 120 (H) 10/12/2021 1529   ALKPHOS 87 08/22/2011 1737   BILITOT 0.5 10/12/2021 1529   BILITOT 0.5 08/22/2011 1737   GFR 76.09 10/12/2021 1529   GFRNONAA >60 01/17/2020 0950   GFRNONAA >60 08/22/2011 1737     No results found.     Assessment & Plan:   1. Venous (peripheral) insufficiency I had a discussion about her concerns.  I discussed that with an ultrasound I cannot be 100% certain that there is no DVT but based on presentation I have a very low suspicion of DVT.  Based on physical exam there is no evidence of superficial thrombophlebitis.  I suspect that this was a random cramp which also may have been caused by her significant lower back issues.  Offered the patient an ultrasound but she did not feel that was necessary at this time as all her symptoms had subsided.  Patient will continue with conservative therapy of utilizing her medical grade compression stockings and elevation.  The patient will follow-up with [place] on an as-needed basis.  2. Primary hypertension Continue antihypertensive medications as already ordered, these medications have been reviewed and there are no changes at this time.  3. Mixed hyperlipidemia Continue statin as ordered and reviewed, no changes at this time   Current Outpatient Medications on File Prior to Visit  Medication Sig Dispense Refill   albuterol (VENTOLIN HFA) 108 (90 Base) MCG/ACT inhaler Inhale 1-2 puffs into the lungs every 8 (eight) hours as needed for wheezing (or for cough). Okay to fill with proair/ventolin/albuterol 8 g 1   atenolol (TENORMIN) 50 MG tablet Take 0.5 tablets (25 mg total) by mouth daily.      butalbital-acetaminophen-caffeine (FIORICET) 50-325-40 MG tablet TAKE ONE TABLET BY MOUTH TWICE A DAY AS NEEDED FOR FOR HEADACHE 60 tablet 0   Cholecalciferol (VITAMIN D) 50 MCG (2000 UT) CAPS Take 1 capsule (2,000 Units total) by mouth daily.     diazepam (VALIUM) 5 MG tablet TAKE 1/2 TO 1 TABLET BY MOUTH EVERY 12 HOURS AS NEEDED FOR ANXIETY 30 tablet 1   escitalopram (LEXAPRO) 5 MG tablet Take 1 tablet (5 mg total) by mouth daily. 90 tablet 3   FEROSUL 325 (65 Fe) MG tablet TAKE ONE TABLET BY MOUTH EVERY OTHER DAY 45 tablet 3   fluocinonide-emollient (LIDEX-E) 0.05 % cream APPLY TOPICALLY TO AFFECTED AREA(S) TWICE DAILY 60 g 2   gabapentin (NEURONTIN) 300 MG capsule TAKE ONE CAPSULE IN THE MORNING, TAKE ONE CAPSULE AT MIDDAY, AND TAKE THREE CAPSULES AT NIGHT 150 capsule 5   ibuprofen (ADVIL) 800 MG tablet TAKE ONE TABLET BY MOUTH EVERY 8 HOURS AS NEEDED FOR MODERATE PAIN.  TAKE WITH FOOD 180 tablet 1   levothyroxine (SYNTHROID) 100 MCG tablet TAKE ONE TABLET BY MOUTH DAILY EXCEPT 1/2 TABLET ON SUNDAYS 90 tablet 3   Multiple  Vitamin (MULTIVITAMIN) tablet Take 1 tablet by mouth daily.     omeprazole (PRILOSEC) 20 MG capsule Take 1 capsule (20 mg total) by mouth daily. 90 capsule 3   rOPINIRole (REQUIP) 2 MG tablet Take 1 tablet (2 mg total) by mouth in the morning and at bedtime. 180 tablet 3   simvastatin (ZOCOR) 40 MG tablet TAKE ONE TABLET BY MOUTH EVERY NIGHT AT BEDTIME 90 tablet 2   Suvorexant (BELSOMRA) 10 MG TABS Take 1 tablet (10 mg total) by mouth at bedtime as needed. 30 tablet    No current facility-administered medications on file prior to visit.    There are no Patient Instructions on file for this visit. No follow-ups on file.   Georgiana Spinner, NP

## 2022-08-03 ENCOUNTER — Telehealth: Payer: Self-pay | Admitting: Family Medicine

## 2022-08-03 MED ORDER — ROPINIROLE HCL 2 MG PO TABS: ORAL_TABLET | ORAL | Status: DC

## 2022-08-03 NOTE — Telephone Encounter (Signed)
Pt was having cramping at night and d/w pt about afternoon/night dose of requip and then update me as needed.

## 2022-08-09 DIAGNOSIS — H353221 Exudative age-related macular degeneration, left eye, with active choroidal neovascularization: Secondary | ICD-10-CM | POA: Diagnosis not present

## 2022-08-24 DIAGNOSIS — Z1231 Encounter for screening mammogram for malignant neoplasm of breast: Secondary | ICD-10-CM | POA: Diagnosis not present

## 2022-08-24 LAB — HM MAMMOGRAPHY

## 2022-08-27 ENCOUNTER — Encounter: Payer: Self-pay | Admitting: Family Medicine

## 2022-08-29 ENCOUNTER — Other Ambulatory Visit: Payer: Self-pay | Admitting: Family Medicine

## 2022-08-30 NOTE — Telephone Encounter (Signed)
Please get patient scheduled for OV and have her bring all of her meds/pill bottles.  Thanks.

## 2022-08-30 NOTE — Telephone Encounter (Signed)
App made for patient. Advised to bring meds on call and sent my chart to remind as well.

## 2022-09-07 ENCOUNTER — Ambulatory Visit (INDEPENDENT_AMBULATORY_CARE_PROVIDER_SITE_OTHER): Payer: Medicare Other | Admitting: Family Medicine

## 2022-09-07 ENCOUNTER — Encounter: Payer: Self-pay | Admitting: Family Medicine

## 2022-09-07 VITALS — BP 122/76 | HR 83 | Temp 97.8°F | Ht 68.0 in | Wt 208.0 lb

## 2022-09-07 DIAGNOSIS — G2581 Restless legs syndrome: Secondary | ICD-10-CM | POA: Diagnosis not present

## 2022-09-07 DIAGNOSIS — R232 Flushing: Secondary | ICD-10-CM | POA: Diagnosis not present

## 2022-09-07 DIAGNOSIS — E559 Vitamin D deficiency, unspecified: Secondary | ICD-10-CM | POA: Diagnosis not present

## 2022-09-07 DIAGNOSIS — E039 Hypothyroidism, unspecified: Secondary | ICD-10-CM

## 2022-09-07 DIAGNOSIS — E782 Mixed hyperlipidemia: Secondary | ICD-10-CM | POA: Diagnosis not present

## 2022-09-07 LAB — CBC WITH DIFFERENTIAL/PLATELET
Basophils Absolute: 0.1 10*3/uL (ref 0.0–0.1)
Basophils Relative: 1.4 % (ref 0.0–3.0)
Eosinophils Absolute: 0.2 10*3/uL (ref 0.0–0.7)
Eosinophils Relative: 4.2 % (ref 0.0–5.0)
HCT: 39.1 % (ref 36.0–46.0)
Hemoglobin: 12.9 g/dL (ref 12.0–15.0)
Lymphocytes Relative: 27.2 % (ref 12.0–46.0)
Lymphs Abs: 1.5 10*3/uL (ref 0.7–4.0)
MCHC: 33 g/dL (ref 30.0–36.0)
MCV: 98.2 fl (ref 78.0–100.0)
Monocytes Absolute: 0.6 10*3/uL (ref 0.1–1.0)
Monocytes Relative: 10.8 % (ref 3.0–12.0)
Neutro Abs: 3.2 10*3/uL (ref 1.4–7.7)
Neutrophils Relative %: 56.4 % (ref 43.0–77.0)
Platelets: 265 10*3/uL (ref 150.0–400.0)
RBC: 3.98 Mil/uL (ref 3.87–5.11)
RDW: 13.4 % (ref 11.5–15.5)
WBC: 5.7 10*3/uL (ref 4.0–10.5)

## 2022-09-07 LAB — IRON: Iron: 113 ug/dL (ref 42–145)

## 2022-09-07 LAB — LIPID PANEL
Cholesterol: 183 mg/dL (ref 0–200)
HDL: 42.9 mg/dL (ref >39.00)
NonHDL: 140.41
Total CHOL/HDL Ratio: 4
Triglycerides: 297 mg/dL — ABNORMAL HIGH (ref 0.0–149.0)
VLDL: 59.4 mg/dL — ABNORMAL HIGH (ref 0.0–40.0)

## 2022-09-07 LAB — COMPREHENSIVE METABOLIC PANEL
ALT: 32 U/L (ref 0–35)
AST: 34 U/L (ref 0–37)
Albumin: 4 g/dL (ref 3.5–5.2)
Alkaline Phosphatase: 107 U/L (ref 39–117)
BUN: 11 mg/dL (ref 6–23)
CO2: 29 mEq/L (ref 19–32)
Calcium: 8.8 mg/dL (ref 8.4–10.5)
Chloride: 105 mEq/L (ref 96–112)
Creatinine, Ser: 0.65 mg/dL (ref 0.40–1.20)
GFR: 82.28 mL/min (ref >60.00)
Glucose, Bld: 112 mg/dL — ABNORMAL HIGH (ref 70–99)
Potassium: 4.4 mEq/L (ref 3.5–5.1)
Sodium: 142 mEq/L (ref 135–145)
Total Bilirubin: 0.6 mg/dL (ref 0.2–1.2)
Total Protein: 6.6 g/dL (ref 6.0–8.3)

## 2022-09-07 LAB — VITAMIN D 25 HYDROXY (VIT D DEFICIENCY, FRACTURES): VITD: 37.13 ng/mL (ref 30.00–100.00)

## 2022-09-07 LAB — LDL CHOLESTEROL, DIRECT: Direct LDL: 98 mg/dL

## 2022-09-07 LAB — TSH: TSH: 0.93 u[IU]/mL (ref 0.35–5.50)

## 2022-09-07 MED ORDER — ROPINIROLE HCL 2 MG PO TABS: 2.0000 mg | ORAL_TABLET | Freq: Two times a day (BID) | ORAL | Status: DC

## 2022-09-07 MED ORDER — GABAPENTIN 300 MG PO CAPS: ORAL_CAPSULE | ORAL | Status: DC

## 2022-09-07 NOTE — Patient Instructions (Signed)
Check to make sure about taking lexapro.  Either way, let me know.  Go to the lab on the way out.   If you have mychart we'll likely use that to update you.    Take care.  Glad to see you.

## 2022-09-07 NOTE — Progress Notes (Unsigned)
Increase in hot flashes in the last month.  Med list udpated.  She may be/likely is off lexapro.  Discussed.  Symptoms can happen day/night.  No clear trigger.  Gets visibly sweats.  She cut the air conditioner down in the meantime.  No neck mass.  No dysphagia.  She is doing more recently since her husband had knee surgery.  Stressors d/w pt. She has trouble waiting to get something done at home- she wants to push through to complete tasks.    Discussed getting labs done for chronic conditions in anticipation of her upcoming yearly visit.  See notes on labs.  Meds, vitals, and allergies reviewed.   ROS: Per HPI unless specifically indicated in ROS section   GEN: nad, alert and oriented HEENT: ncat NECK: supple w/o LA CV: rrr. PULM: ctab, no inc wob ABD: soft, +bs EXT: no edema SKIN: well perfused.

## 2022-09-08 DIAGNOSIS — R232 Flushing: Secondary | ICD-10-CM | POA: Insufficient documentation

## 2022-09-08 NOTE — Assessment & Plan Note (Signed)
See notes on labs.  If she is not on Lexapro then restarting may help for both anxiety/social stressors and also potentially with hot flashes.  Will check her labs to look for reversible issues.  At this point still okay for outpatient follow-up. 30 minutes were devoted to patient care in this encounter (this includes time spent reviewing the patient's file/history, interviewing and examining the patient, counseling/reviewing plan with patient).

## 2022-09-09 ENCOUNTER — Encounter: Payer: Self-pay | Admitting: Family Medicine

## 2022-09-09 MED ORDER — ESCITALOPRAM OXALATE 5 MG PO TABS: 5.0000 mg | ORAL_TABLET | Freq: Every day | ORAL | 0 refills | Status: DC

## 2022-09-10 ENCOUNTER — Telehealth: Payer: Self-pay

## 2022-09-10 ENCOUNTER — Telehealth: Payer: Self-pay | Admitting: Family Medicine

## 2022-09-10 MED ORDER — ESCITALOPRAM OXALATE 5 MG PO TABS: 5.0000 mg | ORAL_TABLET | Freq: Every day | ORAL | 0 refills | Status: DC

## 2022-09-10 NOTE — Addendum Note (Signed)
Addended by: Wendie Simmer B on: 09/10/2022 04:21 PM   Modules accepted: Orders

## 2022-09-10 NOTE — Telephone Encounter (Signed)
Patient called returning a call would like a call back.

## 2022-09-10 NOTE — Telephone Encounter (Signed)
Patient's husband notified of results; okay per DPR. Refill for lexapro sent to [facility] as requested.

## 2022-09-10 NOTE — Telephone Encounter (Addendum)
Left voice message asking patient to give office a call for recent lab results. Will try calling again.  ----- Message from Crawford Givens sent at 09/08/2022 10:54 PM EDT ----- Please update patient.  Triglycerides are elevated but she has a history of that in the past.  Sugar was mildly elevated but she was not fasting.  Liver tests are normal.  Blood counts are fine.  Thyroid test is normal.  Iron and vitamin D are normal.  Let me know if she has been taking Lexapro.  If she has not been taking it then I would restart 5 mg/day-ie taken every day-and see if that helps with hot flashes.  If she is already taking 5 mg/day then let me know so we can see about sending a new prescription to increase her to 10 mg.  Thanks.

## 2022-09-19 ENCOUNTER — Other Ambulatory Visit: Payer: Self-pay | Admitting: Family Medicine

## 2022-09-22 NOTE — Telephone Encounter (Signed)
Refill request for diazepam (VALIUM) 5 MG tablet   LOV - 09/07/22 Next OV - 10/25/22 Last refill - 02/17/22 #30/1

## 2022-10-10 ENCOUNTER — Other Ambulatory Visit: Payer: Self-pay | Admitting: Family Medicine

## 2022-10-17 ENCOUNTER — Other Ambulatory Visit: Payer: Self-pay | Admitting: Family Medicine

## 2022-10-17 DIAGNOSIS — E782 Mixed hyperlipidemia: Secondary | ICD-10-CM

## 2022-10-17 DIAGNOSIS — R739 Hyperglycemia, unspecified: Secondary | ICD-10-CM

## 2022-10-18 ENCOUNTER — Other Ambulatory Visit: Payer: Medicare Other

## 2022-10-18 ENCOUNTER — Other Ambulatory Visit (INDEPENDENT_AMBULATORY_CARE_PROVIDER_SITE_OTHER): Payer: Medicare Other

## 2022-10-18 DIAGNOSIS — R739 Hyperglycemia, unspecified: Secondary | ICD-10-CM

## 2022-10-18 DIAGNOSIS — E782 Mixed hyperlipidemia: Secondary | ICD-10-CM | POA: Diagnosis not present

## 2022-10-18 LAB — LIPID PANEL
Cholesterol: 180 mg/dL (ref 0–200)
HDL: 49.2 mg/dL (ref >39.00)
LDL Cholesterol: 96 mg/dL (ref 0–99)
NonHDL: 130.45
Total CHOL/HDL Ratio: 4
Triglycerides: 170 mg/dL — ABNORMAL HIGH (ref 0.0–149.0)
VLDL: 34 mg/dL (ref 0.0–40.0)

## 2022-10-18 LAB — HEMOGLOBIN A1C: Hgb A1c MFr Bld: 6.1 % (ref 4.6–6.5)

## 2022-10-25 ENCOUNTER — Encounter: Payer: Self-pay | Admitting: Family Medicine

## 2022-10-25 ENCOUNTER — Ambulatory Visit: Payer: Medicare Other | Admitting: Family Medicine

## 2022-10-25 VITALS — BP 124/80 | HR 79 | Temp 97.9°F | Ht 68.0 in | Wt 207.0 lb

## 2022-10-25 DIAGNOSIS — E782 Mixed hyperlipidemia: Secondary | ICD-10-CM | POA: Diagnosis not present

## 2022-10-25 DIAGNOSIS — G2581 Restless legs syndrome: Secondary | ICD-10-CM

## 2022-10-25 DIAGNOSIS — E039 Hypothyroidism, unspecified: Secondary | ICD-10-CM | POA: Diagnosis not present

## 2022-10-25 DIAGNOSIS — Z7189 Other specified counseling: Secondary | ICD-10-CM

## 2022-10-25 DIAGNOSIS — G4700 Insomnia, unspecified: Secondary | ICD-10-CM

## 2022-10-25 DIAGNOSIS — Z0000 Encounter for general adult medical examination without abnormal findings: Secondary | ICD-10-CM

## 2022-10-25 DIAGNOSIS — F419 Anxiety disorder, unspecified: Secondary | ICD-10-CM | POA: Diagnosis not present

## 2022-10-25 DIAGNOSIS — Z23 Encounter for immunization: Secondary | ICD-10-CM

## 2022-10-25 MED ORDER — ATENOLOL 25 MG PO TABS: 25.0000 mg | ORAL_TABLET | Freq: Every day | ORAL | 3 refills | Status: DC

## 2022-10-25 MED ORDER — LEVOTHYROXINE SODIUM 100 MCG PO TABS: ORAL_TABLET | ORAL | 3 refills | Status: DC

## 2022-10-25 MED ORDER — ESCITALOPRAM OXALATE 5 MG PO TABS: 5.0000 mg | ORAL_TABLET | Freq: Every day | ORAL | 3 refills | Status: DC

## 2022-10-25 NOTE — Assessment & Plan Note (Signed)
Mood d/w pt.  On lexapro with rare use of BZD.   Continue as is.  Update me as needed.

## 2022-10-25 NOTE — Assessment & Plan Note (Signed)
Still on gabapentin and requip.  She is clearly better than prior.  No ADE on med.  Would continue both.  Labs d/w pt.

## 2022-10-25 NOTE — Assessment & Plan Note (Signed)
Continue simvastatin. Labs d/w pt.

## 2022-10-25 NOTE — Assessment & Plan Note (Signed)
Rare use of belsomra.  Sleeping well.  Continue as is.

## 2022-10-25 NOTE — Patient Instructions (Signed)
Don't change your meds for now except for taking the lower dose of atenolol that you don't have to split.  Take care.  Glad to see you. Flu shot today.

## 2022-10-25 NOTE — Progress Notes (Signed)
I have personally reviewed the Medicare Annual Wellness questionnaire and have noted 1. The patient's medical and social history 2. Their use of alcohol, tobacco or illicit drugs 3. Their current medications and supplements 4. The patient's functional ability including ADL's, fall risks, home safety risks and hearing or visual             impairment. 5. Diet and physical activities 6. Evidence for depression or mood disorders  The patients weight, height, BMI have been recorded in the chart and visual acuity is per eye clinic.  I have made referrals, counseling and provided education to the patient based review of the above and I have provided the pt with a written personalized care plan for preventive services.  Provider list updated- see scanned forms.  Routine anticipatory guidance given to patient.  See health maintenance. The possibility exists that previously documented standard health maintenance information may have been brought forward from a previous encounter into this note.  If needed, that same information has been updated to reflect the current situation based on today's encounter.    Flu 2024 Shingles prev done.  PNA prev done.  Tetanus 2015 RSV prev done.  Covid vaccine d/w pt.  Colon cancer screening not due. Age >34.  Breast cancer screening 2024 DXA 2020.  Advance directive- husband designated if patient were incapacitated.   Cognitive function addressed- see scanned forms- and if abnormal then additional documentation follows.   In addition to [facility], follow up visit for the below conditions:  She didn't have liver u/s done last year, d/w pt about getting that done. Reasonable to defer with normal LFTs and no FCNAVD, no jaundice.    She recently wents to [place] with family.    Elevated Cholesterol: Using medications without problems: yes Muscle aches: not having diffuse aches.   Diet compliance: d/w pt.  Exercise: d/w pt.  Labs d/w pt.     BLE controlled with support stockings.    RLS. Still on gabapentin and requip.  She is clearly better than prior.  No ADE on med.   Hypothyroidism.  No neck mass.  No dysphagia with food.  TSH wnl.  Compliant.  D/w pt.    Her hot flashes are better in the meantime.  Mood d/w pt.  On lexapro with rare use of BZD.  Mood improved.  Sig prev grief with loss of daughter.    Migraines.  Rare use of fioricet.  Used prn.  No ADE on med.   Rare use of belsomra.  Sleeping well.    PMH and SH reviewed  Meds, vitals, and allergies reviewed.   ROS: Per HPI.  Unless specifically indicated otherwise in HPI, the patient denies:  General: fever. Eyes: acute vision changes ENT: sore throat Cardiovascular: chest pain Respiratory: SOB GI: vomiting GU: dysuria Musculoskeletal: acute back pain Derm: acute rash Neuro: acute motor dysfunction Psych: worsening mood Endocrine: polydipsia Heme: bleeding Allergy: hayfever  GEN: nad, alert and oriented HEENT: ncat NECK: supple w/o LA CV: rrr. PULM: ctab, no inc wob ABD: soft, +bs EXT: no edema in BLE compression stockings.  SKIN: no acute rash

## 2022-10-25 NOTE — Assessment & Plan Note (Signed)
No neck mass.  No dysphagia with food.  TSH wnl.  Compliant.  D/w pt.   Continue levothyroxine as is.

## 2022-10-25 NOTE — Assessment & Plan Note (Signed)
Flu 2024 Shingles prev done.  PNA prev done.  Tetanus 2015 RSV prev done.  Covid vaccine d/w pt.  Colon cancer screening not due. Age >52.  Breast cancer screening 2024 DXA 2020.  Advance directive- husband designated if patient were incapacitated.   Cognitive function addressed- see scanned forms- and if abnormal then additional documentation follows.

## 2022-10-25 NOTE — Assessment & Plan Note (Signed)
Advance directive- husband designated if patient were incapacitated.  

## 2022-10-26 DIAGNOSIS — H353221 Exudative age-related macular degeneration, left eye, with active choroidal neovascularization: Secondary | ICD-10-CM | POA: Diagnosis not present

## 2022-11-29 ENCOUNTER — Other Ambulatory Visit: Payer: Self-pay | Admitting: Family Medicine

## 2022-12-19 ENCOUNTER — Other Ambulatory Visit: Payer: Self-pay | Admitting: Family Medicine

## 2022-12-21 NOTE — Telephone Encounter (Signed)
Last office visit: 10/25/22 Next office visit: nothing scheduled  Last refill: 09/07/22

## 2023-02-01 DIAGNOSIS — H353221 Exudative age-related macular degeneration, left eye, with active choroidal neovascularization: Secondary | ICD-10-CM | POA: Diagnosis not present

## 2023-03-20 ENCOUNTER — Other Ambulatory Visit: Payer: Self-pay | Admitting: Family Medicine

## 2023-03-21 DIAGNOSIS — L905 Scar conditions and fibrosis of skin: Secondary | ICD-10-CM | POA: Diagnosis not present

## 2023-03-21 DIAGNOSIS — D485 Neoplasm of uncertain behavior of skin: Secondary | ICD-10-CM | POA: Diagnosis not present

## 2023-03-21 DIAGNOSIS — C44319 Basal cell carcinoma of skin of other parts of face: Secondary | ICD-10-CM | POA: Diagnosis not present

## 2023-03-21 DIAGNOSIS — D1801 Hemangioma of skin and subcutaneous tissue: Secondary | ICD-10-CM | POA: Diagnosis not present

## 2023-03-21 DIAGNOSIS — D225 Melanocytic nevi of trunk: Secondary | ICD-10-CM | POA: Diagnosis not present

## 2023-03-21 DIAGNOSIS — L821 Other seborrheic keratosis: Secondary | ICD-10-CM | POA: Diagnosis not present

## 2023-03-21 DIAGNOSIS — Z85828 Personal history of other malignant neoplasm of skin: Secondary | ICD-10-CM | POA: Diagnosis not present

## 2023-04-05 DIAGNOSIS — H353132 Nonexudative age-related macular degeneration, bilateral, intermediate dry stage: Secondary | ICD-10-CM | POA: Diagnosis not present

## 2023-04-28 DIAGNOSIS — C44319 Basal cell carcinoma of skin of other parts of face: Secondary | ICD-10-CM | POA: Diagnosis not present

## 2023-04-29 ENCOUNTER — Encounter: Attending: Physician Assistant | Admitting: Physician Assistant

## 2023-04-29 DIAGNOSIS — I87393 Chronic venous hypertension (idiopathic) with other complications of bilateral lower extremity: Secondary | ICD-10-CM | POA: Insufficient documentation

## 2023-04-29 DIAGNOSIS — L950 Livedoid vasculitis: Secondary | ICD-10-CM | POA: Diagnosis not present

## 2023-04-29 DIAGNOSIS — I87303 Chronic venous hypertension (idiopathic) without complications of bilateral lower extremity: Secondary | ICD-10-CM | POA: Diagnosis not present

## 2023-05-02 DIAGNOSIS — H353221 Exudative age-related macular degeneration, left eye, with active choroidal neovascularization: Secondary | ICD-10-CM | POA: Diagnosis not present

## 2023-05-10 DIAGNOSIS — H353211 Exudative age-related macular degeneration, right eye, with active choroidal neovascularization: Secondary | ICD-10-CM | POA: Diagnosis not present

## 2023-05-10 DIAGNOSIS — H353221 Exudative age-related macular degeneration, left eye, with active choroidal neovascularization: Secondary | ICD-10-CM | POA: Diagnosis not present

## 2023-05-10 DIAGNOSIS — Z961 Presence of intraocular lens: Secondary | ICD-10-CM | POA: Diagnosis not present

## 2023-05-21 ENCOUNTER — Other Ambulatory Visit: Payer: Self-pay | Admitting: Family Medicine

## 2023-05-24 NOTE — Telephone Encounter (Signed)
 Sent. Thanks.

## 2023-06-04 ENCOUNTER — Other Ambulatory Visit: Payer: Self-pay | Admitting: Family Medicine

## 2023-06-07 ENCOUNTER — Encounter (INDEPENDENT_AMBULATORY_CARE_PROVIDER_SITE_OTHER): Payer: Self-pay

## 2023-06-14 DIAGNOSIS — H353211 Exudative age-related macular degeneration, right eye, with active choroidal neovascularization: Secondary | ICD-10-CM | POA: Diagnosis not present

## 2023-07-10 ENCOUNTER — Other Ambulatory Visit: Payer: Self-pay | Admitting: Family Medicine

## 2023-07-25 DIAGNOSIS — H353211 Exudative age-related macular degeneration, right eye, with active choroidal neovascularization: Secondary | ICD-10-CM | POA: Diagnosis not present

## 2023-08-12 DIAGNOSIS — H353221 Exudative age-related macular degeneration, left eye, with active choroidal neovascularization: Secondary | ICD-10-CM | POA: Diagnosis not present

## 2023-08-16 ENCOUNTER — Encounter: Attending: Physician Assistant | Admitting: Physician Assistant

## 2023-08-16 DIAGNOSIS — L97312 Non-pressure chronic ulcer of right ankle with fat layer exposed: Secondary | ICD-10-CM | POA: Diagnosis not present

## 2023-08-16 DIAGNOSIS — L950 Livedoid vasculitis: Secondary | ICD-10-CM | POA: Diagnosis not present

## 2023-08-16 DIAGNOSIS — I87331 Chronic venous hypertension (idiopathic) with ulcer and inflammation of right lower extremity: Secondary | ICD-10-CM | POA: Diagnosis not present

## 2023-08-18 ENCOUNTER — Telehealth: Payer: Self-pay | Admitting: Family Medicine

## 2023-08-18 NOTE — Telephone Encounter (Signed)
 D/w pt about cramping at rest w/o claudication.  Mustard helped.  Leg cramping at night.  D/w pt about inc fluid intake and then update me as needed.  She agreed.

## 2023-08-23 ENCOUNTER — Encounter: Attending: Physician Assistant | Admitting: Physician Assistant

## 2023-08-23 DIAGNOSIS — I87331 Chronic venous hypertension (idiopathic) with ulcer and inflammation of right lower extremity: Secondary | ICD-10-CM | POA: Insufficient documentation

## 2023-08-23 DIAGNOSIS — L97312 Non-pressure chronic ulcer of right ankle with fat layer exposed: Secondary | ICD-10-CM | POA: Diagnosis not present

## 2023-08-23 DIAGNOSIS — L950 Livedoid vasculitis: Secondary | ICD-10-CM | POA: Insufficient documentation

## 2023-08-30 ENCOUNTER — Encounter: Admitting: Physician Assistant

## 2023-08-30 DIAGNOSIS — L950 Livedoid vasculitis: Secondary | ICD-10-CM | POA: Diagnosis not present

## 2023-08-30 DIAGNOSIS — I87331 Chronic venous hypertension (idiopathic) with ulcer and inflammation of right lower extremity: Secondary | ICD-10-CM | POA: Diagnosis not present

## 2023-08-30 DIAGNOSIS — L97312 Non-pressure chronic ulcer of right ankle with fat layer exposed: Secondary | ICD-10-CM | POA: Diagnosis not present

## 2023-08-30 DIAGNOSIS — H353211 Exudative age-related macular degeneration, right eye, with active choroidal neovascularization: Secondary | ICD-10-CM | POA: Diagnosis not present

## 2023-09-08 ENCOUNTER — Other Ambulatory Visit: Payer: Self-pay | Admitting: Family Medicine

## 2023-09-13 ENCOUNTER — Encounter: Admitting: Internal Medicine

## 2023-09-13 DIAGNOSIS — I87331 Chronic venous hypertension (idiopathic) with ulcer and inflammation of right lower extremity: Secondary | ICD-10-CM | POA: Diagnosis not present

## 2023-09-13 DIAGNOSIS — L950 Livedoid vasculitis: Secondary | ICD-10-CM | POA: Diagnosis not present

## 2023-09-13 DIAGNOSIS — L97312 Non-pressure chronic ulcer of right ankle with fat layer exposed: Secondary | ICD-10-CM | POA: Diagnosis not present

## 2023-09-20 ENCOUNTER — Encounter: Attending: Physician Assistant | Admitting: Physician Assistant

## 2023-09-20 DIAGNOSIS — L97312 Non-pressure chronic ulcer of right ankle with fat layer exposed: Secondary | ICD-10-CM | POA: Diagnosis not present

## 2023-09-20 DIAGNOSIS — I87331 Chronic venous hypertension (idiopathic) with ulcer and inflammation of right lower extremity: Secondary | ICD-10-CM | POA: Insufficient documentation

## 2023-09-20 DIAGNOSIS — L950 Livedoid vasculitis: Secondary | ICD-10-CM | POA: Diagnosis not present

## 2023-09-27 ENCOUNTER — Encounter: Admitting: Physician Assistant

## 2023-09-27 DIAGNOSIS — L97312 Non-pressure chronic ulcer of right ankle with fat layer exposed: Secondary | ICD-10-CM | POA: Diagnosis not present

## 2023-09-27 DIAGNOSIS — I87331 Chronic venous hypertension (idiopathic) with ulcer and inflammation of right lower extremity: Secondary | ICD-10-CM | POA: Diagnosis not present

## 2023-10-04 ENCOUNTER — Encounter: Admitting: Physician Assistant

## 2023-10-04 DIAGNOSIS — L97312 Non-pressure chronic ulcer of right ankle with fat layer exposed: Secondary | ICD-10-CM | POA: Diagnosis not present

## 2023-10-04 DIAGNOSIS — L950 Livedoid vasculitis: Secondary | ICD-10-CM | POA: Diagnosis not present

## 2023-10-04 DIAGNOSIS — I87331 Chronic venous hypertension (idiopathic) with ulcer and inflammation of right lower extremity: Secondary | ICD-10-CM | POA: Diagnosis not present

## 2023-10-05 ENCOUNTER — Encounter: Payer: Self-pay | Admitting: Family Medicine

## 2023-10-05 ENCOUNTER — Ambulatory Visit: Payer: Self-pay | Admitting: Family Medicine

## 2023-10-05 DIAGNOSIS — Z1231 Encounter for screening mammogram for malignant neoplasm of breast: Secondary | ICD-10-CM | POA: Diagnosis not present

## 2023-10-05 LAB — HM MAMMOGRAPHY

## 2023-10-10 ENCOUNTER — Encounter: Admitting: Physician Assistant

## 2023-10-10 DIAGNOSIS — L950 Livedoid vasculitis: Secondary | ICD-10-CM | POA: Diagnosis not present

## 2023-10-10 DIAGNOSIS — I87331 Chronic venous hypertension (idiopathic) with ulcer and inflammation of right lower extremity: Secondary | ICD-10-CM | POA: Diagnosis not present

## 2023-10-10 DIAGNOSIS — L97312 Non-pressure chronic ulcer of right ankle with fat layer exposed: Secondary | ICD-10-CM | POA: Diagnosis not present

## 2023-10-11 ENCOUNTER — Ambulatory Visit: Admitting: Physician Assistant

## 2023-10-17 DIAGNOSIS — H353211 Exudative age-related macular degeneration, right eye, with active choroidal neovascularization: Secondary | ICD-10-CM | POA: Diagnosis not present

## 2023-10-23 ENCOUNTER — Other Ambulatory Visit: Payer: Self-pay | Admitting: Family Medicine

## 2023-10-23 ENCOUNTER — Encounter: Payer: Self-pay | Admitting: Family Medicine

## 2023-10-23 DIAGNOSIS — E782 Mixed hyperlipidemia: Secondary | ICD-10-CM

## 2023-10-23 DIAGNOSIS — G2581 Restless legs syndrome: Secondary | ICD-10-CM

## 2023-10-23 DIAGNOSIS — R739 Hyperglycemia, unspecified: Secondary | ICD-10-CM

## 2023-10-23 DIAGNOSIS — K219 Gastro-esophageal reflux disease without esophagitis: Secondary | ICD-10-CM

## 2023-10-23 DIAGNOSIS — E039 Hypothyroidism, unspecified: Secondary | ICD-10-CM

## 2023-10-23 DIAGNOSIS — E559 Vitamin D deficiency, unspecified: Secondary | ICD-10-CM

## 2023-10-24 ENCOUNTER — Encounter: Attending: Physician Assistant | Admitting: Physician Assistant

## 2023-10-24 DIAGNOSIS — L97312 Non-pressure chronic ulcer of right ankle with fat layer exposed: Secondary | ICD-10-CM | POA: Insufficient documentation

## 2023-10-24 DIAGNOSIS — L950 Livedoid vasculitis: Secondary | ICD-10-CM | POA: Diagnosis not present

## 2023-10-24 DIAGNOSIS — I87331 Chronic venous hypertension (idiopathic) with ulcer and inflammation of right lower extremity: Secondary | ICD-10-CM | POA: Insufficient documentation

## 2023-10-25 ENCOUNTER — Other Ambulatory Visit (INDEPENDENT_AMBULATORY_CARE_PROVIDER_SITE_OTHER)

## 2023-10-25 DIAGNOSIS — R739 Hyperglycemia, unspecified: Secondary | ICD-10-CM | POA: Diagnosis not present

## 2023-10-25 DIAGNOSIS — E782 Mixed hyperlipidemia: Secondary | ICD-10-CM

## 2023-10-25 DIAGNOSIS — E039 Hypothyroidism, unspecified: Secondary | ICD-10-CM | POA: Diagnosis not present

## 2023-10-25 DIAGNOSIS — E559 Vitamin D deficiency, unspecified: Secondary | ICD-10-CM

## 2023-10-25 DIAGNOSIS — G2581 Restless legs syndrome: Secondary | ICD-10-CM

## 2023-10-25 LAB — CBC WITH DIFFERENTIAL/PLATELET
Basophils Absolute: 0.1 K/uL (ref 0.0–0.1)
Basophils Relative: 1.2 % (ref 0.0–3.0)
Eosinophils Absolute: 0.2 K/uL (ref 0.0–0.7)
Eosinophils Relative: 4 % (ref 0.0–5.0)
HCT: 39.6 % (ref 36.0–46.0)
Hemoglobin: 13.2 g/dL (ref 12.0–15.0)
Lymphocytes Relative: 27.2 % (ref 12.0–46.0)
Lymphs Abs: 1.4 K/uL (ref 0.7–4.0)
MCHC: 33.3 g/dL (ref 30.0–36.0)
MCV: 97.1 fl (ref 78.0–100.0)
Monocytes Absolute: 0.6 K/uL (ref 0.1–1.0)
Monocytes Relative: 10.5 % (ref 3.0–12.0)
Neutro Abs: 3 K/uL (ref 1.4–7.7)
Neutrophils Relative %: 57.1 % (ref 43.0–77.0)
Platelets: 248 K/uL (ref 150.0–400.0)
RBC: 4.07 Mil/uL (ref 3.87–5.11)
RDW: 13.1 % (ref 11.5–15.5)
WBC: 5.2 K/uL (ref 4.0–10.5)

## 2023-10-25 LAB — LIPID PANEL
Cholesterol: 179 mg/dL (ref 0–200)
HDL: 44 mg/dL (ref >39.00)
LDL Cholesterol: 92 mg/dL (ref 0–99)
NonHDL: 135.06
Total CHOL/HDL Ratio: 4
Triglycerides: 215 mg/dL — ABNORMAL HIGH (ref 0.0–149.0)
VLDL: 43 mg/dL — ABNORMAL HIGH (ref 0.0–40.0)

## 2023-10-25 LAB — COMPREHENSIVE METABOLIC PANEL WITH GFR
ALT: 38 U/L — ABNORMAL HIGH (ref 0–35)
AST: 35 U/L (ref 0–37)
Albumin: 4.2 g/dL (ref 3.5–5.2)
Alkaline Phosphatase: 110 U/L (ref 39–117)
BUN: 12 mg/dL (ref 6–23)
CO2: 30 meq/L (ref 19–32)
Calcium: 9.3 mg/dL (ref 8.4–10.5)
Chloride: 103 meq/L (ref 96–112)
Creatinine, Ser: 0.53 mg/dL (ref 0.40–1.20)
GFR: 85.75 mL/min (ref >60.00)
Glucose, Bld: 102 mg/dL — ABNORMAL HIGH (ref 70–99)
Potassium: 4.7 meq/L (ref 3.5–5.1)
Sodium: 140 meq/L (ref 135–145)
Total Bilirubin: 0.5 mg/dL (ref 0.2–1.2)
Total Protein: 6.6 g/dL (ref 6.0–8.3)

## 2023-10-25 LAB — VITAMIN B12: Vitamin B-12: 512 pg/mL (ref 211–911)

## 2023-10-25 LAB — HEMOGLOBIN A1C: Hgb A1c MFr Bld: 6.1 % (ref 4.6–6.5)

## 2023-10-25 LAB — VITAMIN D 25 HYDROXY (VIT D DEFICIENCY, FRACTURES): VITD: 36.47 ng/mL (ref 30.00–100.00)

## 2023-10-25 LAB — TSH: TSH: 0.73 u[IU]/mL (ref 0.35–5.50)

## 2023-10-26 ENCOUNTER — Ambulatory Visit: Payer: Self-pay | Admitting: Family Medicine

## 2023-10-27 ENCOUNTER — Ambulatory Visit

## 2023-11-01 ENCOUNTER — Ambulatory Visit: Admitting: Family Medicine

## 2023-11-01 ENCOUNTER — Telehealth: Payer: Self-pay

## 2023-11-01 ENCOUNTER — Encounter: Payer: Self-pay | Admitting: Family Medicine

## 2023-11-01 VITALS — BP 124/64 | HR 68 | Temp 98.5°F | Ht 67.8 in | Wt 210.4 lb

## 2023-11-01 DIAGNOSIS — F419 Anxiety disorder, unspecified: Secondary | ICD-10-CM | POA: Diagnosis not present

## 2023-11-01 DIAGNOSIS — Z23 Encounter for immunization: Secondary | ICD-10-CM | POA: Diagnosis not present

## 2023-11-01 DIAGNOSIS — Z7189 Other specified counseling: Secondary | ICD-10-CM

## 2023-11-01 DIAGNOSIS — Z0000 Encounter for general adult medical examination without abnormal findings: Secondary | ICD-10-CM

## 2023-11-01 DIAGNOSIS — G2581 Restless legs syndrome: Secondary | ICD-10-CM

## 2023-11-01 DIAGNOSIS — E782 Mixed hyperlipidemia: Secondary | ICD-10-CM | POA: Diagnosis not present

## 2023-11-01 DIAGNOSIS — E039 Hypothyroidism, unspecified: Secondary | ICD-10-CM

## 2023-11-01 MED ORDER — LEVOTHYROXINE SODIUM 100 MCG PO TABS: ORAL_TABLET | ORAL | 3 refills | Status: DC

## 2023-11-01 MED ORDER — ESCITALOPRAM OXALATE 5 MG PO TABS: 5.0000 mg | ORAL_TABLET | Freq: Every day | ORAL | 3 refills | Status: AC

## 2023-11-01 MED ORDER — ATENOLOL 25 MG PO TABS: 25.0000 mg | ORAL_TABLET | Freq: Every day | ORAL | 3 refills | Status: AC

## 2023-11-01 MED ORDER — ROPINIROLE HCL 0.5 MG PO TABS: ORAL_TABLET | ORAL | 0 refills | Status: AC

## 2023-11-01 NOTE — Telephone Encounter (Signed)
 Return call to [place] at the pharmacy to verify the prescription.

## 2023-11-01 NOTE — Telephone Encounter (Signed)
 She has only been taking 2 mg a day.  She was prev on 2mg  BID.    She is going to continue the 2mg  dose in the AM and add on 0.5mg  to gradually increase back to 2mg  BID.   Thanks.

## 2023-11-01 NOTE — Patient Instructions (Addendum)
 Take extra requip  at night.   0.5mg  - 1 tab at night for 1 week.  Then 2 tabs at night for 1 week.  Then 3 tabs at night for 1 week.   Then start taking 2 mg twice a day.    Let me know if this isn't helping. Take care.  Glad to see you.

## 2023-11-01 NOTE — Progress Notes (Unsigned)
 Flu 2025 Shingles prev done.  PNA prev done.  Tetanus 2015 RSV prev done.  Covid vaccine d/w pt.  Colon cancer screening not due. Age >13.  Breast cancer screening 2025 DXA 2020.  Advance directive- husband designated if patient were incapacitated.    Elevated Cholesterol: Using medications without problems:yes Muscle aches: not likely from statin.  Diet compliance: d/w pt  Exercise: d/w pt.   Labs d/w pt.    Anxiety, on lexapro .  Taking valium  rarely.  No ADE on med.  Anxiety improved with lexapro .  No SI/HI.    Hypothyroidism.  Compliant.  TSH wnl.  No dysphagia except for rare events, if eating in a hurry.  She has noted episodic sweats w/o fevers.  No weight loss.    Taking gabapentin  and requip  at baseline.  H/o RLS.  Sx worse with 2mg  requip  per day, down from prior 4mg  per day.  She still has foot numbness at baseline. Still on iron  every other day.    D/w pt about taking 2mg  requip  with 0.5mg  inc per week.    0.5mg  - 1 tab at night for 1 week.  Then 2 tabs at night for 1 week.  Then 3 tabs at night for 1 week.   Then start taking 2 mg twice a day.    Meds, vitals, and allergies reviewed.   ROS: Per HPI unless specifically indicated in ROS section   GEN: nad, alert and oriented HEENT: mucous membranes moist NECK: supple w/o LA CV: rrr. PULM: ctab, no inc wob ABD: soft, +bs EXT: no edema SKIN: Well-perfused. 30 minutes were devoted to patient care in this encounter (this includes time spent reviewing the patient's file/history, interviewing and examining the patient, counseling/reviewing plan with patient).

## 2023-11-01 NOTE — Telephone Encounter (Signed)
 Copied from CRM [id number]. Topic: Clinical - Medication Question >> Nov 01, 2023  1:22 PM Leah C wrote: Reason for CRM:  rOPINIRole  (REQUIP ) 2 MG tablet. Patient already has a prescription for 2 mg twice a day. [facility] was sent the refill this morning, and they aren't sure what's going on with this medication.   [phone]

## 2023-11-02 DIAGNOSIS — Z0000 Encounter for general adult medical examination without abnormal findings: Secondary | ICD-10-CM | POA: Insufficient documentation

## 2023-11-02 NOTE — Assessment & Plan Note (Signed)
Advance directive- husband designated if patient were incapacitated.  

## 2023-11-02 NOTE — Assessment & Plan Note (Signed)
 Flu 2025 Shingles prev done.  PNA prev done.  Tetanus 2015 RSV prev done.  Covid vaccine d/w pt.  Colon cancer screening not due. Age >78.  Breast cancer screening 2025 DXA 2020.  Advance directive- husband designated if patient were incapacitated.

## 2023-11-02 NOTE — Assessment & Plan Note (Signed)
 Would continue as is on lexapro .  Taking valium  rarely.  No ADE on med.  Anxiety improved with lexapro .  No SI/HI.

## 2023-11-02 NOTE — Assessment & Plan Note (Signed)
Continue simvastatin.  Labs discussed with patient. 

## 2023-11-02 NOTE — Assessment & Plan Note (Signed)
 RLS.  Sx worse with 2mg  requip  per day, down from prior 4mg  per day.  She still has foot numbness at baseline. Still on iron  every other day.    D/w pt about taking 2mg  requip  with 0.5mg  inc per week.    0.5mg  - 1 tab at night for 1 week.  Then 2 tabs at night for 1 week.  Then 3 tabs at night for 1 week.   Then start taking 2 mg twice a day.  She can update me as needed.

## 2023-11-02 NOTE — Assessment & Plan Note (Signed)
Continue levothyroxine as is. 

## 2023-11-07 DIAGNOSIS — H353221 Exudative age-related macular degeneration, left eye, with active choroidal neovascularization: Secondary | ICD-10-CM | POA: Diagnosis not present

## 2023-11-20 ENCOUNTER — Other Ambulatory Visit: Payer: Self-pay | Admitting: Family Medicine

## 2023-12-02 ENCOUNTER — Other Ambulatory Visit: Payer: Self-pay | Admitting: Family Medicine

## 2023-12-14 ENCOUNTER — Other Ambulatory Visit: Payer: Self-pay | Admitting: Family Medicine

## 2023-12-14 DIAGNOSIS — M479 Spondylosis, unspecified: Secondary | ICD-10-CM

## 2023-12-14 NOTE — Telephone Encounter (Signed)
 Ibuprofen  Last filled:  05/18/22, #180 Last OV:  11/01/23, annual exam Next OV:  none

## 2023-12-25 ENCOUNTER — Other Ambulatory Visit: Payer: Self-pay | Admitting: Family Medicine

## 2024-01-23 ENCOUNTER — Other Ambulatory Visit: Payer: Self-pay | Admitting: Family Medicine

## 2024-01-24 MED ORDER — GABAPENTIN 300 MG PO CAPS: ORAL_CAPSULE | ORAL | 5 refills | Status: AC

## 2024-01-24 NOTE — Addendum Note (Signed)
 Addended by: CLEATUS LORELI RAMAN on: 01/24/2024 02:12 PM   Modules accepted: Orders

## 2024-01-24 NOTE — Telephone Encounter (Signed)
 I don't see where it was denied.  I didn't deny it.  Please check on that.   I resent it today.  Let us  know if she can't get it filled.    Thanks.

## 2024-01-24 NOTE — Telephone Encounter (Unsigned)
 Copied from CRM [id number]. Topic: Clinical - Medication Question >> Jan 24, 2024 10:33 AM Revonda D wrote: Reason for CRM: Pt's husband is requesting to speak with Dr.Duncan in regards to the gabapentin . He stated that the pt is low on this medication and needs a refill. They are confused as to why the medication was refused and would like a callback today if possible. CB [phone]

## 2024-01-29 ENCOUNTER — Other Ambulatory Visit: Payer: Self-pay | Admitting: Family Medicine

## 2024-01-29 DIAGNOSIS — K219 Gastro-esophageal reflux disease without esophagitis: Secondary | ICD-10-CM
